# Patient Record
Sex: Male | Born: 1975
Health system: Southern US, Community
[De-identification: ages and names within clinical notes are randomized; demographics above are authoritative.]

## PROBLEM LIST (undated history)

## (undated) DIAGNOSIS — J45909 Unspecified asthma, uncomplicated: Secondary | ICD-10-CM

## (undated) DIAGNOSIS — K219 Gastro-esophageal reflux disease without esophagitis: Secondary | ICD-10-CM

## (undated) DIAGNOSIS — F319 Bipolar disorder, unspecified: Secondary | ICD-10-CM

## (undated) DIAGNOSIS — T8859XA Other complications of anesthesia, initial encounter: Secondary | ICD-10-CM

## (undated) DIAGNOSIS — M549 Dorsalgia, unspecified: Secondary | ICD-10-CM

## (undated) DIAGNOSIS — F419 Anxiety disorder, unspecified: Secondary | ICD-10-CM

## (undated) DIAGNOSIS — F259 Schizoaffective disorder, unspecified: Secondary | ICD-10-CM

## (undated) DIAGNOSIS — T4145XA Adverse effect of unspecified anesthetic, initial encounter: Secondary | ICD-10-CM

## (undated) DIAGNOSIS — R0689 Other abnormalities of breathing: Secondary | ICD-10-CM

## (undated) DIAGNOSIS — M5416 Radiculopathy, lumbar region: Secondary | ICD-10-CM

## (undated) DIAGNOSIS — N4400 Torsion of testis, unspecified: Secondary | ICD-10-CM

## (undated) DIAGNOSIS — E785 Hyperlipidemia, unspecified: Secondary | ICD-10-CM

## (undated) DIAGNOSIS — G43909 Migraine, unspecified, not intractable, without status migrainosus: Secondary | ICD-10-CM

## (undated) DIAGNOSIS — G8929 Other chronic pain: Secondary | ICD-10-CM

## (undated) HISTORY — PX: APPENDECTOMY: SHX54

## (undated) HISTORY — PX: ANKLE SURGERY: SHX546

## (undated) HISTORY — PX: UMBILICAL HERNIA REPAIR: SHX196

## (undated) HISTORY — DX: Other abnormalities of breathing: R06.89

## (undated) HISTORY — PX: CHOLECYSTECTOMY: SHX55

## (undated) HISTORY — DX: Schizoaffective disorder, unspecified: F25.9

---

## 2001-03-26 ENCOUNTER — Observation Stay (HOSPITAL_COMMUNITY): Admission: EM | Admit: 2001-03-26 | Discharge: 2001-03-27 | Payer: Self-pay | Admitting: General Surgery

## 2001-03-26 ENCOUNTER — Encounter: Payer: Self-pay | Admitting: General Surgery

## 2001-03-27 ENCOUNTER — Encounter: Payer: Self-pay | Admitting: General Surgery

## 2001-04-08 ENCOUNTER — Encounter: Payer: Self-pay | Admitting: Family Medicine

## 2001-04-08 ENCOUNTER — Ambulatory Visit (HOSPITAL_COMMUNITY): Admission: RE | Admit: 2001-04-08 | Discharge: 2001-04-08 | Payer: Self-pay | Admitting: Family Medicine

## 2001-04-14 ENCOUNTER — Encounter: Payer: Self-pay | Admitting: Family Medicine

## 2001-04-20 ENCOUNTER — Ambulatory Visit (HOSPITAL_COMMUNITY): Admission: RE | Admit: 2001-04-20 | Discharge: 2001-04-20 | Payer: Self-pay | Admitting: Family Medicine

## 2001-05-27 ENCOUNTER — Emergency Department (HOSPITAL_COMMUNITY): Admission: EM | Admit: 2001-05-27 | Discharge: 2001-05-27 | Payer: Self-pay | Admitting: Emergency Medicine

## 2001-09-05 ENCOUNTER — Emergency Department (HOSPITAL_COMMUNITY): Admission: EM | Admit: 2001-09-05 | Discharge: 2001-09-05 | Payer: Self-pay | Admitting: Emergency Medicine

## 2001-09-06 ENCOUNTER — Emergency Department (HOSPITAL_COMMUNITY): Admission: EM | Admit: 2001-09-06 | Discharge: 2001-09-06 | Payer: Self-pay

## 2001-10-05 ENCOUNTER — Inpatient Hospital Stay (HOSPITAL_COMMUNITY): Admission: EM | Admit: 2001-10-05 | Discharge: 2001-10-06 | Payer: Self-pay | Admitting: Emergency Medicine

## 2001-10-05 ENCOUNTER — Encounter: Payer: Self-pay | Admitting: Emergency Medicine

## 2001-10-12 ENCOUNTER — Emergency Department (HOSPITAL_COMMUNITY): Admission: EM | Admit: 2001-10-12 | Discharge: 2001-10-12 | Payer: Self-pay | Admitting: Emergency Medicine

## 2001-10-12 ENCOUNTER — Emergency Department (HOSPITAL_COMMUNITY): Admission: EM | Admit: 2001-10-12 | Discharge: 2001-10-13 | Payer: Self-pay | Admitting: Emergency Medicine

## 2001-10-13 ENCOUNTER — Inpatient Hospital Stay (HOSPITAL_COMMUNITY): Admission: EM | Admit: 2001-10-13 | Discharge: 2001-10-23 | Payer: Self-pay | Admitting: Emergency Medicine

## 2001-10-13 ENCOUNTER — Encounter: Payer: Self-pay | Admitting: Emergency Medicine

## 2001-10-15 ENCOUNTER — Encounter: Payer: Self-pay | Admitting: Family Medicine

## 2001-10-16 ENCOUNTER — Encounter: Payer: Self-pay | Admitting: Internal Medicine

## 2001-10-17 ENCOUNTER — Encounter: Payer: Self-pay | Admitting: Family Medicine

## 2001-10-24 ENCOUNTER — Inpatient Hospital Stay (HOSPITAL_COMMUNITY): Admission: EM | Admit: 2001-10-24 | Discharge: 2001-10-26 | Payer: Self-pay | Admitting: Emergency Medicine

## 2001-10-24 ENCOUNTER — Encounter: Payer: Self-pay | Admitting: Internal Medicine

## 2002-12-30 ENCOUNTER — Encounter: Payer: Self-pay | Admitting: *Deleted

## 2002-12-30 ENCOUNTER — Emergency Department (HOSPITAL_COMMUNITY): Admission: EM | Admit: 2002-12-30 | Discharge: 2002-12-30 | Payer: Self-pay | Admitting: Emergency Medicine

## 2003-01-04 ENCOUNTER — Emergency Department (HOSPITAL_COMMUNITY): Admission: EM | Admit: 2003-01-04 | Discharge: 2003-01-04 | Payer: Self-pay | Admitting: Emergency Medicine

## 2003-01-04 ENCOUNTER — Encounter: Payer: Self-pay | Admitting: Emergency Medicine

## 2007-03-05 ENCOUNTER — Encounter: Payer: Self-pay | Admitting: Orthopedic Surgery

## 2007-10-02 ENCOUNTER — Encounter: Payer: Self-pay | Admitting: Orthopedic Surgery

## 2007-12-23 ENCOUNTER — Encounter: Payer: Self-pay | Admitting: Orthopedic Surgery

## 2007-12-23 ENCOUNTER — Emergency Department (HOSPITAL_COMMUNITY): Admission: EM | Admit: 2007-12-23 | Discharge: 2007-12-23 | Payer: Self-pay | Admitting: Emergency Medicine

## 2007-12-31 ENCOUNTER — Ambulatory Visit: Payer: Self-pay | Admitting: Orthopedic Surgery

## 2008-01-07 ENCOUNTER — Ambulatory Visit: Payer: Self-pay | Admitting: Family Medicine

## 2008-01-07 DIAGNOSIS — J309 Allergic rhinitis, unspecified: Secondary | ICD-10-CM | POA: Insufficient documentation

## 2008-01-07 DIAGNOSIS — I10 Essential (primary) hypertension: Secondary | ICD-10-CM | POA: Insufficient documentation

## 2008-01-07 DIAGNOSIS — K589 Irritable bowel syndrome without diarrhea: Secondary | ICD-10-CM

## 2008-01-07 DIAGNOSIS — E782 Mixed hyperlipidemia: Secondary | ICD-10-CM

## 2008-01-07 DIAGNOSIS — F17200 Nicotine dependence, unspecified, uncomplicated: Secondary | ICD-10-CM

## 2008-01-07 DIAGNOSIS — F341 Dysthymic disorder: Secondary | ICD-10-CM | POA: Insufficient documentation

## 2008-01-07 LAB — CONVERTED CEMR LAB
Blood in Urine, dipstick: NEGATIVE
Ketones, urine, test strip: NEGATIVE
LDL Goal: 130 mg/dL
Nitrite: NEGATIVE
Protein, U semiquant: NEGATIVE
WBC Urine, dipstick: NEGATIVE

## 2008-01-08 LAB — CONVERTED CEMR LAB
AST: 24 units/L (ref 0–37)
Alkaline Phosphatase: 109 units/L (ref 39–117)
Basophils Absolute: 0 10*3/uL (ref 0.0–0.1)
Eosinophils Absolute: 0.3 10*3/uL (ref 0.0–0.7)
Eosinophils Relative: 3 % (ref 0–5)
Glucose, Bld: 103 mg/dL — ABNORMAL HIGH (ref 70–99)
HCT: 45.2 % (ref 39.0–52.0)
HDL: 31 mg/dL — ABNORMAL LOW (ref >39)
Hemoglobin: 15.3 g/dL (ref 13.0–17.0)
LDL Cholesterol: 136 mg/dL — ABNORMAL HIGH (ref 0–99)
MCV: 85.9 fL (ref 78.0–100.0)
Neutrophils Relative %: 63 % (ref 43–77)
Platelets: 225 10*3/uL (ref 150–400)
RBC: 5.26 M/uL (ref 4.22–5.81)
RDW: 13.8 % (ref 11.5–15.5)
Sodium: 139 meq/L (ref 135–145)
TSH: 1.461 microintl units/mL (ref 0.350–5.50)
Total Bilirubin: 0.6 mg/dL (ref 0.3–1.2)
Total Protein: 6.7 g/dL (ref 6.0–8.3)

## 2008-01-21 ENCOUNTER — Ambulatory Visit: Payer: Self-pay | Admitting: Family Medicine

## 2008-01-25 ENCOUNTER — Telehealth (INDEPENDENT_AMBULATORY_CARE_PROVIDER_SITE_OTHER): Payer: Self-pay | Admitting: *Deleted

## 2008-02-09 ENCOUNTER — Telehealth (INDEPENDENT_AMBULATORY_CARE_PROVIDER_SITE_OTHER): Payer: Self-pay | Admitting: Family Medicine

## 2008-02-12 ENCOUNTER — Ambulatory Visit (HOSPITAL_COMMUNITY): Admission: RE | Admit: 2008-02-12 | Discharge: 2008-02-12 | Payer: Self-pay | Admitting: Family Medicine

## 2008-02-12 ENCOUNTER — Ambulatory Visit: Payer: Self-pay | Admitting: Family Medicine

## 2008-02-12 LAB — CONVERTED CEMR LAB
Amylase: 45 units/L (ref 27–131)
Bilirubin Urine: NEGATIVE
CO2: 23 meq/L (ref 19–32)
Creatinine, Ser: 0.88 mg/dL (ref 0.40–1.50)
Eosinophils Relative: 1 % (ref 0–5)
Glucose, Bld: 122 mg/dL — ABNORMAL HIGH (ref 70–99)
HCT: 46.5 % (ref 39.0–52.0)
Ketones, urine, test strip: NEGATIVE
Lipase: 21 units/L (ref 11–59)
Lymphocytes Relative: 29 % (ref 12–46)
Lymphs Abs: 3.7 10*3/uL (ref 0.7–4.0)
Monocytes Absolute: 0.9 10*3/uL (ref 0.1–1.0)
OCCULT 1: NEGATIVE
Platelets: 263 10*3/uL (ref 150–400)
RDW: 14.1 % (ref 11.5–15.5)
Specific Gravity, Urine: <1.005
Total Bilirubin: 1.2 mg/dL (ref 0.3–1.2)
WBC: 12.6 10*3/uL — ABNORMAL HIGH (ref 4.0–10.5)

## 2008-02-15 ENCOUNTER — Ambulatory Visit: Payer: Self-pay | Admitting: Family Medicine

## 2008-02-16 ENCOUNTER — Telehealth (INDEPENDENT_AMBULATORY_CARE_PROVIDER_SITE_OTHER): Payer: Self-pay | Admitting: *Deleted

## 2008-03-03 ENCOUNTER — Ambulatory Visit: Payer: Self-pay | Admitting: Family Medicine

## 2008-03-04 ENCOUNTER — Ambulatory Visit: Payer: Self-pay | Admitting: Internal Medicine

## 2008-03-08 ENCOUNTER — Telehealth (INDEPENDENT_AMBULATORY_CARE_PROVIDER_SITE_OTHER): Payer: Self-pay | Admitting: *Deleted

## 2008-03-08 ENCOUNTER — Encounter (INDEPENDENT_AMBULATORY_CARE_PROVIDER_SITE_OTHER): Payer: Self-pay | Admitting: Family Medicine

## 2008-03-10 ENCOUNTER — Encounter (INDEPENDENT_AMBULATORY_CARE_PROVIDER_SITE_OTHER): Payer: Self-pay | Admitting: Family Medicine

## 2008-03-14 ENCOUNTER — Telehealth (INDEPENDENT_AMBULATORY_CARE_PROVIDER_SITE_OTHER): Payer: Self-pay | Admitting: *Deleted

## 2008-03-17 ENCOUNTER — Ambulatory Visit (HOSPITAL_COMMUNITY): Admission: RE | Admit: 2008-03-17 | Discharge: 2008-03-17 | Payer: Self-pay | Admitting: Internal Medicine

## 2008-03-17 ENCOUNTER — Ambulatory Visit: Payer: Self-pay | Admitting: Internal Medicine

## 2008-03-17 HISTORY — PX: ESOPHAGOGASTRODUODENOSCOPY: SHX1529

## 2008-03-23 ENCOUNTER — Ambulatory Visit (HOSPITAL_COMMUNITY): Admission: RE | Admit: 2008-03-23 | Discharge: 2008-03-23 | Payer: Self-pay | Admitting: Internal Medicine

## 2008-03-23 ENCOUNTER — Ambulatory Visit: Payer: Self-pay | Admitting: Family Medicine

## 2008-03-23 DIAGNOSIS — K449 Diaphragmatic hernia without obstruction or gangrene: Secondary | ICD-10-CM | POA: Insufficient documentation

## 2008-03-23 DIAGNOSIS — K210 Gastro-esophageal reflux disease with esophagitis: Secondary | ICD-10-CM

## 2008-03-29 ENCOUNTER — Telehealth (INDEPENDENT_AMBULATORY_CARE_PROVIDER_SITE_OTHER): Payer: Self-pay | Admitting: Family Medicine

## 2008-04-07 ENCOUNTER — Ambulatory Visit: Payer: Self-pay | Admitting: Internal Medicine

## 2008-04-07 ENCOUNTER — Encounter (INDEPENDENT_AMBULATORY_CARE_PROVIDER_SITE_OTHER): Payer: Self-pay | Admitting: Family Medicine

## 2008-04-15 ENCOUNTER — Emergency Department (HOSPITAL_COMMUNITY): Admission: EM | Admit: 2008-04-15 | Discharge: 2008-04-15 | Payer: Self-pay | Admitting: Emergency Medicine

## 2008-04-18 ENCOUNTER — Ambulatory Visit: Payer: Self-pay | Admitting: Family Medicine

## 2008-04-19 ENCOUNTER — Ambulatory Visit (HOSPITAL_COMMUNITY): Admission: RE | Admit: 2008-04-19 | Discharge: 2008-04-19 | Payer: Self-pay | Admitting: Family Medicine

## 2008-04-20 ENCOUNTER — Telehealth (INDEPENDENT_AMBULATORY_CARE_PROVIDER_SITE_OTHER): Payer: Self-pay | Admitting: *Deleted

## 2008-04-27 ENCOUNTER — Telehealth (INDEPENDENT_AMBULATORY_CARE_PROVIDER_SITE_OTHER): Payer: Self-pay | Admitting: Family Medicine

## 2008-05-20 ENCOUNTER — Encounter (INDEPENDENT_AMBULATORY_CARE_PROVIDER_SITE_OTHER): Payer: Self-pay | Admitting: Family Medicine

## 2008-05-27 ENCOUNTER — Ambulatory Visit: Payer: Self-pay | Admitting: Family Medicine

## 2008-05-27 DIAGNOSIS — Z8659 Personal history of other mental and behavioral disorders: Secondary | ICD-10-CM

## 2008-05-30 ENCOUNTER — Encounter (INDEPENDENT_AMBULATORY_CARE_PROVIDER_SITE_OTHER): Payer: Self-pay | Admitting: Family Medicine

## 2008-06-01 LAB — CONVERTED CEMR LAB
ALT: 34 units/L (ref 0–53)
AST: 18 units/L (ref 0–37)
CO2: 26 meq/L (ref 19–32)
Chloride: 100 meq/L (ref 96–112)
Creatinine, Ser: 0.84 mg/dL (ref 0.40–1.50)
HDL: 37 mg/dL — ABNORMAL LOW (ref >39)
LDL Cholesterol: 144 mg/dL — ABNORMAL HIGH (ref 0–99)
Sodium: 138 meq/L (ref 135–145)
Total CHOL/HDL Ratio: 6.4
VLDL: 55 mg/dL — ABNORMAL HIGH (ref 0–40)

## 2008-06-13 ENCOUNTER — Ambulatory Visit: Payer: Self-pay | Admitting: Family Medicine

## 2008-06-14 ENCOUNTER — Encounter (INDEPENDENT_AMBULATORY_CARE_PROVIDER_SITE_OTHER): Payer: Self-pay | Admitting: Family Medicine

## 2008-06-17 ENCOUNTER — Encounter (INDEPENDENT_AMBULATORY_CARE_PROVIDER_SITE_OTHER): Payer: Self-pay | Admitting: Family Medicine

## 2008-06-25 ENCOUNTER — Emergency Department (HOSPITAL_COMMUNITY): Admission: EM | Admit: 2008-06-25 | Discharge: 2008-06-25 | Payer: Self-pay | Admitting: Emergency Medicine

## 2008-07-01 ENCOUNTER — Telehealth (INDEPENDENT_AMBULATORY_CARE_PROVIDER_SITE_OTHER): Payer: Self-pay | Admitting: *Deleted

## 2008-07-07 ENCOUNTER — Emergency Department (HOSPITAL_COMMUNITY): Admission: EM | Admit: 2008-07-07 | Discharge: 2008-07-07 | Payer: Self-pay | Admitting: Emergency Medicine

## 2008-07-13 ENCOUNTER — Ambulatory Visit: Payer: Self-pay | Admitting: Family Medicine

## 2008-07-14 ENCOUNTER — Encounter (INDEPENDENT_AMBULATORY_CARE_PROVIDER_SITE_OTHER): Payer: Self-pay | Admitting: Family Medicine

## 2008-07-14 LAB — CONVERTED CEMR LAB
Alkaline Phosphatase: 123 units/L — ABNORMAL HIGH (ref 39–117)
Creatinine, Ser: 0.68 mg/dL (ref 0.40–1.50)
Glucose, Bld: 135 mg/dL — ABNORMAL HIGH (ref 70–99)
Sodium: 138 meq/L (ref 135–145)
Total Bilirubin: 0.5 mg/dL (ref 0.3–1.2)
Total Protein: 7.1 g/dL (ref 6.0–8.3)

## 2008-08-11 ENCOUNTER — Ambulatory Visit: Payer: Self-pay | Admitting: Family Medicine

## 2008-09-09 ENCOUNTER — Ambulatory Visit: Payer: Self-pay | Admitting: Family Medicine

## 2008-09-12 ENCOUNTER — Encounter (INDEPENDENT_AMBULATORY_CARE_PROVIDER_SITE_OTHER): Payer: Self-pay | Admitting: Family Medicine

## 2008-09-13 LAB — CONVERTED CEMR LAB
Albumin: 4.4 g/dL (ref 3.5–5.2)
Alkaline Phosphatase: 151 units/L — ABNORMAL HIGH (ref 39–117)
BUN: 9 mg/dL (ref 6–23)
Calcium: 9.1 mg/dL (ref 8.4–10.5)
Chloride: 103 meq/L (ref 96–112)
Cholesterol: 168 mg/dL (ref 0–200)
Creatinine, Ser: 0.73 mg/dL (ref 0.40–1.50)
Glucose, Bld: 101 mg/dL — ABNORMAL HIGH (ref 70–99)
HDL: 36 mg/dL — ABNORMAL LOW (ref >39)
Total Protein: 7.1 g/dL (ref 6.0–8.3)
Triglycerides: 414 mg/dL — ABNORMAL HIGH (ref <150)

## 2008-09-16 ENCOUNTER — Encounter: Payer: Self-pay | Admitting: Orthopedic Surgery

## 2008-09-16 ENCOUNTER — Ambulatory Visit (HOSPITAL_COMMUNITY): Admission: RE | Admit: 2008-09-16 | Discharge: 2008-09-16 | Payer: Self-pay | Admitting: Family Medicine

## 2008-09-16 ENCOUNTER — Ambulatory Visit: Payer: Self-pay | Admitting: Family Medicine

## 2008-09-16 DIAGNOSIS — K219 Gastro-esophageal reflux disease without esophagitis: Secondary | ICD-10-CM

## 2008-09-16 LAB — CONVERTED CEMR LAB
Amylase: 36 units/L (ref 27–131)
CO2: 22 meq/L (ref 19–32)
Calcium: 10.3 mg/dL (ref 8.4–10.5)
Chloride: 104 meq/L (ref 96–112)
Creatinine, Ser: 0.81 mg/dL (ref 0.40–1.20)
Glucose, Bld: 105 mg/dL — ABNORMAL HIGH (ref 70–99)
Lipase: 18 units/L (ref 11–59)
Total Bilirubin: 0.9 mg/dL (ref 0.3–1.2)

## 2008-09-20 ENCOUNTER — Ambulatory Visit: Payer: Self-pay | Admitting: Family Medicine

## 2008-09-21 LAB — CONVERTED CEMR LAB
Bilirubin, Direct: 0.1 mg/dL (ref 0.0–0.3)
Indirect Bilirubin: 0.3 mg/dL (ref 0.0–0.9)

## 2008-09-26 ENCOUNTER — Encounter: Payer: Self-pay | Admitting: Orthopedic Surgery

## 2008-09-26 ENCOUNTER — Telehealth (INDEPENDENT_AMBULATORY_CARE_PROVIDER_SITE_OTHER): Payer: Self-pay | Admitting: Family Medicine

## 2008-09-27 ENCOUNTER — Emergency Department (HOSPITAL_COMMUNITY): Admission: EM | Admit: 2008-09-27 | Discharge: 2008-09-27 | Payer: Self-pay | Admitting: Emergency Medicine

## 2008-09-27 ENCOUNTER — Telehealth: Payer: Self-pay | Admitting: Orthopedic Surgery

## 2008-09-28 ENCOUNTER — Encounter: Payer: Self-pay | Admitting: Orthopedic Surgery

## 2008-09-29 ENCOUNTER — Telehealth: Payer: Self-pay | Admitting: Orthopedic Surgery

## 2008-09-30 ENCOUNTER — Telehealth (INDEPENDENT_AMBULATORY_CARE_PROVIDER_SITE_OTHER): Payer: Self-pay | Admitting: *Deleted

## 2008-09-30 ENCOUNTER — Ambulatory Visit: Payer: Self-pay | Admitting: Family Medicine

## 2008-10-05 ENCOUNTER — Ambulatory Visit: Payer: Self-pay | Admitting: Orthopedic Surgery

## 2008-10-05 DIAGNOSIS — M7750 Other enthesopathy of unspecified foot: Secondary | ICD-10-CM

## 2008-10-11 ENCOUNTER — Telehealth (INDEPENDENT_AMBULATORY_CARE_PROVIDER_SITE_OTHER): Payer: Self-pay | Admitting: *Deleted

## 2008-10-14 ENCOUNTER — Encounter (INDEPENDENT_AMBULATORY_CARE_PROVIDER_SITE_OTHER): Payer: Self-pay | Admitting: *Deleted

## 2008-10-25 ENCOUNTER — Telehealth (INDEPENDENT_AMBULATORY_CARE_PROVIDER_SITE_OTHER): Payer: Self-pay | Admitting: *Deleted

## 2008-11-21 ENCOUNTER — Ambulatory Visit: Payer: Self-pay | Admitting: Family Medicine

## 2008-11-22 ENCOUNTER — Encounter: Payer: Self-pay | Admitting: Orthopedic Surgery

## 2009-01-09 ENCOUNTER — Telehealth (INDEPENDENT_AMBULATORY_CARE_PROVIDER_SITE_OTHER): Payer: Self-pay

## 2009-01-11 ENCOUNTER — Ambulatory Visit: Payer: Self-pay | Admitting: Family Medicine

## 2009-01-17 ENCOUNTER — Encounter: Payer: Self-pay | Admitting: Urgent Care

## 2009-01-17 ENCOUNTER — Encounter: Payer: Self-pay | Admitting: *Deleted

## 2009-01-17 ENCOUNTER — Ambulatory Visit: Payer: Self-pay | Admitting: Internal Medicine

## 2009-01-19 ENCOUNTER — Telehealth (INDEPENDENT_AMBULATORY_CARE_PROVIDER_SITE_OTHER): Payer: Self-pay | Admitting: Family Medicine

## 2009-02-01 ENCOUNTER — Telehealth (INDEPENDENT_AMBULATORY_CARE_PROVIDER_SITE_OTHER): Payer: Self-pay | Admitting: *Deleted

## 2009-02-17 ENCOUNTER — Ambulatory Visit: Payer: Self-pay | Admitting: Family Medicine

## 2009-02-17 DIAGNOSIS — F319 Bipolar disorder, unspecified: Secondary | ICD-10-CM

## 2009-02-20 ENCOUNTER — Telehealth (INDEPENDENT_AMBULATORY_CARE_PROVIDER_SITE_OTHER): Payer: Self-pay | Admitting: *Deleted

## 2009-02-28 ENCOUNTER — Emergency Department (HOSPITAL_COMMUNITY): Admission: EM | Admit: 2009-02-28 | Discharge: 2009-02-28 | Payer: Self-pay | Admitting: Emergency Medicine

## 2009-03-13 ENCOUNTER — Encounter (INDEPENDENT_AMBULATORY_CARE_PROVIDER_SITE_OTHER): Payer: Self-pay | Admitting: Family Medicine

## 2009-03-14 LAB — CONVERTED CEMR LAB
BUN: 9 mg/dL (ref 6–23)
CO2: 24 meq/L (ref 19–32)
Cholesterol: 231 mg/dL — ABNORMAL HIGH (ref 0–200)
Creatinine, Ser: 0.81 mg/dL (ref 0.40–1.50)
Glucose, Bld: 83 mg/dL (ref 70–99)
Potassium: 4.5 meq/L (ref 3.5–5.3)
Total Bilirubin: 0.5 mg/dL (ref 0.3–1.2)
Total CHOL/HDL Ratio: 5.6
Triglycerides: 210 mg/dL — ABNORMAL HIGH (ref <150)
VLDL: 42 mg/dL — ABNORMAL HIGH (ref 0–40)

## 2009-03-15 ENCOUNTER — Ambulatory Visit: Payer: Self-pay | Admitting: Family Medicine

## 2009-03-20 ENCOUNTER — Ambulatory Visit: Payer: Self-pay | Admitting: Family Medicine

## 2009-03-20 DIAGNOSIS — M545 Low back pain: Secondary | ICD-10-CM

## 2009-03-21 ENCOUNTER — Telehealth (INDEPENDENT_AMBULATORY_CARE_PROVIDER_SITE_OTHER): Payer: Self-pay | Admitting: Family Medicine

## 2009-03-28 ENCOUNTER — Ambulatory Visit: Payer: Self-pay | Admitting: Family Medicine

## 2009-03-31 ENCOUNTER — Telehealth (INDEPENDENT_AMBULATORY_CARE_PROVIDER_SITE_OTHER): Payer: Self-pay | Admitting: Family Medicine

## 2009-04-07 ENCOUNTER — Telehealth (INDEPENDENT_AMBULATORY_CARE_PROVIDER_SITE_OTHER): Payer: Self-pay | Admitting: Family Medicine

## 2009-04-11 ENCOUNTER — Emergency Department (HOSPITAL_COMMUNITY): Admission: EM | Admit: 2009-04-11 | Discharge: 2009-04-11 | Payer: Self-pay | Admitting: Emergency Medicine

## 2009-04-20 ENCOUNTER — Encounter (INDEPENDENT_AMBULATORY_CARE_PROVIDER_SITE_OTHER): Payer: Self-pay | Admitting: Family Medicine

## 2009-04-26 ENCOUNTER — Ambulatory Visit: Payer: Self-pay | Admitting: Family Medicine

## 2009-04-26 DIAGNOSIS — G4700 Insomnia, unspecified: Secondary | ICD-10-CM

## 2009-05-31 ENCOUNTER — Emergency Department (HOSPITAL_COMMUNITY): Admission: EM | Admit: 2009-05-31 | Discharge: 2009-05-31 | Payer: Self-pay | Admitting: Emergency Medicine

## 2009-06-04 ENCOUNTER — Emergency Department (HOSPITAL_COMMUNITY): Admission: EM | Admit: 2009-06-04 | Discharge: 2009-06-04 | Payer: Self-pay | Admitting: Emergency Medicine

## 2009-06-07 ENCOUNTER — Other Ambulatory Visit: Payer: Self-pay | Admitting: Emergency Medicine

## 2009-06-08 ENCOUNTER — Inpatient Hospital Stay (HOSPITAL_COMMUNITY): Admission: RE | Admit: 2009-06-08 | Discharge: 2009-06-19 | Payer: Self-pay | Admitting: Psychiatry

## 2009-06-08 ENCOUNTER — Ambulatory Visit: Payer: Self-pay | Admitting: Psychiatry

## 2009-06-23 ENCOUNTER — Emergency Department (HOSPITAL_COMMUNITY): Admission: EM | Admit: 2009-06-23 | Discharge: 2009-06-23 | Payer: Self-pay | Admitting: Emergency Medicine

## 2009-07-04 ENCOUNTER — Encounter: Admission: RE | Admit: 2009-07-04 | Discharge: 2009-07-04 | Payer: Self-pay | Admitting: Orthopedic Surgery

## 2009-07-21 ENCOUNTER — Emergency Department (HOSPITAL_COMMUNITY): Admission: EM | Admit: 2009-07-21 | Discharge: 2009-07-21 | Payer: Self-pay | Admitting: Family Medicine

## 2009-07-26 ENCOUNTER — Ambulatory Visit (HOSPITAL_BASED_OUTPATIENT_CLINIC_OR_DEPARTMENT_OTHER): Admission: RE | Admit: 2009-07-26 | Discharge: 2009-07-26 | Payer: Self-pay | Admitting: Orthopedic Surgery

## 2009-08-04 ENCOUNTER — Encounter: Payer: Self-pay | Admitting: Orthopedic Surgery

## 2009-09-07 ENCOUNTER — Encounter: Payer: Self-pay | Admitting: Orthopedic Surgery

## 2009-10-21 ENCOUNTER — Emergency Department (HOSPITAL_COMMUNITY): Admission: EM | Admit: 2009-10-21 | Discharge: 2009-10-21 | Payer: Self-pay | Admitting: Emergency Medicine

## 2009-11-02 ENCOUNTER — Emergency Department (HOSPITAL_COMMUNITY): Admission: EM | Admit: 2009-11-02 | Discharge: 2009-11-02 | Payer: Self-pay | Admitting: Emergency Medicine

## 2009-11-05 ENCOUNTER — Emergency Department (HOSPITAL_COMMUNITY): Admission: EM | Admit: 2009-11-05 | Discharge: 2009-11-05 | Payer: Self-pay | Admitting: Emergency Medicine

## 2009-11-18 ENCOUNTER — Inpatient Hospital Stay (HOSPITAL_COMMUNITY): Admission: AC | Admit: 2009-11-18 | Discharge: 2009-11-19 | Payer: Self-pay

## 2009-11-20 ENCOUNTER — Emergency Department (HOSPITAL_COMMUNITY): Admission: EM | Admit: 2009-11-20 | Discharge: 2009-11-20 | Payer: Self-pay | Admitting: Emergency Medicine

## 2009-11-21 ENCOUNTER — Emergency Department (HOSPITAL_COMMUNITY): Admission: EM | Admit: 2009-11-21 | Discharge: 2009-11-21 | Payer: Self-pay | Admitting: Emergency Medicine

## 2009-11-24 ENCOUNTER — Emergency Department (HOSPITAL_COMMUNITY): Admission: EM | Admit: 2009-11-24 | Discharge: 2009-11-24 | Payer: Self-pay | Admitting: Emergency Medicine

## 2009-12-12 ENCOUNTER — Ambulatory Visit: Payer: Self-pay | Admitting: Psychiatry

## 2009-12-12 ENCOUNTER — Inpatient Hospital Stay (HOSPITAL_COMMUNITY): Admission: AD | Admit: 2009-12-12 | Discharge: 2009-12-15 | Payer: Self-pay | Admitting: Psychiatry

## 2009-12-12 ENCOUNTER — Other Ambulatory Visit: Payer: Self-pay | Admitting: Emergency Medicine

## 2010-01-19 ENCOUNTER — Emergency Department (HOSPITAL_COMMUNITY): Admission: EM | Admit: 2010-01-19 | Discharge: 2010-01-19 | Payer: Self-pay | Admitting: Emergency Medicine

## 2010-01-28 ENCOUNTER — Emergency Department (HOSPITAL_COMMUNITY): Admission: EM | Admit: 2010-01-28 | Discharge: 2010-01-29 | Payer: Self-pay | Admitting: Emergency Medicine

## 2010-02-02 ENCOUNTER — Encounter: Admission: RE | Admit: 2010-02-02 | Discharge: 2010-02-02 | Payer: Self-pay | Admitting: Orthopedic Surgery

## 2010-02-05 ENCOUNTER — Emergency Department (HOSPITAL_COMMUNITY): Admission: EM | Admit: 2010-02-05 | Discharge: 2010-02-05 | Payer: Self-pay | Admitting: Emergency Medicine

## 2010-03-03 ENCOUNTER — Emergency Department (HOSPITAL_COMMUNITY): Admission: EM | Admit: 2010-03-03 | Discharge: 2010-03-03 | Payer: Self-pay | Admitting: Emergency Medicine

## 2010-03-06 ENCOUNTER — Emergency Department (HOSPITAL_COMMUNITY): Admission: EM | Admit: 2010-03-06 | Discharge: 2010-03-06 | Payer: Self-pay | Admitting: Family Medicine

## 2010-04-12 ENCOUNTER — Emergency Department (HOSPITAL_COMMUNITY): Admission: EM | Admit: 2010-04-12 | Discharge: 2010-04-12 | Payer: Self-pay | Admitting: Family Medicine

## 2010-04-22 ENCOUNTER — Emergency Department (HOSPITAL_COMMUNITY): Admission: EM | Admit: 2010-04-22 | Discharge: 2010-04-22 | Payer: Self-pay | Admitting: Emergency Medicine

## 2010-04-23 ENCOUNTER — Encounter: Payer: Self-pay | Admitting: Orthopedic Surgery

## 2010-05-09 ENCOUNTER — Encounter: Payer: Self-pay | Admitting: Orthopedic Surgery

## 2010-06-06 ENCOUNTER — Encounter: Payer: Self-pay | Admitting: Orthopedic Surgery

## 2010-07-10 ENCOUNTER — Encounter: Payer: Self-pay | Admitting: Orthopedic Surgery

## 2010-08-09 ENCOUNTER — Encounter: Payer: Self-pay | Admitting: Orthopedic Surgery

## 2010-09-12 ENCOUNTER — Emergency Department (HOSPITAL_COMMUNITY): Admission: EM | Admit: 2010-09-12 | Discharge: 2010-09-12 | Payer: Self-pay | Admitting: Emergency Medicine

## 2010-09-19 ENCOUNTER — Encounter: Payer: Self-pay | Admitting: Orthopedic Surgery

## 2010-09-20 ENCOUNTER — Encounter: Payer: Self-pay | Admitting: Orthopedic Surgery

## 2010-09-22 ENCOUNTER — Emergency Department (HOSPITAL_COMMUNITY): Admission: EM | Admit: 2010-09-22 | Discharge: 2010-09-22 | Payer: Self-pay | Admitting: Emergency Medicine

## 2010-11-15 ENCOUNTER — Emergency Department (HOSPITAL_COMMUNITY)
Admission: EM | Admit: 2010-11-15 | Discharge: 2010-11-15 | Payer: Self-pay | Source: Home / Self Care | Admitting: Emergency Medicine

## 2010-12-13 NOTE — Consult Note (Signed)
Summary: Consult report from Dr. Ethelene Hal  Consult report from Dr. Ethelene Hal   Imported By: Jacklynn Ganong 05/21/2010 08:50:27  _____________________________________________________________________  External Attachment:    Type:   Image     Comment:   External Document

## 2010-12-13 NOTE — Letter (Signed)
Summary: [name] office note Dr [facility] office note Dr Ethelene Hal   Imported By: Cammie Sickle 08/10/2010 21:58:19  _____________________________________________________________________  External Attachment:    Type:   Image     Comment:   External Document

## 2010-12-13 NOTE — Consult Note (Signed)
Summary: Consult note from Dr. Ethelene Hal  Consult note from Dr. Ethelene Hal   Imported By: Jacklynn Ganong 09/24/2010 10:00:54  _____________________________________________________________________  External Attachment:    Type:   Image     Comment:   External Document

## 2010-12-13 NOTE — Consult Note (Signed)
Summary: Consult note from Dr. Ethelene Hal  Consult note from Dr. Ethelene Hal   Imported By: Jacklynn Ganong 09/20/2010 08:08:52  _____________________________________________________________________  External Attachment:    Type:   Image     Comment:   External Document

## 2010-12-13 NOTE — Consult Note (Signed)
Summary: Consult note/Dr .Ethelene Hal  Consult note/Dr .Ethelene Hal   Imported By: Jacklynn Ganong 07/10/2010 11:52:09  _____________________________________________________________________  External Attachment:    Type:   Image     Comment:   External Document

## 2010-12-13 NOTE — Consult Note (Signed)
Summary: Consult note from Dr. Ethelene Hal  Consult note from Dr. Ethelene Hal   Imported By: Jacklynn Ganong 06/12/2010 13:48:36  _____________________________________________________________________  External Attachment:    Type:   Image     Comment:   External Document

## 2010-12-13 NOTE — Consult Note (Signed)
Summary: Consult report from Dr. Ethelene Hal  Consult report from Dr. Ethelene Hal   Imported By: Jacklynn Ganong 05/01/2010 15:35:45  _____________________________________________________________________  External Attachment:    Type:   Image     Comment:   External Document

## 2011-01-22 LAB — CBC
HCT: 39.7 % (ref 39.0–52.0)
Hemoglobin: 13.8 g/dL (ref 13.0–17.0)
Hemoglobin: 13.9 g/dL (ref 13.0–17.0)
MCHC: 36.3 g/dL — ABNORMAL HIGH (ref 30.0–36.0)
MCV: 92.2 fL (ref 78.0–100.0)
Platelets: 232 10*3/uL (ref 150–400)
RBC: 4.31 MIL/uL (ref 4.22–5.81)
RDW: 12.3 % (ref 11.5–15.5)
WBC: 6.3 10*3/uL (ref 4.0–10.5)

## 2011-01-22 LAB — POCT I-STAT, CHEM 8
Glucose, Bld: 106 mg/dL — ABNORMAL HIGH (ref 70–99)
HCT: 41 % (ref 39.0–52.0)
Hemoglobin: 13.9 g/dL (ref 13.0–17.0)
Potassium: 2.9 mEq/L — ABNORMAL LOW (ref 3.5–5.1)

## 2011-01-22 LAB — URINALYSIS, ROUTINE W REFLEX MICROSCOPIC
Hgb urine dipstick: NEGATIVE
Nitrite: NEGATIVE
Specific Gravity, Urine: 1.017 (ref 1.005–1.030)
Urobilinogen, UA: 1 mg/dL (ref 0.0–1.0)

## 2011-01-22 LAB — SALICYLATE LEVEL: Salicylate Lvl: <4 mg/dL (ref 2.8–20.0)

## 2011-01-22 LAB — DIFFERENTIAL
Basophils Absolute: 0 10*3/uL (ref 0.0–0.1)
Basophils Absolute: 0 10*3/uL (ref 0.0–0.1)
Basophils Relative: 0 % (ref 0–1)
Basophils Relative: 1 % (ref 0–1)
Eosinophils Absolute: 0.3 10*3/uL (ref 0.0–0.7)
Monocytes Absolute: 0.6 10*3/uL (ref 0.1–1.0)
Monocytes Relative: 7 % (ref 3–12)
Neutro Abs: 6.2 10*3/uL (ref 1.7–7.7)
Neutro Abs: 2.9 10*3/uL (ref 1.7–7.7)
Neutrophils Relative %: 69 % (ref 43–77)
Neutrophils Relative %: 46 % (ref 43–77)

## 2011-01-22 LAB — COMPREHENSIVE METABOLIC PANEL
ALT: 31 U/L (ref 0–53)
Albumin: 3.6 g/dL (ref 3.5–5.2)
Alkaline Phosphatase: 74 U/L (ref 39–117)
BUN: 7 mg/dL (ref 6–23)
Chloride: 108 mEq/L (ref 96–112)
Glucose, Bld: 88 mg/dL (ref 70–99)
Potassium: 4.1 mEq/L (ref 3.5–5.1)
Sodium: 143 mEq/L (ref 135–145)
Total Bilirubin: 0.8 mg/dL (ref 0.3–1.2)

## 2011-01-22 LAB — RAPID URINE DRUG SCREEN, HOSP PERFORMED
Barbiturates: NOT DETECTED
Opiates: NOT DETECTED

## 2011-01-22 LAB — POCT CARDIAC MARKERS
CKMB, poc: <1 ng/mL — ABNORMAL LOW (ref 1.0–8.0)
CKMB, poc: <1 ng/mL — ABNORMAL LOW (ref 1.0–8.0)
Troponin i, poc: <0.05 ng/mL (ref 0.00–0.09)

## 2011-01-22 LAB — APTT: aPTT: 38 seconds — ABNORMAL HIGH (ref 24–37)

## 2011-01-26 LAB — COMPREHENSIVE METABOLIC PANEL
ALT: 43 U/L (ref 0–53)
Alkaline Phosphatase: 70 U/L (ref 39–117)
BUN: 6 mg/dL (ref 6–23)
CO2: 27 mEq/L (ref 19–32)
Chloride: 105 mEq/L (ref 96–112)
GFR calc non Af Amer: >60 mL/min (ref >60)
Glucose, Bld: 69 mg/dL — ABNORMAL LOW (ref 70–99)
Potassium: 4.1 mEq/L (ref 3.5–5.1)
Sodium: 138 mEq/L (ref 135–145)
Total Bilirubin: 0.7 mg/dL (ref 0.3–1.2)
Total Protein: 7.2 g/dL (ref 6.0–8.3)

## 2011-01-26 LAB — PROTIME-INR: Prothrombin Time: 13.2 seconds (ref 11.6–15.2)

## 2011-01-26 LAB — URINALYSIS, ROUTINE W REFLEX MICROSCOPIC
Nitrite: NEGATIVE
Specific Gravity, Urine: 1.008 (ref 1.005–1.030)
Urobilinogen, UA: 1 mg/dL (ref 0.0–1.0)

## 2011-01-26 LAB — CBC
HCT: 44.8 % (ref 39.0–52.0)
Hemoglobin: 15.4 g/dL (ref 13.0–17.0)
RBC: 4.93 MIL/uL (ref 4.22–5.81)
RDW: 13.7 % (ref 11.5–15.5)

## 2011-01-26 LAB — SAMPLE TO BLOOD BANK

## 2011-01-29 LAB — URINALYSIS, ROUTINE W REFLEX MICROSCOPIC
Hgb urine dipstick: NEGATIVE
Protein, ur: NEGATIVE mg/dL
Urobilinogen, UA: 0.2 mg/dL (ref 0.0–1.0)

## 2011-01-30 LAB — CBC
MCV: 90 fL (ref 78.0–100.0)
RBC: 4.7 MIL/uL (ref 4.22–5.81)
WBC: 8.1 10*3/uL (ref 4.0–10.5)

## 2011-01-30 LAB — RAPID URINE DRUG SCREEN, HOSP PERFORMED
Amphetamines: NOT DETECTED
Barbiturates: NOT DETECTED

## 2011-01-30 LAB — DIFFERENTIAL
Eosinophils Absolute: 0.1 10*3/uL (ref 0.0–0.7)
Lymphocytes Relative: 20 % (ref 12–46)
Lymphs Abs: 1.6 10*3/uL (ref 0.7–4.0)
Monocytes Relative: 7 % (ref 3–12)
Neutro Abs: 5.8 10*3/uL (ref 1.7–7.7)
Neutrophils Relative %: 71 % (ref 43–77)

## 2011-01-30 LAB — POCT I-STAT, CHEM 8
BUN: 7 mg/dL (ref 6–23)
Chloride: 107 mEq/L (ref 96–112)
Creatinine, Ser: 0.6 mg/dL (ref 0.4–1.5)
Glucose, Bld: 123 mg/dL — ABNORMAL HIGH (ref 70–99)
Potassium: 3.5 mEq/L (ref 3.5–5.1)

## 2011-02-15 LAB — BASIC METABOLIC PANEL
BUN: 8 mg/dL (ref 6–23)
CO2: 25 mEq/L (ref 19–32)
Chloride: 103 mEq/L (ref 96–112)
Glucose, Bld: 104 mg/dL — ABNORMAL HIGH (ref 70–99)
Potassium: 4.2 mEq/L (ref 3.5–5.1)

## 2011-02-16 LAB — URINALYSIS, ROUTINE W REFLEX MICROSCOPIC
Glucose, UA: NEGATIVE mg/dL
Protein, ur: NEGATIVE mg/dL
Specific Gravity, Urine: 1.013 (ref 1.005–1.030)

## 2011-02-17 LAB — CBC
HCT: 42.2 % (ref 39.0–52.0)
HCT: 42.3 % (ref 39.0–52.0)
MCHC: 34.5 g/dL (ref 30.0–36.0)
MCV: 89.7 fL (ref 78.0–100.0)
Platelets: 219 10*3/uL (ref 150–400)
Platelets: 231 10*3/uL (ref 150–400)
RBC: 4.7 MIL/uL (ref 4.22–5.81)
RDW: 14.1 % (ref 11.5–15.5)
WBC: 6.6 10*3/uL (ref 4.0–10.5)

## 2011-02-17 LAB — DIFFERENTIAL
Basophils Absolute: 0.1 10*3/uL (ref 0.0–0.1)
Eosinophils Relative: 5 % (ref 0–5)
Lymphocytes Relative: 25 % (ref 12–46)
Lymphocytes Relative: 29 % (ref 12–46)
Lymphs Abs: 1.9 10*3/uL (ref 0.7–4.0)
Monocytes Absolute: 0.8 10*3/uL (ref 0.1–1.0)
Neutro Abs: 6.9 10*3/uL (ref 1.7–7.7)
Neutrophils Relative %: 58 % (ref 43–77)

## 2011-02-17 LAB — COMPREHENSIVE METABOLIC PANEL
Albumin: 4.1 g/dL (ref 3.5–5.2)
BUN: 11 mg/dL (ref 6–23)
Chloride: 108 mEq/L (ref 96–112)
Creatinine, Ser: 0.75 mg/dL (ref 0.4–1.5)
Total Bilirubin: 0.4 mg/dL (ref 0.3–1.2)
Total Protein: 6.7 g/dL (ref 6.0–8.3)

## 2011-02-17 LAB — HEPATIC FUNCTION PANEL
ALT: 132 U/L — ABNORMAL HIGH (ref 0–53)
Albumin: 4.1 g/dL (ref 3.5–5.2)
Alkaline Phosphatase: 86 U/L (ref 39–117)
Total Protein: 7 g/dL (ref 6.0–8.3)

## 2011-02-17 LAB — POCT I-STAT, CHEM 8
BUN: 13 mg/dL (ref 6–23)
Calcium, Ion: 1.01 mmol/L — ABNORMAL LOW (ref 1.12–1.32)
Creatinine, Ser: 1.1 mg/dL (ref 0.4–1.5)
TCO2: 23 mmol/L (ref 0–100)

## 2011-02-17 LAB — RAPID URINE DRUG SCREEN, HOSP PERFORMED
Amphetamines: NOT DETECTED
Barbiturates: NOT DETECTED
Opiates: POSITIVE — AB

## 2011-03-18 ENCOUNTER — Inpatient Hospital Stay (INDEPENDENT_AMBULATORY_CARE_PROVIDER_SITE_OTHER)
Admission: RE | Admit: 2011-03-18 | Discharge: 2011-03-18 | Disposition: A | Discharge status: Home / Self Care | Payer: Medicare Other | Source: Ambulatory Visit | Attending: Emergency Medicine | Admitting: Emergency Medicine

## 2011-03-18 DIAGNOSIS — S335XXA Sprain of ligaments of lumbar spine, initial encounter: Secondary | ICD-10-CM

## 2011-03-26 NOTE — H&P (Signed)
NAME:  [name], [name] NO.:  [id number]   MEDICAL RECORD NO.:  [id number]          PATIENT TYPE:  IPS   LOCATION:  0407                          FACILITY:  [facility]   PHYSICIAN:  Anselm Jungling, MD  DATE OF BIRTH:  08/06/1976   DATE OF ADMISSION:  06/08/2009  DATE OF DISCHARGE:                       PSYCHIATRIC ADMISSION ASSESSMENT   This is a 35 year old male voluntarily admitted on June 08, 2009.   HISTORY OF PRESENT ILLNESS:  Patient presents with a history of auditory  hallucinations, feeling very depressed, and having symptoms of anxiety.  He states he stopped his medications because of side effects.  He denies  any substance use and patient is currently homeless.   PAST PSYCHIATRIC HISTORY:  First admission to [facility].  Sees Dr. Thomasena Edis for outpatient mental health services.  In the past,  has been on Risperdal and Haldol.   SOCIAL HISTORY:  Patient is disabled and homeless.   FAMILY HISTORY:  None.   ALCOHOL AND DRUG HISTORY:  Denies any alcohol or substance use.   PRIMARY CARE Jaiya Mooradian:  Dr. Delbert Harness.   MEDICAL PROBLEMS:  History of hypertension.   MEDICATIONS:  Listed are:  1. Seroquel XR 200 mg at h.s.  2. Trazodone 300 mg at h.s..  3. Nabumetone 750 mg p.r.n. b.i.d.  4. Metoprolol 50 mg b.i.d.  5. Prevacid 30 mg daily.  6. Trilipix 135 mg daily.   DRUG ALLERGIES:  1. COMPAZINE.  2. RISPERDAL.   PHYSICAL EXAM:  Was reviewed with no significant findings.  This is a  well-nourished, well-developed male.  Again, he appears in no distress  and he offers no complaints.   LABORATORY DATA:  Shows a salicylate level less than 4.  Acetaminophen  level less than 10.  Urine drug screen is positive for benzodiazepines.  Alcohol level less than 5.  Potassium of 3.3.  CBC within normal limits.   MENTAL STATUS EXAM:  Patient is resting at this time.  He is cooperative  with good eye contact, pleasant, and is a good historian.  His  speech is  clear, normal pace and tone.  Patient is endorsing auditory  hallucinations although this does not appear to be actively responding  at this time.  His thought processes are coherent and goal directed.  Cognitive function appears intact.  Memory appears intact.  Again, he is  a good historian.   AXIS I:  Depressive disorder NOS, rule out psychosis.  AXIS II:  Deferred.  AXIS III:  Hypertension.  AXIS IV:  Problems with housing, other psychosocial problems.  AXIS V:  Current is 30.   PLAN:  Contract for safety.  We will review medications.  We will have  Haldol available on a p.r.n. basis for psychotic symptoms.  Case manager  will assess his living situation and follow up.   TENTATIVE LENGTH OF STAY AT THIS TIME:  Three to 5 days.      Landry Corporal, N.P.      Anselm Jungling, MD  Electronically Signed    JO/MEDQ  D:  06/16/2009  T:  06/16/2009  Job:  [phone] [id number]

## 2011-03-26 NOTE — Discharge Summary (Signed)
NAME:  PRESTIN, MUNCH NO.:  [id number]   MEDICAL RECORD NO.:  [id number]          PATIENT TYPE:  IPS   LOCATION:  0407                          [facility]ILITY[facility]  [facility]   PHYSICIAN:  Anselm Jungling, MD  DATE OF BIRTH:  02-Mar-1976   DATE OF ADMISSION:  06/08/2009  DATE OF DISCHARGE:  06/19/2009                               DISCHARGE SUMMARY   IDENTIFYING DATA/REASON FOR ADMISSION:  This was an inpatient  psychiatric admission for [name], a 35 year old male who was  admitted to the inpatient psychiatric service due to increasing  depression, suicidal ideation, accompanied by auditory hallucinations.  Please refer to the admission note for further details pertaining to the  symptoms, circumstances and history that led to his hospitalization.  He  was given an initial Axis I diagnosis of suppressive disorder NOS, rule  out psychosis NOS.   MEDICAL AND LABORATORY:  The patient was medically and physically  assessed by the psychiatric nurse practitioner.  He was in good without  any active or chronic medical problems, but did have chronic pain after  which he was given lidocaine patch, Skelaxin for muscle relaxation,  Relafen, and Neurontin.  He also had history of hypertension and was  given Lopressor for this, and GERD was treated with his usual Prevacid.  There were no acute medical issues.   HOSPITAL COURSE:  The patient was admitted to the adult inpatient  psychiatric service and is a well-nourished, large male who is alert,  fully oriented, pleasant, polite, open, and easily engageable.  He was a  good historian.  His thoughts were well-organized.  There were no  apparent delusions.  His mood was cheerful, with a good affective range.  There was no active suicidal ideation, plan or intent.  He reported  history of intolerance to Seroquel and Risperdal, but a positive  response in the past to Lexapro.  He verbalized a strong desire for  help.   He was treated  with a psychotropic regimen that included Zyprexa,  Cymbalta, and these were well tolerated.  He participate in therapeutic  groups and activities and was a good participant.   He worked closely with case manager towards an aftercare plan that was  enacted on the tenth hospital day, at which time the patient appeared  appropriate for discharge.  Placement had been arranged for him at the  [facility] at that time of discharge.  He was very pleased  with this disposition.   AFTERCARE:  The patient was to follow-up at [facility] and with Dr.  Allyne Gee at [facility] with an appointment on August 9  at 11:30 a.m.   DISCHARGE MEDICATIONS:  1. Lopressor 50 mg b.i.d.  2. Trilipix 135 mg daily.  3. Prevacid 30 mg daily.  4. Trazodone 300 mg q.h.s.  5. Zyprexa 15 mg q.h.s.  6. Cymbalta 60 mg daily.  7. Neurontin 400 mg q.i.d.  8. Relafen 750 mg p.r.n. to b.i.d.  9. Skelaxin 800 mg q.i.d. p.r.n. muscle spasm.  10.Ambien 10 mg q.h.s.  11.Lidoderm patch 5% to infected area 12 hours out of 24.  The patient was referred back to his medical physician for ongoing care  of his general medical issues.  He was instructed to stop taking  Seroquel.   DISCHARGE DIAGNOSES:  AXIS I:  History of psychosis NOS, history of mood  disorder NOS, pain disorder NOS.  AXIS II:  Deferred.  AXIS III:  Chronic pain, hypertension, GERD.  AXIS IV:  Severe.  AXIS V:  GAF on discharge 55.      Anselm Jungling, MD  Electronically Signed     SPB/MEDQ  D:  06/26/2009  T:  06/26/2009  Job:  [phone] [id number]

## 2011-03-26 NOTE — Assessment & Plan Note (Signed)
NAME:  [name], [name]            CHART#:  [id number]   DATE:  04/07/2008                       DOB:  November 18, 1975   CHIEF COMPLAINT:  Follow up from EGD with Dr. Jena Gauss.   PROBLEM LIST:  1. Mild erosive reflux esophagitis/complicated gastroesophageal reflux      disease.  Small hiatal hernia as evidenced on an EGD Mar 17, 2008 by      Dr. Jena Gauss.  2. Splenomegaly as evidenced on abdominal ultrasound Mar 23, 2008.  3. Schizophrenia.  4. Childhood Encopresis.  5. Hypertension.  6. Hypercholesterolemia.  7. Depression.  8. Right ankle surgery.  9. Status post cholecystectomy.  10.Status post appendectomy.  11.Status post umbilical herniorrhaphy.  12.Status post pilonidal cyst excision in 1996.  13.Status post right inguinal herniorrhaphy.  14.Status post circumcision.  15.Chronic constipation.   SUBJECTIVE:  [name] is a 35 year old Caucasian male who has a history  of chronic GERD and reflux esophagitis.  He was having some epigastric  pain.  This has pretty much resolved.  He continues to complain of a mid  abdominal pain which happens every day or every other day.  He denies  rectal bleeding or melena. He notes that the pain is worse after eating.  He is on Protonix 40 mg b.i.d. this seems to help with heartburn and  indigestion, but does not seem to help as far as he has postprandial  pain.  It generally occurs between 15 or 30 minutes after eating.  He  does go several days without a bowel movement.  He rates pain 8/10 at  worst.  He has some nausea with this, but denies any vomiting, denies  any rectal bleeding or melena.  He has not tried anything for  constipation, despite going several days without a bowel movement, other  than lactulose which he takes on a p.r.n. basis.  He had mildly elevated  LFTs with an alkaline phosphatase of 132, ALT of 70 previously.  However, most recent LFTs from Mar 17, 2008 were normal, as well as  amylase and lipase.   CURRENT MEDICATIONS:   See the list from Apr 07, 2008.   ALLERGIES:  COMPAZINE and RISPERDAL.   OBJECTIVE:  VITAL SIGNS:  Weight 344.5 pounds.  Height 74 inches,  temperature 98, blood pressure 110/70, and pulse 80.  GENERAL:  [name] is a morbidly obese Caucasian male who is alert,  pleasant, and cooperative in no acute distress.  HEENT:  Normal sclerae not icteric.  Conjunctivae pink.  Oropharynx pink  and moist without any lesions.  HEART:  Regular rhythm.  Normal S1-S2, without murmurs, rubs, thrills,  or gallops.  ABDOMEN:  Protuberant.  Positive bowel sounds x4.  No bruits  auscultated.  Soft, nontender, nondistended, without palpable mass or  hepatosplenomegaly.  No intentional guarding.  EXTREMITIES:  Without clubbing or edema bilaterally.   ASSESSMENT:  1. [name] is a 35 year old male with erosive reflux esophagitis.  2. He has chronic constipation.  3. Splenomegaly.  4. Postprandial epigastric pain which could be related to sphincter of      Oddi dysfunction given his bump in transaminases previously with      pain.  I would like him to have LFTs checked about 6 hours after      the onset of his next bout of severe postprandial pain.  PLAN:  1. LFTs 6 hours after the onset of pain.  I have given him a      prescription for this to take either to the lab or the hospital.  2. Constipation literature given for his review.  3. Discontinue lactulose and begin MiraLax 17 g daily, for any      problems.  4. Begin fiber supplement of choice.  5. Continue Protonix 40 mg b.i.d.  6. He is requesting pain medication today; however, I have asked him      to call or either go directly to the emergency room when he has      another bout of severe pain, as we are still trying to determine      the culprit of these episodes of severe pain with transaminitis.      He verbalizes understanding and will call if severe pain returns.       Lorenza Burton, N.P.  Electronically Signed     R. Roetta Sessions, M.D.  Electronically Signed    KJ/MEDQ  D:  04/07/2008  T:  04/07/2008  Job:  [id number]   cc:   Franchot Heidelberg, M.D.

## 2011-03-26 NOTE — Op Note (Signed)
NAME:  [name], [name]           ACCOUNT NO.:  [id number]   MEDICAL RECORD NO.:  [id number]          PATIENT TYPE:  AMB   LOCATION:  DAY                           FACILITY:  [facility]   PHYSICIAN:  R. Roetta Sessions, M.D. DATE OF BIRTH:  June 24, 1976   DATE OF PROCEDURE:  03/17/2008  DATE OF DISCHARGE:                               OPERATIVE REPORT   INDICATIONS FOR PROCEDURE:  A 35 year old gentleman with recent  postprandial epigastric pain and refractory reflux symptoms, although  now his reflux symptoms are well-controlled with Protonix 40 mg orally  twice daily.  He continues to have postprandial epigastric pain, which  he liken to the symptoms leading up to cholecystectomy several years  ago.  He has mildly elevated liver enzymes.  EGD is now being done.  This approach has discussed with the patient at length.  Potential  risks, benefits, alternatives, and limitations have been reviewed  previously and again at the bedside.  Please see documentation in the  medical record .   PROCEDURE NOTE:  O2 saturation, blood pressure, pulse, and respirations  were monitored throughout the entirety of the procedure.   CONSCIOUS SEDATION:  Versed 7 mg IV, Demerol 175 mg IV, and Phenergan  12.5 mg IV diluted slow IV push to augment conscious sedation.  Cetacaine sprayed for topical pharyngeal anesthesia.   INSTRUMENT:  Pentax video chip system.   FINDINGS:  Examination of the tubular esophagus revealed a couple of  tiny distal esophageal erosions, otherwise, esophageal mucosa appeared  normal.  EG junction easily traversed.  Stomach:  All gastric cavity was  emptied and insufflated well with air.  Throughout examination, the  gastric mucosa including retroflexion of the proximal stomach  esophagogastric junction demonstrated only a small hiatal hernia and  minimal polypoid folds in the antrum.  There was no evidence of  infiltrating process, ulcer, or other abnormality.  Pylorus was patent  and easily traversed.  Examination of the bulb, second portion revealed  no abnormalities.   THERAPEUTIC/DIAGNOSTIC MANEUVERS PERFORMED:  None.   The patient tolerated the procedure well as reactive in the endoscopy.   IMPRESSION:  A tiny distal esophageal erosions consistent with mild  erosive reflux esophagitis, otherwise normal esophagus, small hiatal  hernia, minimally polypoid antral mucosa with doubtful clinical was  significance.  Otherwise, normal stomach, patent pylorus, and normal D1-  D2.   RECOMMENDATION:  1. Continue Protonix 40 mg orally twice daily.  Literature on      gastroesophageal reflux disease provided to Mr.      Keith.  2. Proceed with right upper quadrant ultrasound, repeat hepatic      profile, serum amylase and lipase today.  Further recommendations      to follow.      [name], M.D.  Electronically Signed     RMR/MEDQ  D:  03/17/2008  T:  03/18/2008  Job:  [id number]   cc:   Franchot Heidelberg, M.D.

## 2011-03-26 NOTE — Consult Note (Signed)
NAME:  Charles [name], Charles [name]           ACCOUNT NO.:  [id number]   MEDICAL RECORD NO.:  [id number]          PATIENT TYPE:  AMB   LOCATION:  DAY                           FACILITY:  [facility]   PHYSICIAN:  R. Roetta Sessions, M.D. DATE OF BIRTH:  1976/01/31   DATE OF CONSULTATION:  DATE OF DISCHARGE:                                 CONSULTATION   __________  Charles [name]. Rourk, MD   REASON FOR CONSULTATION:  Epigastric pain postprandially refractory  indigestion.   HISTORY OF PRESENT ILLNESS:  Charles [name] is a 35 year old Caucasian male.  He has a 3-week history of upper abdominal or epigastric cramping.  He  describes the pain is immediately postprandially along with nausea.  He  denies any vomiting.  He does take Reglan and this does seem to help.  He complains of indigestion, as well as heartburn.  He has been on  Protonix 40 mg daily.  This was changed from Prilosec 20 mg b.i.d. as it  had quick __________working for him.  He takes lactulose p.r.n. for  constipation.  He can go up to a week without a bowel movement, but he  has been doing well on the lactulose and having a bowel movement at  least every day or so.  He denies any rectal bleeding or melena.  He  rarely consumes alcohol.  He uses rare NSAID.   He had mildly elevated LFTs with an alk phos of 139,  ALT of 70.  On  February 12, 2008, he had a CT scan of the abdomen and pelvis with contrast,  which showed no acute abdominal or pelvic findings.   PAST MEDICAL AND SURGICAL HISTORY:  1. GERD.  He had an EGD by Dr. Dionicia [name] on August 26, 1997.  He was      found to have erosive reflux esophagitis with small sliding hiatal      hernia.  2. He has history of encopresis as a child.  3. Schizophrenia.  4. Hypertension.  5. Hypercholesterolemia.  6. Depression.  7. Right ankle surgery.  8. He is status post cholecystectomy.  9. Appendectomy.  10.Umbilical herniorrhaphy.  11.Pilonidal cyst excision in December 1996.  12.He had a right  inguinal herniorrhaphy and circumcision.   CURRENT MEDICATIONS:  1. Amlodipine 10 mg daily.  2. Reglan 10 mg q.6 h.  3. Trazodone 100 mg nightly.  4. Benztropine 1 mg q.a.m., 2 mg q.p.m.  5. Wellbutrin 300 mg daily.  6. Xanax 0.5 mg b.i.d.  7. Metoprolol 50 mg b.i.d.  8. Lactulose 2 tablespoons p.r.n.  9. Ultram 50 mg p.r.n.  10.Protonix 40 mg daily.  11.Haldol 150 mg monthly.   ALLERGIES:  1. COMPAZINE.  2. RISPERDAL.   FAMILY HISTORY:  Positive for a grandfather with colon cancer.  No first-  degree relatives.  Mother has hypertension.  Father has had an MI and  coronary artery disease.  He has three healthy siblings.   SOCIAL HISTORY:  Charles [name] is divorced.  He lives alone.  He had been  living in [place] for the last 5 years, but moved back in February  2009.  He is disabled .  He has an 18-pack-year history of tobacco use,  consumes one beer per month or so, and denies any drug use.   REVIEW OF SYSTEMS:  See HPI otherwise negative.   PHYSICAL EXAMINATION:  VITAL SIGNS:  Weight 340 pounds, height 74  inches, temperature 97.7, blood pressure 120/84, pulse 88.  GENERAL:  Charles [name] is a morbidly obese Caucasian male who is alert,  oriented, pleasant, cooperative, and in no acute distress.  HEENT:  Pupils equal, round, and reactive to light.  Conjunctivae pink.  Oropharynx pink and moist without any lesions.  NECK:  Supple without mass or thyromegaly.  CHEST:  Regular rate and rhythm.  Normal S1 and S2, without murmurs,  clicks, rubs or gallops.  LUNGS:  Clear to auscultation bilaterally.  ABDOMEN:  Protuberant with positive bowel sounds x4.  No bruits  auscultated.  Soft, nontender without palpable mass or  hepatosplenomegaly.  Exam is limited given the patient's body habitus.  EXTREMITIES:  Without clubbing or edema bilaterally.  SKIN:  Pink, warm, and dry.  He does have multiple tattoos on bilateral  upper extremities.   LABORATORY STUDIES:  From February 12, 2008,  showed a white blood cell count  of 12.5, hemoglobin 16.4, hematocrit 46.5, platelet count 263.  Sodium  135, potassium 4, chloride 103, CO2 of 23, glucose 122, BUN 6,  creatinine 0.88.  Total bilirubin 1.2, alkaline phosphatase 139, AST 28,  ALT 70, total protein 7, albumin 4.4, calcium 9.7, amylase 45, lipase  21.   IMPRESSION:  Charles [name] is a 35 year old male with a 3-week history of  postprandial epigastric pain and significant indigestion.  I suspect he  may have erosive reflux esophagitis as the culprit of his symptoms.  He  is status post cholecystectomy.  There is no evidence of pancreatitis  given his CT and lab work.  He has a mildly elevated alkaline  phosphatase and ALT and we may need to consider abdominal ultrasound for  further imaging of his biliary tree and liver pending EGD findings.   PLAN:  1. Increase Protonix to 40 mg b.i.d.  2. EGD with Dr. Jena [name] in the near future.  We discussed this procedure      including risks, benefits which include but are not limited to      bleeding, infection, perforation, or drug reaction.  He agrees with      the plan and consent      will be obtained.  3. May consider repeating LFTs and/or abdominal ultrasound pending EGD      findings.   Thank you Dr. Erby Keith for allowing [place] to participate in the care of Mr.  [name].      Lorenza Burton, N.P.      Jonathon Bellows, M.D.  Electronically Signed    KJ/MEDQ  D:  03/04/2008  T:  03/05/2008  Job:  [id number]

## 2011-03-29 NOTE — Discharge Summary (Signed)
[facility]  Patient:    [name], [name] Visit Number: [id number] MRN: [id number]          Service Type: MED Location: 3A A308 01 Attending Physician:  John Giovanni Dictated by:   John Giovanni, M.D. Admit Date:  10/05/2001 Discharge Date: 10/06/2001                             Discharge Summary  HISTORY OF PRESENT ILLNESS: This 35 year old man was admitted with viral gastroenteritis.  HOSPITAL COURSE:  He had a benign two day hospitalization extending from October 05, 2001 to October 06, 2001.  He had a normal CBC and repeat normal the morning after admission.  His chest and abdominal films were negative.  He was admitted with a good deal of abdominal pain, which cleared while in the hospital.  Treatment included IV normal saline 125 cc an hour, Pepcid 20 mg IV q.24h, and Flagyl 500 mg IV q8h.  He was also given IV Phenergan and Demerol p.r.n. and Ambien 10 mg for sleep.  He was kept on clear liquids.  He did well on this regimen and by 24 hours was feeling much better, still with some occasional nausea but was actually taking liquids well by mouth and was hungry, and his diarrhea totally stopped his second day.  FINAL DISCHARGE DIAGNOSES:  1.  Viral gastroenteritis.  2.  Chronic ankle sprain.  DISCHARGE MEDICATIONS:  1.  Phenergan 25 mg one to two q.4h p.r.n. (#30, no refills.  Call to refill      pharmacy by M.D.).  2.  He also requested:  (Review of the record in the office showed that these      had all been called in to [place] one month earlier, the      end of October 2002.  The patient claimed he did not pick these up).      a.  Lortab.  (#30 Lorcet Plus, two refills, to be used t.i.d. maximum          dose).      b.  Naproxen 500 mg b.i.d. (#30, two refills).      c.  Alprazolam 0.5 mg t.i.d. maximum dose (#30 with one refill).  He was not sent home on any antibiotics or antacid drugs given it was  felt that most of this was just a virus.  FOLLOW-UP: Follow-up is to be in the office as necessary and note to return to work the Monday following discharge, which will be October 12, 2001. Arrangements were made by our staff for him to see Dr. Hilda Lias for his chronic ankle problems. Dictated by:   John Giovanni, M.D. Attending Physician:  John Giovanni DD:  10/06/01 TD:  10/07/01 Job: [id number]

## 2011-03-29 NOTE — Discharge Summary (Signed)
[place]. [facility]  Patient:    [name], [name] Visit Number: [id number] MRN: [id number]          Service Type: MED Location: 3000 3031 01 Attending Physician:  Boneta Lucks Dictated by:   Luanna Cole. Lenord Fellers, M.D. Admit Date:  10/13/2001 Discharge Date: 10/23/2001   CC:         Madie Reno A. Dub Mikes, M.D.  Florencia Reasons, M.D.  Carolyne Fiscal, M.D.   Discharge Summary  FINAL DIAGNOSES: 1. Multidrug overdose. 2. Depression. 3. Mild esophagitis with sliding hiatal hernia. 4. Personality disorder.  CONDITION ON DISCHARGE:  Stable.  DISCHARGE MEDICATIONS: 1. Protonix 40 mg q.d. 2. Carafate suspension 1 gram p.o. t.i.d. on empty stomach between meals.    Carafate is not to be administered with any other medication, however.  HISTORY OF PRESENT ILLNESS:  This 35 year old white male, native of [place], [place], was discharged to [facility] October 23, 2001, after a 10-day stay for abdominal pain and hematemesis.  He underwent an extensive GI workup including a flexible sigmoidoscopy that was negative and an upper endoscopy that showed only mild esophagitis with one or two discreet erosions in the esophagus along with a sliding hiatal hernia.  He had no active bleeding and no evidence of peptic ulcer disease.  His gastric mucosa and pylorus were normal.  He was slow to improve, although it did not appear that he was all that ill.  He continued to complain frequently of abdominal pain during his initial hospitalization which was done by Carolyne Fiscal, M.D. who was on call for unassigned patients.  He had a very poor social situation.  He stated he was estranged from his family and had really no where to go.  Social worker was involved with discharge planning.  His discharge medications on October 23, 2001, were Vicodin, #15, (5 mg hydrocodone and 500 mg Tylenol), Phenergan 6 tablets, Protonix 6 tablets 40 mg,  and approximately 6 _____ tablets.  The patient reports that around 4 p.m. on October 24, 2001, he took all of these medications.  Subsequently he called an ambulance to the local motel where he was staying for transport to the emergency department.  He states took the overdose because he "just gave up".  He states that during his recent hospitalization that he lost his job and that his family did not care about him.  He states his family did not come to see him during his recent hospitalization.  It is not clear to me what has caused this estrangement.  He has several family members residing in the [place] area.  He formerly resided with his grandfather, but says he cannot go back there.  We did leave a message on his mothers home answering machine to call, but she did not return the call.  The patient was seen in the emergency department and his vital signs were stable.  His blood pressure was 150/70, he was tachycardic at 110 when I saw him, but initially when he arrived in the emergency department his pulse was listed as 78.  Respiratory rate was 20.  Complained of dizziness and nausea. Initial acetaminophen level was 31 and repeat level in four hours was 7.  All other lab work was essentially within normal limits.  Urine drug screen was positive for opiates.  Urine tricyclic screen was negative.  Pulse oximetry on room air was 99%.  HOSPITAL COURSE:  The patient was admitted overnight to the medical  intensive care unit.  He remained stable throughout his entire hospital course.  On the afternoon of October 25, 2001, he was seen by Madie Reno A. Dub Mikes, M.D., psychiatrist, for psychiatric evaluation.  The patient has no medical insurance.  Dr. Dub Mikes felt it would be best if the patient would agree to go to [facility] for voluntary commitment.  The patient said he was willing to do "whatever it takes".  The patient did ask to smoke a cigarette and was accompanied by myself and house  coveryage to smoke a cigarette.  He seemed very cooperative.  He was complaining of early in the day, October 25, 2001, of some vague abdominal pain and nausea.  He was given p.o. Phenergan.  He drank full liquids during the entire day without any problems whatsoever.  No vomiting noted.  He seemed to be in no acute distress.  Subsequently IV Protonix was discontinued, and he was placed on p.o. Protonix and Carafate suspension 1 gram p.o. t.i.d.  Intravenous fluids were discontinued and heparin lock was remained in place.  Transfer/ commitment papers were completed by Dr. Dub Mikes and his associates from the [facility].  He does have repeat labs pending at 5:30 p.m., October 25, 2001, including liver functions, pro time, and acetaminophen level.  This was done at the recommendation of poison control in [place]. It is anticipated that all of this will be normal since his four-hour Tylenol level was quite low at 7.  he is felt to be medically stable for discharge. His complaint of abdominal pain seems to have a considerable amount of emotional overlay compared to what the findings were on upper endoscopy.  The patient seems to exhibit some features of borderline personality versus Munchausen syndrome.  He is obviously depressed.  Therefore, he is being transferred via sheriffs department minivan to [facility] for further evaluation upon the recommendation of psychiatrist. Dictated by:   Luanna Cole. Lenord Fellers, M.D. Attending Physician:  Boneta Lucks DD:  10/25/01 TD:  10/25/01 Job: [phone] [id number]

## 2011-03-29 NOTE — H&P (Signed)
[facility] [facility]  Patient:    [name], [name] Visit Number: [id number] MRN: [id number]          Service Type: MED Location: 3A A308 01 Attending Physician:  John Giovanni Dictated by:   John Giovanni, M.D. Admit Date:  10/05/2001 Discharge Date: 10/06/2001                           History and Physical  CHIEF COMPLAINT:  This 35 year old presented to the emergency room this afternoon with complaint of nausea and vomiting.  HISTORY OF PRESENT ILLNESS:  He also is having stomach cramps and a blackish diarrhea.  He had been seen in the office about a month prior to admission.  At that time he had some pain in his ankle, felt to be possibly secondary to chronic sprain.  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  He has sprained them a number of times and he said the ligaments pop with intermittent pain from these.  ALLERGIES:  He gave a history of allergy to:  1.  COMPAZINE.  2.  IMITREX.  CURRENT MEDICATIONS: York Spaniel he was not taking any.  PHYSICAL EXAMINATION:  GENERAL: Admission examination showed a pleasant, obese 53ish year old man, who walked in the ER.  He appeared to be oriented and alert, well-developed, and well-nourished.  VITAL SIGNS:  Temperature 99 degrees, pulse 103, respiratory rate 20, blood pressure 148/66.  HEART: Regular rate and rhythm, about 90 supine.  LUNGS:  Appear clear throughout.  ABDOMEN:  Soft, without hepatosplenomegaly or masses but diffuse tenderness. Bowel sounds appeared to be normal.  EXTREMITIES:  No edema of the ankles.  LABORATORY DATA:  CBC and MET 7 were normal.  Acute abdominal x-rays were negative.  There are surgical clips in the right upper quadrant consistent with prior cholecystectomy.  Chest x-ray negative.  ADMISSION DIAGNOSIS:  Viral gastroenteritis.  Given the history of liquid blackish colored  stool plus Hemoccult positivity, there is concern he might also have some bleeding source as well.  PLAN:  He is admitted with IV fluids, will be put on Flagyl 500 mg t.i.d., Pepcid IV, and repeat CBC and MET 7 in the morning. Dictated by:   John Giovanni, M.D. Attending Physician:  John Giovanni DD:  10/06/01 TD:  10/07/01 Job: [id number]

## 2011-03-29 NOTE — H&P (Signed)
[place]. [facility]  Patient:    [name], [name] Visit Number: [id number] MRN: [id number]          Service Type: MED Location: 3000 3031 01 Attending Physician:  Boneta Lucks Dictated by:   Luanna Cole. Lenord Fellers, M.D. Admit Date:  10/13/2001 Discharge Date: 10/23/2001   CC:         Madie Reno A. Dub Mikes, M.D.  Carolyne Fiscal, M.D.  Florencia Reasons, M.D.   History and Physical  CHIEF COMPLAINT:  "I gave up."  HISTORY OF PRESENT ILLNESS:  This 35 year old white male native of [place], [place] (unassigned patient), just discharged yesterday from this facility after a 10 day admission for abdominal pain and hematemesis presented today by ambulance with apparent multi-drug overdose.  The patient had been hospitalized for a history of hematemesis and upper abdominal pain.  He had upper endoscopy by the [facility] on 10/13/01, showing only one or two discrete erosions in the esophagus along with a sliding hiatal hernia.  The gastric mucosa and pylorus were normal.  The patient was treated with Protonix and Carafate.  He was not felt to be all that ill, but had a lot of somatic complaints and was slow to improve.  He had a very poor social situation.  The patient stated that he was estranged from his family.  He said that he really had no where to go, and the social worker came and explored options with him. Apparently, a nun from [place]. [employer] came by to visit him, making regular hospital visits, found out about his situation and offered to pay for a motel room on the evening of discharge.  He spent the evening of 10/23/01, in a local motel.  Around 4 p.m., he apparently took multiple pills that had been prescribed for him on the day of discharge.  The hospital had filled for him 15 Vicodin tablets (5 mg hydrocodone and 500 mg of Tylenol), 6 Phenergan tablets, 6 Protonix tablets, and approximately 6 Bentyl tablets.  The  patient was alert and awake upon arrival to the emergency department.  He called an ambulance and asked for help.  When I saw him, he was complaining of being dizzy and nauseated.  He had received activated charcoal given by the emergency department physician.  His blood pressure was stable at 150/70, he was tachycardic, pulse 110 and regular, respiratory rate was 20.  He says that during the time that he was in the hospital from 12/3 until 10/23/01, that no one in his family came to visit him, but they were aware of his admission.  It is not clear to me why this estrangement has developed.  He does have two sisters, but says he does not speak with them either.  Apparently, previously resided with his grandfather in the [place] area, and also says he stayed with various other individuals.  Says that during the recent hospitalization that he lost his job.  Says he worked with a company that [employer] se[employer].  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  The patient says that he had a previous suicide attempt at age 28 in high school.  He says he took quinine sulfate tablets.  Says he was hospitalized at [facility] at that time, and subsequently at [facility] here in [place].  Initially, he did not want me to contact his mother regarding his whereabouts.  He did not want her to know about the overdose.  We talked about this for a while, and he did finally consent for me to call her, and let her know that he was here in the hospital.  He felt like that she would still think that he was hospitalized thinking that she did not know of his discharge.  I did contact his mother, [name], by telephone, given the next of kin information in the hospital chart.  A message was left on her home answering machine to call me, but my call was never returned.  Behavioral health was consulted  regarding psychiatric consultation.  They will see the patient probably tomorrow.  PAST MEDICAL HISTORY: 1. Cholecystectomy. 2. Appendectomy. 3. Says he has a history of migraine headaches.  ALLERGIES:  IMITREX, COMPAZINE, that these cause rashes.  SOCIAL HISTORY:  Says he smokes approximately 1/2 pack of cigarettes per day, but I think previously gave the history that he smoked about 1 pack of cigarettes daily.  Denies alcohol consumption, and his alcohol level was 0.  Urine drug screen was positive for opiates.  One of his discharge medications was Vicodin.  Tricyclics were not detected in his urine.  FAMILY HISTORY:  Please see previously dictated history and physical examination by Dr. Mosetta Putt dated 10/13/01.  PHYSICAL EXAMINATION:  VITAL SIGNS:  Temperature 98.7 degrees orally, blood pressure 151/71, pulse 110, respiratory rate 20, pulse oximetry 99% on room air.  SKIN:  Warm and dry.  He has multiple tattoos.  NODES:  None.  HEENT:  Unremarkable, except for charcoal noted on his tongue.  NECK:  Supple, no thyromegaly.  CHEST:  Clear.  CARDIAC:  Tachycardia, regular rate and rhythm, normal S1 and S2 without murmurs or gallops.  ABDOMEN:  Benign.  No organosplenomegaly, masses, or significant tenderness.  EXTREMITIES:  Without edema.  NEUROLOGIC:  He is alert and awake.  Cranial nerves II-XII grossly intact.  He has no gross focal deficits on brief exam.  He is cognitive of the overdose, and says he was simply depressed and gave up, and that is why he took the overdose.  IMPRESSION: 1. Multi-drug overdose. 2. Depression. 3. Mild esophagitis. 4. ? borderline personality versus Munchausens syndrome.  PLAN:  The patient will be admitted to the intensive care unit for close observation, and a repeat acetaminophen level will be obtained in four hours.  I have spoken with Poison Control in [place], and if acetaminophen (Tylenol) level is greater than 150  in four hours, then he will need to be started on Mucomyst.  He will be given IV fluids, cardiac monitoring will be conducted. He will be given IV Protonix.  The patient is complaining now of abdominal pain, but I told him we were not able to give him any pain medication under these circumstances.  He understands this. Dictated by:   Luanna Cole. Lenord Fellers, M.D. Attending Physician:  Boneta Lucks DD:  10/25/01 TD:  10/25/01 Job: [id number]

## 2011-03-29 NOTE — Procedures (Signed)
[place]. [facility]  Patient:    [name], [name] Visit Number: [id number] MRN: [id number]          Service Type: MED Location: 3000 3031 01 Attending Physician:  Boneta Lucks Dictated by:   Florencia Reasons, M.D. Proc. Date: 10/19/01 Admit Date:  10/13/2001   CC:         Carolyne Fiscal, M.D.   Procedure Report  PROCEDURE PERFORMED:  Flexible sigmoidoscopy.  ENDOSCOPIST:  Florencia Reasons, M.D.  INDICATIONS FOR PROCEDURE:  This 35 year old gentleman has been in the hospital about a week with abdominal pain and food intolerance.  Repeat CT scanning performed over the weekend showed evidence of sigmoid thickening and he has had some diarrhea, so sigmoidoscopic correlation was felt to be appropriate.  FINDINGS:  Normal exam to 60 cm.  DESCRIPTION OF PROCEDURE:  The risks of the procedure had been discussed with the patient, who provided written consent.  Sedation was requested by the patient for this brief procedure and totaled fentanyl 100 mcg and Versed 10 mg IV.  Despite this large amount of medication, the patient showed absolutely no signs of sedation whatsoever, indicating substantial medication tolerance.  Digital exam of the prostate was unremarkable.  The Olympus pediatric video colonoscope was advanced quite easily to 60 cm.  The procedure was done unprepped.  There was a moderate amount of semiformed but not ____________ stool scattered in the sigmoid region and throughout the length of the exam but it was not particularly difficult to get past it and by and large the colonic mucosal surface could be visualized fairly well and it is felt that no diffuse abnormality would have been missed.  This was a normal examination.  No polyps, colitis, vascular malformations or diverticular disease were observed.  Retroflexion was not performed in the rectum but antegrade viewing disclosed no distal rectal lesions.  No  biopsies were obtained.  The patient experienced quite a bit of abdominal pain, probably crampy in character following the procedure despite the fact that the scope had been able to be quite easily.  This raises the question of perhaps enhanced visceral nociception as is sometime seen in IBS patients.  PLAN:  Continue to advance diet as tolerated.  I do not see any indication for further evaluation of the colon. Dictated by:   Florencia Reasons, M.D. Attending Physician:  Boneta Lucks DD:  10/19/01 TD:  10/19/01 Job: [id number]

## 2011-03-29 NOTE — Group Therapy Note (Signed)
   NAME:  WAYLEN, DEPAOLO                     ACCOUNT NO.:  [id number]   MEDICAL RECORD NO.:  [id number]                   PATIENT TYPE:  EMS   LOCATION:  ED                                   FACILITY:  [facility]   PHYSICIAN:  Edward L. Juanetta Gosling, M.D.             DATE OF BIRTH:  1976-10-30   DATE OF PROCEDURE:  DATE OF DISCHARGE:  01/04/2003                                   PROGRESS NOTE   Rhythm was sinus rhythm.  The rate was 70.  There were two waves inferiorly  which could indicate a previous inferior myocardial infarction.  There are  nonspecific ST-T wave changes.  Abnormal electrocardiogram.                                               Oneal Deputy. Juanetta Gosling, M.D.    ELH/MEDQ  D:  01/04/2003  T:  01/05/2003  Job:  [id number]

## 2011-03-29 NOTE — Consult Note (Signed)
[place]. [facility]  Patient:    [name], [name] Visit Number: [id number] MRN: [id number]          Service Type: MED Location: [phone] Attending Physician:  Lonia Blood Dictated by:   Florencia Reasons, M.D. Proc. Date: 10/14/01 Admit Date:  10/13/2001   CC:         Carolyne Fiscal, M.D.   Consultation Report  Dr. Duaine Dredge asked [place] to see this 35 year old gentleman because of abdominal pain and reported hematemesis.  HISTORY:  [name] is being admitted through the emergency room this evening as an unassigned patient to Dr. Duaine Dredge.  Interestingly, because of recent ER visits for similar complaints over the past week, he had already been scheduled to see my partner, Dr. Dorena Cookey, in the office and an appointment had been scheduled for some time in the next couple of weeks.  [name] has no prior history of similar symptoms.  He did have his gallbladder out at age 35 but the current symptoms are not similar.  The current symptoms have been going on for about a week and consistent of upper abdominal pain primarily in the right upper quadrant fairly persistent, made worse by food with periodic exacerbations, relieved by narcotic analgesics during ER visits recently.  During this period of time the patient has had, by his report, coffee ground emesis and melanic stools and apparently was Hemoccult positive at the [facility] Emergency Room, although I do not have confirmation of that and he is currently Hemoccult negative.  A CT scan of the abdomen and pelvis obtained yesterday was negative for any significant abnormalities.  ALLERGIES:  COMPAZINE, IMITREX (rash).  MEDICATIONS:  None.  OPERATIONS:  Appendectomy age 37, cholecystectomy age 45 (apparently no stones were present, rather, it was a poorly functioning gallbladder for which the surgery was done).  PAST MEDICAL HISTORY:  History of migraines.  SOCIAL HISTORY:   Habits:  Smokes about one pack per day.  Nondrinker.  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  FAMILY HISTORY:  Negative for gallbladder disease, liver disease, inflammatory bowel disease, colon cancer, or ulcers.  REVIEW OF SYSTEMS:  Apart from the past week, the patients main GI symptom is post prandial diarrhea.  There is no history of prodromal weight loss, no problem with chronic abdominal pain or anorexia.  I do not believe that there is any ongoing dyspeptic symptomatology.  PHYSICAL EXAMINATION  VITAL SIGNS:  Normal.  GENERAL:  The patient has multiple tattoos.  He is lying on the ER stretcher in no evident distress whatsoever.  HEENT:  Anicteric.  CHEST:  Clear to auscultation.  HEART:  No gallops, rubs, murmurs, clicks, or arrhythmias.  A soft systolic murmur, I believe, is present at the upper left sternal border.  ABDOMEN:  Moderately obese.  Sparse bowel sounds.  No palpable hepatosplenomegaly.  Rather diffuse, especially right-sided, guarding and subjective tenderness mildly to moderately impressive, seemingly fairly reproducible, albeit pressure with the stethoscope is better tolerated than manual pressure.  RECTAL:  Brown heme-negative stool by Dr. Duaine Dredge and was not repeated.  LABORATORIES:  White count 8900, hemoglobin 13.9 with an MCV of 85, platelets 205,000.  Chemistry panel:  Normal except for potassium 3.9 and albumin 3.3. Interestingly, BUN is 5 just by reported intolerance to oral intake.  Liver chemistries, amylase, and lipase are all normal.  IMPRESSION: 1. A one week history of rather diffuse abdominal pain  with some degree of    tenderness on examination, but negative amylase, lipase, white count, and    CT scan of the abdomen. 2. Reported hematemesis and melena, although currently Hemoccult negative and    normal hemoglobin.  No history of ulcerogenic medication exposure recently.  DISCUSSION:  I am  really at a loss to explain the patients current symptoms. I wonder about H. pylori induced peptic ulcer disease.  PLAN: 1. High dose Protonix for now. 2. Upper endoscopy later today.  The nature, purpose, and risks were reviewed    with the patient and he is agreeable to proceed, aware of the fact that it    will probably be one of my partners rather than myself doing the    examination. Dictated by:   Florencia Reasons, M.D. Attending Physician:  Jetty Duhamel T DD:  10/14/01 TD:  10/14/01 Job: [id number]

## 2011-03-29 NOTE — H&P (Signed)
[place]. [facility]  Patient:    [name], [name] Visit Number: [id number] MRN: [id number]          Service Type: MED Location: [phone] Attending Physician:  Lonia Blood Dictated by:   Carolyne Fiscal, M.D. Admit Date:  10/13/2001                           History and Physical  CHIEF COMPLAINT: Abdominal pain and vomiting.  HISTORY OF PRESENT ILLNESS: This 35 year old white male unassigned patient had the onset eight days ago of epigastric/abdominal pain associated with melena. He went to the hospital in [place], [place], where he was admitted for less than 24 hours.  He was given IV fluids and went home.  He attempted to tolerate solid foods but had recurrent episodes about twice a day of hematemesis.  His bowel movements returned to normal and without further evidence of blood or melena.  Approximately 48 hours ago he developed increased epigastric and right upper quadrant abdominal pain associated with increased amounts of hematemesis.  He presented to [employer] Emergency Room and was given intravenous fluids, Phenergan, and was discharged on Tylox and Protonix.  A follow-up outpatient appointment was made for GI consultation with Dr. Madilyn Fireman in approximately two weeks.  The patient has had no known fever.  There is no history of alcohol use and he does not take aspirin or NSAIDs.  There is no prior history of similar episode.  He had a cholecystectomy done at age 35.  PAST MEDICAL HISTORY: Negative except for the above and migraine headaches every few months.  PAST SURGICAL HISTORY: Ruptured appendix at age 38.  MEDICATIONS: None except for Excedrin Migraine every few months, and the last time was three months ago.  ALLERGIES:  1. COMPAZINE causes a rash.  2. IMITREX causes rash.  FAMILY HISTORY: Father is alive but his health status is unknown.  Mother is 13 and alive and well.  Brother is  alive but his health status is unknown.  He has sisters, 55 and 20, alive and well.  A son, two, is alive and well.  SOCIAL HISTORY: The patient is divorced.  He lives with his grandfather.  He works as an Journalist, newspaper.  Does not drink alcohol.  He has smoked a pack of cigarettes per day for about ten years.  REVIEW OF SYSTEMS: Negative except for the above.  PHYSICAL EXAMINATION:  GENERAL: The patient is an obese, well-developed 35 year old white male in moderate distress secondary to abdominal pain.  VITAL SIGNS: Blood pressure 112/60, pulse 74, respirations 14, temperature 96.9 degrees.  O2 saturation 96 on room air.  HEENT: PERRLA.  Conjunctivae normal.  No scleral icterus present.  Fundi unremarkable.  Ears normal.  Nose is a little congested.  Mouth and throat negative except for moderate inflammation of the posterior oropharynx consistent with cigarette smoking.  NECK: Supple without adenopathy.  Carotids 2+ without bruits.  Thyroid unremarkable.  LUNGS: Clear.  HEART: Regular rhythm without murmurs or gallops.  ABDOMEN: Soft.  He has moderate epigastric and right upper quadrant tenderness with some guarding but no rebound.  No organomegaly or masses present.  Bowel sounds normal.  EXTREMITIES: Unremarkable with no edema.  Peripheral pulses 2+.  SKIN: Negative.  GU: Genitalia negative.  No hernia present.  RECTAL: Negative with light brown heme negative stool.  LABORATORY DATA: CBC shows hemoglobin of 13.9, hematocrit 39.5,  platelets 201,000; WBC 8900 with normal Differential.  CMET unremarkable except SGOT is minimally elevated at 57, SGPT 95, potassium 3.4, albumin 3.3.  Amylase and lipase are okay.  IMPRESSION: Upper gastrointestinal bleed with vomiting, questionably secondary to gastritis or peptic ulcer disease.  He could have a bowel obstruction but that seems less likely.  PLAN: GI consult was obtained from Dr. Matthias Hughs in the emergency room.   The patient will probably be scheduled for upper endoscopy.  He will be given morphine sulfate for pain, Phenergan for nausea, and also IV Protonix.  He will be given IV fluids in small amounts and clear liquids as tolerated.Dictated by:   Carolyne Fiscal, M.D. Attending Physician:  Jetty Duhamel T DD:  10/14/01 TD:  10/14/01 Job: [id number]

## 2011-03-29 NOTE — Discharge Summary (Signed)
[place]. [facility]  Patient:    [name], [name] Visit Number: [id number] MRN: [id number]          Service Type: MED Location: MICU [id number] Attending Physician:  Boneta Lucks Dictated by:   Carolyne Fiscal, M.D. Admit Date:  10/24/2001 Discharge Date: 10/26/2001                             Discharge Summary  DISCHARGE DIAGNOSES: 1. Gastritis. 2. Irritable bowel syndrome.  HISTORY OF PRESENT ILLNESS:  This 35 year old white male unassigned patient had the onset eight days prior to admission of epigastric abdominal pain associated with melena.  He went to the hospital in [place], [place], where he was admitted for less than 24 hours.  He was given IV fluids and went home.  He attempted to tolerate solid foods, but had recurrent episodes about twice a day of hematemesis.  His bowel movements returned to normal without further evidence of blood or melena.  About 48 hours prior to this admission he developed increased amounts of hematemesis.  He presented to the [facility] emergency room where he was given IV fluids and Phenergan.  Then he was discharged on Tylox and Protonix.  Follow-up outpatient appointment was made for GI consultation in two weeks.  The patient had no known fever.  He had no history of alcohol use and did not take aspirin or NSAIDs.  There was no prior history of a similar episode.  He had had a cholecystectomy done at age 1.  PAST MEDICAL HISTORY:  Otherwise negative except for migraine headaches.  He had a history of a ruptured appendix at age 11.  ALLERGIES:  COMPAZINE, IMITREX.  MEDICATIONS:  None at the time of admission except for Excedrin Migraine every few months and the last time being three months prior to admission.  SOCIAL HISTORY:  The patient lived with his grandfather at the time of admission.  He was employed as an Journalist, newspaper.  PHYSICAL EXAMINATION:  GENERAL: The  patient is an obese, well-developed, 35 year old white male in moderate distress secondary to abdominal pain. VITAL SIGNS: Blood pressure 112/60, pulse 74, respirations 14, temperature 96.9.  O2 saturation 96% on room air.  HEENT: Negative except for moderate inflammation of the posterior oropharynx consistent with cigarette smoking. LUNGS: Clear.  HEART: Negative.  ABDOMEN: Soft.  He had moderate epigastric and right upper quadrant tenderness with some guarding, but no rebound. No organomegaly or masses were present.  Bowel sounds were normal.  Remainder of physical examination was negative including rectal examination which demonstrated light brown hemoccult negative stool.  ADMISSION DIAGNOSIS:  Upper gastrointestinal bleed with vomiting, questionably secondary to gastritis or peptic ulcer disease.  It was felt that he could have a bowel obstruction but that seemed less likely.  LABORATORY DATA:  Admission chest x-ray showed a small right pleural effusion. Otherwise no abnormalities were noted.  Admission chest x-ray showed moderate changes of asthma or bronchitis and scarring of the left lower lobe.  An insignificant pleural effusion was present.  A CT scan of the abdomen and pelvis showed thickening of the bowel wall in the sigmoid colon region. This was felt to be possibly secondary to diverticulitis.  There was no significant interval change when compared to the prior study of October 13, 2001.  A hepatobiliary scan showed no abnormality and no obstruction of the biliary ductal system.  Admission CBC  showed hemoglobin 13.9, hematocrit 39.5, platelets 201,000, and WBC 8900 with normal differential.  During his hospitalization, his CBCs remained normal.  Sed rate was mildly elevated at 26 on October 21, 2001.  CMET on admission was normal except for a potassium of 3.4 and a BUN of 5 with his AST and ALT minimally elevated.  His amylase and lipase were okay.  Subsequent CMETs showed  improvement of his potassium.  His liver enzymes returned to normal several days prior to discharge.  Hepatitis B surface antigen was negative, hepatitis B core antibody was negative, and hepatitis C antibody was negative.  Urinalysis on admission was unremarkable. Urine culture on admission was negative.  Stool for C.difficile toxin was negative.  Infectious monotest was negative and a Strep Group A screen was also negative.  Helicobacter pylori antibody was negative.  HOSPITAL COURSE:  The patient was admitted and given intravenous fluids.  He was given morphine IV for pain and Protonix IV.  He was seen in GI consultation by Florencia Reasons, M.D. who felt that it was unclear why the patient was having his current symptoms.  He recommended upper endoscopy and increasing the Protonix.  Upper endoscopy was done on the third hospital day and showed some mild esophageal erosions with hiatal hernia which Dr. Matthias Hughs did not feel were responsible for the patients symptoms.  On October 19, 2001, the patient had flexible sigmoidoscopy done by Dr. Matthias Hughs and it was normal.  He did not feel that the patient needed further evaluation of colon. In the absence of any obvious explanation for the patients symptoms, his morphine was reduced.  He continued to experience abdominal pain and have abdominal tenderness and guarding in the right upper quadrant and left lower quadrant.  Because of the possibility of diverticulitis, he was given Unasyn IV.  On the fourth hospital day, Dr. Matthias Hughs recommended trying to switch the patient to oral Protonix and analgesics and recommended adding Bentyl.  The patients diet was advanced, but he experienced some crampy pain after eating in the right lower quadrant, right upper quadrant, and right epigastric area with some nausea, vomiting, and diarrhea after meals.  Unasyn was discontinued and Cipro and metronidazole were initiated.  Evaluation of stools, output  and emesis by the nurses was requested, but was not obtained.  The patients estimates of his p.o. intake seemed below what was expected from evaluating  his clinical status and physical examination.  On October 20, 2001, the patient was still having abdominal pain even 15 minutes after clear liquids. Chart indicated that the patients weight had fluctuated from 286 at the time of admission down to 251 on December 7, up to 260 on December 10.  It was felt that the most likely explanation for this was a 25 pound error on the scale at the time of admission.  He was felt that he may have gastritis and irritable bowel syndrome and that he was improving.  The question was raised by Dr. Matthias Hughs as to whether some of his symptoms might be functional.  The patient was questioned about stress and denied any at work or at home.  On December 12, the patient was improved and his p.o. intake improved.  Despite an obvious source of infection, the Cipro and metronidazole were continued, because the patient had improved on them.  This was to cover the possibility of a resolving gastroenteritis.  The patient thereafter developed abdominal pain and Dr. Matthias Hughs recommended a trial of Carafate before meals.  He noted that the patients specific gravity was not concentrated and that the urine was negative for ketones despite the patients alligations of no food consumption for 10 days.  Also, the patients albumin was stable and the patient frequently left the floor.  The patient denied any food or liquid consumption outside of his hospital room.  On December 13, the patient reported that he found out several days before that he had lost his job and also that his grandfather was not going to allow him to live with him any longer.  The patients parents also were refusing to take him.  The patient said he had no friends that would give him a place to stay.  He did admit that he had not asked for any temporary  arrangements after discharge.  Examination of his mental status appeared normal on the day of discharge.  His abdominal symptoms were improved and he was tolerating some solid food.  He was discharged in satisfactory and improved condition.  DISCHARGE MEDICATIONS: 1. Protonix 40 mg b.i.d. for two weeks. 2. Bentyl 20 mg b.i.d. p.r.n. for crampy abdominal pain. 3. Phenergan 25 mg q.4h. p.r.n. for nausea and vomiting. 4. Vicodin one q.4h. p.r.n. for pain.  It was felt that the patients personal social situation was effecting his desire to leave the hospital.  He appeared to be medically and psychiatrically stable.  Care management was consulted regarding arrangements to be made after discharge.  They made arrangements for the patient to go to the [facility] on [address] with support by a [employer] for two nights.  The patients prescriptions were taken to the hospital pharmacy and he was given a three-day supply and encouraged to follow up at [facility].  The patient was also given phone numbers of local shelters for future reference if he should need it.  DIET:  Liquids only for a few days and then solids as tolerated.  DISCHARGE INSTRUCTIONS:  No aspirin, anti-inflammatories, or alcohol.  FOLLOW-UP:  One week after discharge. Dictated by:   Carolyne Fiscal, M.D. Attending Physician:  Boneta Lucks DD:  11/30/01 TD:  12/01/01 Job: [id number]

## 2011-03-29 NOTE — Procedures (Signed)
[place]. [facility]  Patient:    [name], [name] Visit Number: [id number] MRN: [id number]          Service Type: MED Location: [phone] Attending Physician:  Lonia Blood Dictated by:   Everardo All Madilyn Fireman, M.D. Proc. Date: 10/13/01 Admit Date:  10/13/2001   CC:         Jetty Duhamel, M.D.  Florencia Reasons, M.D.   Procedure Report  PROCEDURE:  Esophagogastroduodenoscopy.  INDICATION FOR PROCEDURE:  Patient admitted yesterday with epigastric abdominal pain.  CT scan, routine serum chemistries, amylase and lipase all normal.  DESCRIPTION OF PROCEDURE:  The patient was placed in the left lateral decubitus position and placed on the pulse monitor with continuous low-flow oxygen delivered by nasal cannula.  He was sedated with 100 mg IV Demerol and 10 mg IV Versed.  The Olympus video endoscope was advanced under direct vision into the oropharynx and esophagus.  The esophagus was straight and of normal caliber with the squamocolumnar line at 32 cm above a 2 cm sliding hiatal hernia.  There were one or two discrete erosions extending about 2 cm up from the Z-line with no visible exudate consistent with a mild to moderate esophagitis.  There was no ring, stricture, or other abnormality of the GE junction.  The stomach was entered and a small amount of liquid secretions was suctioned from the fundus.  Retroflex view of the cardia was unremarkable. The fundus, body, antrum, and pylorus all appeared normal.  The duodenum was entered and both the bulb and second portion were well-inspected and appeared to be within normal limits.  The scope was then withdrawn and the patient returned to the recovery room in stable condition.  He tolerated the procedure well, and there were no immediate complications.  IMPRESSION: 1. Mild esophagitis. 2. Sliding hiatal hernia.  PLAN:  If he does not respond to Protonix, will obtain gallbladder  ultrasound, given that his liver function tests were slightly elevated yesterday. Dictated by:   Everardo All Madilyn Fireman, M.D. Attending Physician:  Jetty Duhamel T DD:  10/14/01 TD:  10/14/01 Job: [id number]

## 2011-04-03 ENCOUNTER — Ambulatory Visit: Payer: Medicare Other | Attending: Family Medicine | Admitting: Physical Therapy

## 2011-04-03 DIAGNOSIS — M545 Low back pain, unspecified: Secondary | ICD-10-CM | POA: Insufficient documentation

## 2011-04-03 DIAGNOSIS — R5381 Other malaise: Secondary | ICD-10-CM | POA: Insufficient documentation

## 2011-04-03 DIAGNOSIS — M25669 Stiffness of unspecified knee, not elsewhere classified: Secondary | ICD-10-CM | POA: Insufficient documentation

## 2011-04-03 DIAGNOSIS — IMO0001 Reserved for inherently not codable concepts without codable children: Secondary | ICD-10-CM | POA: Insufficient documentation

## 2011-04-11 ENCOUNTER — Emergency Department (HOSPITAL_COMMUNITY)
Admission: EM | Admit: 2011-04-11 | Discharge: 2011-04-12 | Disposition: A | Discharge status: Other Acute Inpatient Hospital | Payer: Medicare Other | Source: Home / Self Care | Attending: Emergency Medicine | Admitting: Emergency Medicine

## 2011-04-11 LAB — DIFFERENTIAL
Basophils Absolute: 0 10*3/uL (ref 0.0–0.1)
Eosinophils Relative: 4 % (ref 0–5)
Lymphocytes Relative: 29 % (ref 12–46)
Lymphs Abs: 2.3 10*3/uL (ref 0.7–4.0)
Monocytes Absolute: 0.5 10*3/uL (ref 0.1–1.0)
Monocytes Relative: 6 % (ref 3–12)

## 2011-04-11 LAB — COMPREHENSIVE METABOLIC PANEL
ALT: 21 U/L (ref 0–53)
Albumin: 4.1 g/dL (ref 3.5–5.2)
Alkaline Phosphatase: 109 U/L (ref 39–117)
Calcium: 9.1 mg/dL (ref 8.4–10.5)
Potassium: 3.9 mEq/L (ref 3.5–5.1)
Sodium: 137 mEq/L (ref 135–145)
Total Protein: 7.5 g/dL (ref 6.0–8.3)

## 2011-04-11 LAB — RAPID URINE DRUG SCREEN, HOSP PERFORMED
Amphetamines: NOT DETECTED
Benzodiazepines: NOT DETECTED
Tetrahydrocannabinol: NOT DETECTED

## 2011-04-11 LAB — URINALYSIS, ROUTINE W REFLEX MICROSCOPIC
Bilirubin Urine: NEGATIVE
Hgb urine dipstick: NEGATIVE
Ketones, ur: NEGATIVE mg/dL
Protein, ur: NEGATIVE mg/dL
Urobilinogen, UA: 0.2 mg/dL (ref 0.0–1.0)

## 2011-04-11 LAB — CBC
HCT: 42.7 % (ref 39.0–52.0)
MCH: 31 pg (ref 26.0–34.0)
MCHC: 34.9 g/dL (ref 30.0–36.0)
MCV: 89 fL (ref 78.0–100.0)
RDW: 13 % (ref 11.5–15.5)

## 2011-04-12 ENCOUNTER — Inpatient Hospital Stay (HOSPITAL_COMMUNITY)
Admission: RE | Admit: 2011-04-12 | Discharge: 2011-04-15 | DRG: 885 | Disposition: A | Discharge status: Home / Self Care | Payer: Medicare Other | Source: Ambulatory Visit | Attending: Psychiatry | Admitting: Psychiatry

## 2011-04-12 DIAGNOSIS — E785 Hyperlipidemia, unspecified: Secondary | ICD-10-CM | POA: Diagnosis present

## 2011-04-12 DIAGNOSIS — M549 Dorsalgia, unspecified: Secondary | ICD-10-CM | POA: Diagnosis present

## 2011-04-12 DIAGNOSIS — F205 Residual schizophrenia: Principal | ICD-10-CM | POA: Diagnosis present

## 2011-04-12 DIAGNOSIS — F200 Paranoid schizophrenia: Secondary | ICD-10-CM

## 2011-04-13 DIAGNOSIS — F29 Unspecified psychosis not due to a substance or known physiological condition: Secondary | ICD-10-CM

## 2011-04-16 ENCOUNTER — Ambulatory Visit: Payer: Medicare Other | Attending: Family Medicine | Admitting: Physical Therapy

## 2011-04-16 DIAGNOSIS — R5381 Other malaise: Secondary | ICD-10-CM | POA: Insufficient documentation

## 2011-04-16 DIAGNOSIS — IMO0001 Reserved for inherently not codable concepts without codable children: Secondary | ICD-10-CM | POA: Insufficient documentation

## 2011-04-16 DIAGNOSIS — M545 Low back pain, unspecified: Secondary | ICD-10-CM | POA: Insufficient documentation

## 2011-04-16 DIAGNOSIS — M25669 Stiffness of unspecified knee, not elsewhere classified: Secondary | ICD-10-CM | POA: Insufficient documentation

## 2011-04-18 ENCOUNTER — Encounter: Payer: Medicare Other | Admitting: Physical Therapy

## 2011-04-20 NOTE — H&P (Signed)
NAME:  [name], [name]           ACCOUNT NO.:  [id number]  MEDICAL RECORD NO.:  [id number]           PATIENT TYPE:  LOCATION:  0406                          FACILITY:  [facility]  PHYSICIAN:  Eulogio Ditch, MD DATE OF BIRTH:  1976-08-24  DATE OF ADMISSION:  04/12/2011 DATE OF DISCHARGE:                      PSYCHIATRIC ADMISSION ASSESSMENT   DATE OF ASSESSMENT:  April 13, 2011 at 11:30 a.m.  IDENTIFICATION:  This is a 35 year old single Caucasian male.  This is a voluntary admission.  HISTORY OF PRESENT ILLNESS:  [name] presents complaining of an exacerbation of his schizophrenia with an increase in the auditory hallucinations that he chronically has, they have been more intrusive and insistent and with commands to harm himself.  He was afraid that he would cut himself, although he really did not want to harm himself. Asking for help, getting the voices to stop and reports that he had been taking his medication as prescribed.  He denies any homicidal thoughts. He was brought to the hospital by his PSIS ACT Team.  PAST PSYCHIATRIC HISTORY:  [name] was most recently on our unit February 2011 and was given an initial diagnosis of schizoaffective disorder at that time.  He had also suffered a subdural hematoma and was being followed by the [facility].  He was continued on his routine regimen of Haldol, Decanoate and Cymbalta at that time which he has continued.  He denies any substance abuse.  He reports his back pain has gotten worse and is an exacerbating factor for the auditory hallucinations.  SOCIAL HISTORY:  This is a single Caucasian male who was previously living at [facility] and requests getting help and possibly returning there.  No legal problems.  FAMILY HISTORY:  Negative for mental illness or substance abuse.  MEDICAL HISTORY: 1. No regular primary care provider is known, currently followed by an     orthopedist at [facility]  for his back pain.  Medical     problems are acute back pain times 2 weeks.  MRI pending. 2. Dyslipidemia. 3. GERD.  CURRENT MEDICATIONS: 1. Seroquel 100 mg 1 tablet daily at bedtime. 2. Norco 10/325 one tablet twice daily as needed for pain. 3. Haldol Decanoate 150 mg one injection monthly, last taken Mar 18, 2011, not confirmed.  Haldol 10 mg 1 tablet twice daily. 4. Cogentin 0.5 mg 1 tablet b.i.d. 5. Clonazepam 0.5 mg 1 tablet in the morning, 2 tablets at bedtime. 6. Zyrtec 1 tablet daily. 7. Trilipix 135 mg daily. 8. MVI one daily. 9. Cymbalta 30 mg daily along with a 60 mg cap daily to be equal 90 mg     daily. 10.Omeprazole 40 mg daily. 11.Ambien 10 mg nightly. 12.Trazodone 100 mg p.o. nightly.  DRUG ALLERGIES: 1. NSAIDS. 2. COMPAZINE. 3. IMITREX. 4. RISPERDAL. 5. ZYPREXA which causes nausea.  MENTAL STATUS EXAM:  Fully alert male, pleasant and cooperative.  He did receive additional Seroquel yesterday and says his auditory hallucinations are significantly diminished.  Does hear some auditory hallucinations in the background chronically, but they are not intrusive today and no commands.  Eye contact good, cooperative, complaining of back pain, 7-8/10 and  he looks uncomfortable, shifting frequently in his seat.  Insight adequate.  Judgment fair.  Impulse control satisfactory. Remote and working memory are intact.  Fund of knowledge is fairly good. Intelligence is average.  Axis I:  Psychosis NOS, rule out schizophrenia NOS. Axis II:  Deferred. Axis III:  Acute back pain times 2 weeks, dyslipidemia. Axis IV:  Deferred. Axis V:  Current 40, past year not known.  PLAN:  Is to voluntarily admit him with a goal of alleviating his suicidal thoughts.  We have increased his at bedtime Seroquel to 200 mg at bedtime, and I will resume his hydrocodone and he was due for an MRI on Thursday, Apr 11, 2011 which he apparently did not receive, we will see about rescheduling  that and will keep him on his hydrocodone for his back pain and his other routine medications.     Margaret A. Lorin Picket, N.P.   ______________________________ Eulogio Ditch, MD    MAS/MEDQ  D:  04/13/2011  T:  04/13/2011  Job:  [id number]  Electronically Signed by Kari Baars N.P. on 04/15/2011 08:14:46 AM Electronically Signed by Eulogio Ditch  on 04/20/2011 05:58:09 PM

## 2011-04-21 ENCOUNTER — Emergency Department (HOSPITAL_COMMUNITY)
Admission: EM | Admit: 2011-04-21 | Discharge: 2011-04-21 | Disposition: A | Discharge status: Home / Self Care | Payer: Medicare Other | Attending: Emergency Medicine | Admitting: Emergency Medicine

## 2011-04-21 DIAGNOSIS — F3289 Other specified depressive episodes: Secondary | ICD-10-CM | POA: Insufficient documentation

## 2011-04-21 DIAGNOSIS — M5137 Other intervertebral disc degeneration, lumbosacral region: Secondary | ICD-10-CM | POA: Insufficient documentation

## 2011-04-21 DIAGNOSIS — M51379 Other intervertebral disc degeneration, lumbosacral region without mention of lumbar back pain or lower extremity pain: Secondary | ICD-10-CM | POA: Insufficient documentation

## 2011-04-21 DIAGNOSIS — M47817 Spondylosis without myelopathy or radiculopathy, lumbosacral region: Secondary | ICD-10-CM | POA: Insufficient documentation

## 2011-04-21 DIAGNOSIS — M545 Low back pain, unspecified: Secondary | ICD-10-CM | POA: Insufficient documentation

## 2011-04-21 DIAGNOSIS — F329 Major depressive disorder, single episode, unspecified: Secondary | ICD-10-CM | POA: Insufficient documentation

## 2011-04-21 DIAGNOSIS — Z79899 Other long term (current) drug therapy: Secondary | ICD-10-CM | POA: Insufficient documentation

## 2011-04-23 ENCOUNTER — Encounter: Payer: Medicare Other | Admitting: Physical Therapy

## 2011-04-25 ENCOUNTER — Ambulatory Visit: Payer: Medicare Other | Admitting: Physical Therapy

## 2011-05-04 ENCOUNTER — Emergency Department (HOSPITAL_COMMUNITY): Payer: Medicare Other

## 2011-05-04 ENCOUNTER — Emergency Department (HOSPITAL_COMMUNITY)
Admission: EM | Admit: 2011-05-04 | Discharge: 2011-05-04 | Disposition: A | Discharge status: Home / Self Care | Payer: Medicare Other | Attending: Emergency Medicine | Admitting: Emergency Medicine

## 2011-05-04 DIAGNOSIS — R1012 Left upper quadrant pain: Secondary | ICD-10-CM | POA: Insufficient documentation

## 2011-05-04 DIAGNOSIS — R61 Generalized hyperhidrosis: Secondary | ICD-10-CM | POA: Insufficient documentation

## 2011-05-04 DIAGNOSIS — F329 Major depressive disorder, single episode, unspecified: Secondary | ICD-10-CM | POA: Insufficient documentation

## 2011-05-04 DIAGNOSIS — R29898 Other symptoms and signs involving the musculoskeletal system: Secondary | ICD-10-CM | POA: Insufficient documentation

## 2011-05-04 DIAGNOSIS — Z79899 Other long term (current) drug therapy: Secondary | ICD-10-CM | POA: Insufficient documentation

## 2011-05-04 DIAGNOSIS — R112 Nausea with vomiting, unspecified: Secondary | ICD-10-CM | POA: Insufficient documentation

## 2011-05-04 DIAGNOSIS — F3289 Other specified depressive episodes: Secondary | ICD-10-CM | POA: Insufficient documentation

## 2011-05-04 DIAGNOSIS — R209 Unspecified disturbances of skin sensation: Secondary | ICD-10-CM | POA: Insufficient documentation

## 2011-05-04 DIAGNOSIS — M545 Low back pain, unspecified: Secondary | ICD-10-CM | POA: Insufficient documentation

## 2011-05-04 LAB — COMPREHENSIVE METABOLIC PANEL
AST: 55 U/L — ABNORMAL HIGH (ref 0–37)
BUN: 12 mg/dL (ref 6–23)
CO2: 26 mEq/L (ref 19–32)
Calcium: 8.9 mg/dL (ref 8.4–10.5)
Chloride: 103 mEq/L (ref 96–112)
Creatinine, Ser: 0.61 mg/dL (ref 0.50–1.35)
GFR calc Af Amer: >60 mL/min (ref >60)
GFR calc non Af Amer: >60 mL/min (ref >60)
Glucose, Bld: 119 mg/dL — ABNORMAL HIGH (ref 70–99)
Total Bilirubin: 0.3 mg/dL (ref 0.3–1.2)

## 2011-05-04 LAB — URINALYSIS, ROUTINE W REFLEX MICROSCOPIC
Bilirubin Urine: NEGATIVE
Glucose, UA: NEGATIVE mg/dL
Hgb urine dipstick: NEGATIVE
Ketones, ur: NEGATIVE mg/dL
Protein, ur: NEGATIVE mg/dL
Urobilinogen, UA: 2 mg/dL — ABNORMAL HIGH (ref 0.0–1.0)

## 2011-05-04 LAB — CBC
HCT: 39.7 % (ref 39.0–52.0)
Hemoglobin: 14.1 g/dL (ref 13.0–17.0)
MCV: 87.8 fL (ref 78.0–100.0)
RDW: 13.2 % (ref 11.5–15.5)
WBC: 8.8 10*3/uL (ref 4.0–10.5)

## 2011-05-04 LAB — PROTIME-INR
INR: 0.96 (ref 0.00–1.49)
Prothrombin Time: 13 seconds (ref 11.6–15.2)

## 2011-05-04 LAB — DIFFERENTIAL
Basophils Absolute: 0 10*3/uL (ref 0.0–0.1)
Eosinophils Relative: 3 % (ref 0–5)
Lymphocytes Relative: 23 % (ref 12–46)
Monocytes Absolute: 0.9 10*3/uL (ref 0.1–1.0)
Monocytes Relative: 10 % (ref 3–12)
Neutro Abs: 5.6 10*3/uL (ref 1.7–7.7)

## 2011-05-04 LAB — LIPASE, BLOOD: Lipase: 17 U/L (ref 11–59)

## 2011-05-04 LAB — SEDIMENTATION RATE: Sed Rate: 50 mm/hr — ABNORMAL HIGH (ref 0–16)

## 2011-05-07 NOTE — Discharge Summary (Signed)
NAMEMarland Keith  [place], BENTSON           ACCOUNT NO.:  [id number]  MEDICAL RECORD NO.:  [id number]  LOCATION:  0406                          FACILITY:  [facility]  PHYSICIAN:  Eulogio Ditch, MD DATE OF BIRTH:  01-Nov-1976  DATE OF ADMISSION:  04/12/2011 DATE OF DISCHARGE:  04/15/2011                              DISCHARGE SUMMARY   IDENTIFYING INFORMATION:  This is a 35 year old Caucasian male.  This is a voluntary admission.  HISTORY OF PRESENT ILLNESS:  [name] presented complaining of an exacerbation of his schizophrenia with an increase in auditory hallucinations, which he typically has chronically.  Within the previous week they had been more intrusive and insistent with commands to harm himself.  He feared that he would cut himself although he really did not want to harm himself.  He was asking for help getting the voices to stop.  Reported that he had been taking his medication as prescribed. He is followed as an outpatient by the [facility] and had a history of a recent admission on our unit in February 2011.  He has a history of suffering a subdural hematoma and has been followed in the past by the [facility].  MEDICAL EVALUATION AND DIAGNOSTIC STUDIES:  He was medically evaluated in the emergency room.  This is a large-built Caucasian male who is fully alert and no abnormal movements.  Complains of chronic back pain. Urine drug screen negative for all substances.  Alcohol level less than 11.  Chemistry normal.  BUN 6, creatinine 0.81.  Urinalysis normal.  CBC normal with a hemoglobin of 14.9.  His regular medical physicians include an orthopedist at [facility] for his chronic back pain, and he has an MRI pending.  COURSE OF HOSPITALIZATION:  He was admitted to our acute stabilization unit, and we elected to increase his Seroquel to 200 mg p.o. q.h.s. and continue his routine medications of Haldol 10 mg p.o. b.i.d. and his Haldol Decanoate  150 mg IM monthly, which he had last received Mar 18, 2011.  Also continued his Klonopin 0.5 mg 1 tablet in the morning, 2 tablets at bedtime, and his hydrocodone 10/325 mg.  We elected to give him 1 tablet 3 times daily p.r.n. for pain.  Cymbalta was continued at 90 mg daily, and his other routine medications were continued.  By the 3rd he had slept well.  No intrusive hallucinations though he admitted that he continued to hear low voices in the background which were typical for him.  He was denying suicidal ideas, intent or plan, and he reported his back pain was under control.  His participation in group therapy and unit activities was appropriate. He was currently living in a boarding house where there was a lot of drug usage and he requested placement in assisted living facility, but we elected to defer that to his outpatient provider.  By the 4th, he was in full contact with reality, calm, cooperative, denying any dangerous thoughts, and was ready for discharge.  DISCHARGE/PLAN:  Follow up with [facility] on April 16, 2011, at 11:00 a.m.  DISCHARGE DIAGNOSIS:  AXIS I:  Psychosis NOS, rule out schizophrenia NOS. AXIS II:  Deferred. AXIS III:  Acute  back pain.  Dyslipidemia. AXIS IV:  Deferred. AXIS V:  Current 58, past year not known.  DISCHARGE CONDITION:  Stable and improved.  DISCHARGE MEDICATIONS: 1. Gabapentin 600 mg 3 times daily. 2. Meloxicam 15 mg daily. 3. Metoprolol 50 mg b.i.d. 4. Quetiapine 300 mg q.h.s. 5. Ambien 10 mg daily at bedtime. 6. Cogentin 0.5 mg b.i.d. 7. Cymbalta 90 mg every morning. 8. Haldol Decanoate 150 mg monthly, next due June 7. 9. Haldol 10 mg b.i.d. 10.Klonopin 0.5 mg 1 tablet in the morning, 2 tablets at bedtime. 11.Multivitamin 1 tablet daily. 12.Niaspan 1 tablet daily at bedtime. 13.Norco 10/325, resume previous dosing as instructed 1 tablet twice     daily. 14.Omeprazole 40 mg daily. 15.Trazodone 100 mg daily. 16.Trilipix 135 mg  daily. 17.Zyrtec 10 mg daily.  He was given a prescription for quetiapine 300 mg tab #30.     Margaret A. Lorin Picket, N.P.   ______________________________ Eulogio Ditch, MD    MAS/MEDQ  D:  04/20/2011  T:  04/20/2011  Job:  [id number]  Electronically Signed by Kari Baars N.P. on 05/06/2011 10:25:21 AM Electronically Signed by Eulogio Ditch  on 05/07/2011 12:05:08 PM

## 2011-06-19 ENCOUNTER — Ambulatory Visit: Payer: Medicare Other | Attending: Family Medicine | Admitting: Physical Therapy

## 2011-06-19 DIAGNOSIS — M545 Low back pain, unspecified: Secondary | ICD-10-CM | POA: Insufficient documentation

## 2011-06-19 DIAGNOSIS — R5381 Other malaise: Secondary | ICD-10-CM | POA: Insufficient documentation

## 2011-06-19 DIAGNOSIS — IMO0001 Reserved for inherently not codable concepts without codable children: Secondary | ICD-10-CM | POA: Insufficient documentation

## 2011-06-19 DIAGNOSIS — M25669 Stiffness of unspecified knee, not elsewhere classified: Secondary | ICD-10-CM | POA: Insufficient documentation

## 2011-07-22 ENCOUNTER — Emergency Department (HOSPITAL_COMMUNITY)
Admission: EM | Admit: 2011-07-22 | Discharge: 2011-07-24 | Disposition: A | Discharge status: Home / Self Care | Payer: Medicare Other | Attending: Emergency Medicine | Admitting: Emergency Medicine

## 2011-07-22 DIAGNOSIS — Z79899 Other long term (current) drug therapy: Secondary | ICD-10-CM | POA: Insufficient documentation

## 2011-07-22 DIAGNOSIS — R443 Hallucinations, unspecified: Secondary | ICD-10-CM | POA: Insufficient documentation

## 2011-07-22 DIAGNOSIS — R45851 Suicidal ideations: Secondary | ICD-10-CM | POA: Insufficient documentation

## 2011-07-22 DIAGNOSIS — R4182 Altered mental status, unspecified: Secondary | ICD-10-CM | POA: Insufficient documentation

## 2011-07-22 LAB — DIFFERENTIAL
Basophils Relative: 0 % (ref 0–1)
Eosinophils Absolute: 0.2 10*3/uL (ref 0.0–0.7)
Eosinophils Relative: 2 % (ref 0–5)
Monocytes Absolute: 0.6 10*3/uL (ref 0.1–1.0)
Monocytes Relative: 7 % (ref 3–12)

## 2011-07-22 LAB — CBC
Hemoglobin: 15.2 g/dL (ref 13.0–17.0)
MCH: 30.6 pg (ref 26.0–34.0)
MCHC: 34.5 g/dL (ref 30.0–36.0)
RDW: 13.3 % (ref 11.5–15.5)

## 2011-07-22 LAB — COMPREHENSIVE METABOLIC PANEL
ALT: 10 U/L (ref 0–53)
AST: 12 U/L (ref 0–37)
Alkaline Phosphatase: 107 U/L (ref 39–117)
CO2: 23 mEq/L (ref 19–32)
Chloride: 102 mEq/L (ref 96–112)
GFR calc non Af Amer: >60 mL/min (ref >60)
Sodium: 137 mEq/L (ref 135–145)
Total Bilirubin: 0.3 mg/dL (ref 0.3–1.2)

## 2011-07-22 LAB — RAPID URINE DRUG SCREEN, HOSP PERFORMED: Barbiturates: NOT DETECTED

## 2011-07-31 ENCOUNTER — Emergency Department (HOSPITAL_COMMUNITY): Payer: Medicare Other

## 2011-07-31 ENCOUNTER — Emergency Department (HOSPITAL_COMMUNITY)
Admission: EM | Admit: 2011-07-31 | Discharge: 2011-07-31 | Disposition: A | Discharge status: Home / Self Care | Payer: Medicare Other | Attending: Emergency Medicine | Admitting: Emergency Medicine

## 2011-07-31 DIAGNOSIS — Y92009 Unspecified place in unspecified non-institutional (private) residence as the place of occurrence of the external cause: Secondary | ICD-10-CM | POA: Insufficient documentation

## 2011-07-31 DIAGNOSIS — X500XXA Overexertion from strenuous movement or load, initial encounter: Secondary | ICD-10-CM | POA: Insufficient documentation

## 2011-07-31 DIAGNOSIS — E669 Obesity, unspecified: Secondary | ICD-10-CM | POA: Insufficient documentation

## 2011-07-31 DIAGNOSIS — S93409A Sprain of unspecified ligament of unspecified ankle, initial encounter: Secondary | ICD-10-CM | POA: Insufficient documentation

## 2011-07-31 DIAGNOSIS — R609 Edema, unspecified: Secondary | ICD-10-CM | POA: Insufficient documentation

## 2011-08-07 LAB — HEPATIC FUNCTION PANEL
ALT: 20
Bilirubin, Direct: 0.1
Indirect Bilirubin: 0.5

## 2011-08-07 LAB — AMYLASE: Amylase: 40

## 2011-10-18 ENCOUNTER — Emergency Department (HOSPITAL_COMMUNITY)
Admission: EM | Admit: 2011-10-18 | Discharge: 2011-10-18 | Disposition: A | Discharge status: Home / Self Care | Payer: Medicare Other | Attending: Emergency Medicine | Admitting: Emergency Medicine

## 2011-10-18 ENCOUNTER — Emergency Department (HOSPITAL_COMMUNITY): Payer: Medicare Other

## 2011-10-18 DIAGNOSIS — F319 Bipolar disorder, unspecified: Secondary | ICD-10-CM | POA: Insufficient documentation

## 2011-10-18 DIAGNOSIS — S93409A Sprain of unspecified ligament of unspecified ankle, initial encounter: Secondary | ICD-10-CM | POA: Insufficient documentation

## 2011-10-18 DIAGNOSIS — F200 Paranoid schizophrenia: Secondary | ICD-10-CM | POA: Insufficient documentation

## 2011-10-18 DIAGNOSIS — F17200 Nicotine dependence, unspecified, uncomplicated: Secondary | ICD-10-CM | POA: Insufficient documentation

## 2011-10-18 DIAGNOSIS — Y92009 Unspecified place in unspecified non-institutional (private) residence as the place of occurrence of the external cause: Secondary | ICD-10-CM | POA: Insufficient documentation

## 2011-10-18 DIAGNOSIS — X500XXA Overexertion from strenuous movement or load, initial encounter: Secondary | ICD-10-CM | POA: Insufficient documentation

## 2011-10-18 DIAGNOSIS — F411 Generalized anxiety disorder: Secondary | ICD-10-CM | POA: Insufficient documentation

## 2011-10-18 HISTORY — DX: Bipolar disorder, unspecified: F31.9

## 2011-10-18 HISTORY — DX: Anxiety disorder, unspecified: F41.9

## 2011-10-18 MED ORDER — IBUPROFEN 600 MG PO TABS: 600.0000 mg | ORAL_TABLET | Freq: Four times a day (QID) | ORAL | Status: AC | PRN

## 2011-10-18 NOTE — ED Provider Notes (Signed)
Medical screening examination/treatment/procedure(s) were performed by non-physician practitioner and as supervising physician I was immediately available for consultation/collaboration.  Cyris Maalouf, MD 10/18/11 1524 

## 2011-10-18 NOTE — ED Notes (Signed)
Pt presents with right ankle pain after "twisting" ankle while trying to put shoes on this AM. Pt ambulated with steady gate.

## 2011-10-18 NOTE — ED Provider Notes (Signed)
History     CSN: [id number] Arrival date & time: 10/18/2011  2:18 PM   First MD Initiated Contact with Patient 10/18/11 1428      Chief Complaint  Patient presents with  . Ankle Pain    (Consider location/radiation/quality/duration/timing/severity/associated sxs/prior treatment) Patient is a 35 y.o. male presenting with ankle pain. The history is provided by the patient.  Ankle Pain  The incident occurred 3 to 5 hours ago. The incident occurred at home. The injury mechanism was torsion. The pain is present in the right ankle. The pain is at a severity of 6/10. The pain is moderate. The pain has been constant since onset. Pertinent negatives include no numbness, no inability to bear weight, no loss of motion, no muscle weakness, no loss of sensation and no tingling. The symptoms are aggravated by bearing weight. He has tried nothing for the symptoms.    Past Medical History  Diagnosis Date  . Paranoid schizophrenia   . Bipolar 1 disorder   . Anxiety     Past Surgical History  Procedure Date  . Cholecystectomy   . Ankle surgey     No family history on file.  History  Substance Use Topics  . Smoking status: Current Everyday Smoker -- 1.5 packs/day    Types: Cigarettes  . Smokeless tobacco: Not on file  . Alcohol Use: No      Review of Systems  Constitutional: Negative for fever.  HENT: Negative for congestion, sore throat and neck pain.   Eyes: Negative.   Respiratory: Negative for chest tightness and shortness of breath.   Cardiovascular: Negative for chest pain.  Gastrointestinal: Negative for nausea and abdominal pain.  Genitourinary: Negative.   Musculoskeletal: Positive for arthralgias. Negative for joint swelling.  Skin: Negative.  Negative for rash and wound.  Neurological: Negative for dizziness, tingling, weakness, light-headedness, numbness and headaches.  Hematological: Negative.   Psychiatric/Behavioral: Negative.     Allergies  Imitrex;  Prochlorperazine edisylate; and Risperidone  Home Medications   Current Outpatient Rx  Name Route Sig Dispense Refill  . BENZTROPINE MESYLATE 0.5 MG PO TABS Oral Take 0.5 mg by mouth 2 (two) times daily.      Marland Kitchen BUPRENORPHINE 10 MCG/HR TD PTWK Transdermal Place 1 patch onto the skin once a week. Remove and discard used patches     . DULOXETINE HCL 30 MG PO CPEP Oral Take 30 mg by mouth every morning.      . DULOXETINE HCL 60 MG PO CPEP Oral Take 60 mg by mouth every morning.      Marland Kitchen GABAPENTIN 300 MG PO CAPS Oral Take 300 mg by mouth 3 (three) times daily.      Marland Kitchen HALOPERIDOL 10 MG PO TABS Oral Take 10 mg by mouth 2 (two) times daily.      Marland Kitchen HALOPERIDOL DECANOATE 100 MG/ML IM SOLN Intramuscular Inject 100 mg into the muscle every 28 (twenty-eight) days.      . MELOXICAM 15 MG PO TABS Oral Take 15 mg by mouth daily.      Marland Kitchen METOPROLOL TARTRATE 50 MG PO TABS Oral Take 50 mg by mouth 2 (two) times daily.      Carma Leaven M PLUS PO TABS Oral Take 1 tablet by mouth daily.      Marland Kitchen NIACIN (ANTIHYPERLIPIDEMIC) 500 MG PO TBCR Oral Take 500 mg by mouth at bedtime.      . OMEPRAZOLE 40 MG PO CPDR Oral Take 40 mg by mouth daily.      Marland Kitchen  SYSTANE OP Ophthalmic Apply 1 drop to eye 4 (four) times daily.      . QUETIAPINE FUMARATE 300 MG PO TABS Oral Take 300 mg by mouth 2 (two) times daily.      . TRAZODONE HCL 150 MG PO TABS Oral Take 300 mg by mouth at bedtime.      . IBUPROFEN 600 MG PO TABS Oral Take 1 tablet (600 mg total) by mouth every 6 (six) hours as needed for pain. 30 tablet 0    BP 123/48  Pulse 66  Temp(Src) 97.2 F (36.2 C) (Oral)  Resp 20  Ht 6\' 2"  (1.88 m)  Wt 290 lb (131.543 kg)  BMI 37.23 kg/m2  SpO2 100%  Physical Exam  Nursing note and vitals reviewed. Constitutional: He is oriented to person, place, and time. He appears well-developed and well-nourished.  HENT:  Head: Normocephalic.  Eyes: Conjunctivae are normal.  Neck: Normal range of motion.  Cardiovascular: Normal rate and  intact distal pulses.  Exam reveals no decreased pulses.   Pulses:      Dorsalis pedis pulses are 2+ on the right side, and 2+ on the left side.       Posterior tibial pulses are 2+ on the right side, and 2+ on the left side.  Pulmonary/Chest: Effort normal.  Musculoskeletal: He exhibits edema and tenderness.       Right ankle: He exhibits decreased range of motion. He exhibits no swelling, no ecchymosis and normal pulse. tenderness. Lateral malleolus and CF ligament tenderness found. No head of 5th metatarsal and no proximal fibula tenderness found. Achilles tendon normal.  Neurological: He is alert and oriented to person, place, and time. No sensory deficit.  Skin: Skin is warm, dry and intact.    ED Course  Procedures (including critical care time)  Labs Reviewed - No data to display Dg Ankle Complete Right  10/18/2011  *RADIOLOGY REPORT*  Clinical Data:  RIGHT ANKLE - COMPLETE 3+ VIEW  Comparison: None.  Findings:  IMPRESSION:  Original Report Authenticated By: Cyndie Chime, M.D.     1. Ankle sprain       MDM    Ankle sprain.  Pt has crutches and aso at home.   Spoke with Dr Tyron Russell who confirmed xray has no acute findings.        Candis Musa, PA 10/18/11 [phone]

## 2011-10-18 NOTE — ED Notes (Signed)
Pt a/ox4. resp even and unlabored. NAD at this time. D/C instructions and rx x1 reviewed with pt. Pt verbalized understanding. Pt ambulated to lobby with steady gate.

## 2011-10-18 NOTE — ED Notes (Signed)
Pt presents with right ankle pain after twisting it while trying to put his shoe on this AM. No obvious deformity noted. No swelling noted.

## 2011-10-21 ENCOUNTER — Other Ambulatory Visit (HOSPITAL_COMMUNITY): Payer: Self-pay | Admitting: Pulmonary Disease

## 2011-10-21 DIAGNOSIS — M25571 Pain in right ankle and joints of right foot: Secondary | ICD-10-CM

## 2011-10-23 ENCOUNTER — Ambulatory Visit (HOSPITAL_COMMUNITY)
Admission: RE | Admit: 2011-10-23 | Discharge: 2011-10-23 | Disposition: A | Discharge status: Home / Self Care | Payer: Medicare Other | Source: Ambulatory Visit | Attending: Pulmonary Disease | Admitting: Pulmonary Disease

## 2011-10-23 DIAGNOSIS — M25579 Pain in unspecified ankle and joints of unspecified foot: Secondary | ICD-10-CM | POA: Insufficient documentation

## 2011-10-23 DIAGNOSIS — X500XXA Overexertion from strenuous movement or load, initial encounter: Secondary | ICD-10-CM | POA: Insufficient documentation

## 2011-10-23 DIAGNOSIS — S8990XA Unspecified injury of unspecified lower leg, initial encounter: Secondary | ICD-10-CM | POA: Insufficient documentation

## 2011-10-23 DIAGNOSIS — M25571 Pain in right ankle and joints of right foot: Secondary | ICD-10-CM

## 2011-11-01 ENCOUNTER — Encounter (HOSPITAL_COMMUNITY): Payer: Self-pay | Admitting: Emergency Medicine

## 2011-11-01 ENCOUNTER — Emergency Department (HOSPITAL_COMMUNITY)
Admission: EM | Admit: 2011-11-01 | Discharge: 2011-11-01 | Disposition: A | Discharge status: Home / Self Care | Payer: Medicare Other | Attending: Emergency Medicine | Admitting: Emergency Medicine

## 2011-11-01 DIAGNOSIS — K625 Hemorrhage of anus and rectum: Secondary | ICD-10-CM

## 2011-11-01 DIAGNOSIS — Z79899 Other long term (current) drug therapy: Secondary | ICD-10-CM | POA: Insufficient documentation

## 2011-11-01 DIAGNOSIS — K6289 Other specified diseases of anus and rectum: Secondary | ICD-10-CM | POA: Insufficient documentation

## 2011-11-01 DIAGNOSIS — F341 Dysthymic disorder: Secondary | ICD-10-CM | POA: Insufficient documentation

## 2011-11-01 DIAGNOSIS — F17200 Nicotine dependence, unspecified, uncomplicated: Secondary | ICD-10-CM | POA: Insufficient documentation

## 2011-11-01 DIAGNOSIS — I10 Essential (primary) hypertension: Secondary | ICD-10-CM | POA: Insufficient documentation

## 2011-11-01 DIAGNOSIS — E785 Hyperlipidemia, unspecified: Secondary | ICD-10-CM | POA: Insufficient documentation

## 2011-11-01 DIAGNOSIS — Z8659 Personal history of other mental and behavioral disorders: Secondary | ICD-10-CM | POA: Insufficient documentation

## 2011-11-01 DIAGNOSIS — F319 Bipolar disorder, unspecified: Secondary | ICD-10-CM | POA: Insufficient documentation

## 2011-11-01 HISTORY — DX: Hyperlipidemia, unspecified: E78.5

## 2011-11-01 LAB — BASIC METABOLIC PANEL
GFR calc Af Amer: >90 mL/min (ref >90)
GFR calc non Af Amer: >90 mL/min (ref >90)
Potassium: 4.1 mEq/L (ref 3.5–5.1)
Sodium: 139 mEq/L (ref 135–145)

## 2011-11-01 LAB — DIFFERENTIAL
Basophils Absolute: 0 10*3/uL (ref 0.0–0.1)
Basophils Relative: 0 % (ref 0–1)
Eosinophils Absolute: 0.3 10*3/uL (ref 0.0–0.7)
Eosinophils Relative: 5 % (ref 0–5)
Lymphocytes Relative: 36 % (ref 12–46)
Lymphs Abs: 2.6 10*3/uL (ref 0.7–4.0)
Monocytes Absolute: 0.7 10*3/uL (ref 0.1–1.0)
Monocytes Relative: 9 % (ref 3–12)
Neutro Abs: 3.6 10*3/uL (ref 1.7–7.7)
Neutrophils Relative %: 50 % (ref 43–77)

## 2011-11-01 LAB — CBC
HCT: 37.2 % — ABNORMAL LOW (ref 39.0–52.0)
Hemoglobin: 12.8 g/dL — ABNORMAL LOW (ref 13.0–17.0)
MCH: 29.7 pg (ref 26.0–34.0)
MCHC: 34.4 g/dL (ref 30.0–36.0)
MCV: 86.3 fL (ref 78.0–100.0)
Platelets: 238 10*3/uL (ref 150–400)
RBC: 4.31 MIL/uL (ref 4.22–5.81)
RDW: 13.3 % (ref 11.5–15.5)
WBC: 7.2 10*3/uL (ref 4.0–10.5)

## 2011-11-01 NOTE — ED Notes (Signed)
Pt c/o rectal bleeding this am with bowel movement.

## 2011-11-01 NOTE — ED Provider Notes (Signed)
Scribed for Geoffery Lyons, MD, the patient was seen in room APA12/APA12 . This chart was scribed by Ellie Lunch.   CSN: [id number]  Arrival date & time 11/01/11  1418   First MD Initiated Contact with Patient 11/01/11 1513      Chief Complaint  Patient presents with  . Rectal Bleeding    (Consider location/radiation/quality/duration/timing/severity/associated sxs/prior treatment) HPI Pt seen at 3:17 PM  [name] is a 35 y.o. male who presents to the Emergency Department complaining of  1 episode of rectal bleeding this morning. Pt had BM today and observed bright red blood in the toilet. Pt denies straining during BM and usually has soft stool b/c he takes stool softners.   Pt denies h/o similar symptoms. Denies a h/o hemorrhoids. Pt denies any pain. Denies drinking or eating any bright red foods/liquids  Denies a fever or any other associated symptoms.   Past Medical History  Diagnosis Date  . Paranoid schizophrenia   . Bipolar 1 disorder   . Anxiety   . Hypertension   . Hyperlipidemia   depression  Past Surgical History  Procedure Date  . Cholecystectomy   . Ankle surgey   . Appendectomy     No family history on file.  History  Substance Use Topics  . Smoking status: Current Everyday Smoker -- 1.5 packs/day    Types: Cigarettes  . Smokeless tobacco: Not on file  . Alcohol Use: No  Pt lives at group home.     Review of Systems 10 Systems reviewed and are negative for acute change except as noted in the HPI.   Allergies  Compazine; Imitrex; Prochlorperazine edisylate; and Risperidone  Home Medications   Current Outpatient Rx  Name Route Sig Dispense Refill  . BENZTROPINE MESYLATE 0.5 MG PO TABS Oral Take 0.5 mg by mouth 2 (two) times daily.      Marland Kitchen BUPRENORPHINE 10 MCG/HR TD PTWK Transdermal Place 1 patch onto the skin once a week. Remove and discard used patches     . DULOXETINE HCL 30 MG PO CPEP Oral Take 30 mg by mouth every morning.      .  DULOXETINE HCL 60 MG PO CPEP Oral Take 60 mg by mouth every morning.      Marland Kitchen GABAPENTIN 300 MG PO CAPS Oral Take 300 mg by mouth 3 (three) times daily.      Marland Kitchen HALOPERIDOL 10 MG PO TABS Oral Take 10 mg by mouth 2 (two) times daily.      Marland Kitchen HALOPERIDOL DECANOATE 100 MG/ML IM SOLN Intramuscular Inject 100 mg into the muscle every 28 (twenty-eight) days.      . MELOXICAM 15 MG PO TABS Oral Take 15 mg by mouth daily.      Marland Kitchen METOPROLOL TARTRATE 50 MG PO TABS Oral Take 50 mg by mouth 2 (two) times daily.      Carma Leaven M PLUS PO TABS Oral Take 1 tablet by mouth daily.      Marland Kitchen NIACIN (ANTIHYPERLIPIDEMIC) 500 MG PO TBCR Oral Take 500 mg by mouth at bedtime.      . OMEPRAZOLE 40 MG PO CPDR Oral Take 40 mg by mouth daily.      Frazier Butt OP Ophthalmic Apply 1 drop to eye 4 (four) times daily.      . QUETIAPINE FUMARATE 300 MG PO TABS Oral Take 300 mg by mouth 2 (two) times daily.      . TRAZODONE HCL 150 MG PO TABS Oral Take 300  mg by mouth at bedtime.        BP 125/54  Pulse 76  Temp(Src) 97.3 F (36.3 C) (Oral)  Resp 17  Ht 6\' 2"  (1.88 m)  Wt 290 lb (131.543 kg)  BMI 37.23 kg/m2  SpO2 95%  Physical Exam  Nursing note and vitals reviewed. Constitutional: He is oriented to person, place, and time. He appears well-developed and well-nourished.  HENT:  Head: Normocephalic and atraumatic.  Eyes: Conjunctivae and EOM are normal.  Neck: Normal range of motion. Neck supple.  Cardiovascular: Normal rate, regular rhythm and normal heart sounds.   Pulmonary/Chest: Effort normal and breath sounds normal. No respiratory distress.  Abdominal: Soft. Bowel sounds are normal. There is no tenderness.       No CVA tenderness  Genitourinary:       No hemorrhoids. Exquisitely tender. Brown stool heme positive. No masses.   Neurological: He is alert and oriented to person, place, and time.  Skin: Skin is warm and dry.  Psychiatric: He has a normal mood and affect.    ED Course  Procedures (including critical  care time) DIAGNOSTIC STUDIES: Oxygen Saturation is 95% on RA, adequate by my interpretation.    COORDINATION OF CARE:   Labs Reviewed  CBC - Abnormal; Notable for the following:    Hemoglobin 12.8 (*)    HCT 37.2 (*)    All other components within normal limits  BASIC METABOLIC PANEL - Abnormal; Notable for the following:    Glucose, Bld 101 (*)    All other components within normal limits  DIFFERENTIAL   3:21 PM EDP discussed with PT plan to check blood count.   No diagnosis found.    MDM  Hemoglobin stable.  Bleeding is most likely a fissure due to the tenderness on exam.  Will discharge with stool softeners and follow up with GI.  To return prn.  I personally performed the services described in this documentation, which was scribed in my presence. The recorded information has been reviewed and considered.         Geoffery Lyons, MD 11/01/11 [phone]

## 2011-11-04 LAB — OCCULT BLOOD, POC DEVICE: Fecal Occult Bld: POSITIVE

## 2011-11-28 ENCOUNTER — Encounter (HOSPITAL_COMMUNITY): Payer: Self-pay | Admitting: *Deleted

## 2011-11-28 ENCOUNTER — Emergency Department (HOSPITAL_COMMUNITY)
Admission: EM | Admit: 2011-11-28 | Discharge: 2011-11-28 | Disposition: A | Discharge status: Home / Self Care | Payer: Medicare Other | Attending: Emergency Medicine | Admitting: Emergency Medicine

## 2011-11-28 DIAGNOSIS — Z8659 Personal history of other mental and behavioral disorders: Secondary | ICD-10-CM | POA: Insufficient documentation

## 2011-11-28 DIAGNOSIS — R21 Rash and other nonspecific skin eruption: Secondary | ICD-10-CM | POA: Diagnosis not present

## 2011-11-28 DIAGNOSIS — K219 Gastro-esophageal reflux disease without esophagitis: Secondary | ICD-10-CM | POA: Insufficient documentation

## 2011-11-28 DIAGNOSIS — R61 Generalized hyperhidrosis: Secondary | ICD-10-CM | POA: Diagnosis not present

## 2011-11-28 DIAGNOSIS — E785 Hyperlipidemia, unspecified: Secondary | ICD-10-CM | POA: Diagnosis not present

## 2011-11-28 DIAGNOSIS — Z79899 Other long term (current) drug therapy: Secondary | ICD-10-CM | POA: Insufficient documentation

## 2011-11-28 DIAGNOSIS — I10 Essential (primary) hypertension: Secondary | ICD-10-CM | POA: Diagnosis not present

## 2011-11-28 DIAGNOSIS — M79609 Pain in unspecified limb: Secondary | ICD-10-CM | POA: Diagnosis not present

## 2011-11-28 DIAGNOSIS — F319 Bipolar disorder, unspecified: Secondary | ICD-10-CM | POA: Diagnosis not present

## 2011-11-28 DIAGNOSIS — L309 Dermatitis, unspecified: Secondary | ICD-10-CM

## 2011-11-28 DIAGNOSIS — L259 Unspecified contact dermatitis, unspecified cause: Secondary | ICD-10-CM | POA: Insufficient documentation

## 2011-11-28 HISTORY — DX: Gastro-esophageal reflux disease without esophagitis: K21.9

## 2011-11-28 MED ORDER — TRIAMCINOLONE ACETONIDE 0.1 % EX CREA: TOPICAL_CREAM | Freq: Three times a day (TID) | CUTANEOUS | Status: DC

## 2011-11-28 MED ORDER — HYDROCODONE-ACETAMINOPHEN 5-325 MG PO TABS
1.0000 | ORAL_TABLET | Freq: Once | ORAL | Status: AC
  Administered 2011-11-28: 1 via ORAL
  Filled 2011-11-28: qty 1

## 2011-11-28 NOTE — ED Notes (Signed)
Pt c/o rash to hands for three weeks, pt also states that he has been having problems with "night sweats", pt denies any cough, abd pain, fever, n/v, pt states that he will wake up and "be sweaty". Pt states that he has been having the night sweats for a few days,

## 2011-11-30 NOTE — ED Provider Notes (Signed)
History     CSN: [id number]  Arrival date & time 11/28/11  1202   First MD Initiated Contact with Patient 11/28/11 1231      Chief Complaint  Patient presents with  . Rash  . Night Sweats    (Consider location/radiation/quality/duration/timing/severity/associated sxs/prior treatment) HPI Comments: Patient c/o stinging pain to his fingers, hands and rash for three weeks.  He states he has not tried any otc creams or seen his PMD.  He also c/o "night sweats" for several nights.  He denies recent illness, fever, weight loss, hemoptysis or chills.  Patient is a 36 y.o. male presenting with rash. The history is provided by the patient. No language interpreter was used.  Rash  This is a chronic problem. The current episode started more than 1 week ago. The problem has not changed since onset.The problem is associated with an unknown factor. There has been no fever. The rash is present on the left hand and right hand. The patient is experiencing no pain. Associated symptoms include pain. Pertinent negatives include no blisters, no itching and no weeping. He has tried nothing for the symptoms. The treatment provided no relief.    Past Medical History  Diagnosis Date  . Paranoid schizophrenia   . Bipolar 1 disorder   . Anxiety   . Hypertension   . Hyperlipidemia   . GERD (gastroesophageal reflux disease)   . Alcohol abuse     Past Surgical History  Procedure Date  . Cholecystectomy   . Ankle surgey   . Appendectomy     No family history on file.  History  Substance Use Topics  . Smoking status: Current Everyday Smoker -- 1.5 packs/day    Types: Cigarettes  . Smokeless tobacco: Not on file  . Alcohol Use: No      Review of Systems  Constitutional: Negative for fever, chills, activity change, appetite change and unexpected weight change.  Eyes: Negative for visual disturbance.  Respiratory: Negative for cough, shortness of breath, wheezing and stridor.     Gastrointestinal: Negative for nausea and vomiting.  Musculoskeletal: Negative for myalgias, joint swelling and arthralgias.  Skin: Positive for rash. Negative for itching and wound.  Neurological: Negative for dizziness, weakness, numbness and headaches.  All other systems reviewed and are negative.    Allergies  Compazine; Imitrex; Prochlorperazine edisylate; and Risperidone  Home Medications   Current Outpatient Rx  Name Route Sig Dispense Refill  . BENZTROPINE MESYLATE 0.5 MG PO TABS Oral Take 0.5 mg by mouth 2 (two) times daily.      Marland Kitchen BUPRENORPHINE 10 MCG/HR TD PTWK Transdermal Place 1 patch onto the skin once a week. Remove and discard used patches     . DULOXETINE HCL 30 MG PO CPEP Oral Take 30 mg by mouth every morning. Takes 30 mg and 60 mg to make 90 mg    . DULOXETINE HCL 60 MG PO CPEP Oral Take 60 mg by mouth every morning. Takes 30 mg and 60 mg to make 90 mg    . GABAPENTIN 800 MG PO TABS Oral Take 800 mg by mouth 4 (four) times daily.    Marland Kitchen HALOPERIDOL 10 MG PO TABS Oral Take 10 mg by mouth 2 (two) times daily.     Marland Kitchen HYDROXYZINE PAMOATE 100 MG PO CAPS Oral Take 100 mg by mouth 3 (three) times daily as needed. anxiety    . MELOXICAM 15 MG PO TABS Oral Take 15 mg by mouth daily.      Marland Kitchen  METOPROLOL TARTRATE 50 MG PO TABS Oral Take 50 mg by mouth 2 (two) times daily.      Carma Leaven M PLUS PO TABS Oral Take 1 tablet by mouth daily.      Marland Kitchen NIACIN ER (ANTIHYPERLIPIDEMIC) 500 MG PO TBCR Oral Take 500 mg by mouth at bedtime.      . OMEPRAZOLE 40 MG PO CPDR Oral Take 40 mg by mouth daily.      Frazier Butt OP Ophthalmic Apply 1 drop to eye 4 (four) times daily.      . QUETIAPINE FUMARATE 300 MG PO TABS Oral Take 300 mg by mouth at bedtime.     . TRAZODONE HCL 150 MG PO TABS Oral Take 300 mg by mouth at bedtime.      Marland Kitchen HALOPERIDOL DECANOATE 100 MG/ML IM SOLN Intramuscular Inject 100 mg into the muscle every 28 (twenty-eight) days.      . TRIAMCINOLONE ACETONIDE 0.1 % EX CREA Topical  Apply topically 3 (three) times daily. 30 g 0    BP 123/64  Pulse 66  Temp(Src) 97.9 F (36.6 C) (Oral)  Resp 18  Ht 6' (1.829 m)  Wt 300 lb (136.079 kg)  BMI 40.69 kg/m2  SpO2 96%  Physical Exam  Nursing note and vitals reviewed. Constitutional: He is oriented to person, place, and time. He appears well-developed and well-nourished. No distress.  HENT:  Head: Normocephalic and atraumatic.  Mouth/Throat: Oropharynx is clear and moist.  Eyes: EOM are normal. Pupils are equal, round, and reactive to light.  Neck: Normal range of motion. Neck supple.  Cardiovascular: Normal rate, regular rhythm, normal heart sounds and intact distal pulses.   No murmur heard. Pulmonary/Chest: Effort normal and breath sounds normal. No respiratory distress. He has no wheezes. He has no rales.  Musculoskeletal: He exhibits no edema and no tenderness.  Lymphadenopathy:    He has no cervical adenopathy.  Neurological: He is alert and oriented to person, place, and time. He exhibits normal muscle tone. Coordination normal.  Skin: Skin is warm. Rash noted. Rash is papular. No cyanosis or erythema. Nails show no clubbing.       Papular rash to the palmar surface of both hands and fingers with peeling of the skin.  No vesicles, cynosis or edema    ED Course  Procedures (including critical care time)  Labs Reviewed - No data to display No results found.   1. Dermatitis       MDM   Pt is non-toxic appearing. Vitals stable.  No hemoptysis , cough, weight loss.  Clinical suspicion for tb is low.   Patient was also seen by EDP.         Lalana Wachter L. Lela Murfin, [place] 11/30/11 2320

## 2011-12-04 NOTE — ED Provider Notes (Signed)
Medical screening examination/treatment/procedure(s) were performed by non-physician practitioner and as supervising physician I was immediately available for consultation/collaboration.  Palmar rash most consistent with eczema. Does not look like scabies or syphilis  Donnetta Hutching, MD 12/04/11 [phone]

## 2011-12-22 ENCOUNTER — Emergency Department (HOSPITAL_COMMUNITY)
Admission: EM | Admit: 2011-12-22 | Discharge: 2011-12-23 | Disposition: A | Discharge status: Home / Self Care | Payer: Medicare Other | Attending: Emergency Medicine | Admitting: Emergency Medicine

## 2011-12-22 ENCOUNTER — Emergency Department (HOSPITAL_COMMUNITY): Payer: Medicare Other

## 2011-12-22 ENCOUNTER — Other Ambulatory Visit: Payer: Self-pay

## 2011-12-22 ENCOUNTER — Encounter (HOSPITAL_COMMUNITY): Payer: Self-pay

## 2011-12-22 DIAGNOSIS — R079 Chest pain, unspecified: Secondary | ICD-10-CM | POA: Insufficient documentation

## 2011-12-22 DIAGNOSIS — E785 Hyperlipidemia, unspecified: Secondary | ICD-10-CM | POA: Diagnosis not present

## 2011-12-22 DIAGNOSIS — F411 Generalized anxiety disorder: Secondary | ICD-10-CM | POA: Insufficient documentation

## 2011-12-22 DIAGNOSIS — R9431 Abnormal electrocardiogram [ECG] [EKG]: Secondary | ICD-10-CM | POA: Insufficient documentation

## 2011-12-22 DIAGNOSIS — R0602 Shortness of breath: Secondary | ICD-10-CM | POA: Insufficient documentation

## 2011-12-22 DIAGNOSIS — F17200 Nicotine dependence, unspecified, uncomplicated: Secondary | ICD-10-CM | POA: Insufficient documentation

## 2011-12-22 DIAGNOSIS — R071 Chest pain on breathing: Secondary | ICD-10-CM | POA: Diagnosis not present

## 2011-12-22 DIAGNOSIS — I10 Essential (primary) hypertension: Secondary | ICD-10-CM | POA: Insufficient documentation

## 2011-12-22 DIAGNOSIS — K219 Gastro-esophageal reflux disease without esophagitis: Secondary | ICD-10-CM | POA: Insufficient documentation

## 2011-12-22 DIAGNOSIS — F200 Paranoid schizophrenia: Secondary | ICD-10-CM | POA: Diagnosis not present

## 2011-12-22 DIAGNOSIS — F319 Bipolar disorder, unspecified: Secondary | ICD-10-CM | POA: Insufficient documentation

## 2011-12-22 DIAGNOSIS — R0789 Other chest pain: Secondary | ICD-10-CM

## 2011-12-22 LAB — BASIC METABOLIC PANEL
BUN: 8 mg/dL (ref 6–23)
CO2: 26 mEq/L (ref 19–32)
Chloride: 103 mEq/L (ref 96–112)
GFR calc Af Amer: >90 mL/min (ref >90)
Potassium: 3.7 mEq/L (ref 3.5–5.1)

## 2011-12-22 MED ORDER — ALBUTEROL SULFATE (5 MG/ML) 0.5% IN NEBU
5.0000 mg | INHALATION_SOLUTION | Freq: Once | RESPIRATORY_TRACT | Status: AC
  Administered 2011-12-22: 5 mg via RESPIRATORY_TRACT
  Filled 2011-12-22: qty 1

## 2011-12-22 NOTE — ED Provider Notes (Signed)
History     CSN: [id number]  Arrival date & time 12/22/11  2202   First MD Initiated Contact with Patient 12/22/11 2253      Chief Complaint  Patient presents with  . Shortness of Breath    (Consider location/radiation/quality/duration/timing/severity/associated sxs/prior treatment) HPI Comments: Patient presents with left-sided pleuritic chest pain and shortness of breath that started this morning. Feels like he cannot take a full breath and it hurts to breathe. He feels better he does not very shallow breath. Denies any cough, fever, anterior chest pain, nausea, vomiting or abdominal pain. No history of thromboembolic disease. No leg pain or swelling. No sore throat, difficulty swallowing. No rash.  Patient is a 36 y.o. male presenting with shortness of breath. The history is provided by the patient.  Shortness of Breath  The current episode started today. The problem occurs frequently. The problem has been unchanged. The problem is mild. The symptoms are aggravated by nothing. Associated symptoms include chest pain and shortness of breath. Pertinent negatives include no fever, no rhinorrhea, no sore throat and no cough.    Past Medical History  Diagnosis Date  . Paranoid schizophrenia   . Bipolar 1 disorder   . Anxiety   . Hypertension   . Hyperlipidemia   . GERD (gastroesophageal reflux disease)   . Alcohol abuse     Past Surgical History  Procedure Date  . Cholecystectomy   . Ankle surgey   . Appendectomy     No family history on file.  History  Substance Use Topics  . Smoking status: Current Everyday Smoker -- 1.0 packs/day    Types: Cigarettes  . Smokeless tobacco: Not on file  . Alcohol Use: No      Review of Systems  Constitutional: Negative for fever, activity change and appetite change.  HENT: Negative for congestion, sore throat and rhinorrhea.   Eyes: Negative for visual disturbance.  Respiratory: Positive for shortness of breath. Negative for  cough.   Cardiovascular: Positive for chest pain.  Gastrointestinal: Negative for nausea, vomiting and abdominal pain.  Genitourinary: Negative for dysuria and hematuria.  Musculoskeletal: Negative for back pain.  Skin: Negative for rash.  Neurological: Negative for numbness and headaches.  Hematological: Negative for adenopathy.    Allergies  Compazine; Imitrex; Prochlorperazine edisylate; and Risperidone  Home Medications   Current Outpatient Rx  Name Route Sig Dispense Refill  . BENZTROPINE MESYLATE 0.5 MG PO TABS Oral Take 0.5 mg by mouth 2 (two) times daily.      Marland Kitchen BUPRENORPHINE 10 MCG/HR TD PTWK Transdermal Place 1 patch onto the skin once a week. Remove and discard used patches     . DULOXETINE HCL 60 MG PO CPEP Oral Take 60 mg by mouth every morning. Takes 30 mg and 60 mg to make 90 mg    . GABAPENTIN 300 MG PO CAPS Oral Take 300 mg by mouth 3 (three) times daily.    Marland Kitchen HALOPERIDOL 10 MG PO TABS Oral Take 10 mg by mouth 2 (two) times daily.     Marland Kitchen HALOPERIDOL DECANOATE 100 MG/ML IM SOLN Intramuscular Inject 100 mg into the muscle every 28 (twenty-eight) days. Had injection on February 7th..    . MELOXICAM 15 MG PO TABS Oral Take 15 mg by mouth daily.      Marland Kitchen METOPROLOL TARTRATE 50 MG PO TABS Oral Take 50 mg by mouth 2 (two) times daily.      Carma Leaven M PLUS PO TABS Oral Take 1 tablet  by mouth daily.      Marland Kitchen NIACIN ER (ANTIHYPERLIPIDEMIC) 500 MG PO TBCR Oral Take 500 mg by mouth at bedtime.      . OMEPRAZOLE 40 MG PO CPDR Oral Take 40 mg by mouth daily.      Frazier Butt OP Ophthalmic Apply 1 drop to eye 4 (four) times daily.      . QUETIAPINE FUMARATE 300 MG PO TABS Oral Take 300 mg by mouth 2 (two) times daily.     . TRAZODONE HCL 150 MG PO TABS Oral Take 300 mg by mouth at bedtime.      . IBUPROFEN 800 MG PO TABS Oral Take 1 tablet (800 mg total) by mouth 3 (three) times daily. 21 tablet 0    BP 134/66  Pulse 64  Temp(Src) 97.7 F (36.5 C) (Oral)  Resp 18  Ht 6\' 2"  (1.88 m)   Wt 300 lb (136.079 kg)  BMI 38.52 kg/m2  SpO2 99%  Physical Exam  Constitutional: He is oriented to person, place, and time. He appears well-developed and well-nourished. No distress.  HENT:  Head: Normocephalic and atraumatic.  Mouth/Throat: Oropharynx is clear and moist. No oropharyngeal exudate.  Eyes: Conjunctivae are normal. Pupils are equal, round, and reactive to light.  Neck: Normal range of motion.  Cardiovascular: Normal rate, regular rhythm and normal heart sounds.   No murmur heard. Pulmonary/Chest: Effort normal and breath sounds normal. He exhibits tenderness.       Reproducible left chest wall tenderness, no rash  Abdominal: Soft. There is no tenderness. There is no rebound and no guarding.  Musculoskeletal: Normal range of motion. He exhibits no edema and no tenderness.  Neurological: He is alert and oriented to person, place, and time. No cranial nerve deficit.  Skin: Skin is warm.    ED Course  Procedures (including critical care time)   Labs Reviewed  D-DIMER, QUANTITATIVE  BASIC METABOLIC PANEL   Dg Chest 2 View  12/23/2011  *RADIOLOGY REPORT*  Clinical Data: Shortness of breath for 1 day  CHEST - 2 VIEW  Comparison: 09/22/2010  Findings: Normal heart size and pulmonary vascularity.  Lungs appear clear expanded.  No focal airspace consolidation.  No blunting of costophrenic angles.  No pneumothorax.  No significant change since previous study.  IMPRESSION: No evidence of active pulmonary disease.  Original Report Authenticated By: Marlon Pel, M.D.     1. Chest wall pain       MDM  Pleuritic left-sided chest pain or shortness of breath. Vital signs stable, no distress. Lungs clear. Chest wall pain reproducible. No evidence of rash. Check x-ray, EKG and d-dimer.    Date: 12/22/2011  Rate: 64  Rhythm: normal sinus rhythm  QRS Axis: normal  Intervals: PR prolonged  ST/T Wave abnormalities: normal  Conduction Disutrbances:none  Narrative  Interpretation:   Old EKG Reviewed: unchanged   Reproducible left chest wall pain. Chest x-ray negative, d-dimer negative. No tachycardia, hypoxia, tachypnea. Patient instructed to followup with his doctor this week.  Chest wall pain may represent early zoster without rash yet visible.  Glynn Octave, MD 12/23/11 [phone]

## 2011-12-22 NOTE — ED Notes (Signed)
Pt presents with "shortness of breath" today. Pt states he has to take a deep breath in order to breath. Per EMS all VS stable. No signs of distress at this time.

## 2011-12-23 MED ORDER — IBUPROFEN 800 MG PO TABS: 800.0000 mg | ORAL_TABLET | Freq: Three times a day (TID) | ORAL | Status: DC

## 2011-12-25 ENCOUNTER — Emergency Department (HOSPITAL_COMMUNITY)
Admission: EM | Admit: 2011-12-25 | Discharge: 2011-12-25 | Disposition: A | Discharge status: Home / Self Care | Payer: Medicare Other | Attending: Emergency Medicine | Admitting: Emergency Medicine

## 2011-12-25 ENCOUNTER — Encounter (HOSPITAL_COMMUNITY): Payer: Self-pay | Admitting: Emergency Medicine

## 2011-12-25 ENCOUNTER — Emergency Department (HOSPITAL_COMMUNITY): Payer: Medicare Other

## 2011-12-25 ENCOUNTER — Other Ambulatory Visit: Payer: Self-pay

## 2011-12-25 DIAGNOSIS — F17200 Nicotine dependence, unspecified, uncomplicated: Secondary | ICD-10-CM | POA: Insufficient documentation

## 2011-12-25 DIAGNOSIS — Z9889 Other specified postprocedural states: Secondary | ICD-10-CM | POA: Diagnosis not present

## 2011-12-25 DIAGNOSIS — K219 Gastro-esophageal reflux disease without esophagitis: Secondary | ICD-10-CM | POA: Insufficient documentation

## 2011-12-25 DIAGNOSIS — R10812 Left upper quadrant abdominal tenderness: Secondary | ICD-10-CM | POA: Insufficient documentation

## 2011-12-25 DIAGNOSIS — F1021 Alcohol dependence, in remission: Secondary | ICD-10-CM | POA: Diagnosis not present

## 2011-12-25 DIAGNOSIS — R112 Nausea with vomiting, unspecified: Secondary | ICD-10-CM | POA: Diagnosis not present

## 2011-12-25 DIAGNOSIS — F200 Paranoid schizophrenia: Secondary | ICD-10-CM | POA: Diagnosis not present

## 2011-12-25 DIAGNOSIS — F319 Bipolar disorder, unspecified: Secondary | ICD-10-CM | POA: Insufficient documentation

## 2011-12-25 DIAGNOSIS — F411 Generalized anxiety disorder: Secondary | ICD-10-CM | POA: Insufficient documentation

## 2011-12-25 DIAGNOSIS — R141 Gas pain: Secondary | ICD-10-CM | POA: Diagnosis not present

## 2011-12-25 DIAGNOSIS — R143 Flatulence: Secondary | ICD-10-CM | POA: Diagnosis not present

## 2011-12-25 DIAGNOSIS — R1012 Left upper quadrant pain: Secondary | ICD-10-CM | POA: Insufficient documentation

## 2011-12-25 DIAGNOSIS — I10 Essential (primary) hypertension: Secondary | ICD-10-CM | POA: Diagnosis not present

## 2011-12-25 DIAGNOSIS — R079 Chest pain, unspecified: Secondary | ICD-10-CM | POA: Diagnosis not present

## 2011-12-25 LAB — BASIC METABOLIC PANEL
BUN: 12 mg/dL (ref 6–23)
Chloride: 100 mEq/L (ref 96–112)
GFR calc non Af Amer: >90 mL/min (ref >90)
Glucose, Bld: 120 mg/dL — ABNORMAL HIGH (ref 70–99)
Potassium: 4.1 mEq/L (ref 3.5–5.1)

## 2011-12-25 LAB — CBC
HCT: 37 % — ABNORMAL LOW (ref 39.0–52.0)
Hemoglobin: 12.7 g/dL — ABNORMAL LOW (ref 13.0–17.0)
MCHC: 34.3 g/dL (ref 30.0–36.0)
MCV: 84.3 fL (ref 78.0–100.0)

## 2011-12-25 LAB — URINALYSIS, ROUTINE W REFLEX MICROSCOPIC
Bilirubin Urine: NEGATIVE
Hgb urine dipstick: NEGATIVE
Specific Gravity, Urine: 1.01 (ref 1.005–1.030)
pH: 6 (ref 5.0–8.0)

## 2011-12-25 MED ORDER — PROMETHAZINE HCL 25 MG PO TABS: 12.5000 mg | ORAL_TABLET | Freq: Four times a day (QID) | ORAL | Status: AC | PRN

## 2011-12-25 MED ORDER — SODIUM CHLORIDE 0.9 % IV BOLUS (SEPSIS)
1000.0000 mL | Freq: Once | INTRAVENOUS | Status: AC
  Administered 2011-12-25: 1000 mL via INTRAVENOUS

## 2011-12-25 MED ORDER — SODIUM CHLORIDE 0.9 % IV SOLN: Freq: Once | INTRAVENOUS | Status: DC

## 2011-12-25 MED ORDER — ONDANSETRON HCL 4 MG/2ML IJ SOLN
4.0000 mg | Freq: Once | INTRAMUSCULAR | Status: AC
  Administered 2011-12-25: 4 mg via INTRAVENOUS
  Filled 2011-12-25: qty 2

## 2011-12-25 MED ORDER — HYDROMORPHONE HCL PF 1 MG/ML IJ SOLN
1.0000 mg | Freq: Once | INTRAMUSCULAR | Status: AC
  Administered 2011-12-25: 1 mg via INTRAVENOUS
  Filled 2011-12-25: qty 1

## 2011-12-25 NOTE — ED Notes (Signed)
Pt c/o cp since Sunday-worse with deep breath-pt seen in ed Sunday for same.  Pt also c/o n/v today.

## 2011-12-25 NOTE — Discharge Instructions (Signed)
You will need to have a few bowel movements to clear the gas and stool from your colon. Use Miralax daily until you have had several bowel movements. Use the nausea medicine as needed.

## 2011-12-25 NOTE — ED Provider Notes (Cosign Needed Addendum)
History   This chart was scribed for [employer]. Colon Branch, MD by Sofie Rower. The patient was seen in room [facility] and the patient's care was started at 3:27PM.    CSN: [id number]  Arrival date & time 12/25/11  1406   First MD Initiated Contact with Patient 12/25/11 1502      Chief Complaint  Patient presents with  . Chest Pain  . Emesis  . Nausea    (Consider location/radiation/quality/duration/timing/severity/associated sxs/prior treatment) HPI  [name] is a 36 y.o. male who presents to the Emergency Department complaining of moderate, constant chest pain onset four days ago with associated symptoms of nausea, vomiting, difficulty swallowing. Modifying factors include deep breathing which intensifies the pain.   Pt denies diarrhea, fever, chills, abd pain.   PCP is Dr. Juanetta Gosling.  Past Medical History  Diagnosis Date  . Paranoid schizophrenia   . Bipolar 1 disorder   . Anxiety   . Hypertension   . Hyperlipidemia   . GERD (gastroesophageal reflux disease)   . Alcohol abuse     Past Surgical History  Procedure Date  . Cholecystectomy   . Ankle surgey   . Appendectomy     History reviewed. No pertinent family history.  History  Substance Use Topics  . Smoking status: Current Everyday Smoker -- 1.0 packs/day    Types: Cigarettes  . Smokeless tobacco: Not on file  . Alcohol Use: No      Review of Systems  All other systems reviewed and are negative.   10 Systems reviewed and are negative for acute change except as noted in the HPI.   Allergies  Compazine; Imitrex; Prochlorperazine edisylate; and Risperidone  Home Medications   Current Outpatient Rx  Name Route Sig Dispense Refill  . BENZTROPINE MESYLATE 0.5 MG PO TABS Oral Take 0.5 mg by mouth 2 (two) times daily.      Marland Kitchen BUPRENORPHINE 10 MCG/HR TD PTWK Transdermal Place 1 patch onto the skin once a week. Remove and discard used patches     . DULOXETINE HCL 60 MG PO CPEP Oral Take 60 mg by  mouth every morning.     Marland Kitchen GABAPENTIN 300 MG PO CAPS Oral Take 300 mg by mouth 3 (three) times daily.    Marland Kitchen HALOPERIDOL 10 MG PO TABS Oral Take 10 mg by mouth 2 (two) times daily.     Marland Kitchen HALOPERIDOL DECANOATE 100 MG/ML IM SOLN Intramuscular Inject 100 mg into the muscle every 28 (twenty-eight) days. Had injection on February 7th..    . MELOXICAM 15 MG PO TABS Oral Take 15 mg by mouth daily.      Marland Kitchen METOPROLOL TARTRATE 50 MG PO TABS Oral Take 50 mg by mouth 2 (two) times daily.      Carma Leaven M PLUS PO TABS Oral Take 1 tablet by mouth daily.      Marland Kitchen NIACIN ER (ANTIHYPERLIPIDEMIC) 500 MG PO TBCR Oral Take 500 mg by mouth at bedtime.      . OMEPRAZOLE 40 MG PO CPDR Oral Take 40 mg by mouth daily.      Frazier Butt OP Ophthalmic Apply 1 drop to eye 4 (four) times daily.      . QUETIAPINE FUMARATE 200 MG PO TABS Oral Take 200 mg by mouth 4 (four) times daily.    . TRAZODONE HCL 150 MG PO TABS Oral Take 300 mg by mouth at bedtime.        BP 140/61  Pulse 73  Temp 97.8  F (36.6 C)  Resp 20  Ht 6\' 2"  (1.88 m)  Wt 300 lb (136.079 kg)  BMI 38.52 kg/m2  SpO2 98%  Physical Exam  Nursing note and vitals reviewed. Constitutional: He is oriented to person, place, and time. He appears well-developed and well-nourished.  HENT:  Head: Normocephalic and atraumatic.  Nose: Nose normal.       Edentulous.  Eyes: Conjunctivae and EOM are normal. No scleral icterus.  Neck: Normal range of motion. Neck supple. No thyromegaly present.  Cardiovascular: Normal rate, regular rhythm and normal heart sounds.  Exam reveals no gallop and no friction rub.   No murmur heard. Pulmonary/Chest: Effort normal and breath sounds normal. No stridor. He has no wheezes. He has no rales. He exhibits no tenderness.       Left chest wall pain. LUQ and costal margin tenderness.  Abdominal: Soft. Bowel sounds are normal. He exhibits no distension. There is no tenderness (LUQ and costal margin tenderness.). There is no rebound.    Musculoskeletal: Normal range of motion. He exhibits no edema.       Serpintine incision from the elbow to the wrist, additional small horizontal incision at the wrist, all are sutured, no gapping, edges are well approximated. No swelling, no erythema, no current drainage.   Lymphadenopathy:    He has no cervical adenopathy.  Neurological: He is alert and oriented to person, place, and time. Coordination normal.  Skin: Skin is warm and dry. No rash noted. No erythema.  Psychiatric: He has a normal mood and affect. His behavior is normal.    ED Course  Procedures (including critical care time)  DIAGNOSTIC STUDIES: Oxygen Saturation is 98% on room air, normal by my interpretation.    COORDINATION OF CARE:  Results for orders placed during the hospital encounter of 12/25/11  CBC      Component Value Range   WBC 7.2  4.0 - 10.5 (K/uL)   RBC 4.39  4.22 - 5.81 (MIL/uL)   Hemoglobin 12.7 (*) 13.0 - 17.0 (g/dL)   HCT 96.0 (*) 45.4 - 52.0 (%)   MCV 84.3  78.0 - 100.0 (fL)   MCH 28.9  26.0 - 34.0 (pg)   MCHC 34.3  30.0 - 36.0 (g/dL)   RDW 09.8  11.9 - 14.7 (%)   Platelets 258  150 - 400 (K/uL)  BASIC METABOLIC PANEL      Component Value Range   Sodium 136  135 - 145 (mEq/L)   Potassium 4.1  3.5 - 5.1 (mEq/L)   Chloride 100  96 - 112 (mEq/L)   CO2 27  19 - 32 (mEq/L)   Glucose, Bld 120 (*) 70 - 99 (mg/dL)   BUN 12  6 - 23 (mg/dL)   Creatinine, Ser 8.29  0.50 - 1.35 (mg/dL)   Calcium 9.5  8.4 - 56.2 (mg/dL)   GFR calc non Af Amer >90  >90 (mL/min)   GFR calc Af Amer >90  >90 (mL/min)     Dg Abd Acute W/chest  12/25/2011  *RADIOLOGY REPORT*  Clinical Data: Chest pain.  Nausea and vomiting.  Hypertension.  ACUTE ABDOMEN SERIES (ABDOMEN 2 VIEW & CHEST 1 VIEW)  Comparison: 11/18/2009  Findings: There is a large amount of fecal matter within the colon. There is a large amount of gas in the descending colon on the left. Small bowel gas pattern is normal.  There are clips in the right  upper quadrant related to previous cholecystectomy.  No soft tissue calcifications  are seen.  Bony structures are unremarkable.  No sign of free air.  One-view chest shows some left ventricular prominence.  Mediastinal shadows are normal.  Lungs are clear.  The vascularity is normal. No free air seen under the diaphragm.  IMPRESSION: Large amount of stool in the colon.  Prominent gas in the left colon.  No evidence of obstruction or free air however.  Original Report Authenticated By: Thomasenia Sales, M.D.      3:30PM- EDP at bedside discusses treatment plan concerning blood work, urine sample, x-ray, and pain management.   5:50PM- EDP at bedside consults pt about large amount of stool found in his colon in review of  laboratory results. EDP irecommends Mira lax .  MDM  Patient who presents with nausea, vomiting and left upper abdominal pain at costal margin. Xray with evidence of large stool burden and gas. Received IVF, analgesic and antiemetic with moderate relief. Has had PO fluids. Will send patient back to the family home with orders for use of Mira lax and Rx for antiemetic.Pt stable in ED with no significant deterioration in condition.The patient appears reasonably screened and/or stabilized for discharge and I doubt any other medical condition or other [facility] requiring further screening, evaluation, or treatment in the ED at this time prior to discharge.  I personally performed the services described in this documentation, which was scribed in my presence. The recorded information has been reviewed and considered.  MDM Reviewed: nursing note and vitals Interpretation: labs and x-ray          Nicoletta Dress. Colon Branch, MD 12/25/11 1806  Nicoletta Dress. Colon Branch, MD 12/25/11 2042

## 2012-01-06 ENCOUNTER — Emergency Department (HOSPITAL_COMMUNITY)
Admission: EM | Admit: 2012-01-06 | Discharge: 2012-01-06 | Disposition: A | Discharge status: Home / Self Care | Payer: Medicare Other | Source: Home / Self Care | Attending: Emergency Medicine | Admitting: Emergency Medicine

## 2012-01-06 ENCOUNTER — Encounter (HOSPITAL_COMMUNITY): Payer: Self-pay | Admitting: *Deleted

## 2012-01-06 ENCOUNTER — Emergency Department (HOSPITAL_COMMUNITY): Payer: Medicare Other

## 2012-01-06 ENCOUNTER — Inpatient Hospital Stay (HOSPITAL_COMMUNITY)
Admission: EM | Admit: 2012-01-06 | Discharge: 2012-01-08 | DRG: 153 | Disposition: A | Discharge status: Home / Self Care | Payer: Medicare Other | Attending: Otolaryngology | Admitting: Otolaryngology

## 2012-01-06 DIAGNOSIS — F17200 Nicotine dependence, unspecified, uncomplicated: Secondary | ICD-10-CM | POA: Insufficient documentation

## 2012-01-06 DIAGNOSIS — L03213 Periorbital cellulitis: Secondary | ICD-10-CM

## 2012-01-06 DIAGNOSIS — Z9889 Other specified postprocedural states: Secondary | ICD-10-CM | POA: Insufficient documentation

## 2012-01-06 DIAGNOSIS — F319 Bipolar disorder, unspecified: Secondary | ICD-10-CM | POA: Insufficient documentation

## 2012-01-06 DIAGNOSIS — H05019 Cellulitis of unspecified orbit: Secondary | ICD-10-CM | POA: Diagnosis not present

## 2012-01-06 DIAGNOSIS — F411 Generalized anxiety disorder: Secondary | ICD-10-CM | POA: Insufficient documentation

## 2012-01-06 DIAGNOSIS — I10 Essential (primary) hypertension: Secondary | ICD-10-CM | POA: Insufficient documentation

## 2012-01-06 DIAGNOSIS — J329 Chronic sinusitis, unspecified: Principal | ICD-10-CM | POA: Diagnosis present

## 2012-01-06 DIAGNOSIS — F200 Paranoid schizophrenia: Secondary | ICD-10-CM | POA: Insufficient documentation

## 2012-01-06 DIAGNOSIS — H5789 Other specified disorders of eye and adnexa: Secondary | ICD-10-CM | POA: Diagnosis not present

## 2012-01-06 DIAGNOSIS — J0110 Acute frontal sinusitis, unspecified: Secondary | ICD-10-CM

## 2012-01-06 DIAGNOSIS — J018 Other acute sinusitis: Secondary | ICD-10-CM | POA: Diagnosis not present

## 2012-01-06 DIAGNOSIS — E785 Hyperlipidemia, unspecified: Secondary | ICD-10-CM | POA: Insufficient documentation

## 2012-01-06 DIAGNOSIS — H00039 Abscess of eyelid unspecified eye, unspecified eyelid: Secondary | ICD-10-CM | POA: Insufficient documentation

## 2012-01-06 DIAGNOSIS — L0201 Cutaneous abscess of face: Secondary | ICD-10-CM | POA: Diagnosis not present

## 2012-01-06 DIAGNOSIS — H5710 Ocular pain, unspecified eye: Secondary | ICD-10-CM | POA: Insufficient documentation

## 2012-01-06 DIAGNOSIS — R51 Headache: Secondary | ICD-10-CM | POA: Diagnosis not present

## 2012-01-06 DIAGNOSIS — K219 Gastro-esophageal reflux disease without esophagitis: Secondary | ICD-10-CM | POA: Insufficient documentation

## 2012-01-06 DIAGNOSIS — R220 Localized swelling, mass and lump, head: Secondary | ICD-10-CM | POA: Diagnosis not present

## 2012-01-06 LAB — DIFFERENTIAL
Basophils Absolute: 0 10*3/uL (ref 0.0–0.1)
Basophils Relative: 0 % (ref 0–1)
Eosinophils Absolute: 0.2 10*3/uL (ref 0.0–0.7)
Eosinophils Relative: 4 % (ref 0–5)
Lymphocytes Relative: 33 % (ref 12–46)
Lymphocytes Relative: 36 % (ref 12–46)
Lymphs Abs: 2.4 10*3/uL (ref 0.7–4.0)
Monocytes Absolute: 0.7 10*3/uL (ref 0.1–1.0)
Monocytes Absolute: 0.8 10*3/uL (ref 0.1–1.0)
Monocytes Relative: 11 % (ref 3–12)
Monocytes Relative: 11 % (ref 3–12)
Neutro Abs: 3.7 10*3/uL (ref 1.7–7.7)
Neutrophils Relative %: 53 % (ref 43–77)

## 2012-01-06 LAB — CBC
HCT: 37 % — ABNORMAL LOW (ref 39.0–52.0)
HCT: 37.3 % — ABNORMAL LOW (ref 39.0–52.0)
Hemoglobin: 13.1 g/dL (ref 13.0–17.0)
Hemoglobin: 13.2 g/dL (ref 13.0–17.0)
MCH: 29.9 pg (ref 26.0–34.0)
MCHC: 35.1 g/dL (ref 30.0–36.0)
MCV: 83.7 fL (ref 78.0–100.0)
Platelets: 231 10*3/uL (ref 150–400)
RBC: 4.42 MIL/uL (ref 4.22–5.81)
WBC: 6.9 10*3/uL (ref 4.0–10.5)

## 2012-01-06 LAB — BASIC METABOLIC PANEL
CO2: 23 mEq/L (ref 19–32)
Calcium: 9.7 mg/dL (ref 8.4–10.5)
Chloride: 98 mEq/L (ref 96–112)
Glucose, Bld: 109 mg/dL — ABNORMAL HIGH (ref 70–99)
Sodium: 132 mEq/L — ABNORMAL LOW (ref 135–145)

## 2012-01-06 MED ORDER — QUETIAPINE FUMARATE 200 MG PO TABS
200.0000 mg | ORAL_TABLET | Freq: Four times a day (QID) | ORAL | Status: DC
  Administered 2012-01-07 – 2012-01-08 (×6): 200 mg via ORAL
  Filled 2012-01-06 (×9): qty 1

## 2012-01-06 MED ORDER — IBUPROFEN 600 MG PO TABS
600.0000 mg | ORAL_TABLET | Freq: Four times a day (QID) | ORAL | Status: DC | PRN
  Administered 2012-01-07 (×2): 600 mg via ORAL
  Filled 2012-01-06: qty 1
  Filled 2012-01-06: qty 3

## 2012-01-06 MED ORDER — THERA M PLUS PO TABS: 1.0000 | ORAL_TABLET | Freq: Every day | ORAL | Status: DC

## 2012-01-06 MED ORDER — HYDROCODONE-ACETAMINOPHEN 5-325 MG PO TABS: 1.0000 | ORAL_TABLET | ORAL | Status: DC | PRN

## 2012-01-06 MED ORDER — SODIUM CHLORIDE 0.9 % IV SOLN
3.0000 g | Freq: Four times a day (QID) | INTRAVENOUS | Status: DC
  Administered 2012-01-07 – 2012-01-08 (×5): 3 g via INTRAVENOUS
  Filled 2012-01-06 (×9): qty 3

## 2012-01-06 MED ORDER — HYDROMORPHONE HCL PF 1 MG/ML IJ SOLN
1.0000 mg | Freq: Once | INTRAMUSCULAR | Status: AC
  Administered 2012-01-06: 1 mg via INTRAVENOUS
  Filled 2012-01-06: qty 1

## 2012-01-06 MED ORDER — HALOPERIDOL 5 MG PO TABS
10.0000 mg | ORAL_TABLET | Freq: Two times a day (BID) | ORAL | Status: DC
  Administered 2012-01-07 – 2012-01-08 (×3): 10 mg via ORAL
  Filled 2012-01-06 (×4): qty 2

## 2012-01-06 MED ORDER — PANTOPRAZOLE SODIUM 40 MG PO TBEC
40.0000 mg | DELAYED_RELEASE_TABLET | Freq: Every day | ORAL | Status: DC
  Administered 2012-01-07 – 2012-01-08 (×2): 40 mg via ORAL
  Filled 2012-01-06 (×2): qty 1

## 2012-01-06 MED ORDER — CLINDAMYCIN PHOSPHATE 600 MG/50ML IV SOLN
600.0000 mg | Freq: Once | INTRAVENOUS | Status: AC
  Administered 2012-01-06: 600 mg via INTRAVENOUS
  Filled 2012-01-06: qty 50

## 2012-01-06 MED ORDER — IBUPROFEN 600 MG PO TABS: 600.0000 mg | ORAL_TABLET | Freq: Four times a day (QID) | ORAL | Status: DC | PRN

## 2012-01-06 MED ORDER — DULOXETINE HCL 60 MG PO CPEP
60.0000 mg | ORAL_CAPSULE | Freq: Every day | ORAL | Status: DC
  Administered 2012-01-07 – 2012-01-08 (×2): 60 mg via ORAL
  Filled 2012-01-06 (×3): qty 1

## 2012-01-06 MED ORDER — ONDANSETRON HCL 4 MG/2ML IJ SOLN
4.0000 mg | Freq: Once | INTRAMUSCULAR | Status: AC
  Administered 2012-01-06: 4 mg via INTRAVENOUS
  Filled 2012-01-06: qty 2

## 2012-01-06 MED ORDER — KETOROLAC TROMETHAMINE 30 MG/ML IJ SOLN
30.0000 mg | Freq: Once | INTRAMUSCULAR | Status: AC
  Administered 2012-01-06: 30 mg via INTRAVENOUS
  Filled 2012-01-06: qty 1

## 2012-01-06 MED ORDER — METOPROLOL TARTRATE 25 MG PO TABS
50.0000 mg | ORAL_TABLET | Freq: Two times a day (BID) | ORAL | Status: DC
  Administered 2012-01-07 – 2012-01-08 (×3): 50 mg via ORAL
  Filled 2012-01-06 (×4): qty 2

## 2012-01-06 MED ORDER — CLINDAMYCIN HCL 300 MG PO CAPS: 300.0000 mg | ORAL_CAPSULE | Freq: Four times a day (QID) | ORAL | Status: DC

## 2012-01-06 MED ORDER — BENZTROPINE MESYLATE 0.5 MG PO TABS
0.5000 mg | ORAL_TABLET | Freq: Two times a day (BID) | ORAL | Status: DC
  Administered 2012-01-07 – 2012-01-08 (×3): 0.5 mg via ORAL
  Filled 2012-01-06 (×4): qty 1

## 2012-01-06 MED ORDER — DEXTROSE-NACL 5-0.45 % IV SOLN
INTRAVENOUS | Status: DC
  Administered 2012-01-07 (×2): via INTRAVENOUS

## 2012-01-06 MED ORDER — MORPHINE SULFATE 4 MG/ML IJ SOLN
4.0000 mg | Freq: Once | INTRAMUSCULAR | Status: AC
  Administered 2012-01-06: 4 mg via INTRAVENOUS
  Filled 2012-01-06: qty 1

## 2012-01-06 MED ORDER — NIACIN ER (ANTIHYPERLIPIDEMIC) 500 MG PO TBCR
500.0000 mg | EXTENDED_RELEASE_TABLET | Freq: Every day | ORAL | Status: DC
  Administered 2012-01-07: 500 mg via ORAL
  Filled 2012-01-06 (×2): qty 1

## 2012-01-06 MED ORDER — HYDROCODONE-ACETAMINOPHEN 5-325 MG PO TABS
1.0000 | ORAL_TABLET | ORAL | Status: DC | PRN
Start: 2012-01-06 — End: 2012-01-08
  Administered 2012-01-07 – 2012-01-08 (×3): 1 via ORAL
  Filled 2012-01-06 (×3): qty 1

## 2012-01-06 MED ORDER — TRAZODONE HCL 150 MG PO TABS
300.0000 mg | ORAL_TABLET | Freq: Every day | ORAL | Status: DC
  Administered 2012-01-07: 300 mg via ORAL
  Filled 2012-01-06 (×3): qty 2

## 2012-01-06 MED ORDER — IOHEXOL 300 MG/ML  SOLN
75.0000 mL | Freq: Once | INTRAMUSCULAR | Status: AC | PRN
  Administered 2012-01-06: 75 mL via INTRAVENOUS

## 2012-01-06 MED ORDER — GABAPENTIN 300 MG PO CAPS
300.0000 mg | ORAL_CAPSULE | Freq: Three times a day (TID) | ORAL | Status: DC
  Administered 2012-01-07 – 2012-01-08 (×5): 300 mg via ORAL
  Filled 2012-01-06 (×6): qty 1

## 2012-01-06 MED ORDER — MELOXICAM 15 MG PO TABS
15.0000 mg | ORAL_TABLET | Freq: Every day | ORAL | Status: DC
  Administered 2012-01-07 – 2012-01-08 (×2): 15 mg via ORAL
  Filled 2012-01-06 (×2): qty 1

## 2012-01-06 MED ORDER — HALOPERIDOL DECANOATE 100 MG/ML IM SOLN: 100.0000 mg | INTRAMUSCULAR | Status: DC

## 2012-01-06 MED ORDER — SODIUM CHLORIDE 0.9 % IV BOLUS (SEPSIS)
1000.0000 mL | Freq: Once | INTRAVENOUS | Status: AC
  Administered 2012-01-06: 1000 mL via INTRAVENOUS

## 2012-01-06 NOTE — ED Notes (Signed)
Lt eye pain since yesterday.  He was seen here earlier today and diagnosed with sinusitis.  He was called and told to return for antibiotics

## 2012-01-06 NOTE — H&P (Signed)
[name] is an 36 y.o. male.   Chief Complaint: Left eye swelling HPI: 2-3 day history of nasal congestion mainly on the left side. This morning his left eye started to swell. He was seen at the [facility] earlier today and then transferred to [facility]. He has no prior nasal or sinus history. He complains of severe pain around the left eye and visual loss in the eye.  Past Medical History  Diagnosis Date  . Paranoid schizophrenia   . Bipolar 1 disorder   . Anxiety   . Hypertension   . Hyperlipidemia   . GERD (gastroesophageal reflux disease)     Past Surgical History  Procedure Date  . Cholecystectomy   . Ankle surgey   . Appendectomy     History reviewed. No pertinent family history. Social History:  reports that he has been smoking Cigarettes.  He has been smoking about 1 pack per day. He does not have any smokeless tobacco history on file. He reports that he does not drink alcohol or use illicit drugs.  Allergies:  Allergies  Allergen Reactions  . Compazine   . Imitrex (Sumatriptan Base) Nausea And Vomiting  . Prochlorperazine Edisylate     REACTION: Rash  . Risperidone     REACTION: Tremors    Medications Prior to Admission  Medication Dose Route Frequency Provider Last Rate Last Dose  . clindamycin (CLEOCIN) IVPB 600 mg  600 mg Intravenous Once Jola Baptist Idol, PA   600 mg at 01/06/12 1542  . HYDROmorphone (DILAUDID) injection 1 mg  1 mg Intravenous Once Candis Musa, PA   1 mg at 01/06/12 1721  . iohexol (OMNIPAQUE) 300 MG/ML solution 75 mL  75 mL Intravenous Once PRN Medication Radiologist, MD   75 mL at 01/06/12 1614  . ketorolac (TORADOL) 30 MG/ML injection 30 mg  30 mg Intravenous Once Candis Musa, PA   30 mg at 01/06/12 1720  . morphine 4 MG/ML injection 4 mg  4 mg Intravenous Once Candis Musa, PA   4 mg at 01/06/12 1542  . ondansetron (ZOFRAN) injection 4 mg  4 mg Intravenous Once Candis Musa, PA   4 mg at 01/06/12 1542  . sodium chloride 0.9 % bolus  1,000 mL  1,000 mL Intravenous Once Candis Musa, PA   1,000 mL at 01/06/12 1532   Medications Prior to Admission  Medication Sig Dispense Refill  . benztropine (COGENTIN) 0.5 MG tablet Take 0.5 mg by mouth 2 (two) times daily.        . DULoxetine (CYMBALTA) 60 MG capsule Take 60 mg by mouth every morning.       . gabapentin (NEURONTIN) 300 MG capsule Take 300 mg by mouth 3 (three) times daily.      . haloperidol (HALDOL) 10 MG tablet Take 10 mg by mouth 2 (two) times daily.       . meloxicam (MOBIC) 15 MG tablet Take 15 mg by mouth daily.        . metoprolol (LOPRESSOR) 50 MG tablet Take 50 mg by mouth 2 (two) times daily.        . Multiple Vitamins-Minerals (MULTIVITAMINS THER. W/MINERALS) TABS Take 1 tablet by mouth daily.        . niacin (NIASPAN) 500 MG CR tablet Take 500 mg by mouth at bedtime.        Marland Kitchen omeprazole (PRILOSEC) 40 MG capsule Take 40 mg by mouth daily.        Marland Kitchen  Polyethyl Glycol-Propyl Glycol (SYSTANE OP) Apply 1 drop to eye 4 (four) times daily.        . QUEtiapine (SEROQUEL) 200 MG tablet Take 200 mg by mouth 4 (four) times daily.      . traZODone (DESYREL) 150 MG tablet Take 300 mg by mouth at bedtime.        . haloperidol decanoate (HALDOL DECANOATE) 100 MG/ML injection Inject 100 mg into the muscle every 28 (twenty-eight) days. Had injection on February 7th..        No results found for this or any previous visit (from the past 48 hour(s)). Ct Orbits W/cm  01/06/2012  *RADIOLOGY REPORT*  Clinical Data: Left periorbital pain and swelling.  CT ORBITS WITH CONTRAST  Technique:  Multidetector CT imaging of the orbits was performed following the bolus administration of intravenous contrast.  Contrast: 75mL OMNIPAQUE IOHEXOL 300 MG/ML IV SOLN  Comparison: 05/04/2011  Findings: There is superficial soft tissue swelling in the periorbital region on the left with extension up into the soft tissues of the forehead.  Findings are consistent with cellulitis. There is no nonenhancing  collection to suggest abscess.  Changes are all preseptal.  No post-septal inflammation is seen.  The globes, optic nerves, extraocular muscles and lacrimal glands are normal.  The left frontal sinus is opacified. Margins of the sinuses are slightly indistinct.  There could conceivably be osteomyelitis. There is mucosal thickening in the right frontal sinus.  The ethmoid air cells on the left are opacified.  The maxillary sinus on the left is opacified and there is mucoperiosteal thickening indicating chronic inflammation.  Limited visualization of the intracranial contents does not show any abnormality.  IMPRESSION: Superficial soft tissue swelling in the left periorbital region and and forehead region.  No evidence of frank abscess.  No post-septal inflammatory change.  Underlying paranasal sinusitis on the left.  This is probably the etiology of the inflammation.  Questionable irregularity of the bony margins of the left frontal sinus raise the possibility of osteomyelitis.  Original Report Authenticated By: Thomasenia Sales, M.D.    ROS: otherwise negative  Blood pressure 123/82, pulse 81, temperature 97.8 F (36.6 C), temperature source Oral, resp. rate 16, SpO2 97.00%.  PHYSICAL EXAM: Overall appearance:  Healthy appearing, in discomfort secondary to the eye pain. Head:  Normocephalic, atraumatic. Ears: External auditory canals are clear; tympanic membranes are intact and the middle ears are free of any effusion. Nose: External nose is healthy in appearance. Internal nasal exam reveals pus draining from the left middle meatus. There is swelling of the infindibular mucosa. Right side clear. Oral Cavity:  There are no mucosal lesions or masses identified. Mucous membranes very dry. Oral Pharynx/Hypopharynx/Larynx: no signs of any mucosal lesions or masses identified.  Eye: Left superior eye lid swelling. I am able to open the eye but with tremendous discomfort. Pupil is round and reactive. He  complains of significant visual loss.  Neck: No palpable neck masses.  Studies Reviewed: Orbital CT reviewed.    Assessment/Plan Orbital cellulitis secondary to sinusitis. Recommend Immediate initiation of IV antibiotics. Will consult ID and Ophthalmology. Will need dedicated CT of sinuses. May need surgical intervention. Will admit and watch closely.  Darrell Leonhardt 01/06/2012, 10:58 PM

## 2012-01-06 NOTE — ED Notes (Signed)
Labs drawn from IV site

## 2012-01-06 NOTE — ED Notes (Signed)
Pt d/c'd from [facility] ED, instructed to come to [facility] ED, "was to be admitted by hospitalists, given IV abx, Dr. Pollyann Kennedy to consult/see tonight or in the morning". Confirmed plan with Dr. Pollyann Kennedy, he spoke with EDP. Pt is not a DA.

## 2012-01-06 NOTE — ED Notes (Signed)
Spoke with Dr. Fredricka Bonine at [facility], sending MD familiar with pt, clarifies: to be screened and contact hospitalist, labs ordered.

## 2012-01-06 NOTE — ED Notes (Signed)
Pt is being seen by MD who requests pt be moved for STAT IV antibiotics.  IV started to facilitate this.  Moved to room 10 to avoid further wait per MD

## 2012-01-06 NOTE — ED Notes (Signed)
Lt periorbital swelling, pain, Tender to touch, sensitive to light. Lid is puffy.  No d/c.  No injury.

## 2012-01-06 NOTE — Discharge Instructions (Signed)
Sinusitis Sinuses are air pockets within the bones of your face. The growth of bacteria within a sinus leads to infection. The infection prevents the sinuses from draining. This infection is called sinusitis. SYMPTOMS  There will be different areas of pain depending on which sinuses have become infected.  The maxillary sinuses often produce pain beneath the eyes.   Frontal sinusitis may cause pain in the middle of the forehead and above the eyes.  Other problems (symptoms) include:  Toothaches.   Colored, pus-like (purulent) drainage from the nose.   Swelling, warmth, and tenderness over the sinus areas may be signs of infection.  TREATMENT  Sinusitis is most often determined by an exam.X-rays may be taken. If x-rays have been taken, make sure you obtain your results or find out how you are to obtain them. Your caregiver may give you medications (antibiotics). These are medications that will help kill the bacteria causing the infection. You may also be given a medication (decongestant) that helps to reduce sinus swelling.  HOME CARE INSTRUCTIONS   Only take over-the-counter or prescription medicines for pain, discomfort, or fever as directed by your caregiver.   Drink extra fluids. Fluids help thin the mucus so your sinuses can drain more easily.   Applying either moist heat or ice packs to the sinus areas may help relieve discomfort.   Use saline nasal sprays to help moisten your sinuses. The sprays can be found at your local drugstore.  SEEK IMMEDIATE MEDICAL CARE IF:  You have a fever.   You have increasing pain, severe headaches, or toothache.   You have nausea, vomiting, or drowsiness.   You develop unusual swelling around the face or trouble seeing.  MAKE SURE YOU:   Understand these instructions.   Will watch your condition.   Will get help right away if you are not doing well or get worse.  Document Released: 10/28/2005 Document Revised: 07/10/2011 Document Reviewed:  05/27/2007 [facility] Patient Information 2012 [place], [place].  Periorbital Cellulitis Periorbital cellulitis is a common infection that can affect the eyelid and the soft tissues that surround the eyeball. The infection may also affect the structures that produce and drain tears. It does not affect the eyeball itself. Natural tissue barriers usually prevent the spread of this infection to the eyeball and other deeper areas of the eye socket.  CAUSES  Bacterial infection.   Long-term (chronic) sinus infections.   An object (foreign body) stuck behind the eye.   An injury that goes through the eyelid tissues.   An injury that causes an infection, such as an insect sting.   Fracture of the bone around the eye.   Infections which have spread from the eyelid or other structures around the eye.   Bite wounds.   Inflammation or infection of the lining membranes of the brain (meningitis).   An infection in the blood (septicemia).   Dental infection (abscess).   Viral infection (this is rare).  SYMPTOMS Symptoms usually come on suddenly.  Pain in the eye.   Red, hot, and swollen eyelids and possibly cheeks. The swelling is sometimes bad enough that the eyelids cannot open. Some infections make the eyelids look purple.   Fever and feeling generally ill.   Pain when touching the area around the eye.  DIAGNOSIS  Periorbital cellulitis can be diagnosed from an eye exam. In severe cases, your caregiver might suggest:  Blood tests.   Imaging tests (such as a CT scan) to examine the sinuses and the  area around and behind the eyeball.  TREATMENT If your caregiver feels that you do not have any signs of serious infection, treatment may include:  Antibiotics.   Nasal decongestants to reduce swelling.   Referral to a dentist if it is suspected that the infection was caused by a prior tooth infection.   Examination every day to make sure the problem is improving.  HOME CARE  INSTRUCTIONS  Take your antibiotics as directed. Finish them even if you start to feel better.   Some pain is normal with this condition. Take pain medicine as directed by your caregiver. Only take pain medicines approved by your caregiver.   It is important to drink fluids. Drink enough water and fluids to keep your urine clear or pale yellow.   Do not smoke.   Rest and get plenty of sleep.   Mild or moderate fevers generally have no long-term effects and often do not require treatment.   If your caregiver has given you a follow-up appointment, it is very important to keep that appointment. Your caregiver will need to make sure that the infection is getting better. It is important to check that a more serious infection is not developing.  SEEK IMMEDIATE MEDICAL CARE IF:  Your eyelids become more painful, red, warm, or swollen.   You develop double vision or your vision becomes blurred or worsens in any way.   You have trouble moving your eyes.   The eye looks like it is popping out (proptosis).   You develop a severe headache, severe neck pain, or neck stiffness.   You develop repeated vomiting.   You have a fever or persistent symptoms for more than 72 hours.   You have a fever and your symptoms suddenly get worse.  MAKE SURE YOU:  Understand these instructions.   Will watch your condition.   Will get help right away if you are not doing well or get worse.  Document Released: 11/30/2010 Document Revised: 07/10/2011 Document Reviewed: 11/30/2010 [facility] Patient Information 2012 [place], [place].   You may use warm compresses on your eye and forehead area for comfort.  Take one more dose of clindamycin this evening.  Take ibuprofen for pain and swelling.  Hydrocodone can be used for additional pain relief,  But will make you drowsy - use caution with this.  You will need to get rechecked by Dr. Pollyann Kennedy as discussed this week.  Call for this appointment.

## 2012-01-06 NOTE — ED Notes (Signed)
Left eyelid swollen since yesterday, denies any drainage from eye, +pain to eye

## 2012-01-06 NOTE — ED Notes (Signed)
Alert, says he feels better, IV d/c.  Has a ride home

## 2012-01-06 NOTE — ED Provider Notes (Signed)
History     CSN: [id number]  Arrival date & time 01/06/12  1144   First MD Initiated Contact with Patient 01/06/12 1435      Chief Complaint  Patient presents with  . Facial Swelling    (Consider location/radiation/quality/duration/timing/severity/associated sxs/prior treatment) Patient is a 36 y.o. male presenting with eye pain. The history is provided by the patient.  Eye Pain This is a new problem. Episode onset: 2 days ago. The problem has been gradually worsening. Associated symptoms include nausea. Pertinent negatives include no abdominal pain, arthralgias, chest pain, congestion, fever, headaches, joint swelling, neck pain, numbness, rash, sore throat, swollen glands or weakness. Exacerbated by: worse with palpation and with movement of eyes.  Bright lights also makes pain worse. He has tried NSAIDs for the symptoms. The treatment provided no relief.    Past Medical History  Diagnosis Date  . Paranoid schizophrenia   . Bipolar 1 disorder   . Anxiety   . Hypertension   . Hyperlipidemia   . GERD (gastroesophageal reflux disease)     Past Surgical History  Procedure Date  . Cholecystectomy   . Ankle surgey   . Appendectomy     History reviewed. No pertinent family history.  History  Substance Use Topics  . Smoking status: Current Everyday Smoker -- 1.0 packs/day    Types: Cigarettes  . Smokeless tobacco: Not on file  . Alcohol Use: No      Review of Systems  Constitutional: Negative for fever.  HENT: Negative for congestion, sore throat and neck pain.   Eyes: Positive for photophobia and pain. Negative for visual disturbance.  Respiratory: Negative for chest tightness and shortness of breath.   Cardiovascular: Negative for chest pain.  Gastrointestinal: Positive for nausea. Negative for abdominal pain.  Genitourinary: Negative.   Musculoskeletal: Negative for joint swelling and arthralgias.  Skin: Negative.  Negative for rash and wound.  Neurological:  Negative for dizziness, weakness, light-headedness, numbness and headaches.  Hematological: Negative.   Psychiatric/Behavioral: Negative.     Allergies  Compazine; Imitrex; Prochlorperazine edisylate; and Risperidone  Home Medications   Current Outpatient Rx  Name Route Sig Dispense Refill  . BENZTROPINE MESYLATE 0.5 MG PO TABS Oral Take 0.5 mg by mouth 2 (two) times daily.      Marland Kitchen BUPRENORPHINE 10 MCG/HR TD PTWK Transdermal Place 1 patch onto the skin once a week. Remove and discard used patches     . CLINDAMYCIN HCL 300 MG PO CAPS Oral Take 1 capsule (300 mg total) by mouth every 6 (six) hours. 28 capsule 0  . DULOXETINE HCL 60 MG PO CPEP Oral Take 60 mg by mouth every morning.     Marland Kitchen GABAPENTIN 300 MG PO CAPS Oral Take 300 mg by mouth 3 (three) times daily.    Marland Kitchen HALOPERIDOL 10 MG PO TABS Oral Take 10 mg by mouth 2 (two) times daily.     Marland Kitchen HALOPERIDOL DECANOATE 100 MG/ML IM SOLN Intramuscular Inject 100 mg into the muscle every 28 (twenty-eight) days. Had injection on February 7th..    . HYDROCODONE-ACETAMINOPHEN 5-325 MG PO TABS Oral Take 1 tablet by mouth every 4 (four) hours as needed for pain. 15 tablet 0  . IBUPROFEN 600 MG PO TABS Oral Take 1 tablet (600 mg total) by mouth every 6 (six) hours as needed for pain. 30 tablet 0  . MELOXICAM 15 MG PO TABS Oral Take 15 mg by mouth daily.      Marland Kitchen METOPROLOL TARTRATE 50 MG PO  TABS Oral Take 50 mg by mouth 2 (two) times daily.      Carma Leaven M PLUS PO TABS Oral Take 1 tablet by mouth daily.      Marland Kitchen NIACIN ER (ANTIHYPERLIPIDEMIC) 500 MG PO TBCR Oral Take 500 mg by mouth at bedtime.      . OMEPRAZOLE 40 MG PO CPDR Oral Take 40 mg by mouth daily.      Frazier Butt OP Ophthalmic Apply 1 drop to eye 4 (four) times daily.      . QUETIAPINE FUMARATE 200 MG PO TABS Oral Take 200 mg by mouth 4 (four) times daily.    . TRAZODONE HCL 150 MG PO TABS Oral Take 300 mg by mouth at bedtime.        BP 109/62  Pulse 90  Temp(Src) 98.3 F (36.8 C) (Oral)  Resp  18  Ht 6\' 2"  (1.88 m)  Wt 320 lb (145.151 kg)  BMI 41.09 kg/m2  SpO2 97%  Physical Exam  Nursing note and vitals reviewed. Constitutional: He is oriented to person, place, and time. He appears well-developed and well-nourished.  HENT:  Head: Normocephalic and atraumatic.  Right Ear: Tympanic membrane and ear canal normal.  Left Ear: Tympanic membrane and ear canal normal.  Nose: Left sinus exhibits frontal sinus tenderness.  Mouth/Throat: Oropharynx is clear and moist.  Eyes: Conjunctivae are normal. Pupils are equal, round, and reactive to light. No foreign bodies found. Left eye exhibits no chemosis, no discharge and no hordeolum. No foreign body present in the left eye. Left conjunctiva is not injected. Left eye exhibits normal extraocular motion and no nystagmus.       Erythema and edema of left upper eyelid through brow line.    Neck: Normal range of motion.  Cardiovascular: Normal rate, regular rhythm, normal heart sounds and intact distal pulses.   Pulmonary/Chest: Effort normal and breath sounds normal. He has no wheezes.  Abdominal: Soft. Bowel sounds are normal. There is no tenderness.  Musculoskeletal: Normal range of motion.  Neurological: He is alert and oriented to person, place, and time.  Skin: Skin is warm and dry.  Psychiatric: He has a normal mood and affect.    ED Course  Procedures (including critical care time)  Labs Reviewed - No data to display Ct Orbits W/cm  01/06/2012  *RADIOLOGY REPORT*  Clinical Data: Left periorbital pain and swelling.  CT ORBITS WITH CONTRAST  Technique:  Multidetector CT imaging of the orbits was performed following the bolus administration of intravenous contrast.  Contrast: 75mL OMNIPAQUE IOHEXOL 300 MG/ML IV SOLN  Comparison: 05/04/2011  Findings: There is superficial soft tissue swelling in the periorbital region on the left with extension up into the soft tissues of the forehead.  Findings are consistent with cellulitis. There is  no nonenhancing collection to suggest abscess.  Changes are all preseptal.  No post-septal inflammation is seen.  The globes, optic nerves, extraocular muscles and lacrimal glands are normal.  The left frontal sinus is opacified. Margins of the sinuses are slightly indistinct.  There could conceivably be osteomyelitis. There is mucosal thickening in the right frontal sinus.  The ethmoid air cells on the left are opacified.  The maxillary sinus on the left is opacified and there is mucoperiosteal thickening indicating chronic inflammation.  Limited visualization of the intracranial contents does not show any abnormality.  IMPRESSION: Superficial soft tissue swelling in the left periorbital region and and forehead region.  No evidence of frank abscess.  No post-septal  inflammatory change.  Underlying paranasal sinusitis on the left.  This is probably the etiology of the inflammation.  Questionable irregularity of the bony margins of the left frontal sinus raise the possibility of osteomyelitis.  Original Report Authenticated By: Thomasenia Sales, M.D.     1. Sinusitis, acute frontal   2. Periorbital cellulitis     Clindamycin 600 mg IV given in ed.    MDM  Discussed case with Dr Fredricka Bonine - close f/u with ENT.  Discussed with patient and Belenda Cruise,  The caregiver at his group home.  Both understand need for close f/u with Dr. Pollyann Kennedy for recheck of this infection.  Also,  Pt already has routine appt scheduled with his pcp Dr. Juanetta Gosling in 2 days.  PO clindamycin.  Ibuprofen,  Hydrocodone.    Call from Dr. Pollyann Kennedy who happened to see pt's Ct scan and referral to him.  Recommends hospital admission for further IV antibiotics.  Will consult - has asked for the hospitalists to admit this pt.  6:22 PM Call placed to [facility] - the caregiver at [facility] where pt resides - explained recommendation and Dr. Lucky Rathke recommendation.  Will send Shaughn to [facility] ed for admission this evening.  6:22 PM Call to  Kyung [facility]acca - [facility] - aware of plan for admission for this patient to hospitalists (per Dr. Pollyann Kennedy request).  Dr. Pollyann Kennedy will see in consult tonight.       Candis Musa, PA 01/06/12 1823

## 2012-01-06 NOTE — ED Notes (Signed)
Return from ct, alert,  Says he is still hurting.

## 2012-01-07 ENCOUNTER — Emergency Department (HOSPITAL_COMMUNITY): Payer: Medicare Other

## 2012-01-07 ENCOUNTER — Encounter (HOSPITAL_COMMUNITY): Payer: Self-pay | Admitting: Radiology

## 2012-01-07 DIAGNOSIS — R221 Localized swelling, mass and lump, neck: Secondary | ICD-10-CM | POA: Diagnosis not present

## 2012-01-07 DIAGNOSIS — L0201 Cutaneous abscess of face: Secondary | ICD-10-CM | POA: Diagnosis not present

## 2012-01-07 DIAGNOSIS — J018 Other acute sinusitis: Secondary | ICD-10-CM | POA: Diagnosis not present

## 2012-01-07 DIAGNOSIS — H00039 Abscess of eyelid unspecified eye, unspecified eyelid: Secondary | ICD-10-CM | POA: Diagnosis not present

## 2012-01-07 DIAGNOSIS — F17200 Nicotine dependence, unspecified, uncomplicated: Secondary | ICD-10-CM | POA: Diagnosis present

## 2012-01-07 DIAGNOSIS — R51 Headache: Secondary | ICD-10-CM | POA: Diagnosis not present

## 2012-01-07 DIAGNOSIS — J329 Chronic sinusitis, unspecified: Secondary | ICD-10-CM | POA: Diagnosis not present

## 2012-01-07 DIAGNOSIS — H5789 Other specified disorders of eye and adnexa: Secondary | ICD-10-CM | POA: Diagnosis not present

## 2012-01-07 DIAGNOSIS — H05019 Cellulitis of unspecified orbit: Secondary | ICD-10-CM

## 2012-01-07 DIAGNOSIS — R220 Localized swelling, mass and lump, head: Secondary | ICD-10-CM | POA: Diagnosis not present

## 2012-01-07 DIAGNOSIS — F200 Paranoid schizophrenia: Secondary | ICD-10-CM | POA: Diagnosis present

## 2012-01-07 LAB — HIV ANTIBODY (ROUTINE TESTING W REFLEX): HIV: NONREACTIVE

## 2012-01-07 LAB — COMPREHENSIVE METABOLIC PANEL
BUN: 19 mg/dL (ref 6–23)
CO2: 22 mEq/L (ref 19–32)
Calcium: 9.5 mg/dL (ref 8.4–10.5)
Creatinine, Ser: 1.05 mg/dL (ref 0.50–1.35)
GFR calc Af Amer: >90 mL/min (ref >90)
GFR calc non Af Amer: >90 mL/min (ref >90)
Glucose, Bld: 113 mg/dL — ABNORMAL HIGH (ref 70–99)
Total Protein: 7.8 g/dL (ref 6.0–8.3)

## 2012-01-07 MED ORDER — PROMETHAZINE HCL 25 MG RE SUPP
25.0000 mg | Freq: Four times a day (QID) | RECTAL | Status: DC | PRN
  Administered 2012-01-07: 25 mg via RECTAL
  Filled 2012-01-07 (×2): qty 1

## 2012-01-07 MED ORDER — ALPRAZOLAM 0.5 MG PO TABS
1.0000 mg | ORAL_TABLET | Freq: Three times a day (TID) | ORAL | Status: DC | PRN
  Administered 2012-01-07 – 2012-01-08 (×4): 1 mg via ORAL
  Filled 2012-01-07: qty 2
  Filled 2012-01-07: qty 1
  Filled 2012-01-07: qty 2
  Filled 2012-01-07: qty 1
  Filled 2012-01-07: qty 2

## 2012-01-07 MED ORDER — OXYMETAZOLINE HCL 0.05 % NA SOLN
2.0000 | Freq: Four times a day (QID) | NASAL | Status: DC
  Administered 2012-01-07 – 2012-01-08 (×3): 2 via NASAL
  Filled 2012-01-07: qty 15

## 2012-01-07 MED ORDER — MORPHINE SULFATE 2 MG/ML IJ SOLN
2.0000 mg | INTRAMUSCULAR | Status: DC | PRN
  Administered 2012-01-07 (×3): 2 mg via INTRAVENOUS
  Filled 2012-01-07 (×3): qty 1

## 2012-01-07 MED ORDER — VANCOMYCIN HCL 1000 MG IV SOLR
1500.0000 mg | Freq: Two times a day (BID) | INTRAVENOUS | Status: DC
  Administered 2012-01-08: 1500 mg via INTRAVENOUS
  Filled 2012-01-07 (×3): qty 1500

## 2012-01-07 MED ORDER — MORPHINE SULFATE 4 MG/ML IJ SOLN
4.0000 mg | INTRAMUSCULAR | Status: DC | PRN
  Administered 2012-01-07 – 2012-01-08 (×5): 4 mg via INTRAVENOUS
  Filled 2012-01-07 (×4): qty 1

## 2012-01-07 MED ORDER — MORPHINE SULFATE 2 MG/ML IJ SOLN
INTRAMUSCULAR | Status: AC
  Administered 2012-01-07: 2 mg via INTRAVENOUS
  Filled 2012-01-07: qty 1

## 2012-01-07 MED ORDER — VANCOMYCIN HCL 1000 MG IV SOLR
2500.0000 mg | Freq: Once | INTRAVENOUS | Status: AC
  Administered 2012-01-07: 2500 mg via INTRAVENOUS
  Filled 2012-01-07: qty 2500

## 2012-01-07 MED ORDER — ADULT MULTIVITAMIN W/MINERALS CH
1.0000 | ORAL_TABLET | Freq: Every day | ORAL | Status: DC
  Administered 2012-01-07 – 2012-01-08 (×2): 1 via ORAL
  Filled 2012-01-07 (×2): qty 1

## 2012-01-07 MED ORDER — SODIUM CHLORIDE 0.9 % IV SOLN
3.0000 g | INTRAVENOUS | Status: AC
  Administered 2012-01-07: 3 g via INTRAVENOUS
  Filled 2012-01-07: qty 3

## 2012-01-07 MED ORDER — NICOTINE 21 MG/24HR TD PT24
21.0000 mg | MEDICATED_PATCH | Freq: Every day | TRANSDERMAL | Status: DC
  Administered 2012-01-07 – 2012-01-08 (×2): 21 mg via TRANSDERMAL
  Filled 2012-01-07 (×2): qty 1

## 2012-01-07 NOTE — ED Notes (Signed)
Spoke with patient and prior nurse called report and pt should be going upstairs

## 2012-01-07 NOTE — Consult Note (Signed)
CC: Eval for visual loss HPI: Patient has a 2-3 day history of congestion with the development of eyelid swelling earlier yesterday on the left. He says his vision is decreased due to his eyelid, but also with the eyelid up he says it is blurry. OD is normal. Denies darkness of vision. He complains of pain around and behind the left eye. Consultation was requested by Dr. Pollyann Kennedy. Patient says it has been some time since his last eye exam. He is not sure if his eyes normally see the same or not.   Past Medical History  Diagnosis Date  . Paranoid schizophrenia   . Bipolar 1 disorder   . Anxiety   . Hypertension   . Hyperlipidemia   . GERD (gastroesophageal reflux disease)    Past Surgical History  Procedure Date  . Cholecystectomy   . Ankle surgey   . Appendectomy    History   Social History  . Marital Status: Divorced    Spouse Name: N/A    Number of Children: N/A  . Years of Education: N/A   Occupational History  . Not on file.   Social History Main Topics  . Smoking status: Current Everyday Smoker -- 1.0 packs/day    Types: Cigarettes  . Smokeless tobacco: Not on file  . Alcohol Use: No  . Drug Use: No  . Sexually Active:    Other Topics Concern  . Not on file   Social History Narrative  . No narrative on file   Family history - Family history of glaucoma  Allergies  Allergen Reactions  . Compazine   . Imitrex (Sumatriptan Base) Nausea And Vomiting  . Prochlorperazine Edisylate     REACTION: Rash  . Risperidone     REACTION: Tremors       . ampicillin-sulbactam (UNASYN) IV  3 g Intravenous Q6H  . ampicillin-sulbactam (UNASYN) IV  3 g Intravenous To Major  . benztropine  0.5 mg Oral BID  . DULoxetine  60 mg Oral [id number]  . gabapentin  300 mg Oral TID  . haloperidol  10 mg Oral BID  . haloperidol decanoate  100 mg Intramuscular Q28 days  . meloxicam  15 mg Oral Daily  . metoprolol  50 mg Oral BID  . multivitamins ther. w/minerals  1 tablet Oral Daily  .  niacin  500 mg Oral QHS  . pantoprazole  40 mg Oral Q1200  . QUEtiapine  200 mg Oral QID  . traZODone  300 mg Oral QHS   ROS: 10 point review positive for nausea, pain near left eye. Otherwise negative.  EXAM: VA (at near without correction): OD 20/20 OS 20/70 (holding eyelids open, patient complaining of pain) CVF - Full OU Motility - Symmetric, grossly full. Orthotropic. Pupils - PERRL, no visible RAPD (holding eyelids open, patient closing eyes). IOP (mm Hg by tonopen) - OD 14, OS 14  Slit lamp exam: Lids - OD normal. OS moderate edema of upper lid with secondary ptosis.  Conj - OD wnl. OS slight injection.  Cornea - clear OU. AC - Deep and quiet OU Iris - Normal OU Lens - Clear OU.  Neosynephrine and tropicamide given OS at 12:45AM (patient defers dilation OD) Dilated fundus exam OS (90D and 20D lenses):  Vitreous is clear. Optic nerve is pink and sharp. Vessels are perfused. Macula and peripheral retina are normal.   CT orbits - Report describes pre-septal soft tissue swelling, no post-septal inflammation with normal globes, optic nerves and EOMs, lacrimal glands.  Sinus opacification present. No abscesses seen.   Imp/Plan:  Sinusitis with pre-septal cellulitis. Objective signs and examination of the globes and optic nerve head are essentially normal. Pupillary responses appear normal without any visible RAPD to suggest optic neuropathy. Eye pressure is normal. Measured visual acuity OS could be related to pain, partial obstruction of visual axis even with manually opening eyelids, refractive error or amblyopia. He may follow up as needed, or with his primary eye care provider. Findings discussed with Dr. Pollyann Kennedy.

## 2012-01-07 NOTE — ED Notes (Signed)
Opthamology seeing patient.

## 2012-01-07 NOTE — Progress Notes (Addendum)
Patient ID: [name], male   DOB: 1976/06/26, 36 y.o.   MRN: [id number] Subjective: Slightly better pain control  Objective: Vital signs in last 24 hours: Temp:  [97.1 F (36.2 C)-98.3 F (36.8 C)] 97.6 F (36.4 C) (02/26 0621) Pulse Rate:  [71-90] 71  (02/26 0621) Resp:  [16-18] 16  (02/26 0621) BP: (105-136)/(60-82) 105/60 mmHg (02/26 0621) SpO2:  [94 %-97 %] 94 % (02/26 0621) Weight:  [145.151 kg (320 lb)-150.6 kg (332 lb 0.2 oz)] 150.6 kg (332 lb 0.2 oz) (02/26 0347) Weight change:     Intake/Output from previous day: 02/25 0701 - 02/26 0700 In: 134 [I.V.:134] Out: 400 [Urine:400] Intake/Output this shift: Total I/O In: 134 [I.V.:134] Out: 400 [Urine:400]  PHYSICAL EXAM: Slightly less swelling, and tenderness. Eye opens a little better. Pupil reactive and round. No proptosis.  Lab Results:  Basename 01/06/12 2319 01/06/12 2250  WBC 6.9 6.6  HGB 13.1 13.2  HCT 37.3* 37.0*  PLT 232 231   BMET  Basename 01/06/12 2319 01/06/12 2250  NA 131* 132*  K 3.9 4.1  CL 97 98  CO2 22 23  GLUCOSE 113* 109*  BUN 19 18  CREATININE 1.05 1.09  CALCIUM 9.5 9.7    Studies/Results: Ct Maxillofacial Wo Cm  01/07/2012  *RADIOLOGY REPORT*  Clinical Data: Sinus drainage for 3 days; left-sided facial pain and swelling, especially about the left eye.  Left eye pain.  CT MAXILLOFACIAL WITHOUT CONTRAST  Technique:  Multidetector CT imaging of the maxillofacial structures was performed. Multiplanar CT image reconstructions were also generated.  Comparison: CT of the orbits performed 01/06/2012  Findings: Superficial soft tissue swelling in the left periorbital region is similar in appearance to the recent prior study, with minimal soft tissue swelling noted overlying the frontal calvarium. As before, this likely reflects cellulitis.  No focal fluid collections are identified.  The left orbit remains intact.  There is no evidence for postseptal cellulitis.  The left optic globe,  intraocular musculature and vascular structures are unremarkable.  The orbits are relatively symmetric.  As on the recent prior study, there is complete opacification of the left maxillary sinus, and partial opacification of the left ethmoid air cells.  There is opacification of the left frontal sinus and partial opacification of the right frontal sinus.  The remaining paranasal sinuses and mastoid air cells are well-aerated.  There is no evidence of fracture or dislocation.  The maxilla and mandible appear intact.  The nasal bone is unremarkable in appearance.  There is complete absence of the dentition.  No significant soft tissue abnormalities are seen.  The parapharyngeal fat planes are preserved.  The nasopharynx, oropharynx and hypopharynx are unremarkable in appearance.  The visualized portions of the valleculae and piriform sinuses are grossly unremarkable.  The parotid and submandibular glands are within normal limits.  No cervical lymphadenopathy is seen.  The visualized portions of the brain are unremarkable.  IMPRESSION:  1.  No significant change from the recent prior study.  No evidence for postseptal cellulitis.  Mild soft tissue swelling in the left periorbital region and overlying the frontal calvarium likely reflects mild superficial cellulitis.  No evidence for abscess. 2.  Stable complete opacification of the left maxillary sinus, partial opacification of the left ethmoid air cells, opacification of the left frontal sinus and partial opacification of the right frontal sinus.  Original Report Authenticated By: Tonia Ghent, M.D.   Ct Orbits W/cm  01/06/2012  *RADIOLOGY REPORT*  Clinical Data: Left periorbital  pain and swelling.  CT ORBITS WITH CONTRAST  Technique:  Multidetector CT imaging of the orbits was performed following the bolus administration of intravenous contrast.  Contrast: 75mL OMNIPAQUE IOHEXOL 300 MG/ML IV SOLN  Comparison: 05/04/2011  Findings: There is superficial soft tissue  swelling in the periorbital region on the left with extension up into the soft tissues of the forehead.  Findings are consistent with cellulitis. There is no nonenhancing collection to suggest abscess.  Changes are all preseptal.  No post-septal inflammation is seen.  The globes, optic nerves, extraocular muscles and lacrimal glands are normal.  The left frontal sinus is opacified. Margins of the sinuses are slightly indistinct.  There could conceivably be osteomyelitis. There is mucosal thickening in the right frontal sinus.  The ethmoid air cells on the left are opacified.  The maxillary sinus on the left is opacified and there is mucoperiosteal thickening indicating chronic inflammation.  Limited visualization of the intracranial contents does not show any abnormality.  IMPRESSION: Superficial soft tissue swelling in the left periorbital region and and forehead region.  No evidence of frank abscess.  No post-septal inflammatory change.  Underlying paranasal sinusitis on the left.  This is probably the etiology of the inflammation.  Questionable irregularity of the bony margins of the left frontal sinus raise the possibility of osteomyelitis.  Original Report Authenticated By: Thomasenia Sales, M.D.    Medications: I have reviewed the patient's current medications.  Assessment/Plan: Periorbital cellulitis. Slight improvement on Augmentin. Continue IV Abx. Continue to monitor the eye.  LOS: 1 day   [name] 01/07/2012, 6:30 AM  Patient was seen again on rounds today at about 12:30 PM. There was no significant change in his swelling or pain. Continue intravenous antibiotics. Continue to monitor the eye. According to the nurse's report just now, the swelling has gone down significantly.

## 2012-01-07 NOTE — ED Notes (Signed)
Pt to tx rm from triage. Pt c/o sinus drainage starting three days ago and yesterday he began to have left facial pain and swelling especially around eye area. Pt alert and oriented and in no acute distress.

## 2012-01-07 NOTE — Consult Note (Signed)
Date of Admission:  01/06/2012  Date of Consult:  01/07/2012  Reason for Consult: Peri-orbital Cellulitis Referring Physician: Pollyann Kennedy  Impression/Recommendation Peri-orbital Cellulitis Would- continue unasyn for now. If not improving change to zosyn he does not seem to have risk factors for pseudomonas. He denies exposure to anyone with skin infections. Being in a group home certainly raises his risk for MRSA, will start vanco.  Check his HIV test.   [name] is an 36 y.o. male.  HPI: 36 yo M with hx of Bipolar d/o, schizophrenia, lives in group home, with 2-3 days of nasal congestion. On 2-25 he noticed L eye swelling. He wen to [facility] then transferred to [facility]. His Maxillofacial CT showed pan-sinusitis, L peri-orbital cellulitis, ? Osteo L frontal sinus, no abscess. He had lost some visual accuity, was eval by optho and had normal exam.  He was started on Unasyn on transfer to [facility].    ROS- no headaches, no cough, no neck stiffness, no sinus drainage, no cough. No paresthesias. See HPI.    Past Medical History  Diagnosis Date  . Paranoid schizophrenia   . Bipolar 1 disorder   . Anxiety   . Hypertension   . Hyperlipidemia   . GERD (gastroesophageal reflux disease)     Past Surgical History  Procedure Date  . Cholecystectomy   . Ankle surgey   . Appendectomy   ergies:   Allergies  Allergen Reactions  . Compazine   . Imitrex (Sumatriptan Base) Nausea And Vomiting  . Prochlorperazine Edisylate     REACTION: Rash  . Risperidone     REACTION: Tremors    Medications:  Scheduled:   . ampicillin-sulbactam (UNASYN) IV  3 g Intravenous Q6H  . ampicillin-sulbactam (UNASYN) IV  3 g Intravenous To Major  . benztropine  0.5 mg Oral BID  . DULoxetine  60 mg Oral QAC breakfast  . gabapentin  300 mg Oral TID  . haloperidol  10 mg Oral BID  . haloperidol decanoate  100 mg Intramuscular Q28 days  . meloxicam  15 mg Oral Daily  . metoprolol  50 mg Oral BID  .  mulitivitamin with minerals  1 tablet Oral Daily  . niacin  500 mg Oral QHS  . oxymetazoline  2 spray Left Nare QID  . pantoprazole  40 mg Oral Q1200  . QUEtiapine  200 mg Oral QID  . traZODone  300 mg Oral QHS  . DISCONTD: multivitamins ther. w/minerals  1 tablet Oral Daily    Social History:  reports that he has been smoking Cigarettes.  He has been smoking about 1 pack per day. He has never used smokeless tobacco. He reports that he does not drink alcohol or use illicit drugs.  History reviewed. No pertinent family history.     Blood pressure 102/43, pulse 74, temperature 97.6 F (36.4 C), temperature source Oral, resp. rate 16, height 6\' 2"  (1.88 m), weight 150.6 kg (332 lb 0.2 oz), SpO2 98.00%. General appearance: alert, cooperative and no distress Eyes: R eye is EOMI, no photophobia, L eye is swollen lid, pt won't open. ? photophobia Throat: no teeth, dried blood in pharynx.  Neck: no adenopathy and FROM, non-tender Lungs: clear to auscultation bilaterally Heart: regular rate and rhythm Abdomen: normal findings: bowel sounds normal and soft, non-tender Extremities: no edema, scar on R ankle, scalling of thickened skin on soles of feet.    Results for orders placed during the hospital encounter of 01/06/12 (from the past 48 hour(s))  BASIC METABOLIC PANEL     Status: Abnormal   Collection Time   01/06/12 10:50 PM      Component Value Range Comment   Sodium 132 (*) 135 - 145 (mEq/L)    Potassium 4.1  3.5 - 5.1 (mEq/L)    Chloride 98  96 - 112 (mEq/L)    CO2 23  19 - 32 (mEq/L)    Glucose, Bld 109 (*) 70 - 99 (mg/dL)    BUN 18  6 - 23 (mg/dL)    Creatinine, Ser 2.13  0.50 - 1.35 (mg/dL)    Calcium 9.7  8.4 - 10.5 (mg/dL)    GFR calc non Af Amer 86 (*) >90 (mL/min)    GFR calc Af Amer >90  >90 (mL/min)   CBC     Status: Abnormal   Collection Time   01/06/12 10:50 PM      Component Value Range Comment   WBC 6.6  4.0 - 10.5 (K/uL)    RBC 4.42  4.22 - 5.81 (MIL/uL)     Hemoglobin 13.2  13.0 - 17.0 (g/dL)    HCT 08.6 (*) 57.8 - 52.0 (%)    MCV 83.7  78.0 - 100.0 (fL)    MCH 29.9  26.0 - 34.0 (pg)    MCHC 35.7  30.0 - 36.0 (g/dL)    RDW 46.9  62.9 - 52.8 (%)    Platelets 231  150 - 400 (K/uL)   DIFFERENTIAL     Status: Normal   Collection Time   01/06/12 10:50 PM      Component Value Range Comment   Neutrophils Relative 49  43 - 77 (%)    Neutro Abs 3.2  1.7 - 7.7 (K/uL)    Lymphocytes Relative 36  12 - 46 (%)    Lymphs Abs 2.4  0.7 - 4.0 (K/uL)    Monocytes Relative 11  3 - 12 (%)    Monocytes Absolute 0.7  0.1 - 1.0 (K/uL)    Eosinophils Relative 4  0 - 5 (%)    Eosinophils Absolute 0.2  0.0 - 0.7 (K/uL)    Basophils Relative 0  0 - 1 (%)    Basophils Absolute 0.0  0.0 - 0.1 (K/uL)   COMPREHENSIVE METABOLIC PANEL     Status: Abnormal   Collection Time   01/06/12 11:19 PM      Component Value Range Comment   Sodium 131 (*) 135 - 145 (mEq/L)    Potassium 3.9  3.5 - 5.1 (mEq/L)    Chloride 97  96 - 112 (mEq/L)    CO2 22  19 - 32 (mEq/L)    Glucose, Bld 113 (*) 70 - 99 (mg/dL)    BUN 19  6 - 23 (mg/dL)    Creatinine, Ser 4.13  0.50 - 1.35 (mg/dL)    Calcium 9.5  8.4 - 10.5 (mg/dL)    Total Protein 7.8  6.0 - 8.3 (g/dL)    Albumin 3.8  3.5 - 5.2 (g/dL)    AST 244 (*) 0 - 37 (U/L) HEMOLYSIS AT THIS LEVEL MAY AFFECT RESULT   ALT 90 (*) 0 - 53 (U/L)    Alkaline Phosphatase 125 (*) 39 - 117 (U/L)    Total Bilirubin 0.6  0.3 - 1.2 (mg/dL)    GFR calc non Af Amer >90  >90 (mL/min)    GFR calc Af Amer >90  >90 (mL/min)   CBC     Status: Abnormal   Collection Time  01/06/12 11:19 PM      Component Value Range Comment   WBC 6.9  4.0 - 10.5 (K/uL)    RBC 4.46  4.22 - 5.81 (MIL/uL)    Hemoglobin 13.1  13.0 - 17.0 (g/dL)    HCT 16.1 (*) 09.6 - 52.0 (%)    MCV 83.6  78.0 - 100.0 (fL)    MCH 29.4  26.0 - 34.0 (pg)    MCHC 35.1  30.0 - 36.0 (g/dL)    RDW 04.5  40.9 - 81.1 (%)    Platelets 232  150 - 400 (K/uL)   DIFFERENTIAL     Status: Normal    Collection Time   01/06/12 11:19 PM      Component Value Range Comment   Neutrophils Relative 53  43 - 77 (%)    Neutro Abs 3.7  1.7 - 7.7 (K/uL)    Lymphocytes Relative 33  12 - 46 (%)    Lymphs Abs 2.3  0.7 - 4.0 (K/uL)    Monocytes Relative 11  3 - 12 (%)    Monocytes Absolute 0.8  0.1 - 1.0 (K/uL)    Eosinophils Relative 3  0 - 5 (%)    Eosinophils Absolute 0.2  0.0 - 0.7 (K/uL)    Basophils Relative 0  0 - 1 (%)    Basophils Absolute 0.0  0.0 - 0.1 (K/uL)    No results found for this basename: sdes, specrequest, cult, reptstatus   Ct Maxillofacial Wo Cm  01/07/2012  *RADIOLOGY REPORT*  Clinical Data: Sinus drainage for 3 days; left-sided facial pain and swelling, especially about the left eye.  Left eye pain.  CT MAXILLOFACIAL WITHOUT CONTRAST  Technique:  Multidetector CT imaging of the maxillofacial structures was performed. Multiplanar CT image reconstructions were also generated.  Comparison: CT of the orbits performed 01/06/2012  Findings: Superficial soft tissue swelling in the left periorbital region is similar in appearance to the recent prior study, with minimal soft tissue swelling noted overlying the frontal calvarium. As before, this likely reflects cellulitis.  No focal fluid collections are identified.  The left orbit remains intact.  There is no evidence for postseptal cellulitis.  The left optic globe, intraocular musculature and vascular structures are unremarkable.  The orbits are relatively symmetric.  As on the recent prior study, there is complete opacification of the left maxillary sinus, and partial opacification of the left ethmoid air cells.  There is opacification of the left frontal sinus and partial opacification of the right frontal sinus.  The remaining paranasal sinuses and mastoid air cells are well-aerated.  There is no evidence of fracture or dislocation.  The maxilla and mandible appear intact.  The nasal bone is unremarkable in appearance.  There is complete  absence of the dentition.  No significant soft tissue abnormalities are seen.  The parapharyngeal fat planes are preserved.  The nasopharynx, oropharynx and hypopharynx are unremarkable in appearance.  The visualized portions of the valleculae and piriform sinuses are grossly unremarkable.  The parotid and submandibular glands are within normal limits.  No cervical lymphadenopathy is seen.  The visualized portions of the brain are unremarkable.  IMPRESSION:  1.  No significant change from the recent prior study.  No evidence for postseptal cellulitis.  Mild soft tissue swelling in the left periorbital region and overlying the frontal calvarium likely reflects mild superficial cellulitis.  No evidence for abscess. 2.  Stable complete opacification of the left maxillary sinus, partial opacification of the left ethmoid  air cells, opacification of the left frontal sinus and partial opacification of the right frontal sinus.  Original Report Authenticated By: Tonia Ghent, M.D.   Ct Orbits W/cm  01/06/2012  *RADIOLOGY REPORT*  Clinical Data: Left periorbital pain and swelling.  CT ORBITS WITH CONTRAST  Technique:  Multidetector CT imaging of the orbits was performed following the bolus administration of intravenous contrast.  Contrast: 75mL OMNIPAQUE IOHEXOL 300 MG/ML IV SOLN  Comparison: 05/04/2011  Findings: There is superficial soft tissue swelling in the periorbital region on the left with extension up into the soft tissues of the forehead.  Findings are consistent with cellulitis. There is no nonenhancing collection to suggest abscess.  Changes are all preseptal.  No post-septal inflammation is seen.  The globes, optic nerves, extraocular muscles and lacrimal glands are normal.  The left frontal sinus is opacified. Margins of the sinuses are slightly indistinct.  There could conceivably be osteomyelitis. There is mucosal thickening in the right frontal sinus.  The ethmoid air cells on the left are opacified.  The  maxillary sinus on the left is opacified and there is mucoperiosteal thickening indicating chronic inflammation.  Limited visualization of the intracranial contents does not show any abnormality.  IMPRESSION: Superficial soft tissue swelling in the left periorbital region and and forehead region.  No evidence of frank abscess.  No post-septal inflammatory change.  Underlying paranasal sinusitis on the left.  This is probably the etiology of the inflammation.  Questionable irregularity of the bony margins of the left frontal sinus raise the possibility of osteomyelitis.  Original Report Authenticated By: Thomasenia Sales, M.D.    Thank you so much for this interesting consult,   Johny Sax [phone] 01/07/2012, 2:05 PM

## 2012-01-07 NOTE — Clinical Documentation Improvement (Signed)
BMI DOCUMENTATION CLARIFICATION QUERY  THIS DOCUMENT IS NOT A PERMANENT PART OF THE MEDICAL RECORD  TO RESPOND TO THE THIS QUERY, FOLLOW THE INSTRUCTIONS BELOW:  1. If needed, update documentation for the patient's encounter via the notes activity.  2. Access this query again and click edit on the In [employer].  3. After updating, or not, click F2 to complete all highlighted (required) fields concerning your review. Select "additional documentation in the medical record" OR "no additional documentation provided".  4. Click Sign note button.  5. The deficiency will fall out of your In Basket *Please let us know if you are not able to complete this workflow by phone or e-mail (listed below).         01/07/12  Dear Dr. [facility]  In an effort to better capture your patient's severity of illness, reflect appropriate length of stay and utilization of resources, a review of the patient medical record has revealed the following indicators.    Based on your clinical judgment, please clarify and document in a progress note and/or discharge summary the clinical condition associated with the following supporting information:  In responding to this query please exercise your independent judgment.  The fact that a query is asked, does not imply that any particular answer is desired or expected.  BEST PRACTICE: When linking BMI to a diagnosis please document both BMI and diagnosis together in pn for accuracy of SOI and ROM.    Possible Clinical conditions  Morbid Obesity W/ BMI= 41.09  Other condition___________________  Cannot Clinically determine _____________   Weight: 150.6kg  Height 188cm  BMI= 41.09 per CHL   Reviewed: Query not answered   Thank You,   Rossie Muskrat RN, BSN Clinical Documentation Specialist Pager:  [phone]  [email or web address]  Health Information Management [place]

## 2012-01-07 NOTE — ED Provider Notes (Signed)
Evaluation and management procedures were performed by the PA/NP under my supervision/collaboration.   Felisa Bonier, MD 01/07/12 2035

## 2012-01-07 NOTE — Progress Notes (Addendum)
ANTIBIOTIC CONSULT NOTE - INITIAL  Pharmacy Consult for Vancomycin Indication: peri-orbital cellulitis of L eye  Allergies  Allergen Reactions  . Compazine   . Imitrex (Sumatriptan Base) Nausea And Vomiting  . Prochlorperazine Edisylate     REACTION: Rash  . Risperidone     REACTION: Tremors    Patient Measurements: Height: 6\' 2"  (188 cm) Weight: 332 lb 0.2 oz (150.6 kg) IBW/kg (Calculated) : 82.2  Adjusted Body Weight: 105 kg  Vital Signs: Temp: 97.6 F (36.4 C) (02/26 1357) Temp src: Oral (02/26 1357) BP: 102/43 mmHg (02/26 1357) Pulse Rate: 74  (02/26 1357) Intake/Output from previous day: 02/25 0701 - 02/26 0700 In: 134 [I.V.:134] Out: 400 [Urine:400] Intake/Output from this shift: Total I/O In: 100 [IV Piggyback:100] Out: 800 [Urine:800]  Labs:  [facility] 01/06/12 2319 01/06/12 2250  WBC 6.9 6.6  HGB 13.1 13.2  PLT 232 231  LABCREA -- --  CREATININE 1.05 1.09   Estimated Creatinine Clearance: 152.2 ml/min (by C-G formula based on Cr of 1.05). No results found for this basename: VANCOTROUGH:2,VANCOPEAK:2,VANCORANDOM:2,GENTTROUGH:2,GENTPEAK:2,GENTRANDOM:2,TOBRATROUGH:2,TOBRAPEAK:2,TOBRARND:2,AMIKACINPEAK:2,AMIKACINTROU:2,AMIKACIN:2, in the last 72 hours   Microbiology: No results found for this or any previous visit (from the past 720 hour(s)).  Medical History: Past Medical History  Diagnosis Date  . Paranoid schizophrenia   . Bipolar 1 disorder   . Anxiety   . Hypertension   . Hyperlipidemia   . GERD (gastroesophageal reflux disease)     Medications:  Scheduled:    . ampicillin-sulbactam (UNASYN) IV  3 g Intravenous Q6H  . ampicillin-sulbactam (UNASYN) IV  3 g Intravenous To Major  . benztropine  0.5 mg Oral BID  . DULoxetine  60 mg Oral QAC breakfast  . gabapentin  300 mg Oral TID  . haloperidol  10 mg Oral BID  . haloperidol decanoate  100 mg Intramuscular Q28 days  . meloxicam  15 mg Oral Daily  . metoprolol  50 mg Oral BID  .  mulitivitamin with minerals  1 tablet Oral Daily  . niacin  500 mg Oral QHS  . oxymetazoline  2 spray Left Nare QID  . pantoprazole  40 mg Oral Q1200  . QUEtiapine  200 mg Oral QID  . traZODone  300 mg Oral QHS  . DISCONTD: multivitamins ther. w/minerals  1 tablet Oral Daily   Infusions:    . dextrose 5 % and 0.45% NaCl 75 mL/hr at 01/07/12 0421   Anti-infectives     Start     Dose/Rate Route Frequency Ordered Stop   01/07/12 0600   Ampicillin-Sulbactam (UNASYN) 3 g in sodium chloride 0.9 % 100 mL IVPB        3 g 100 mL/hr over 60 Minutes Intravenous Every 6 hours 01/06/12 2320     01/07/12 0015   Ampicillin-Sulbactam (UNASYN) 3 g in sodium chloride 0.9 % 100 mL IVPB        3 g 100 mL/hr over 60 Minutes Intravenous To [facility] 01/07/12 0009 01/07/12 0115         Assessment: 36 yo M w/ hx of bipolar d/o/schizophrenia presents w/ 2-3 days nasal congestion. S/P 1 day of L eye swelling. Transferred from [facility]. On Unasyn for peri-orbital cellulitis, now to add vancomycin for MRSA concern (pt lives in group home). WBC is normal and pt is afebrile. Pt has good urine output (crcl >100 ml/min).   Goal of Therapy:  Vancomycin trough level 10-15 mcg/ml  Plan:  1) Vancomycin 2500 mg IV load x 1 now  2) Start vancomycin 1500 mg IV q12 h 3) F/U renal fx, L eye swelling, WBC, and LOT 4) check trough at steady state, as indicated  Janace Litten, PharmD 01/07/2012,2:26 PM

## 2012-01-08 DIAGNOSIS — L03211 Cellulitis of face: Secondary | ICD-10-CM | POA: Diagnosis not present

## 2012-01-08 DIAGNOSIS — J018 Other acute sinusitis: Secondary | ICD-10-CM | POA: Diagnosis not present

## 2012-01-08 MED ORDER — CLINDAMYCIN HCL 300 MG PO CAPS: 300.0000 mg | ORAL_CAPSULE | Freq: Three times a day (TID) | ORAL | Status: AC

## 2012-01-08 NOTE — Progress Notes (Signed)
Discharge instructions reviewed with patient and caregiver from [facility]. Printed AVS and FL2 and D/C summary given to caregiver. Questions answered. Pt discharge to facility.

## 2012-01-08 NOTE — Discharge Summary (Signed)
Physician Discharge Summary  Patient ID: [name] MRN: [id number] DOB/AGE: [birth year] 35 y.o.  Admit date: 01/06/2012 Discharge date: 01/08/2012  Admission Diagnoses: Periorbital cellulitis with frontal sinusitis  Discharge Diagnoses:  Active Problems:  * No active hospital problems. *    Discharged Condition: good  Hospital Course: He received intravenous antibiotics and recovered nicely, with significant decrease in eye swelling.  Consults: ID, ophthalmology  Significant Diagnostic Studies: radiology: CT scan: Left side pansinusitis with her orbital/preseptal cellulitis  Treatments: antibiotics: vancomycin and Unasyn  Discharge Exam: Blood pressure 102/70, pulse 56, temperature 97.3 F (36.3 C), temperature source Oral, resp. rate 16, height 6\' 2"  (1.88 m), weight 150.6 kg (332 lb 0.2 oz), SpO2 95.00%. PHYSICAL EXAM: Left eye swelling near completely resolved.  Disposition: 01-Home or Self Care  Discharge Orders    Future Orders Please Complete By Expires   Diet - low sodium heart healthy      Increase activity slowly        Medication List  As of 01/08/2012  8:15 AM   TAKE these medications         benztropine 0.5 MG tablet   Commonly known as: COGENTIN   Take 0.5 mg by mouth 2 (two) times daily.      clindamycin 300 MG capsule   Commonly known as: CLEOCIN   Take 1 capsule (300 mg total) by mouth 3 (three) times daily.      DULoxetine 60 MG capsule   Commonly known as: CYMBALTA   Take 60 mg by mouth every morning.      gabapentin 300 MG capsule   Commonly known as: NEURONTIN   Take 300 mg by mouth 3 (three) times daily.      haloperidol 10 MG tablet   Commonly known as: HALDOL   Take 10 mg by mouth 2 (two) times daily.      haloperidol decanoate 100 MG/ML injection   Commonly known as: HALDOL DECANOATE   Inject 100 mg into the muscle every 28 (twenty-eight) days. Had injection on February 7th.Marland Kitchen      HYDROcodone-acetaminophen 5-325 MG per  tablet   Commonly known as: NORCO   Take 1 tablet by mouth every 4 (four) hours as needed. For pain      ibuprofen 600 MG tablet   Commonly known as: ADVIL,MOTRIN   Take 1 tablet (600 mg total) by mouth every 6 (six) hours as needed for pain.      meloxicam 15 MG tablet   Commonly known as: MOBIC   Take 15 mg by mouth daily.      metoprolol 50 MG tablet   Commonly known as: LOPRESSOR   Take 50 mg by mouth 2 (two) times daily.      multivitamins ther. w/minerals Tabs   Take 1 tablet by mouth daily.      niacin 500 MG CR tablet   Commonly known as: NIASPAN   Take 500 mg by mouth at bedtime.      omeprazole 40 MG capsule   Commonly known as: PRILOSEC   Take 40 mg by mouth daily.      QUEtiapine 200 MG tablet   Commonly known as: SEROQUEL   Take 200 mg by mouth 4 (four) times daily.      SYSTANE OP   Apply 1 drop to eye 4 (four) times daily.      traZODone 150 MG tablet   Commonly known as: DESYREL   Take 300 mg by mouth at bedtime.  Follow-up Information    Follow up with Serena Colonel, MD. Call in 2 days.   Contact information:   [address], [address] [place] [address] [phone]          Signed: Serena Colonel 01/08/2012, 8:15 AM

## 2012-01-08 NOTE — Progress Notes (Signed)
Patient ID: [name], male   DOB: [birth year], 36 y.o.   MRN: [id number] Subjective: Feeling much better, less pain and much less swelling  Objective: Vital signs in last 24 hours: Temp:  [97.3 F (36.3 C)-97.7 F (36.5 C)] 97.3 F (36.3 C) (02/27 0527) Pulse Rate:  [56-74] 56  (02/27 0527) Resp:  [16-18] 16  (02/27 0527) BP: (94-136)/(43-70) 102/70 mmHg (02/27 0545) SpO2:  [95 %-98 %] 95 % (02/27 0527) Weight change:  Last BM Date: 01/05/12  Intake/Output from previous day: 02/26 0701 - 02/27 0700 In: 3592.3 [P.O.:120; I.V.:2172.3; IV Piggyback:1300] Out: 2450 [Urine:2450] Intake/Output this shift:    PHYSICAL EXAM: Is able to open the eye almost completely. There still a little bit of tenderness in the frontal area but much less. Vision appears to be intact.  Lab Results:  Basename 01/06/12 2319 01/06/12 2250  WBC 6.9 6.6  HGB 13.1 13.2  HCT 37.3* 37.0*  PLT 232 231   BMET  Basename 01/06/12 2319 01/06/12 2250  NA 131* 132*  K 3.9 4.1  CL 97 98  CO2 22 23  GLUCOSE 113* 109*  BUN 19 18  CREATININE 1.05 1.09  CALCIUM 9.5 9.7    Studies/Results: Ct Maxillofacial Wo Cm  01/07/2012  *RADIOLOGY REPORT*  Clinical Data: Sinus drainage for 3 days; left-sided facial pain and swelling, especially about the left eye.  Left eye pain.  CT MAXILLOFACIAL WITHOUT CONTRAST  Technique:  Multidetector CT imaging of the maxillofacial structures was performed. Multiplanar CT image reconstructions were also generated.  Comparison: CT of the orbits performed 01/06/2012  Findings: Superficial soft tissue swelling in the left periorbital region is similar in appearance to the recent prior study, with minimal soft tissue swelling noted overlying the frontal calvarium. As before, this likely reflects cellulitis.  No focal fluid collections are identified.  The left orbit remains intact.  There is no evidence for postseptal cellulitis.  The left optic globe, intraocular musculature and  vascular structures are unremarkable.  The orbits are relatively symmetric.  As on the recent prior study, there is complete opacification of the left maxillary sinus, and partial opacification of the left ethmoid air cells.  There is opacification of the left frontal sinus and partial opacification of the right frontal sinus.  The remaining paranasal sinuses and mastoid air cells are well-aerated.  There is no evidence of fracture or dislocation.  The maxilla and mandible appear intact.  The nasal bone is unremarkable in appearance.  There is complete absence of the dentition.  No significant soft tissue abnormalities are seen.  The parapharyngeal fat planes are preserved.  The nasopharynx, oropharynx and hypopharynx are unremarkable in appearance.  The visualized portions of the valleculae and piriform sinuses are grossly unremarkable.  The parotid and submandibular glands are within normal limits.  No cervical lymphadenopathy is seen.  The visualized portions of the brain are unremarkable.  IMPRESSION:  1.  No significant change from the recent prior study.  No evidence for postseptal cellulitis.  Mild soft tissue swelling in the left periorbital region and overlying the frontal calvarium likely reflects mild superficial cellulitis.  No evidence for abscess. 2.  Stable complete opacification of the left maxillary sinus, partial opacification of the left ethmoid air cells, opacification of the left frontal sinus and partial opacification of the right frontal sinus.  Original Report Authenticated By: Tonia Ghent, M.D.   Ct Orbits W/cm  01/06/2012  *RADIOLOGY REPORT*  Clinical Data: Left periorbital pain and swelling.  CT ORBITS WITH CONTRAST  Technique:  Multidetector CT imaging of the orbits was performed following the bolus administration of intravenous contrast.  Contrast: 75mL OMNIPAQUE IOHEXOL 300 MG/ML IV SOLN  Comparison: 05/04/2011  Findings: There is superficial soft tissue swelling in the periorbital  region on the left with extension up into the soft tissues of the forehead.  Findings are consistent with cellulitis. There is no nonenhancing collection to suggest abscess.  Changes are all preseptal.  No post-septal inflammation is seen.  The globes, optic nerves, extraocular muscles and lacrimal glands are normal.  The left frontal sinus is opacified. Margins of the sinuses are slightly indistinct.  There could conceivably be osteomyelitis. There is mucosal thickening in the right frontal sinus.  The ethmoid air cells on the left are opacified.  The maxillary sinus on the left is opacified and there is mucoperiosteal thickening indicating chronic inflammation.  Limited visualization of the intracranial contents does not show any abnormality.  IMPRESSION: Superficial soft tissue swelling in the left periorbital region and and forehead region.  No evidence of frank abscess.  No post-septal inflammatory change.  Underlying paranasal sinusitis on the left.  This is probably the etiology of the inflammation.  Questionable irregularity of the bony margins of the left frontal sinus raise the possibility of osteomyelitis.  Original Report Authenticated By: Thomasenia Sales, M.D.    Medications: I have reviewed the patient's current medications.  Assessment/Plan: Periorbital sialitis, sinusitis. Clinically improving nicely on intravenous antibiotics. Recommend we complete a couple more doses today of the IV antibiotics and then will switch to oral and discharge home. We'll have him followup with me next week.  LOS: 2 days   Deisy Ozbun 01/08/2012, 8:09 AM

## 2012-01-09 NOTE — Progress Notes (Signed)
LATE ENTRY FOR 01/08/12: Patient medically ready for discharge back to [facility]. Contact made with administrator Neysa Bonito [place] [phone]) and discharge information faxed to her for review. Discrepancies in patient's meds were noted and resolved with assistance from the nurse, pharmacy and MD. Patient was advised of the issue and expressed appreciation for CSW assistance in this matter.  Patient transported back to [facility] by administrator.  Genelle Bal, MSW, LCSW [phone]

## 2012-01-11 ENCOUNTER — Emergency Department (HOSPITAL_COMMUNITY): Payer: Medicare Other

## 2012-01-11 ENCOUNTER — Encounter (HOSPITAL_COMMUNITY): Payer: Self-pay | Admitting: *Deleted

## 2012-01-11 ENCOUNTER — Emergency Department (HOSPITAL_COMMUNITY)
Admission: EM | Admit: 2012-01-11 | Discharge: 2012-01-11 | Disposition: A | Discharge status: Home / Self Care | Payer: Medicare Other | Attending: Otolaryngology | Admitting: Otolaryngology

## 2012-01-11 DIAGNOSIS — J329 Chronic sinusitis, unspecified: Secondary | ICD-10-CM | POA: Insufficient documentation

## 2012-01-11 DIAGNOSIS — R220 Localized swelling, mass and lump, head: Secondary | ICD-10-CM | POA: Insufficient documentation

## 2012-01-11 DIAGNOSIS — R221 Localized swelling, mass and lump, neck: Secondary | ICD-10-CM | POA: Insufficient documentation

## 2012-01-11 DIAGNOSIS — J328 Other chronic sinusitis: Secondary | ICD-10-CM | POA: Diagnosis not present

## 2012-01-11 DIAGNOSIS — H05019 Cellulitis of unspecified orbit: Secondary | ICD-10-CM | POA: Diagnosis not present

## 2012-01-11 LAB — CBC
HCT: 36.9 % — ABNORMAL LOW (ref 39.0–52.0)
Hemoglobin: 12.9 g/dL — ABNORMAL LOW (ref 13.0–17.0)
MCHC: 35 g/dL (ref 30.0–36.0)
MCV: 83.5 fL (ref 78.0–100.0)
Platelets: 268 10*3/uL (ref 150–400)
RBC: 4.42 MIL/uL (ref 4.22–5.81)
RDW: 13.8 % (ref 11.5–15.5)
WBC: 8.3 10*3/uL (ref 4.0–10.5)

## 2012-01-11 LAB — BASIC METABOLIC PANEL
Calcium: 9.7 mg/dL (ref 8.4–10.5)
GFR calc Af Amer: >90 mL/min (ref >90)
GFR calc non Af Amer: >90 mL/min (ref >90)
Potassium: 3.7 mEq/L (ref 3.5–5.1)
Sodium: 136 mEq/L (ref 135–145)

## 2012-01-11 LAB — DIFFERENTIAL
Basophils Relative: 1 % (ref 0–1)
Eosinophils Absolute: 0.4 10*3/uL (ref 0.0–0.7)
Neutrophils Relative %: 44 % (ref 43–77)

## 2012-01-11 MED ORDER — HYDROCODONE-ACETAMINOPHEN 5-325 MG PO TABS: 1.0000 | ORAL_TABLET | ORAL | Status: AC | PRN

## 2012-01-11 MED ORDER — IOHEXOL 300 MG/ML  SOLN
100.0000 mL | Freq: Once | INTRAMUSCULAR | Status: AC | PRN
  Administered 2012-01-11: 100 mL via INTRAVENOUS

## 2012-01-11 NOTE — Discharge Instructions (Signed)
Please follow up with Dr. Pollyann Kennedy as he has instructed. Return to the ER if your condition worsens or with any other worrisome symptoms.  Sinusitis Sinuses are air pockets within the bones of your face. The growth of bacteria within a sinus leads to infection. The infection prevents the sinuses from draining. This infection is called sinusitis. SYMPTOMS  There will be different areas of pain depending on which sinuses have become infected.  The maxillary sinuses often produce pain beneath the eyes.   Frontal sinusitis may cause pain in the middle of the forehead and above the eyes.  Other problems (symptoms) include:  Toothaches.   Colored, pus-like (purulent) drainage from the nose.   Swelling, warmth, and tenderness over the sinus areas may be signs of infection.  TREATMENT  Sinusitis is most often determined by an exam.X-rays may be taken. If x-rays have been taken, make sure you obtain your results or find out how you are to obtain them. Your caregiver may give you medications (antibiotics). These are medications that will help kill the bacteria causing the infection. You may also be given a medication (decongestant) that helps to reduce sinus swelling.  HOME CARE INSTRUCTIONS   Only take over-the-counter or prescription medicines for pain, discomfort, or fever as directed by your caregiver.   Drink extra fluids. Fluids help thin the mucus so your sinuses can drain more easily.   Applying either moist heat or ice packs to the sinus areas may help relieve discomfort.   Use saline nasal sprays to help moisten your sinuses. The sprays can be found at your local drugstore.  SEEK IMMEDIATE MEDICAL CARE IF:  You have a fever.   You have increasing pain, severe headaches, or toothache.   You have nausea, vomiting, or drowsiness.   You develop unusual swelling around the face or trouble seeing.  MAKE SURE YOU:   Understand these instructions.   Will watch your condition.   Will  get help right away if you are not doing well or get worse.  Document Released: 10/28/2005 Document Revised: 07/10/2011 Document Reviewed: 05/27/2007 [facility] Patient Information 2012 [place], [place].

## 2012-01-11 NOTE — ED Notes (Signed)
Pt ambulated with a steady gait; VSS; A&Ox3; no signs of distress; no questions at this time; respirations even and unlabored; skin warm and dry.  

## 2012-01-11 NOTE — ED Notes (Signed)
Orders from Dr Pollyann Kennedy, at [facility] doing procedure, will be to ED after that

## 2012-01-11 NOTE — ED Notes (Signed)
PT resting comfortably. He reports he was just discharged Thursday and reports that he has left sided facial swelling again that brought him back to hospital today.

## 2012-01-11 NOTE — ED Notes (Signed)
Pt reports being admitted to hospital recently for facial swelling, sinusitis, cellulitis and was dc on Thursday, pain and swelilgn has returned, told to come to ed for eval. Airway intact, no obv swelling noted at triage.

## 2012-01-11 NOTE — Consult Note (Signed)
  Patient's caretaker had called me earlier this evening to tell me that he was getting worse again with his swelling. He arrived in the emergency department. I had him undergo blood count and CT scan imaging. White blood cell count is normal.  I have reviewed the CT imaging with radiology and there has been interval improvement of the sinus disease, complete resolution of the orbital cellulitis, and no other abnormal findings. On exam, his eye looks healthy and clear. There is no periorbital swelling. He was complaining about 4 had swelling but there is no identifiable forehead swelling. I have explained to him that he is doing much better and should continue with his clindamycin and followup with me next week.

## 2012-01-19 ENCOUNTER — Encounter (HOSPITAL_COMMUNITY): Payer: Self-pay | Admitting: Emergency Medicine

## 2012-01-19 ENCOUNTER — Emergency Department (HOSPITAL_COMMUNITY)
Admission: EM | Admit: 2012-01-19 | Discharge: 2012-01-19 | Disposition: A | Discharge status: Home / Self Care | Payer: Medicare Other | Attending: Emergency Medicine | Admitting: Emergency Medicine

## 2012-01-19 DIAGNOSIS — R51 Headache: Secondary | ICD-10-CM | POA: Insufficient documentation

## 2012-01-19 DIAGNOSIS — R110 Nausea: Secondary | ICD-10-CM | POA: Insufficient documentation

## 2012-01-19 DIAGNOSIS — R197 Diarrhea, unspecified: Secondary | ICD-10-CM | POA: Diagnosis not present

## 2012-01-19 DIAGNOSIS — R112 Nausea with vomiting, unspecified: Secondary | ICD-10-CM | POA: Diagnosis not present

## 2012-01-19 DIAGNOSIS — I10 Essential (primary) hypertension: Secondary | ICD-10-CM | POA: Insufficient documentation

## 2012-01-19 DIAGNOSIS — J0100 Acute maxillary sinusitis, unspecified: Secondary | ICD-10-CM | POA: Diagnosis not present

## 2012-01-19 DIAGNOSIS — J329 Chronic sinusitis, unspecified: Secondary | ICD-10-CM | POA: Diagnosis not present

## 2012-01-19 DIAGNOSIS — Z79899 Other long term (current) drug therapy: Secondary | ICD-10-CM | POA: Diagnosis not present

## 2012-01-19 LAB — DIFFERENTIAL
Eosinophils Absolute: 0.4 10*3/uL (ref 0.0–0.7)
Eosinophils Relative: 5 % (ref 0–5)
Lymphs Abs: 2.6 10*3/uL (ref 0.7–4.0)
Monocytes Absolute: 0.7 10*3/uL (ref 0.1–1.0)
Monocytes Relative: 10 % (ref 3–12)

## 2012-01-19 LAB — BASIC METABOLIC PANEL
BUN: 10 mg/dL (ref 6–23)
Calcium: 9.7 mg/dL (ref 8.4–10.5)
Creatinine, Ser: 0.89 mg/dL (ref 0.50–1.35)
GFR calc non Af Amer: >90 mL/min (ref >90)
Glucose, Bld: 101 mg/dL — ABNORMAL HIGH (ref 70–99)

## 2012-01-19 LAB — CBC
Hemoglobin: 12 g/dL — ABNORMAL LOW (ref 13.0–17.0)
MCH: 28.9 pg (ref 26.0–34.0)
MCV: 87.2 fL (ref 78.0–100.0)
RBC: 4.15 MIL/uL — ABNORMAL LOW (ref 4.22–5.81)

## 2012-01-19 MED ORDER — ONDANSETRON HCL 4 MG PO TABS: 4.0000 mg | ORAL_TABLET | Freq: Four times a day (QID) | ORAL | Status: AC | PRN

## 2012-01-19 MED ORDER — ONDANSETRON HCL 4 MG/2ML IJ SOLN
4.0000 mg | Freq: Once | INTRAMUSCULAR | Status: AC
  Administered 2012-01-19: 4 mg via INTRAVENOUS
  Filled 2012-01-19: qty 2

## 2012-01-19 MED ORDER — SODIUM CHLORIDE 0.9 % IV SOLN
INTRAVENOUS | Status: DC
  Administered 2012-01-19: 21:00:00 via INTRAVENOUS

## 2012-01-19 MED ORDER — HYDROMORPHONE HCL PF 1 MG/ML IJ SOLN
1.0000 mg | Freq: Once | INTRAMUSCULAR | Status: AC
  Administered 2012-01-19: 1 mg via INTRAVENOUS
  Filled 2012-01-19: qty 1

## 2012-01-19 MED ORDER — HYDROCODONE-ACETAMINOPHEN 5-325 MG PO TABS: 1.0000 | ORAL_TABLET | ORAL | Status: DC | PRN

## 2012-01-19 NOTE — ED Provider Notes (Signed)
History     CSN: [id number]  Arrival date & time 01/19/12  4098   First MD Initiated Contact with Patient 01/19/12 2104      Chief Complaint  Patient presents with  . Nausea  . Emesis  . Sinusitis    (Consider location/radiation/quality/duration/timing/severity/associated sxs/prior treatment) Patient is a 36 y.o. male presenting with vomiting and sinusitis. The history is provided by the patient.  Emesis   Sinusitis   He had been admitted to the hospital for treatment of sinusitis and has been taking his antibiotic at home. He has one day of antibiotic left. Last night at 2:30 AM, he started having recurrent pain around the right infraorbital area and right side of his forehead with some radiation to the left side of the face and forehead. Pain was severe and he rated it at 10 out of 10. Somewhat better if he stands up, worse if he lays down. There's been no associated fever, chills, sweats. He has had some nausea and vomiting. He also has been having some diarrhea which he relates to his antibiotic. He has not taken anything for pain.  Past Medical History  Diagnosis Date  . Paranoid schizophrenia   . Bipolar 1 disorder   . Anxiety   . Hypertension   . Hyperlipidemia   . GERD (gastroesophageal reflux disease)     Past Surgical History  Procedure Date  . Cholecystectomy   . Ankle surgey   . Appendectomy     No family history on file.  History  Substance Use Topics  . Smoking status: Current Everyday Smoker -- 1.0 packs/day    Types: Cigarettes  . Smokeless tobacco: Never Used  . Alcohol Use: No      Review of Systems  Gastrointestinal: Positive for vomiting.  All other systems reviewed and are negative.    Allergies  Compazine; Imitrex; Prochlorperazine edisylate; and Risperidone  Home Medications   Current Outpatient Rx  Name Route Sig Dispense Refill  . ACETAMINOPHEN 500 MG PO TABS Oral Take 1,000 mg by mouth every 6 (six) hours as needed. For pain     . BENZTROPINE MESYLATE 0.5 MG PO TABS Oral Take 0.5 mg by mouth 2 (two) times daily.      Marland Kitchen BUPRENORPHINE 10 MCG/HR TD PTWK Transdermal Place 1 patch onto the skin once a week. On Fridays    . CETIRIZINE HCL 10 MG PO TABS Oral Take 10 mg by mouth every morning.    . CHOLINE FENOFIBRATE 135 MG PO CPDR Oral Take 135 mg by mouth every morning.    Marland Kitchen CLINDAMYCIN HCL 300 MG PO CAPS Oral Take 1 capsule (300 mg total) by mouth 3 (three) times daily.      He already has Rx  . DULOXETINE HCL 60 MG PO CPEP Oral Take 60 mg by mouth every morning.     Marland Kitchen GABAPENTIN 800 MG PO TABS Oral Take 800 mg by mouth 4 (four) times daily.    Marland Kitchen HALOPERIDOL 10 MG PO TABS Oral Take 10 mg by mouth 2 (two) times daily.     Marland Kitchen HALOPERIDOL DECANOATE 100 MG/ML IM SOLN Intramuscular Inject 150 mg into the muscle every 28 (twenty-eight) days.    Marland Kitchen HYDROCODONE-ACETAMINOPHEN 5-325 MG PO TABS Oral Take 1 tablet by mouth every 4 (four) hours as needed. For pain    . HYDROCODONE-ACETAMINOPHEN 5-325 MG PO TABS Oral Take 1 tablet by mouth every 4 (four) hours as needed for pain. 15 tablet 0  . LOPERAMIDE  HCL 2 MG PO CAPS Oral Take 4 mg by mouth 4 (four) times daily as needed. For loose stools    . MELOXICAM 15 MG PO TABS Oral Take 15 mg by mouth daily.      Marland Kitchen METOPROLOL TARTRATE 50 MG PO TABS Oral Take 50 mg by mouth 2 (two) times daily.      Carma Leaven M PLUS PO TABS Oral Take 1 tablet by mouth daily.      Marland Kitchen NIACIN ER (ANTIHYPERLIPIDEMIC) 500 MG PO TBCR Oral Take 500 mg by mouth at bedtime.      . OMEPRAZOLE 40 MG PO CPDR Oral Take 40 mg by mouth daily.      Frazier Butt OP Ophthalmic Apply 1 drop to eye 4 (four) times daily.      . QUETIAPINE FUMARATE 200 MG PO TABS Oral Take 200 mg by mouth 4 (four) times daily.    . TRAZODONE HCL 150 MG PO TABS Oral Take 300 mg by mouth at bedtime.      . TRIAMCINOLONE ACETONIDE 0.1 % EX CREA Topical Apply 1 application topically 3 (three) times daily.      BP 135/60  Pulse 87  Temp(Src) 98.1 F  (36.7 C) (Oral)  Resp 18  Ht 6\' 4"  (1.93 m)  Wt 332 lb (150.594 kg)  BMI 40.41 kg/m2  SpO2 100%  Physical Exam  Nursing note and vitals reviewed. 36 year old male who morbidly obese. He is resting comfortably and in no acute distress. Vital signs are normal. Oxygen saturation is 100% which is normal. Head is normocephalic and atraumatic. PERRLA, EOMI. Oropharynx is clear. There is moderate to severe tenderness to palpation over the right maxillary sinus, with only mild tenderness over the left maxillary sinus and no tenderness over the frontal sinuses. Examination the nasal cavity shows no. Drainage. Oropharynx is clear. Neck is nontender and supple without adenopathy. Lungs are clear without rales, wheezes, rhonchi. Heart has regular rate and rhythm without murmur. Abdomen is soft, flat, nontender without masses or hepatosplenomegaly. Extremities have no cyanosis or edema, full range of motion is present. Skin is warm and dry without rash. Neurologic: Mental status is normal, cranial nerves are intact, there no focal motor or sensory deficits.  ED Course  Procedures (including critical care time)  Results for orders placed during the hospital encounter of 01/19/12  CBC      Component Value Range   WBC 7.5  4.0 - 10.5 (K/uL)   RBC 4.15 (*) 4.22 - 5.81 (MIL/uL)   Hemoglobin 12.0 (*) 13.0 - 17.0 (g/dL)   HCT 91.4 (*) 78.2 - 52.0 (%)   MCV 87.2  78.0 - 100.0 (fL)   MCH 28.9  26.0 - 34.0 (pg)   MCHC 33.1  30.0 - 36.0 (g/dL)   RDW 95.6  21.3 - 08.6 (%)   Platelets 240  150 - 400 (K/uL)  DIFFERENTIAL      Component Value Range   Neutrophils Relative 50  43 - 77 (%)   Neutro Abs 3.7  1.7 - 7.7 (K/uL)   Lymphocytes Relative 35  12 - 46 (%)   Lymphs Abs 2.6  0.7 - 4.0 (K/uL)   Monocytes Relative 10  3 - 12 (%)   Monocytes Absolute 0.7  0.1 - 1.0 (K/uL)   Eosinophils Relative 5  0 - 5 (%)   Eosinophils Absolute 0.4  0.0 - 0.7 (K/uL)   Basophils Relative 0  0 - 1 (%)   Basophils Absolute  0.0  0.0 - 0.1 (K/uL)  BASIC METABOLIC PANEL      Component Value Range   Sodium 137  135 - 145 (mEq/L)   Potassium 3.9  3.5 - 5.1 (mEq/L)   Chloride 101  96 - 112 (mEq/L)   CO2 27  19 - 32 (mEq/L)   Glucose, Bld 101 (*) 70 - 99 (mg/dL)   BUN 10  6 - 23 (mg/dL)   Creatinine, Ser 7.82  0.50 - 1.35 (mg/dL)   Calcium 9.7  8.4 - 95.6 (mg/dL)   GFR calc non Af Amer >90  >90 (mL/min)   GFR calc Af Amer >90  >90 (mL/min)  SEDIMENTATION RATE      Component Value Range   Sed Rate 10  0 - 16 (mm/hr)   Laboratory workup shows no evidence of ongoing significant infection. He was given an injection of hydromorphone and ondansetron with good relief of pain. He already has followup scheduled with ENT and he is to keep that appointment. He is sent home with a prescription for Norco and ondansetron.  1. Facial pain   2. Sinusitis   3. Nausea       MDM  Facial pain which may be related to sinusitis. He reviewed his records and his last CT scan was on one week ago and has shown significant improvement. He is scheduled to see his ENT physician in 4 days. Laboratory workup will be done including sedimentation rate and WBC. Consideration will be given to repeat CT scan of the sinuses, however, I am concerned about the number of CT scans he has had been doing she'll try and limit his radiation exposure.        Dione Booze, MD 01/20/12 [phone]

## 2012-01-19 NOTE — ED Notes (Signed)
Pt here for sinus pressure and n/v x several days.   Pt  Admitted previous time for sinus infection.

## 2012-01-19 NOTE — Discharge Instructions (Signed)
Keep your scheduled appointment with Dr. Pollyann Kennedy. Call him if you have any problems before then. Finish taking your antibiotic prescription according to the directions on the bottle.

## 2012-01-23 ENCOUNTER — Encounter (HOSPITAL_COMMUNITY): Payer: Self-pay | Admitting: *Deleted

## 2012-01-23 ENCOUNTER — Other Ambulatory Visit (HOSPITAL_COMMUNITY): Payer: Self-pay | Admitting: *Deleted

## 2012-01-23 DIAGNOSIS — J328 Other chronic sinusitis: Secondary | ICD-10-CM | POA: Diagnosis not present

## 2012-01-23 DIAGNOSIS — J0110 Acute frontal sinusitis, unspecified: Secondary | ICD-10-CM | POA: Diagnosis not present

## 2012-01-23 NOTE — Interval H&P Note (Signed)
History and Physical Interval Note:  01/23/2012 4:39 PM  [name]  has presented today for surgery, with the diagnosis of chronic sinusitis left side  The various methods of treatment have been discussed with the patient and family. After consideration of risks, benefits and other options for treatment, the patient has consented to  Procedure(s) (LRB): ENDOSCOPIC SINUS SURGERY (Left) as a surgical intervention .  The patients' history has been reviewed, patient examined, no change in status, stable for surgery.  I have reviewed the patients' chart and labs.  Questions were answered to the patient's satisfaction.     [name]

## 2012-01-23 NOTE — Interval H&P Note (Signed)
History and Physical Interval Note:  01/23/2012 4:38 PM  [name]  has presented today for surgery, with the diagnosis of chronic sinusitis left side  The various methods of treatment have been discussed with the patient and family. After consideration of risks, benefits and other options for treatment, the patient has consented to  Procedure(s) (LRB): ENDOSCOPIC SINUS SURGERY (Left) as a surgical intervention .  The patients' history has been reviewed, patient examined, no change in status, stable for surgery.  I have reviewed the patients' chart and labs.  Questions were answered to the patient's satisfaction.     Saleh Ulbrich

## 2012-01-23 NOTE — H&P (View-Only) (Signed)
[name] is an 35 y.o. male.   Chief Complaint: Left eye swelling HPI: 2-3 day history of nasal congestion mainly on the left side. This morning his left eye started to swell. He was seen at the [facility] earlier today and then transferred to [facility]. He has no prior nasal or sinus history. He complains of severe pain around the left eye and visual loss in the eye.  Past Medical History  Diagnosis Date  . Paranoid schizophrenia   . Bipolar 1 disorder   . Anxiety   . Hypertension   . Hyperlipidemia   . GERD (gastroesophageal reflux disease)     Past Surgical History  Procedure Date  . Cholecystectomy   . Ankle surgey   . Appendectomy     History reviewed. No pertinent family history. Social History:  reports that he has been smoking Cigarettes.  He has been smoking about 1 pack per day. He does not have any smokeless tobacco history on file. He reports that he does not drink alcohol or use illicit drugs.  Allergies:  Allergies  Allergen Reactions  . Compazine   . Imitrex (Sumatriptan Base) Nausea And Vomiting  . Prochlorperazine Edisylate     REACTION: Rash  . Risperidone     REACTION: Tremors    Medications Prior to Admission  Medication Dose Route Frequency Provider Last Rate Last Dose  . clindamycin (CLEOCIN) IVPB 600 mg  600 mg Intravenous Once Julie L Idol, PA   600 mg at 01/06/12 1542  . HYDROmorphone (DILAUDID) injection 1 mg  1 mg Intravenous Once Julie L Idol, PA   1 mg at 01/06/12 1721  . iohexol (OMNIPAQUE) 300 MG/ML solution 75 mL  75 mL Intravenous Once PRN Medication Radiologist, MD   75 mL at 01/06/12 1614  . ketorolac (TORADOL) 30 MG/ML injection 30 mg  30 mg Intravenous Once Julie L Idol, PA   30 mg at 01/06/12 1720  . morphine 4 MG/ML injection 4 mg  4 mg Intravenous Once Julie L Idol, PA   4 mg at 01/06/12 1542  . ondansetron (ZOFRAN) injection 4 mg  4 mg Intravenous Once Julie L Idol, PA   4 mg at 01/06/12 1542  . sodium chloride 0.9 % bolus  1,000 mL  1,000 mL Intravenous Once Julie L Idol, PA   1,000 mL at 01/06/12 1532   Medications Prior to Admission  Medication Sig Dispense Refill  . benztropine (COGENTIN) 0.5 MG tablet Take 0.5 mg by mouth 2 (two) times daily.        . DULoxetine (CYMBALTA) 60 MG capsule Take 60 mg by mouth every morning.       . gabapentin (NEURONTIN) 300 MG capsule Take 300 mg by mouth 3 (three) times daily.      . haloperidol (HALDOL) 10 MG tablet Take 10 mg by mouth 2 (two) times daily.       . meloxicam (MOBIC) 15 MG tablet Take 15 mg by mouth daily.        . metoprolol (LOPRESSOR) 50 MG tablet Take 50 mg by mouth 2 (two) times daily.        . Multiple Vitamins-Minerals (MULTIVITAMINS THER. W/MINERALS) TABS Take 1 tablet by mouth daily.        . niacin (NIASPAN) 500 MG CR tablet Take 500 mg by mouth at bedtime.        . omeprazole (PRILOSEC) 40 MG capsule Take 40 mg by mouth daily.        .   Polyethyl Glycol-Propyl Glycol (SYSTANE OP) Apply 1 drop to eye 4 (four) times daily.        . QUEtiapine (SEROQUEL) 200 MG tablet Take 200 mg by mouth 4 (four) times daily.      . traZODone (DESYREL) 150 MG tablet Take 300 mg by mouth at bedtime.        . haloperidol decanoate (HALDOL DECANOATE) 100 MG/ML injection Inject 100 mg into the muscle every 28 (twenty-eight) days. Had injection on February 7th..        No results found for this or any previous visit (from the past 48 hour(s)). Ct Orbits W/cm  01/06/2012  *RADIOLOGY REPORT*  Clinical Data: Left periorbital pain and swelling.  CT ORBITS WITH CONTRAST  Technique:  Multidetector CT imaging of the orbits was performed following the bolus administration of intravenous contrast.  Contrast: 75mL OMNIPAQUE IOHEXOL 300 MG/ML IV SOLN  Comparison: 05/04/2011  Findings: There is superficial soft tissue swelling in the periorbital region on the left with extension up into the soft tissues of the forehead.  Findings are consistent with cellulitis. There is no nonenhancing  collection to suggest abscess.  Changes are all preseptal.  No post-septal inflammation is seen.  The globes, optic nerves, extraocular muscles and lacrimal glands are normal.  The left frontal sinus is opacified. Margins of the sinuses are slightly indistinct.  There could conceivably be osteomyelitis. There is mucosal thickening in the right frontal sinus.  The ethmoid air cells on the left are opacified.  The maxillary sinus on the left is opacified and there is mucoperiosteal thickening indicating chronic inflammation.  Limited visualization of the intracranial contents does not show any abnormality.  IMPRESSION: Superficial soft tissue swelling in the left periorbital region and and forehead region.  No evidence of frank abscess.  No post-septal inflammatory change.  Underlying paranasal sinusitis on the left.  This is probably the etiology of the inflammation.  Questionable irregularity of the bony margins of the left frontal sinus raise the possibility of osteomyelitis.  Original Report Authenticated By: MARK E. SHOGRY, M.D.    ROS: otherwise negative  Blood pressure 123/82, pulse 81, temperature 97.8 F (36.6 C), temperature source Oral, resp. rate 16, SpO2 97.00%.  PHYSICAL EXAM: Overall appearance:  Healthy appearing, in discomfort secondary to the eye pain. Head:  Normocephalic, atraumatic. Ears: External auditory canals are clear; tympanic membranes are intact and the middle ears are free of any effusion. Nose: External nose is healthy in appearance. Internal nasal exam reveals pus draining from the left middle meatus. There is swelling of the infindibular mucosa. Right side clear. Oral Cavity:  There are no mucosal lesions or masses identified. Mucous membranes very dry. Oral Pharynx/Hypopharynx/Larynx: no signs of any mucosal lesions or masses identified.  Eye: Left superior eye lid swelling. I am able to open the eye but with tremendous discomfort. Pupil is round and reactive. He  complains of significant visual loss.  Neck: No palpable neck masses.  Studies Reviewed: Orbital CT reviewed.    Assessment/Plan Orbital cellulitis secondary to sinusitis. Recommend Immediate initiation of IV antibiotics. Will consult ID and Ophthalmology. Will need dedicated CT of sinuses. May need surgical intervention. Will admit and watch closely.  Lachell Rochette 01/06/2012, 10:58 PM    

## 2012-01-23 NOTE — H&P (View-Only) (Signed)
  Patient's caretaker had called me earlier this evening to tell me that he was getting worse again with his swelling. He arrived in the emergency department. I had him undergo blood count and CT scan imaging. White blood cell count is normal.  I have reviewed the CT imaging with radiology and there has been interval improvement of the sinus disease, complete resolution of the orbital cellulitis, and no other abnormal findings. On exam, his eye looks healthy and clear. There is no periorbital swelling. He was complaining about 4 had swelling but there is no identifiable forehead swelling. I have explained to him that he is doing much better and should continue with his clindamycin and followup with me next week. 

## 2012-01-24 ENCOUNTER — Encounter (HOSPITAL_COMMUNITY): Payer: Self-pay | Admitting: Certified Registered"

## 2012-01-24 ENCOUNTER — Encounter (HOSPITAL_COMMUNITY)
Admission: RE | Disposition: A | Discharge status: Home / Self Care | Payer: Self-pay | Source: Ambulatory Visit | Attending: Otolaryngology

## 2012-01-24 ENCOUNTER — Ambulatory Visit (HOSPITAL_COMMUNITY): Payer: Medicare Other | Admitting: Certified Registered"

## 2012-01-24 ENCOUNTER — Ambulatory Visit (HOSPITAL_COMMUNITY)
Admission: RE | Admit: 2012-01-24 | Discharge: 2012-01-24 | Disposition: A | Discharge status: Home / Self Care | Payer: Medicare Other | Source: Ambulatory Visit | Attending: Otolaryngology | Admitting: Otolaryngology

## 2012-01-24 DIAGNOSIS — K449 Diaphragmatic hernia without obstruction or gangrene: Secondary | ICD-10-CM | POA: Insufficient documentation

## 2012-01-24 DIAGNOSIS — J329 Chronic sinusitis, unspecified: Secondary | ICD-10-CM | POA: Diagnosis not present

## 2012-01-24 DIAGNOSIS — I10 Essential (primary) hypertension: Secondary | ICD-10-CM | POA: Diagnosis not present

## 2012-01-24 DIAGNOSIS — F209 Schizophrenia, unspecified: Secondary | ICD-10-CM | POA: Insufficient documentation

## 2012-01-24 DIAGNOSIS — K219 Gastro-esophageal reflux disease without esophagitis: Secondary | ICD-10-CM | POA: Diagnosis not present

## 2012-01-24 DIAGNOSIS — F17200 Nicotine dependence, unspecified, uncomplicated: Secondary | ICD-10-CM | POA: Diagnosis not present

## 2012-01-24 DIAGNOSIS — J320 Chronic maxillary sinusitis: Secondary | ICD-10-CM | POA: Diagnosis not present

## 2012-01-24 DIAGNOSIS — F319 Bipolar disorder, unspecified: Secondary | ICD-10-CM | POA: Diagnosis not present

## 2012-01-24 DIAGNOSIS — M1990 Unspecified osteoarthritis, unspecified site: Secondary | ICD-10-CM | POA: Diagnosis not present

## 2012-01-24 DIAGNOSIS — J322 Chronic ethmoidal sinusitis: Secondary | ICD-10-CM | POA: Diagnosis not present

## 2012-01-24 DIAGNOSIS — K08109 Complete loss of teeth, unspecified cause, unspecified class: Secondary | ICD-10-CM | POA: Insufficient documentation

## 2012-01-24 DIAGNOSIS — J321 Chronic frontal sinusitis: Secondary | ICD-10-CM | POA: Insufficient documentation

## 2012-01-24 HISTORY — DX: Other complications of anesthesia, initial encounter: T88.59XA

## 2012-01-24 HISTORY — PX: NASAL SINUS SURGERY: SHX719

## 2012-01-24 HISTORY — DX: Adverse effect of unspecified anesthetic, initial encounter: T41.45XA

## 2012-01-24 SURGERY — SINUS SURGERY, ENDOSCOPIC
Anesthesia: General | Site: Nose | Laterality: Left | Wound class: Clean Contaminated

## 2012-01-24 MED ORDER — CLINDAMYCIN HCL 300 MG PO CAPS: 300.0000 mg | ORAL_CAPSULE | Freq: Three times a day (TID) | ORAL | Status: DC

## 2012-01-24 MED ORDER — OXYMETAZOLINE HCL 0.05 % NA SOLN
NASAL | Status: DC | PRN
  Administered 2012-01-24: 1 via NASAL

## 2012-01-24 MED ORDER — LIDOCAINE HCL (CARDIAC) 20 MG/ML IV SOLN
INTRAVENOUS | Status: DC | PRN
  Administered 2012-01-24: 100 mg via INTRAVENOUS

## 2012-01-24 MED ORDER — METOPROLOL TARTRATE 50 MG PO TABS: 50.0000 mg | ORAL_TABLET | Freq: Two times a day (BID) | ORAL | Status: DC

## 2012-01-24 MED ORDER — CLINDAMYCIN HCL 300 MG PO CAPS: 300.0000 mg | ORAL_CAPSULE | Freq: Three times a day (TID) | ORAL | Status: AC

## 2012-01-24 MED ORDER — LACTATED RINGERS IV SOLN
INTRAVENOUS | Status: DC
  Administered 2012-01-24: 10:00:00 via INTRAVENOUS

## 2012-01-24 MED ORDER — LABETALOL HCL 5 MG/ML IV SOLN
INTRAVENOUS | Status: DC | PRN
  Administered 2012-01-24: 10 mg via INTRAVENOUS

## 2012-01-24 MED ORDER — CLINDAMYCIN PHOSPHATE 600 MG/50ML IV SOLN
600.0000 mg | Freq: Once | INTRAVENOUS | Status: AC
  Administered 2012-01-24: 600 mg via INTRAVENOUS
  Filled 2012-01-24: qty 50

## 2012-01-24 MED ORDER — SUFENTANIL CITRATE 50 MCG/ML IV SOLN
INTRAVENOUS | Status: DC | PRN
  Administered 2012-01-24: 20 ug via INTRAVENOUS
  Administered 2012-01-24: 10 ug via INTRAVENOUS
  Administered 2012-01-24: 20 ug via INTRAVENOUS

## 2012-01-24 MED ORDER — FENTANYL CITRATE 0.05 MG/ML IJ SOLN
INTRAMUSCULAR | Status: DC | PRN
  Administered 2012-01-24: 50 ug via INTRAVENOUS
  Administered 2012-01-24: 100 ug via INTRAVENOUS
  Administered 2012-01-24 (×2): 50 ug via INTRAVENOUS
  Administered 2012-01-24: 100 ug via INTRAVENOUS

## 2012-01-24 MED ORDER — GLYCOPYRROLATE 0.2 MG/ML IJ SOLN
INTRAMUSCULAR | Status: DC | PRN
  Administered 2012-01-24: 0.4 mg via INTRAVENOUS

## 2012-01-24 MED ORDER — HYDROMORPHONE HCL PF 1 MG/ML IJ SOLN
0.2500 mg | INTRAMUSCULAR | Status: DC | PRN
  Administered 2012-01-24: 0.25 mg via INTRAVENOUS

## 2012-01-24 MED ORDER — METOPROLOL TARTRATE 50 MG PO TABS
ORAL_TABLET | ORAL | Status: AC
  Administered 2012-01-24: 50 mg via OROMUCOSAL
  Filled 2012-01-24: qty 1

## 2012-01-24 MED ORDER — LIDOCAINE HCL 4 % MT SOLN
OROMUCOSAL | Status: DC | PRN
  Administered 2012-01-24: 4 mL via TOPICAL

## 2012-01-24 MED ORDER — MUPIROCIN 2 % EX OINT
TOPICAL_OINTMENT | CUTANEOUS | Status: AC
  Administered 2012-01-24: 1 via NASAL
  Filled 2012-01-24: qty 22

## 2012-01-24 MED ORDER — HYDROCODONE-ACETAMINOPHEN 5-325 MG PO TABS: 1.0000 | ORAL_TABLET | ORAL | Status: DC | PRN

## 2012-01-24 MED ORDER — ONDANSETRON HCL 4 MG/2ML IJ SOLN
INTRAMUSCULAR | Status: DC | PRN
  Administered 2012-01-24: 4 mg via INTRAVENOUS

## 2012-01-24 MED ORDER — HYDROCODONE-ACETAMINOPHEN 5-325 MG PO TABS: 1.0000 | ORAL_TABLET | ORAL | Status: AC | PRN

## 2012-01-24 MED ORDER — ONDANSETRON HCL 4 MG/2ML IJ SOLN: 4.0000 mg | Freq: Once | INTRAMUSCULAR | Status: DC | PRN

## 2012-01-24 MED ORDER — SODIUM CHLORIDE 0.9 % IR SOLN
Status: DC | PRN
  Administered 2012-01-24 (×2): 1000 mL

## 2012-01-24 MED ORDER — ROCURONIUM BROMIDE 100 MG/10ML IV SOLN
INTRAVENOUS | Status: DC | PRN
  Administered 2012-01-24: 30 mg via INTRAVENOUS
  Administered 2012-01-24: 50 mg via INTRAVENOUS

## 2012-01-24 MED ORDER — HYDROCODONE-ACETAMINOPHEN 7.5-500 MG PO TABS: 1.0000 | ORAL_TABLET | Freq: Four times a day (QID) | ORAL | Status: AC | PRN

## 2012-01-24 MED ORDER — LACTATED RINGERS IV SOLN
INTRAVENOUS | Status: DC | PRN
  Administered 2012-01-24 (×2): via INTRAVENOUS

## 2012-01-24 MED ORDER — DEXAMETHASONE SODIUM PHOSPHATE 4 MG/ML IJ SOLN
INTRAMUSCULAR | Status: DC | PRN
  Administered 2012-01-24: 10 mg via INTRAVENOUS

## 2012-01-24 MED ORDER — NEOSTIGMINE METHYLSULFATE 1 MG/ML IJ SOLN
INTRAMUSCULAR | Status: DC | PRN
  Administered 2012-01-24: 3 mg via INTRAVENOUS

## 2012-01-24 MED ORDER — LIDOCAINE-EPINEPHRINE 1 %-1:100000 IJ SOLN
INTRAMUSCULAR | Status: DC | PRN
  Administered 2012-01-24: 4 mL

## 2012-01-24 MED ORDER — MIDAZOLAM HCL 5 MG/5ML IJ SOLN
INTRAMUSCULAR | Status: DC | PRN
  Administered 2012-01-24 (×2): 2 mg via INTRAVENOUS

## 2012-01-24 MED ORDER — PROPOFOL 10 MG/ML IV EMUL
INTRAVENOUS | Status: DC | PRN
  Administered 2012-01-24: 200 mg via INTRAVENOUS

## 2012-01-24 SURGICAL SUPPLY — 31 items
ATTRACTOMAT 16X20 MAGNETIC DRP (DRAPES) ×2 IMPLANT
BLADE RAD40 ROTATE 4M 4 5PK (BLADE) IMPLANT
BLADE RAD60 ROTATE M4 4 5PK (BLADE) IMPLANT
BLADE SINUS RAD 90DEG (BLADE) ×2 IMPLANT
BLADE TRICUT ROTATE M4 4 5PK (BLADE) ×2 IMPLANT
CANISTER SUCTION 2500CC (MISCELLANEOUS) ×2 IMPLANT
CLOTH BEACON ORANGE TIMEOUT ST (SAFETY) ×2 IMPLANT
DRESSING NASAL KENNEDY 3.5X.9 (MISCELLANEOUS) IMPLANT
DRSG NASAL KENNEDY 3.5X.9 (MISCELLANEOUS)
DRSG NASOPORE 8CM (GAUZE/BANDAGES/DRESSINGS) ×4 IMPLANT
ELECT REM PT RETURN 9FT ADLT (ELECTROSURGICAL) ×2
ELECTRODE REM PT RTRN 9FT ADLT (ELECTROSURGICAL) ×1 IMPLANT
GLOVE ECLIPSE 7.5 STRL STRAW (GLOVE) ×4 IMPLANT
GOWN STRL NON-REIN LRG LVL3 (GOWN DISPOSABLE) ×4 IMPLANT
KIT BASIN OR (CUSTOM PROCEDURE TRAY) ×2 IMPLANT
KIT ROOM TURNOVER OR (KITS) ×2 IMPLANT
NEEDLE 27GAX1X1/2 (NEEDLE) ×2 IMPLANT
NS IRRIG 1000ML POUR BTL (IV SOLUTION) ×2 IMPLANT
PAD ARMBOARD 7.5X6 YLW CONV (MISCELLANEOUS) ×4 IMPLANT
PATTIES SURGICAL .5 X3 (DISPOSABLE) ×2 IMPLANT
SHEATH ENDOSCRUB 0 DEG (SHEATH) IMPLANT
SHEATH ENDOSCRUB 30 DEG (SHEATH) IMPLANT
SPECIMEN JAR SMALL (MISCELLANEOUS) ×2 IMPLANT
SWAB COLLECTION DEVICE MRSA (MISCELLANEOUS) IMPLANT
SYR 50ML SLIP (SYRINGE) IMPLANT
TOWEL OR 17X24 6PK STRL BLUE (TOWEL DISPOSABLE) ×2 IMPLANT
TOWEL OR 17X26 10 PK STRL BLUE (TOWEL DISPOSABLE) ×2 IMPLANT
TRAY ENT MC OR (CUSTOM PROCEDURE TRAY) ×2 IMPLANT
TUBE ANAEROBIC SPECIMEN COL (MISCELLANEOUS) IMPLANT
TUBING EXTENTION W/L.L. (IV SETS) ×2 IMPLANT
WATER STERILE IRR 1000ML POUR (IV SOLUTION) ×2 IMPLANT

## 2012-01-24 NOTE — Anesthesia Postprocedure Evaluation (Signed)
  Anesthesia Post-op Note  Patient: [name]  Procedure(s) Performed: Procedure(s) (LRB): ENDOSCOPIC SINUS SURGERY (Left)  Patient Location: PACU  Anesthesia Type: General  Level of Consciousness: awake, alert  and oriented  Airway and Oxygen Therapy: Patient Spontanous Breathing  Post-op Pain: mild  Post-op Assessment: Post-op Vital signs reviewed and Patient's Cardiovascular Status Stable  Post-op Vital Signs: stable  Complications: No apparent anesthesia complications

## 2012-01-24 NOTE — Progress Notes (Signed)
Report given to kay rn as caregiver 

## 2012-01-24 NOTE — Preoperative (Signed)
Beta Blockers   Reason not to administer Beta Blockers:Not Applicable 

## 2012-01-24 NOTE — Interval H&P Note (Signed)
History and Physical Interval Note:  01/24/2012 9:44 AM  [name]  has presented today for surgery, with the diagnosis of chronic sinusitis left side  The various methods of treatment have been discussed with the patient and family. After consideration of risks, benefits and other options for treatment, the patient has consented to  Procedure(s) (LRB): ENDOSCOPIC SINUS SURGERY (Left) as a surgical intervention .  The patients' history has been reviewed, patient examined, no change in status, stable for surgery.  I have reviewed the patients' chart and labs.  Questions were answered to the patient's satisfaction.     [name]

## 2012-01-24 NOTE — Anesthesia Preprocedure Evaluation (Addendum)
Anesthesia Evaluation  Patient identified by MRN, date of birth, ID band Patient awake    Reviewed: Allergy & Precautions, H&P , NPO status , Patient's Chart, lab work & pertinent test results, reviewed documented beta blocker date and time   History of Anesthesia Complications Negative for: history of anesthetic complications  Airway Mallampati: II TM Distance: >3 FB Neck ROM: Full    Dental  (+) Edentulous Upper, Edentulous Lower and Dental Advisory Given   Pulmonary Current Smoker,  breath sounds clear to auscultation        Cardiovascular Exercise Tolerance: Good hypertension, Pt. on medications and Pt. on home beta blockers Rhythm:Regular Rate:Normal     Neuro/Psych PSYCHIATRIC DISORDERS Anxiety Depression Bipolar Disorder Schizophrenia  Neuromuscular disease (Chronic back pain)    GI/Hepatic Neg liver ROS, hiatal hernia, GERD-  Medicated and Controlled,  Endo/Other  Morbid obesity  Renal/GU negative Renal ROS  negative genitourinary   Musculoskeletal  (+) Arthritis - (back), Osteoarthritis,    Abdominal (+) + obese,   Peds negative pediatric ROS (+)  Hematology negative hematology ROS (+)   Anesthesia Other Findings   Reproductive/Obstetrics negative OB ROS                          Anesthesia Physical Anesthesia Plan  ASA: III  Anesthesia Plan: General   Post-op Pain Management:    Induction: Intravenous  Airway Management Planned: Oral ETT  Additional Equipment:   Intra-op Plan:   Post-operative Plan: Extubation in OR  Informed Consent: I have reviewed the patients History and Physical, chart, labs and discussed the procedure including the risks, benefits and alternatives for the proposed anesthesia with the patient or authorized representative who has indicated his/her understanding and acceptance.     Plan Discussed with:   Anesthesia Plan Comments: (Chronic  sinusitis Paranoid schizophrenia, bipolar disorder Smoker htn Obesity  Plan GA  Kipp Brood, MD)        Anesthesia Quick Evaluation

## 2012-01-24 NOTE — Discharge Instructions (Signed)
No nose blowing or physical activity for 2 weeks.

## 2012-01-24 NOTE — Op Note (Signed)
OPERATIVE REPORT  DATE OF SURGERY: 01/24/2012  PATIENT:  [name],  37 y.o. male  PRE-OPERATIVE DIAGNOSIS:  chronic sinusitis left side  POST-OPERATIVE DIAGNOSIS:  chronic sinusitis left side  PROCEDURE:  Procedure(s): ENDOSCOPIC SINUS SURGERY  SURGEON:  Susy Frizzle, MD  ASSISTANTS: none   ANESTHESIA:   general  EBL:  120 ml  DRAINS: none   LOCAL MEDICATIONS USED:  OTHER Xylocaine with epinephrine  SPECIMEN:  Source of Specimen:  Right sinus contents  COUNTS:  YES  PROCEDURE DETAILS: Patient was taken to the operating room and placed on the operating table in the supine position. Following induction of general endotracheal anesthesia the face was prepped and draped in a standard fashion. Afrin spray was used preoperatively. 1% Xylocaine with epinephrine was infiltrated into the superior and posterior attachments of the left middle turbinate and the left lateral nasal wall.  Left endoscopic total ethmoidectomy. 0 and 30 nasal endoscopes were used. A sickle knife was used to incise the base of the left uncinate process. This was then removed using Wile forceps. The ethmoid bulla was exposed. Ethmoid dissection was then performed using the microdebrider with a straight blade. The lateral limit of dissection was the lamina papyracea which was kept intact. The superior limit was the fovea which was also kept intact. The ground lamella was taken down to expose the posterior cells. There was diffuse inflammatory changes noted.  Maxillary antrostomy: After the bulla was opened a 30 endoscope and a curved suction was used to inspect the fontanelle. An antrostomy was created and enlarged anteriorly using a backbiting forceps, and posteriorly using the through cut forceps. Severe and diffuse inflammatory and polypoid changes were noted throughout the maxillary sinus. Much of this was removed.  Frontal sinusotomy. After the ethmoid dissection was completed a curved suction and  30 endoscope were used to inspect the frontal recess. The frontal sinus was entered with the suction and a large amount of polypoid tissue was obstructing the ethmoid. This was removed using giraffe forceps. At the termination of the procedure, the ethmoid cavity and frontal recess were packed with Meza poor packing. The pharynx was suctioned of blood and secretions. The patient was then awakened extubated and transferred to recovery in stable condition.   PATIENT DISPOSITION:  PACU - hemodynamically stable.

## 2012-01-24 NOTE — Transfer of Care (Signed)
Immediate Anesthesia Transfer of Care Note  Patient: [name]  Procedure(s) Performed: Procedure(s) (LRB): ENDOSCOPIC SINUS SURGERY (Left)  Patient Location: PACU  Anesthesia Type: General  Level of Consciousness: awake, alert , oriented and patient cooperative  Airway & Oxygen Therapy: Patient Spontanous Breathing  Post-op Assessment: Report given to PACU RN, Post -op Vital signs reviewed and stable and Patient moving all extremities  Post vital signs: Reviewed and stable  Complications: No apparent anesthesia complications

## 2012-01-28 ENCOUNTER — Encounter (HOSPITAL_COMMUNITY): Payer: Self-pay | Admitting: Otolaryngology

## 2012-02-28 ENCOUNTER — Emergency Department (HOSPITAL_COMMUNITY)
Admission: EM | Admit: 2012-02-28 | Discharge: 2012-02-28 | Disposition: A | Discharge status: Home / Self Care | Payer: Medicare Other | Attending: Emergency Medicine | Admitting: Emergency Medicine

## 2012-02-28 ENCOUNTER — Encounter (HOSPITAL_COMMUNITY): Payer: Self-pay | Admitting: Emergency Medicine

## 2012-02-28 DIAGNOSIS — Z79899 Other long term (current) drug therapy: Secondary | ICD-10-CM | POA: Diagnosis not present

## 2012-02-28 DIAGNOSIS — K219 Gastro-esophageal reflux disease without esophagitis: Secondary | ICD-10-CM | POA: Insufficient documentation

## 2012-02-28 DIAGNOSIS — I10 Essential (primary) hypertension: Secondary | ICD-10-CM | POA: Diagnosis not present

## 2012-02-28 DIAGNOSIS — R339 Retention of urine, unspecified: Secondary | ICD-10-CM | POA: Diagnosis not present

## 2012-02-28 DIAGNOSIS — N342 Other urethritis: Secondary | ICD-10-CM | POA: Diagnosis not present

## 2012-02-28 DIAGNOSIS — F200 Paranoid schizophrenia: Secondary | ICD-10-CM | POA: Diagnosis not present

## 2012-02-28 DIAGNOSIS — M539 Dorsopathy, unspecified: Secondary | ICD-10-CM | POA: Diagnosis not present

## 2012-02-28 DIAGNOSIS — F319 Bipolar disorder, unspecified: Secondary | ICD-10-CM | POA: Insufficient documentation

## 2012-02-28 DIAGNOSIS — E785 Hyperlipidemia, unspecified: Secondary | ICD-10-CM | POA: Insufficient documentation

## 2012-02-28 DIAGNOSIS — R300 Dysuria: Secondary | ICD-10-CM | POA: Diagnosis not present

## 2012-02-28 LAB — URINALYSIS, ROUTINE W REFLEX MICROSCOPIC
Ketones, ur: NEGATIVE mg/dL
Leukocytes, UA: NEGATIVE
Nitrite: NEGATIVE
Urobilinogen, UA: 0.2 mg/dL (ref 0.0–1.0)
pH: 5.5 (ref 5.0–8.0)

## 2012-02-28 MED ORDER — ACETAMINOPHEN 500 MG PO TABS
1000.0000 mg | ORAL_TABLET | Freq: Once | ORAL | Status: AC
  Administered 2012-02-28: 1000 mg via ORAL
  Filled 2012-02-28: qty 2

## 2012-02-28 MED ORDER — IBUPROFEN 800 MG PO TABS
800.0000 mg | ORAL_TABLET | Freq: Once | ORAL | Status: AC
  Administered 2012-02-28: 800 mg via ORAL
  Filled 2012-02-28: qty 1

## 2012-02-28 MED ORDER — CIPROFLOXACIN HCL 500 MG PO TABS: 500.0000 mg | ORAL_TABLET | Freq: Two times a day (BID) | ORAL | Status: AC

## 2012-02-28 NOTE — ED Notes (Signed)
Right flank pain and dysuria since yesterday

## 2012-02-28 NOTE — Discharge Instructions (Signed)
Urethritis, Adult Urethritis is an inflammation (soreness) of the urethra (the tube exiting from the bladder). It is often caused by germs that may be spread through sexual contact. TREATMENT  Urethritis will usually respond to antibiotics. These are medications that kill germs. Take all the medicine given to you. You may feel better in a couple days, but TAKE ALL MEDICINE or the infection may not be completely cured and may become more difficult to treat. Response can generally be expected in 7 to 10 days. You may require additional treatment after more testing. HOME CARE INSTRUCTIONS  Not have sex until the test results are known and treatment is completed.   Know that you may be asked to notify your sex partner when your final test results are back.   Finish all medications as prescribed.   Prevent sexually transmitted infections including AIDS. Practice safe sex. Use condoms.  SEEK MEDICAL CARE IF:   Your symptoms are not improved in 2 to 3 days.   Your symptoms are getting worse.   Your develop abdominal pain.   You develop joint pain.  SEEK IMMEDIATE MEDICAL CARE IF:   You have a fever.   You develop severe pain in the belly, back or side.   You develop repeated vomiting.  TEST RESULTS Not all test results are available during your visit. If your test results are not back during the visit, make an appointment with your caregiver to find out the results. Do not assume everything is normal if you have not heard from your caregiver or the medical facility. It is important for you to follow-up on all of your test results. Document Released: 04/23/2001 Document Revised: 10/17/2011 Document Reviewed: 11/13/2009 [facility] Patient Information 2012 [place], [place].  Increase fluids. Antibiotic for one week. Follow up with urologist if not improved.  Number given

## 2012-03-17 DIAGNOSIS — J3089 Other allergic rhinitis: Secondary | ICD-10-CM | POA: Diagnosis not present

## 2012-03-17 DIAGNOSIS — E785 Hyperlipidemia, unspecified: Secondary | ICD-10-CM | POA: Diagnosis not present

## 2012-03-17 DIAGNOSIS — M545 Low back pain: Secondary | ICD-10-CM | POA: Diagnosis not present

## 2012-03-19 DIAGNOSIS — E785 Hyperlipidemia, unspecified: Secondary | ICD-10-CM | POA: Diagnosis not present

## 2012-03-25 ENCOUNTER — Emergency Department (HOSPITAL_COMMUNITY)
Admission: EM | Admit: 2012-03-25 | Discharge: 2012-03-25 | Disposition: A | Discharge status: Home / Self Care | Payer: Medicare Other | Attending: Emergency Medicine | Admitting: Emergency Medicine

## 2012-03-25 ENCOUNTER — Encounter (HOSPITAL_COMMUNITY): Payer: Self-pay | Admitting: *Deleted

## 2012-03-25 DIAGNOSIS — K219 Gastro-esophageal reflux disease without esophagitis: Secondary | ICD-10-CM | POA: Insufficient documentation

## 2012-03-25 DIAGNOSIS — E785 Hyperlipidemia, unspecified: Secondary | ICD-10-CM | POA: Diagnosis not present

## 2012-03-25 DIAGNOSIS — H9209 Otalgia, unspecified ear: Secondary | ICD-10-CM | POA: Insufficient documentation

## 2012-03-25 DIAGNOSIS — F200 Paranoid schizophrenia: Secondary | ICD-10-CM | POA: Diagnosis not present

## 2012-03-25 DIAGNOSIS — H6690 Otitis media, unspecified, unspecified ear: Secondary | ICD-10-CM | POA: Insufficient documentation

## 2012-03-25 DIAGNOSIS — I10 Essential (primary) hypertension: Secondary | ICD-10-CM | POA: Diagnosis not present

## 2012-03-25 DIAGNOSIS — F411 Generalized anxiety disorder: Secondary | ICD-10-CM | POA: Insufficient documentation

## 2012-03-25 DIAGNOSIS — H6691 Otitis media, unspecified, right ear: Secondary | ICD-10-CM

## 2012-03-25 DIAGNOSIS — F319 Bipolar disorder, unspecified: Secondary | ICD-10-CM | POA: Insufficient documentation

## 2012-03-25 DIAGNOSIS — H65199 Other acute nonsuppurative otitis media, unspecified ear: Secondary | ICD-10-CM | POA: Diagnosis not present

## 2012-03-25 MED ORDER — AMOXICILLIN 250 MG PO CAPS
500.0000 mg | ORAL_CAPSULE | Freq: Once | ORAL | Status: AC
  Administered 2012-03-25: 500 mg via ORAL
  Filled 2012-03-25: qty 2

## 2012-03-25 MED ORDER — AMOXICILLIN 500 MG PO CAPS: 500.0000 mg | ORAL_CAPSULE | Freq: Three times a day (TID) | ORAL | Status: AC

## 2012-03-25 NOTE — ED Provider Notes (Signed)
History     CSN: [id number]  Arrival date & time 03/25/12  1703   First MD Initiated Contact with Patient 03/25/12 1713      Chief Complaint  Patient presents with  . Otalgia    (Consider location/radiation/quality/duration/timing/severity/associated sxs/prior treatment) HPI Comments: Hearing loss in R ear for 3-4 days.  Minimal discomfort.  Pt of dr. Juanetta Gosling but his psychiatrist wrote rx  For ceruminex which "is not helping".   No fever or chills.  Patient is a 36 y.o. male presenting with ear pain. The history is provided by the patient. No language interpreter was used.  Otalgia This is a new problem. There is pain in the right ear. The problem occurs constantly. There has been no fever. Associated symptoms include hearing loss. Pertinent negatives include no ear discharge, no headaches, no sore throat, no neck pain, no cough and no rash. His past medical history does not include chronic ear infection, hearing loss or tympanostomy tube.    Past Medical History  Diagnosis Date  . Paranoid schizophrenia   . Bipolar 1 disorder   . Anxiety   . Hypertension   . Hyperlipidemia   . GERD (gastroesophageal reflux disease)   . Complication of anesthesia     Anxiety when he awaken with ET tube still in place    Past Surgical History  Procedure Date  . Cholecystectomy   . Ankle surgey   . Appendectomy   . Nasal sinus surgery 01/24/2012    Procedure: ENDOSCOPIC SINUS SURGERY;  Surgeon: Serena Colonel, MD;  Location: [facility] OR;  Service: ENT;  Laterality: Left;  left ethmoidectomy, maxillary, frontal    Family History  Problem Relation Age of Onset  . Anesthesia problems Mother     History  Substance Use Topics  . Smoking status: Current Everyday Smoker -- 1.0 packs/day for 22 years    Types: Cigarettes  . Smokeless tobacco: Never Used  . Alcohol Use: No      Review of Systems  Constitutional: Negative for fever and chills.  HENT: Positive for hearing loss and ear pain.  Negative for sore throat, neck pain and ear discharge.   Respiratory: Negative for cough.   Skin: Negative for rash.  Neurological: Negative for headaches.  All other systems reviewed and are negative.    Allergies  Compazine; Imitrex; Prochlorperazine edisylate; and Risperidone  Home Medications   Current Outpatient Rx  Name Route Sig Dispense Refill  . ACETAMINOPHEN 500 MG PO TABS Oral Take 1,000 mg by mouth every 6 (six) hours as needed. For pain    . AMOXICILLIN 500 MG PO CAPS Oral Take 1 capsule (500 mg total) by mouth 3 (three) times daily. 30 capsule 0  . BENZTROPINE MESYLATE 0.5 MG PO TABS Oral Take 1 mg by mouth at bedtime.     Marland Kitchen CETIRIZINE HCL 10 MG PO TABS Oral Take 10 mg by mouth every morning.    . CHOLINE FENOFIBRATE 135 MG PO CPDR Oral Take 135 mg by mouth every morning.    Marland Kitchen DOXEPIN HCL 150 MG PO CAPS Oral Take 150 mg by mouth at bedtime.    . DULOXETINE HCL 60 MG PO CPEP Oral Take 60 mg by mouth every morning.     Marland Kitchen GABAPENTIN 800 MG PO TABS Oral Take 800 mg by mouth 4 (four) times daily.    Marland Kitchen HALOPERIDOL 10 MG PO TABS Oral Take 10 mg by mouth at bedtime.     Marland Kitchen HALOPERIDOL DECANOATE 100 MG/ML IM  SOLN Intramuscular Inject 150 mg into the muscle every 28 (twenty-eight) days.    . MELOXICAM 15 MG PO TABS Oral Take 15 mg by mouth every morning.     Marland Kitchen METOPROLOL TARTRATE 50 MG PO TABS Oral Take 50 mg by mouth 2 (two) times daily.      Carma Leaven M PLUS PO TABS Oral Take 1 tablet by mouth every morning.     Marland Kitchen NIACIN ER (ANTIHYPERLIPIDEMIC) 500 MG PO TBCR Oral Take 500 mg by mouth at bedtime.      . OMEPRAZOLE 40 MG PO CPDR Oral Take 40 mg by mouth every morning.     Marland Kitchen SYSTANE OP Ophthalmic Apply 1 drop to eye 4 (four) times daily.      . QUETIAPINE FUMARATE 200 MG PO TABS Oral Take 800 mg by mouth at bedtime.     . TRAZODONE HCL 150 MG PO TABS Oral Take 300 mg by mouth at bedtime.     . TRIAMCINOLONE ACETONIDE 0.1 % EX CREA Topical Apply 1 application topically 3 (three)  times daily.      BP 152/76  Pulse 84  Temp(Src) 97.8 F (36.6 C) (Oral)  Resp 16  Ht 6\' 2"  (1.88 m)  Wt 330 lb (149.687 kg)  BMI 42.37 kg/m2  SpO2 96%  Physical Exam  Nursing note and vitals reviewed. Constitutional: He is oriented to person, place, and time. He appears well-developed and well-nourished.  HENT:  Head: Normocephalic and atraumatic.  Right Ear: External ear and ear canal normal.  Left Ear: Hearing, tympanic membrane, external ear and ear canal normal.  Mouth/Throat: No oropharyngeal exudate.       R TM bulging with mild erythema.  Air/fluid levels visible behind TM c/w otitis media.  Eyes: EOM are normal.  Neck: Normal range of motion. No spinous process tenderness and no muscular tenderness present. No rigidity. Normal range of motion present.  Cardiovascular: Normal rate, regular rhythm, normal heart sounds and intact distal pulses.   Pulmonary/Chest: Effort normal and breath sounds normal. No respiratory distress.  Abdominal: Soft. He exhibits no distension. There is no tenderness.  Musculoskeletal: Normal range of motion.  Lymphadenopathy:       Right cervical: No superficial cervical, no deep cervical and no posterior cervical adenopathy present.      Left cervical: No superficial cervical, no deep cervical and no posterior cervical adenopathy present.  Neurological: He is alert and oriented to person, place, and time.  Skin: Skin is warm and dry.  Psychiatric: He has a normal mood and affect. Judgment normal.    ED Course  Procedures (including critical care time)  Labs Reviewed - No data to display No results found.   1. Right otitis media       MDM  Stop using the ceruminex.  Take the amoxicillin as directed until gone.  F/u with dr. Juanetta Gosling in 7-10 days.  Also continue the nasal steroid spray.        Worthy Rancher, PA 03/25/12 1742  Worthy Rancher, PA 03/25/12 [phone]

## 2012-03-25 NOTE — Discharge Instructions (Signed)
Otitis Media with Effusion Otitis media with effusion is the presence of fluid in the middle ear. This is a common problem that often follows ear infections. It may be present for weeks or longer after the infection. Unlike an acute ear infection, otits media with effusion refers only to fluid behind the ear drum and not infection. Children with repeated ear and sinus infections and allergy problems are the most likely to get otitis media with effusion. CAUSES  The most frequent cause of the fluid buildup is dysfunction of the eustacian tubes. These are the tubes that drain fluid in the ears to the throat. SYMPTOMS   The main symptom of this condition is hearing loss. As a result, you or your child may:   Listen to the TV at a loud volume.   Not respond to questions.   Ask "what" often when spoken to.   There may be a sensation of fullness or pressure but usually not pain.  DIAGNOSIS   Your caregiver will diagnose this condition by examining you or your child's ears.   Your caregiver may test the pressure in you or your child's ear with a tympanometer.   A hearing test may be conducted if the problem persists.   A caregiver will want to re-evaluate the condition periodically to see if it improves.  TREATMENT   Treatment depends on the duration and the effects of the effusion.   Antibiotics, decongestants, nose drops, and cortisone-type drugs may not be helpful.   Children with persistent ear effusions may have delayed language. Children at risk for developmental delays in hearing, learning, and speech may require referral to a specialist earlier than children not at risk.   You or your child's caregiver may suggest a referral to an Ear, Nose, and Throat (ENT) surgeon for treatment. The following may help restore normal hearing:   Drainage of fluid.   Placement of ear tubes (tympanostomy tubes).   Removal of adenoids (adenoidectomy).  HOME CARE INSTRUCTIONS   Avoid second hand  smoke.   Infants who are breast fed are less likely to have this condition.   Avoid feeding infants while laying flat.   Avoid known environmental allergens.   Be sure to see a caregiver or an ENT specialist for follow up.   Avoid people who are sick.  SEEK MEDICAL CARE IF:   Hearing is not better in 3 months.   Hearing is worse.   Ear pain.   Drainage from the ear.   Dizziness.  Document Released: 12/05/2004 Document Revised: 10/17/2011 Document Reviewed: 03/20/2010 [facility] Patient Information 2012 [place], [place].   Stop using the ceruminex.  You have a middle ear infection which required antibiotics.  Follow up with dr.  Juanetta Gosling in 7-10 days.

## 2012-03-25 NOTE — ED Notes (Signed)
Pt states that his right ear has been hurting for a couple days. States that he has been putting wax remover in it but it hasn't helped.

## 2012-03-25 NOTE — ED Provider Notes (Signed)
Medical screening examination/treatment/procedure(s) were performed by non-physician practitioner and as supervising physician I was immediately available for consultation/collaboration.   Allora Bains, MD 03/25/12 1925 

## 2012-03-25 NOTE — ED Notes (Signed)
Alert and oriented x 3. Skin warm and dry. Color pink. No acute distress.

## 2012-03-26 NOTE — ED Provider Notes (Signed)
History     CSN: [id number]  Arrival date & time 02/28/12  1454   First MD Initiated Contact with Patient 02/28/12 1457      Chief Complaint  Patient presents with  . Flank Pain    (Consider location/radiation/quality/duration/timing/severity/associated sxs/prior treatment) HPI.  Right flank pain and dysuria for 24 hours. No fever, chills, hematuria. Nothing makes it better or worse. No radiation. Severity is mild. Quality is sharp  Past Medical History  Diagnosis Date  . Paranoid schizophrenia   . Bipolar 1 disorder   . Anxiety   . Hypertension   . Hyperlipidemia   . GERD (gastroesophageal reflux disease)   . Complication of anesthesia     Anxiety when he awaken with ET tube still in place    Past Surgical History  Procedure Date  . Cholecystectomy   . Ankle surgey   . Appendectomy   . Nasal sinus surgery 01/24/2012    Procedure: ENDOSCOPIC SINUS SURGERY;  Surgeon: Serena Colonel, MD;  Location: [facility] OR;  Service: ENT;  Laterality: Left;  left ethmoidectomy, maxillary, frontal    Family History  Problem Relation Age of Onset  . Anesthesia problems Mother     History  Substance Use Topics  . Smoking status: Current Everyday Smoker -- 1.0 packs/day for 22 years    Types: Cigarettes  . Smokeless tobacco: Never Used  . Alcohol Use: No      Review of Systems  All other systems reviewed and are negative.    Allergies  Compazine; Imitrex; Prochlorperazine edisylate; and Risperidone  Home Medications   Current Outpatient Rx  Name Route Sig Dispense Refill  . ACETAMINOPHEN 500 MG PO TABS Oral Take 1,000 mg by mouth every 6 (six) hours as needed. For pain    . BENZTROPINE MESYLATE 0.5 MG PO TABS Oral Take 1 mg by mouth at bedtime.     Marland Kitchen CETIRIZINE HCL 10 MG PO TABS Oral Take 10 mg by mouth every morning.    . CHOLINE FENOFIBRATE 135 MG PO CPDR Oral Take 135 mg by mouth every morning.    Marland Kitchen DOXEPIN HCL 150 MG PO CAPS Oral Take 150 mg by mouth at bedtime.    .  DULOXETINE HCL 60 MG PO CPEP Oral Take 60 mg by mouth every morning.     Marland Kitchen GABAPENTIN 800 MG PO TABS Oral Take 800 mg by mouth 4 (four) times daily.    Marland Kitchen HALOPERIDOL 10 MG PO TABS Oral Take 10 mg by mouth at bedtime.     Marland Kitchen HALOPERIDOL DECANOATE 100 MG/ML IM SOLN Intramuscular Inject 150 mg into the muscle every 28 (twenty-eight) days.    . MELOXICAM 15 MG PO TABS Oral Take 15 mg by mouth every morning.     Marland Kitchen METOPROLOL TARTRATE 50 MG PO TABS Oral Take 50 mg by mouth 2 (two) times daily.      Carma Leaven M PLUS PO TABS Oral Take 1 tablet by mouth every morning.     Marland Kitchen NIACIN ER (ANTIHYPERLIPIDEMIC) 500 MG PO TBCR Oral Take 500 mg by mouth at bedtime.      . OMEPRAZOLE 40 MG PO CPDR Oral Take 40 mg by mouth every morning.     Marland Kitchen SYSTANE OP Ophthalmic Apply 1 drop to eye 4 (four) times daily.      . QUETIAPINE FUMARATE 200 MG PO TABS Oral Take 800 mg by mouth at bedtime.     . TRAZODONE HCL 150 MG PO TABS Oral Take 300  mg by mouth at bedtime.     . AMOXICILLIN 500 MG PO CAPS Oral Take 1 capsule (500 mg total) by mouth 3 (three) times daily. 30 capsule 0  . TRIAMCINOLONE ACETONIDE 0.1 % EX CREA Topical Apply 1 application topically 3 (three) times daily.      BP 145/72  Pulse 86  Temp 98.2 F (36.8 C)  Resp 18  Wt 300 lb (136.079 kg)  SpO2 97%  Physical Exam  Nursing note and vitals reviewed. Constitutional: He is oriented to person, place, and time. He appears well-developed and well-nourished.  HENT:  Head: Normocephalic and atraumatic.  Eyes: Conjunctivae and EOM are normal. Pupils are equal, round, and reactive to light.  Neck: Normal range of motion. Neck supple.  Cardiovascular: Normal rate and regular rhythm.   Pulmonary/Chest: Effort normal and breath sounds normal.  Abdominal: Soft. Bowel sounds are normal.  Genitourinary:       Minimal right flank tenderness  Musculoskeletal: Normal range of motion.  Neurological: He is alert and oriented to person, place, and time.  Skin: Skin  is warm and dry.  Psychiatric: He has a normal mood and affect.    ED Course  Procedures (including critical care time)  Labs Reviewed  URINALYSIS, ROUTINE W REFLEX MICROSCOPIC - Abnormal; Notable for the following:    Specific Gravity, Urine <1.005 (*)    All other components within normal limits  LAB REPORT - SCANNED   No results found.   1. Urethritis       MDM  Urinalysis is normal. Patient is nontoxic. No further testing necessary        Donnetta Hutching, MD 03/26/12 1758

## 2012-04-15 DIAGNOSIS — F411 Generalized anxiety disorder: Secondary | ICD-10-CM | POA: Diagnosis not present

## 2012-04-15 DIAGNOSIS — M545 Low back pain: Secondary | ICD-10-CM | POA: Diagnosis not present

## 2012-04-15 DIAGNOSIS — H6690 Otitis media, unspecified, unspecified ear: Secondary | ICD-10-CM | POA: Diagnosis not present

## 2012-04-21 ENCOUNTER — Encounter (HOSPITAL_COMMUNITY): Payer: Self-pay | Admitting: Emergency Medicine

## 2012-04-21 ENCOUNTER — Emergency Department (HOSPITAL_COMMUNITY)
Admission: EM | Admit: 2012-04-21 | Discharge: 2012-04-21 | Disposition: A | Discharge status: Home / Self Care | Payer: Medicare Other | Attending: Emergency Medicine | Admitting: Emergency Medicine

## 2012-04-21 ENCOUNTER — Emergency Department (HOSPITAL_COMMUNITY): Payer: Medicare Other

## 2012-04-21 DIAGNOSIS — R05 Cough: Secondary | ICD-10-CM | POA: Diagnosis not present

## 2012-04-21 DIAGNOSIS — F319 Bipolar disorder, unspecified: Secondary | ICD-10-CM | POA: Diagnosis not present

## 2012-04-21 DIAGNOSIS — J40 Bronchitis, not specified as acute or chronic: Secondary | ICD-10-CM | POA: Diagnosis not present

## 2012-04-21 DIAGNOSIS — I10 Essential (primary) hypertension: Secondary | ICD-10-CM | POA: Diagnosis not present

## 2012-04-21 DIAGNOSIS — R0989 Other specified symptoms and signs involving the circulatory and respiratory systems: Secondary | ICD-10-CM | POA: Diagnosis not present

## 2012-04-21 DIAGNOSIS — K219 Gastro-esophageal reflux disease without esophagitis: Secondary | ICD-10-CM | POA: Insufficient documentation

## 2012-04-21 DIAGNOSIS — E785 Hyperlipidemia, unspecified: Secondary | ICD-10-CM | POA: Diagnosis not present

## 2012-04-21 DIAGNOSIS — F17200 Nicotine dependence, unspecified, uncomplicated: Secondary | ICD-10-CM | POA: Insufficient documentation

## 2012-04-21 DIAGNOSIS — R059 Cough, unspecified: Secondary | ICD-10-CM | POA: Insufficient documentation

## 2012-04-21 DIAGNOSIS — F200 Paranoid schizophrenia: Secondary | ICD-10-CM | POA: Insufficient documentation

## 2012-04-21 DIAGNOSIS — J209 Acute bronchitis, unspecified: Secondary | ICD-10-CM | POA: Diagnosis not present

## 2012-04-21 MED ORDER — AZITHROMYCIN 250 MG PO TABS
500.0000 mg | ORAL_TABLET | Freq: Once | ORAL | Status: AC
  Administered 2012-04-21: 500 mg via ORAL
  Filled 2012-04-21: qty 2

## 2012-04-21 MED ORDER — ALBUTEROL SULFATE (5 MG/ML) 0.5% IN NEBU
5.0000 mg | INHALATION_SOLUTION | Freq: Once | RESPIRATORY_TRACT | Status: AC
  Administered 2012-04-21: 5 mg via RESPIRATORY_TRACT
  Filled 2012-04-21: qty 1

## 2012-04-21 MED ORDER — ALBUTEROL SULFATE HFA 108 (90 BASE) MCG/ACT IN AERS
1.0000 | INHALATION_SPRAY | Freq: Four times a day (QID) | RESPIRATORY_TRACT | Status: DC | PRN
Start: 2012-04-21 — End: 2017-12-24

## 2012-04-21 MED ORDER — AZITHROMYCIN 250 MG PO TABS: ORAL_TABLET | ORAL | Status: DC

## 2012-04-21 NOTE — Discharge Instructions (Signed)
Your xray shows an early bronchitis. Take all of the antibiotic. Use the inhaler 2 puffs 4 times a day for the next 4 days then as needed for wheezing.Follow up with your doctor.

## 2012-04-21 NOTE — ED Notes (Signed)
Pt present with a cough and congestion x2 days. States that he has a productive cough and he has been coughing up green mucus. Pt denies any SOB. Lung sounds clear in all quadrants. Pt is AAx4 NAD noted. Complains of night sweats also.

## 2012-04-22 NOTE — ED Provider Notes (Signed)
History     CSN: [id number]  Arrival date & time 04/21/12  0111   First MD Initiated Contact with Patient 04/21/12 0217      Chief Complaint  Patient presents with  . Cough    (Consider location/radiation/quality/duration/timing/severity/associated sxs/prior treatment) HPI [name] is a 36 y.o. male who presents to the Emergency Department complaining of cough and congestion x 2 days associated with production of green sputum. He also has had night sweats for the last two nights without fever during the day. He has taken no medicines.  PCP Dr. Juanetta Gosling   Past Medical History  Diagnosis Date  . Paranoid schizophrenia   . Bipolar 1 disorder   . Anxiety   . Hypertension   . Hyperlipidemia   . GERD (gastroesophageal reflux disease)   . Complication of anesthesia     Anxiety when he awaken with ET tube still in place    Past Surgical History  Procedure Date  . Cholecystectomy   . Ankle surgey   . Appendectomy   . Nasal sinus surgery 01/24/2012    Procedure: ENDOSCOPIC SINUS SURGERY;  Surgeon: Serena Colonel, MD;  Location: [facility] OR;  Service: ENT;  Laterality: Left;  left ethmoidectomy, maxillary, frontal    Family History  Problem Relation Age of Onset  . Anesthesia problems Mother     History  Substance Use Topics  . Smoking status: Current Everyday Smoker -- 1.0 packs/day for 22 years    Types: Cigarettes  . Smokeless tobacco: Never Used  . Alcohol Use: No      Review of Systems  Constitutional: Negative for fever.       10 Systems reviewed and are negative for acute change except as noted in the HPI.  HENT: Negative for congestion.   Eyes: Negative for discharge and redness.  Respiratory: Positive for cough and wheezing. Negative for shortness of breath.   Cardiovascular: Negative for chest pain.  Gastrointestinal: Negative for vomiting and abdominal pain.  Musculoskeletal: Negative for back pain.  Skin: Negative for rash.  Neurological: Negative  for syncope, numbness and headaches.  Psychiatric/Behavioral:       No behavior change.    Allergies  Compazine; Imitrex; Prochlorperazine edisylate; and Risperidone  Home Medications   Current Outpatient Rx  Name Route Sig Dispense Refill  . ALPRAZOLAM 1 MG PO TABS Oral Take 1 mg by mouth 4 (four) times daily as needed.    Marland Kitchen BENZTROPINE MESYLATE 0.5 MG PO TABS Oral Take 1 mg by mouth at bedtime.     Marland Kitchen CETIRIZINE HCL 10 MG PO TABS Oral Take 10 mg by mouth every morning.    . CHOLINE FENOFIBRATE 135 MG PO CPDR Oral Take 135 mg by mouth every morning.    Marland Kitchen DOXEPIN HCL 150 MG PO CAPS Oral Take 150 mg by mouth at bedtime.    . DULOXETINE HCL 60 MG PO CPEP Oral Take 60 mg by mouth every morning.     Marland Kitchen GABAPENTIN 800 MG PO TABS Oral Take 800 mg by mouth 4 (four) times daily.    Marland Kitchen HALOPERIDOL 10 MG PO TABS Oral Take 10 mg by mouth at bedtime.     Marland Kitchen HYDROCODONE-ACETAMINOPHEN 10-325 MG PO TABS Oral Take 1 tablet by mouth every 6 (six) hours as needed.    Marland Kitchen METOPROLOL TARTRATE 50 MG PO TABS Oral Take 50 mg by mouth 2 (two) times daily.      Carma Leaven M PLUS PO TABS Oral Take 1 tablet  by mouth every morning.     Marland Kitchen NIACIN ER (ANTIHYPERLIPIDEMIC) 500 MG PO TBCR Oral Take 500 mg by mouth at bedtime.      . OMEPRAZOLE 40 MG PO CPDR Oral Take 40 mg by mouth every morning.     Marland Kitchen SYSTANE OP Ophthalmic Apply 1 drop to eye 4 (four) times daily.      . QUETIAPINE FUMARATE 200 MG PO TABS Oral Take 800 mg by mouth at bedtime.     . TRAZODONE HCL 150 MG PO TABS Oral Take 300 mg by mouth at bedtime.     . TRIAMCINOLONE ACETONIDE 0.1 % EX CREA Topical Apply 1 application topically 3 (three) times daily.    . ACETAMINOPHEN 500 MG PO TABS Oral Take 1,000 mg by mouth every 6 (six) hours as needed. For pain    . ALBUTEROL SULFATE HFA 108 (90 BASE) MCG/ACT IN AERS Inhalation Inhale 1-2 puffs into the lungs every 6 (six) hours as needed for wheezing. 1 Inhaler 0  . AZITHROMYCIN 250 MG PO TABS  1 every day until  finished. 4 tablet 0  . HALOPERIDOL DECANOATE 100 MG/ML IM SOLN Intramuscular Inject 150 mg into the muscle every 28 (twenty-eight) days.    . MELOXICAM 15 MG PO TABS Oral Take 15 mg by mouth every morning.       BP 122/61  Pulse 88  Temp 97.7 F (36.5 C) (Oral)  Ht 6\' 2"  (1.88 m)  Wt 310 lb (140.615 kg)  BMI 39.80 kg/m2  SpO2 98%  Physical Exam  Nursing note and vitals reviewed. Constitutional: He appears well-developed and well-nourished.       Awake, alert, nontoxic appearance.  HENT:  Head: Atraumatic.  Eyes: Right eye exhibits no discharge. Left eye exhibits no discharge.  Neck: Neck supple.  Cardiovascular: Normal rate, normal heart sounds and intact distal pulses.   Pulmonary/Chest: Effort normal. He has wheezes. He exhibits no tenderness.       coughing  Abdominal: Soft. There is no tenderness. There is no rebound.  Musculoskeletal: He exhibits no tenderness.       Baseline ROM, no obvious new focal weakness.  Neurological:       Mental status and motor strength appears baseline for patient and situation.  Skin: No rash noted.  Psychiatric: He has a normal mood and affect.    ED Course  Procedures (including critical care time)  Labs Reviewed - No data to display Dg Chest 2 View  04/21/2012  *RADIOLOGY REPORT*  Clinical Data: Cough, congestion  CHEST - 2 VIEW  Comparison: 12/22/2011  Findings: Heart size upper normal.  Mild central vascular fullness. Mild interstitial prominence, may be accentuated by overlying soft tissues.  No focal consolidation.  No pleural effusion or pneumothorax.  No acute osseous finding.  IMPRESSION: Mild interstitial prominence without focal consolidation.  Original Report Authenticated By: Waneta Martins, M.D.     1. Bronchitis       MDM  Patient with productive cough and congestion x 2 days. Xray with bronchitic findings. Given nebulizer treatment with improvement in wheezing. Initiated antibiotic therapy. Pt feels improved  after observation and/or treatment in ED.Pt stable in ED with no significant deterioration in condition.The patient appears reasonably screened and/or stabilized for discharge and I doubt any other medical condition or other [facility] requiring further screening, evaluation, or treatment in the ED at this time prior to discharge.  MDM Reviewed: nursing note and vitals Interpretation: x-ray  Nicoletta Dress. Colon Branch, MD 04/22/12 2113

## 2012-04-23 ENCOUNTER — Encounter (HOSPITAL_COMMUNITY): Payer: Self-pay | Admitting: Emergency Medicine

## 2012-04-23 ENCOUNTER — Emergency Department (HOSPITAL_COMMUNITY)
Admission: EM | Admit: 2012-04-23 | Discharge: 2012-04-23 | Disposition: A | Discharge status: Home / Self Care | Payer: Medicare Other | Attending: Emergency Medicine | Admitting: Emergency Medicine

## 2012-04-23 DIAGNOSIS — I10 Essential (primary) hypertension: Secondary | ICD-10-CM | POA: Insufficient documentation

## 2012-04-23 DIAGNOSIS — R0609 Other forms of dyspnea: Secondary | ICD-10-CM | POA: Insufficient documentation

## 2012-04-23 DIAGNOSIS — F17200 Nicotine dependence, unspecified, uncomplicated: Secondary | ICD-10-CM | POA: Diagnosis not present

## 2012-04-23 DIAGNOSIS — R05 Cough: Secondary | ICD-10-CM | POA: Insufficient documentation

## 2012-04-23 DIAGNOSIS — R059 Cough, unspecified: Secondary | ICD-10-CM | POA: Diagnosis not present

## 2012-04-23 DIAGNOSIS — R0989 Other specified symptoms and signs involving the circulatory and respiratory systems: Secondary | ICD-10-CM | POA: Insufficient documentation

## 2012-04-23 MED ORDER — PREDNISONE 20 MG PO TABS
60.0000 mg | ORAL_TABLET | Freq: Once | ORAL | Status: AC
  Administered 2012-04-23: 60 mg via ORAL
  Filled 2012-04-23: qty 3

## 2012-04-23 MED ORDER — GUAIFENESIN-CODEINE 100-10 MG/5ML PO SOLN
5.0000 mL | Freq: Once | ORAL | Status: AC
  Administered 2012-04-23: 5 mL via ORAL
  Filled 2012-04-23: qty 5

## 2012-04-23 MED ORDER — PREDNISONE 10 MG PO TABS: 20.0000 mg | ORAL_TABLET | Freq: Every day | ORAL | Status: DC

## 2012-04-23 MED ORDER — ALBUTEROL SULFATE (5 MG/ML) 0.5% IN NEBU: 2.5000 mg | INHALATION_SOLUTION | Freq: Once | RESPIRATORY_TRACT | Status: DC

## 2012-04-23 MED ORDER — ALBUTEROL SULFATE (5 MG/ML) 0.5% IN NEBU
5.0000 mg | INHALATION_SOLUTION | Freq: Once | RESPIRATORY_TRACT | Status: AC
  Administered 2012-04-23: 5 mg via RESPIRATORY_TRACT
  Filled 2012-04-23: qty 1

## 2012-04-23 NOTE — Discharge Instructions (Signed)
Finish your antibiotic. Take all of the prednisone. Get the cough medicine as use it as directed. Follow up with Dr. Juanetta Gosling.

## 2012-04-23 NOTE — ED Notes (Signed)
Pt states he feels better & is able to cough up stuff after breathing tx.

## 2012-04-23 NOTE — ED Provider Notes (Signed)
History     CSN: [id number]  Arrival date & time 04/23/12  0327   First MD Initiated Contact with Patient 04/23/12 0435      Chief Complaint  Patient presents with  . Respiratory Distress    (Consider location/radiation/quality/duration/timing/severity/associated sxs/prior treatment) HPI [name] is a 36 y.o. male who presents to the Emergency Department complaining of continued cough since 04/21/2012 when seen in the ER and diagnosed with bronchitis. He has been unable to get his inhaler. Has been taking his antibiotic.   PCP Dr. Juanetta Gosling   Past Medical History  Diagnosis Date  . Paranoid schizophrenia   . Bipolar 1 disorder   . Anxiety   . Hypertension   . Hyperlipidemia   . GERD (gastroesophageal reflux disease)   . Complication of anesthesia     Anxiety when he awaken with ET tube still in place    Past Surgical History  Procedure Date  . Cholecystectomy   . Ankle surgey   . Appendectomy   . Nasal sinus surgery 01/24/2012    Procedure: ENDOSCOPIC SINUS SURGERY;  Surgeon: Serena Colonel, MD;  Location: [facility] OR;  Service: ENT;  Laterality: Left;  left ethmoidectomy, maxillary, frontal    Family History  Problem Relation Age of Onset  . Anesthesia problems Mother     History  Substance Use Topics  . Smoking status: Current Everyday Smoker -- 1.0 packs/day for 22 years    Types: Cigarettes  . Smokeless tobacco: Never Used  . Alcohol Use: No      Review of Systems  Constitutional: Negative for fever.       10 Systems reviewed and are negative for acute change except as noted in the HPI.  HENT: Negative for congestion.   Eyes: Negative for discharge and redness.  Respiratory: Positive for cough. Negative for shortness of breath.   Cardiovascular: Negative for chest pain.  Gastrointestinal: Negative for vomiting and abdominal pain.  Musculoskeletal: Negative for back pain.  Skin: Negative for rash.  Neurological: Negative for syncope, numbness and  headaches.  Psychiatric/Behavioral:       No behavior change.    Allergies  Bee venom; Compazine; Imitrex; Prochlorperazine edisylate; and Risperidone  Home Medications   Current Outpatient Rx  Name Route Sig Dispense Refill  . ACETAMINOPHEN 500 MG PO TABS Oral Take 1,000 mg by mouth every 6 (six) hours as needed. For pain    . ALBUTEROL SULFATE HFA 108 (90 BASE) MCG/ACT IN AERS Inhalation Inhale 1-2 puffs into the lungs every 6 (six) hours as needed for wheezing. 1 Inhaler 0  . ALPRAZOLAM 1 MG PO TABS Oral Take 1 mg by mouth 4 (four) times daily as needed.    . AZITHROMYCIN 250 MG PO TABS  1 every day until finished. 4 tablet 0  . BENZTROPINE MESYLATE 0.5 MG PO TABS Oral Take 1 mg by mouth at bedtime.     Marland Kitchen CETIRIZINE HCL 10 MG PO TABS Oral Take 10 mg by mouth every morning.    . CHOLINE FENOFIBRATE 135 MG PO CPDR Oral Take 135 mg by mouth every morning.    Marland Kitchen DOXEPIN HCL 150 MG PO CAPS Oral Take 150 mg by mouth at bedtime.    . DULOXETINE HCL 60 MG PO CPEP Oral Take 60 mg by mouth every morning.     Marland Kitchen GABAPENTIN 800 MG PO TABS Oral Take 800 mg by mouth 4 (four) times daily.    Marland Kitchen HALOPERIDOL 10 MG PO TABS Oral Take  10 mg by mouth at bedtime.     Marland Kitchen HALOPERIDOL DECANOATE 100 MG/ML IM SOLN Intramuscular Inject 150 mg into the muscle every 28 (twenty-eight) days.    Marland Kitchen HYDROCODONE-ACETAMINOPHEN 10-325 MG PO TABS Oral Take 1 tablet by mouth every 6 (six) hours as needed.    . MELOXICAM 15 MG PO TABS Oral Take 15 mg by mouth every morning.     Marland Kitchen METOPROLOL TARTRATE 50 MG PO TABS Oral Take 50 mg by mouth 2 (two) times daily.      Carma Leaven M PLUS PO TABS Oral Take 1 tablet by mouth every morning.     Marland Kitchen NIACIN ER (ANTIHYPERLIPIDEMIC) 500 MG PO TBCR Oral Take 500 mg by mouth at bedtime.      . OMEPRAZOLE 40 MG PO CPDR Oral Take 40 mg by mouth every morning.     Marland Kitchen SYSTANE OP Ophthalmic Apply 1 drop to eye 4 (four) times daily.      . QUETIAPINE FUMARATE 200 MG PO TABS Oral Take 800 mg by mouth at  bedtime.     . TRAZODONE HCL 150 MG PO TABS Oral Take 300 mg by mouth at bedtime.     . TRIAMCINOLONE ACETONIDE 0.1 % EX CREA Topical Apply 1 application topically 3 (three) times daily.      BP 140/62  Pulse 107  Temp 98.9 F (37.2 C) (Oral)  Resp 20  Ht 6\' 2"  (1.88 m)  Wt 290 lb (131.543 kg)  BMI 37.23 kg/m2  SpO2 99%  Physical Exam  Nursing note and vitals reviewed. Constitutional:       Awake, alert, nontoxic appearance.  HENT:  Head: Atraumatic.  Eyes: Right eye exhibits no discharge. Left eye exhibits no discharge.  Neck: Neck supple.  Cardiovascular: Regular rhythm and normal heart sounds.   Pulmonary/Chest: Effort normal and breath sounds normal. He exhibits no tenderness.       coughing  Abdominal: Soft. There is no tenderness. There is no rebound.  Musculoskeletal: He exhibits no tenderness.       Baseline ROM, no obvious new focal weakness.  Neurological:       Mental status and motor strength appears baseline for patient and situation.  Skin: No rash noted.  Psychiatric: He has a normal mood and affect.    ED Course  Procedures (including critical care time)    MDM  Patient with persistent cough despite antibiotics for treatment of a bronchitis. Unable to get an inhaler. Given nebulizer treatment with improvement in the cough. Initiated steroid therapy. Pt stable in ED with no significant deterioration in condition.The patient appears reasonably screened and/or stabilized for discharge and I doubt any other medical condition or other [facility] requiring further screening, evaluation, or treatment in the ED at this time prior to discharge.  MDM Reviewed: nursing note, vitals and previous chart Reviewed previous: x-ray           Nicoletta Dress. Colon Branch, MD 04/23/12 [phone]

## 2012-04-23 NOTE — ED Notes (Signed)
Patient complaining of shortness of breath. Was seen here on 04/21/12 but unable to get inhaler due to financial reasons. States "I can't hardly catch my breath."

## 2012-04-23 NOTE — ED Notes (Signed)
Pt reports increase in SOB was seen in the er yesterday & was unable to get inhaler script filled.

## 2012-06-04 DIAGNOSIS — M545 Low back pain, unspecified: Secondary | ICD-10-CM | POA: Diagnosis not present

## 2012-06-04 DIAGNOSIS — H6690 Otitis media, unspecified, unspecified ear: Secondary | ICD-10-CM | POA: Diagnosis not present

## 2012-06-04 DIAGNOSIS — E785 Hyperlipidemia, unspecified: Secondary | ICD-10-CM | POA: Diagnosis not present

## 2012-06-04 DIAGNOSIS — F411 Generalized anxiety disorder: Secondary | ICD-10-CM | POA: Diagnosis not present

## 2012-06-04 DIAGNOSIS — I10 Essential (primary) hypertension: Secondary | ICD-10-CM | POA: Diagnosis not present

## 2012-07-29 DIAGNOSIS — E785 Hyperlipidemia, unspecified: Secondary | ICD-10-CM | POA: Diagnosis not present

## 2012-07-29 DIAGNOSIS — F411 Generalized anxiety disorder: Secondary | ICD-10-CM | POA: Diagnosis not present

## 2012-07-29 DIAGNOSIS — I10 Essential (primary) hypertension: Secondary | ICD-10-CM | POA: Diagnosis not present

## 2012-07-29 DIAGNOSIS — M545 Low back pain: Secondary | ICD-10-CM | POA: Diagnosis not present

## 2012-08-06 ENCOUNTER — Encounter (HOSPITAL_COMMUNITY): Payer: Self-pay | Admitting: Emergency Medicine

## 2012-08-06 ENCOUNTER — Emergency Department (HOSPITAL_COMMUNITY)
Admission: EM | Admit: 2012-08-06 | Discharge: 2012-08-06 | Disposition: A | Discharge status: Home / Self Care | Payer: Medicare Other | Attending: Emergency Medicine | Admitting: Emergency Medicine

## 2012-08-06 ENCOUNTER — Emergency Department (HOSPITAL_COMMUNITY): Payer: Medicare Other

## 2012-08-06 DIAGNOSIS — IMO0002 Reserved for concepts with insufficient information to code with codable children: Secondary | ICD-10-CM | POA: Insufficient documentation

## 2012-08-06 DIAGNOSIS — Z91038 Other insect allergy status: Secondary | ICD-10-CM | POA: Diagnosis not present

## 2012-08-06 DIAGNOSIS — M5416 Radiculopathy, lumbar region: Secondary | ICD-10-CM

## 2012-08-06 DIAGNOSIS — F319 Bipolar disorder, unspecified: Secondary | ICD-10-CM | POA: Insufficient documentation

## 2012-08-06 DIAGNOSIS — F200 Paranoid schizophrenia: Secondary | ICD-10-CM | POA: Insufficient documentation

## 2012-08-06 DIAGNOSIS — K219 Gastro-esophageal reflux disease without esophagitis: Secondary | ICD-10-CM | POA: Diagnosis not present

## 2012-08-06 DIAGNOSIS — Z79899 Other long term (current) drug therapy: Secondary | ICD-10-CM | POA: Diagnosis not present

## 2012-08-06 DIAGNOSIS — F411 Generalized anxiety disorder: Secondary | ICD-10-CM | POA: Diagnosis not present

## 2012-08-06 DIAGNOSIS — Z888 Allergy status to other drugs, medicaments and biological substances status: Secondary | ICD-10-CM | POA: Diagnosis not present

## 2012-08-06 DIAGNOSIS — M545 Low back pain, unspecified: Secondary | ICD-10-CM | POA: Insufficient documentation

## 2012-08-06 DIAGNOSIS — I10 Essential (primary) hypertension: Secondary | ICD-10-CM | POA: Diagnosis not present

## 2012-08-06 DIAGNOSIS — F17200 Nicotine dependence, unspecified, uncomplicated: Secondary | ICD-10-CM | POA: Insufficient documentation

## 2012-08-06 DIAGNOSIS — E785 Hyperlipidemia, unspecified: Secondary | ICD-10-CM | POA: Insufficient documentation

## 2012-08-06 MED ORDER — HYDROCODONE-ACETAMINOPHEN 5-325 MG PO TABS: 1.0000 | ORAL_TABLET | Freq: Four times a day (QID) | ORAL | Status: AC | PRN

## 2012-08-06 MED ORDER — PREDNISONE 50 MG PO TABS: 50.0000 mg | ORAL_TABLET | Freq: Every day | ORAL | Status: DC

## 2012-08-06 NOTE — ED Notes (Signed)
Patient with no complaints at this time. Respirations even and unlabored. Skin warm/dry. Discharge instructions reviewed with patient at this time. Patient given opportunity to voice concerns/ask questions. Patient discharged at this time and left Emergency Department with steady gait.   

## 2012-08-06 NOTE — ED Notes (Signed)
Pt c/o lower back pain that radiates down his rt leg x one day.

## 2012-08-06 NOTE — ED Provider Notes (Signed)
History     CSN: [id number]  Arrival date & time 08/06/12  1211   None     Chief Complaint  Patient presents with  . Back Pain    (Consider location/radiation/quality/duration/timing/severity/associated sxs/prior treatment) HPI Comments: No known injury   Patient is a 36 y.o. male presenting with back pain. The history is provided by the patient.  Back Pain  This is a new problem. Episode onset: several days ago. The problem occurs constantly. The problem has not changed since onset.The pain is associated with no known injury. The pain is present in the lumbar spine. The quality of the pain is described as burning. The pain radiates to the right knee (R calf). The pain is the same all the time. Associated symptoms include leg pain and paresthesias. Pertinent negatives include no fever, no numbness, no bowel incontinence, no perianal numbness, no bladder incontinence, no paresis, no tingling and no weakness. He has tried NSAIDs for the symptoms. The treatment provided no relief.    Past Medical History  Diagnosis Date  . Paranoid schizophrenia   . Bipolar 1 disorder   . Anxiety   . Hypertension   . Hyperlipidemia   . GERD (gastroesophageal reflux disease)   . Complication of anesthesia     Anxiety when he awaken with ET tube still in place    Past Surgical History  Procedure Date  . Cholecystectomy   . Ankle surgey   . Appendectomy   . Nasal sinus surgery 01/24/2012    Procedure: ENDOSCOPIC SINUS SURGERY;  Surgeon: Serena Colonel, MD;  Location: [facility] OR;  Service: ENT;  Laterality: Left;  left ethmoidectomy, maxillary, frontal    Family History  Problem Relation Age of Onset  . Anesthesia problems Mother     History  Substance Use Topics  . Smoking status: Current Every Day Smoker -- 1.0 packs/day for 22 years    Types: Cigarettes  . Smokeless tobacco: Never Used  . Alcohol Use: No      Review of Systems  Constitutional: Negative for fever and chills.    Gastrointestinal: Negative for bowel incontinence.  Genitourinary: Negative for bladder incontinence.  Musculoskeletal: Positive for back pain.  Neurological: Positive for paresthesias. Negative for tingling, weakness and numbness.  All other systems reviewed and are negative.    Allergies  Bee venom; Compazine; Imitrex; Prochlorperazine edisylate; and Risperidone  Home Medications   Current Outpatient Rx  Name Route Sig Dispense Refill  . BENZTROPINE MESYLATE 0.5 MG PO TABS Oral Take 1 mg by mouth at bedtime.     Marland Kitchen CETIRIZINE HCL 10 MG PO TABS Oral Take 10 mg by mouth every morning.    . CHOLINE FENOFIBRATE 135 MG PO CPDR Oral Take 135 mg by mouth every morning.    Marland Kitchen CITALOPRAM HYDROBROMIDE 20 MG PO TABS Oral Take 20 mg by mouth every morning.    Marland Kitchen DIAZEPAM 5 MG PO TABS Oral Take 5 mg by mouth 3 (three) times daily as needed. For anxiety    . DOXEPIN HCL 150 MG PO CAPS Oral Take 150 mg by mouth at bedtime.    Marland Kitchen GABAPENTIN 400 MG PO CAPS Oral Take 400 mg by mouth 2 (two) times daily.    Marland Kitchen GABAPENTIN 800 MG PO TABS Oral Take 800 mg by mouth at bedtime.     Marland Kitchen METOPROLOL TARTRATE 50 MG PO TABS Oral Take 50 mg by mouth 2 (two) times daily.      Marland Kitchen NIACIN ER (ANTIHYPERLIPIDEMIC) 500 MG  PO TBCR Oral Take 500 mg by mouth at bedtime.      . OMEPRAZOLE 40 MG PO CPDR Oral Take 40 mg by mouth every morning.     Marland Kitchen QUETIAPINE FUMARATE 400 MG PO TABS Oral Take 800 mg by mouth at bedtime.    . TRAZODONE HCL 150 MG PO TABS Oral Take 150 mg by mouth at bedtime.     . ALBUTEROL SULFATE HFA 108 (90 BASE) MCG/ACT IN AERS Inhalation Inhale 1-2 puffs into the lungs every 6 (six) hours as needed for wheezing. 1 Inhaler 0  . HALOPERIDOL DECANOATE 100 MG/ML IM SOLN Intramuscular Inject 150 mg into the muscle every 28 (twenty-eight) days.    Marland Kitchen HYDROCODONE-ACETAMINOPHEN 5-325 MG PO TABS Oral Take 1 tablet by mouth every 6 (six) hours as needed for pain. 20 tablet 0  . PREDNISONE 50 MG PO TABS Oral Take 1 tablet  (50 mg total) by mouth daily. 6 tablet 0    BP 111/67  Pulse 87  Temp 97.8 F (36.6 C) (Oral)  Resp 20  Ht 6\' 2"  (1.88 m)  Wt 340 lb (154.223 kg)  BMI 43.65 kg/m2  SpO2 96%  Physical Exam  Nursing note and vitals reviewed. Constitutional: He is oriented to person, place, and time. He appears well-developed and well-nourished.  HENT:  Head: Normocephalic and atraumatic.  Eyes: EOM are normal.  Neck: Normal range of motion.  Cardiovascular: Normal rate, regular rhythm, normal heart sounds and intact distal pulses.   Pulmonary/Chest: Effort normal and breath sounds normal. No respiratory distress.  Abdominal: Soft. He exhibits no distension. There is no tenderness.  Musculoskeletal: Normal range of motion.       Back:  Neurological: He is alert and oriented to person, place, and time.  Skin: Skin is warm and dry.  Psychiatric: He has a normal mood and affect. Judgment normal.    ED Course  Procedures (including critical care time)  Labs Reviewed - No data to display Dg Lumbar Spine Complete  08/06/2012  *RADIOLOGY REPORT*  Clinical Data: Low back pain for 5 days into right leg  LUMBAR SPINE - COMPLETE 4+ VIEW  Comparison: 11/18/2009  Findings: Five non-rib bearing lumbar vertebrae. Vertebral body and disc space heights maintained. No acute fracture, subluxation or bone destruction. No spondylolysis. SI joints symmetric.  IMPRESSION: No acute lumbar spine abnormalities.   Original Report Authenticated By: Lollie Marrow, M.D.      1. Lumbar back pain   2. Lumbar radiculopathy, acute       MDM  rx-hydrocodone rx-prednisone 50 mg, 6 Ice F/u with PCP        Evalina Field, PA 08/06/12 1527

## 2012-08-08 NOTE — ED Provider Notes (Signed)
Medical screening examination/treatment/procedure(s) were performed by non-physician practitioner and as supervising physician I was immediately available for consultation/collaboration.   Carleene Cooper III, MD 08/08/12 [phone]

## 2012-08-25 DIAGNOSIS — M5137 Other intervertebral disc degeneration, lumbosacral region: Secondary | ICD-10-CM | POA: Diagnosis not present

## 2012-08-25 DIAGNOSIS — M545 Low back pain: Secondary | ICD-10-CM | POA: Diagnosis not present

## 2012-08-25 DIAGNOSIS — IMO0002 Reserved for concepts with insufficient information to code with codable children: Secondary | ICD-10-CM | POA: Diagnosis not present

## 2012-08-25 DIAGNOSIS — G894 Chronic pain syndrome: Secondary | ICD-10-CM | POA: Diagnosis not present

## 2012-09-01 ENCOUNTER — Ambulatory Visit: Payer: Medicare Other | Attending: Pulmonary Disease | Admitting: Sleep Medicine

## 2012-09-01 ENCOUNTER — Encounter: Payer: Self-pay | Admitting: Sleep Medicine

## 2012-09-01 VITALS — Ht 74.0 in | Wt 320.0 lb

## 2012-09-01 DIAGNOSIS — Z6841 Body Mass Index (BMI) 40.0 and over, adult: Secondary | ICD-10-CM | POA: Diagnosis not present

## 2012-09-01 DIAGNOSIS — R0609 Other forms of dyspnea: Secondary | ICD-10-CM | POA: Diagnosis not present

## 2012-09-01 DIAGNOSIS — G4700 Insomnia, unspecified: Secondary | ICD-10-CM | POA: Diagnosis not present

## 2012-09-01 DIAGNOSIS — G4730 Sleep apnea, unspecified: Secondary | ICD-10-CM | POA: Insufficient documentation

## 2012-09-01 DIAGNOSIS — G4710 Hypersomnia, unspecified: Secondary | ICD-10-CM | POA: Diagnosis not present

## 2012-09-01 DIAGNOSIS — R0989 Other specified symptoms and signs involving the circulatory and respiratory systems: Secondary | ICD-10-CM | POA: Insufficient documentation

## 2012-09-02 DIAGNOSIS — F411 Generalized anxiety disorder: Secondary | ICD-10-CM | POA: Diagnosis not present

## 2012-09-02 DIAGNOSIS — I10 Essential (primary) hypertension: Secondary | ICD-10-CM | POA: Diagnosis not present

## 2012-09-02 DIAGNOSIS — M545 Low back pain, unspecified: Secondary | ICD-10-CM | POA: Diagnosis not present

## 2012-09-02 DIAGNOSIS — Z23 Encounter for immunization: Secondary | ICD-10-CM | POA: Diagnosis not present

## 2012-09-02 NOTE — Progress Notes (Signed)
Split night study ordered, however AHI <15 ini 1st 2 hrs TST - therefore criteria not met.

## 2012-09-06 NOTE — Procedures (Signed)
[facility] Suriyah Vergara A. Gerilyn Pilgrim, MD     [email or web address]       NAME:  [name], [name]           ACCOUNT NO.:  [id number]  MEDICAL RECORD NO.:  [id number]          PATIENT TYPE:  OUT  LOCATION:  SLEEP LAB                     FACILITY:  [facility]  PHYSICIAN:  Daily Crate A. Gerilyn Pilgrim, M.D. DATE OF BIRTH:  [birth year]                            NOCTURNAL POLYSOMNOGRAM  REFERRING PHYSICIAN:  Ramon Dredge L. Juanetta Gosling, M.D.  INDICATION:  A 36 year old, who presents with snoring, disruptive, difficulty falling asleep, and dyspnea.  MEDICATIONS:  Doxepin, Seroquel, trazodone, metoprolol, Trilipix, Zyrtec, naproxen, Prilosec, Pravachol, Valium, Percocet, and Cogentin.  EPWORTH SLEEPINESS SCALE:  7.  BMI 41.  ARCHITECTURAL SUMMARY:  The total recording time is 477 minutes.  Sleep efficiency 93%.  Sleep latency 7 minutes.  REM latency 149 minutes. Stage N1 2%, N2 75% N3 1% and REM sleep 22%.  RESPIRATORY SUMMARY:  Baseline oxygen saturation is 92, lowest saturation 84 during non-REM sleep.  Diagnostic AHI is 1 and RDI also 1. The patient had extensive periods of low saturation without obstructive events.  The patient has spent 24 minutes of the recording sleep time with saturation less than 88%.  LIMB MOVEMENT SUMMARY:  PLM index 0.  ELECTROCARDIOGRAM SUMMARY:  Average heart rate is 55 with no significant dysrhythmias observed.  IMPRESSION: 1. Hypoventilation syndrome. 2. Abnormal sleep architecture with increased stage II sleep, and     little slow wave sleep/deep sleep. 3. Primary snoring.  RECOMMENDATION:  2 L on nasal oxygen nocturnally.  Thanks for this referral.    Tanajah Boulter A. Gerilyn Pilgrim, M.D.    KAD/MEDQ  D:  09/05/2012 13:44:51  T:  09/06/2012 02:38:06  Job:  [id number]

## 2012-09-08 DIAGNOSIS — IMO0002 Reserved for concepts with insufficient information to code with codable children: Secondary | ICD-10-CM | POA: Diagnosis not present

## 2012-09-08 DIAGNOSIS — G894 Chronic pain syndrome: Secondary | ICD-10-CM | POA: Diagnosis not present

## 2012-09-08 DIAGNOSIS — M5137 Other intervertebral disc degeneration, lumbosacral region: Secondary | ICD-10-CM | POA: Diagnosis not present

## 2012-10-19 DIAGNOSIS — I10 Essential (primary) hypertension: Secondary | ICD-10-CM | POA: Diagnosis not present

## 2012-10-19 DIAGNOSIS — R112 Nausea with vomiting, unspecified: Secondary | ICD-10-CM | POA: Diagnosis not present

## 2012-10-19 DIAGNOSIS — F411 Generalized anxiety disorder: Secondary | ICD-10-CM | POA: Diagnosis not present

## 2012-10-19 DIAGNOSIS — M545 Low back pain: Secondary | ICD-10-CM | POA: Diagnosis not present

## 2012-11-17 ENCOUNTER — Encounter: Payer: Self-pay | Admitting: Internal Medicine

## 2012-11-18 ENCOUNTER — Ambulatory Visit (INDEPENDENT_AMBULATORY_CARE_PROVIDER_SITE_OTHER): Payer: Medicare Other | Admitting: Gastroenterology

## 2012-11-18 ENCOUNTER — Encounter: Payer: Self-pay | Admitting: Gastroenterology

## 2012-11-18 VITALS — BP 103/65 | HR 75 | Temp 97.4°F | Ht 74.0 in | Wt 348.2 lb

## 2012-11-18 DIAGNOSIS — R109 Unspecified abdominal pain: Secondary | ICD-10-CM

## 2012-11-18 MED ORDER — DEXLANSOPRAZOLE 60 MG PO CPDR: 60.0000 mg | DELAYED_RELEASE_CAPSULE | Freq: Every day | ORAL | Status: DC

## 2012-11-18 NOTE — Progress Notes (Signed)
Primary Care Physician:  Fredirick Maudlin, MD Primary Gastroenterologist:  Dr. Jena Gauss   Chief Complaint  Patient presents with  . Nausea  . Emesis  . Abdominal Pain    HPI:   37 year old pleasant male, presenting today at request of Dr. Juanetta Gosling secondary to abdominal pain,nausea, vomiting. Last EGD May 2009 with RMR: erosive esophagitis. Notes symptoms X 2-3 months of N/V, stomach cramping X 6-7 months. Eats, gets nauseated, then vomits. Notes several times during the day, stomach cramps (balls fist up). Lasts 20-30 minutes then goes away. No hematemesis. On Prilosec, doesn't really work per pt. Has tried Nexium, causes nausea. Tried Protonix. No melena. No hematochezia. No constipation or diarrhea.   No dysphagia.   Past Medical History  Diagnosis Date  . Schizoaffective disorder     Day Loraine Leriche  . Bipolar 1 disorder   . Anxiety   . Hypertension   . Hyperlipidemia   . GERD (gastroesophageal reflux disease)   . Complication of anesthesia     Anxiety when he awaken with ET tube still in place  . Hypoventilation syndrome     Past Surgical History  Procedure Date  . Cholecystectomy   . Ankle surgery   . Appendectomy   . Nasal sinus surgery 01/24/2012    Procedure: ENDOSCOPIC SINUS SURGERY;  Surgeon: Serena Colonel, MD;  Location: [facility] OR;  Service: ENT;  Laterality: Left;  left ethmoidectomy, maxillary, frontal  . Esophagogastroduodenoscopy 03/17/2008    RMR: A tiny distal esophageal erosions consistent with mild  erosive reflux esophagitis, otherwise normal esophagus, small hiatal hernia, minimally polypoid antral mucosa with doubtful clinical was significance.  Otherwise, normal stomach, patent pylorus, and normal D1-D2  . Umbilical hernia repair     Current Outpatient Prescriptions  Medication Sig Dispense Refill  . benztropine (COGENTIN) 0.5 MG tablet Take 1 mg by mouth at bedtime.       . cetirizine (ZYRTEC) 10 MG tablet Take 10 mg by mouth every morning.      . Choline  Fenofibrate 135 MG capsule Take 135 mg by mouth every morning.      . citalopram (CELEXA) 20 MG tablet Take 20 mg by mouth every morning.      . diazepam (VALIUM) 5 MG tablet Take 5 mg by mouth 3 (three) times daily as needed. For anxiety      . doxepin (SINEQUAN) 150 MG capsule Take 150 mg by mouth at bedtime.      . gabapentin (NEURONTIN) 400 MG capsule Take 400 mg by mouth 2 (two) times daily.      Marland Kitchen gabapentin (NEURONTIN) 800 MG tablet Take 800 mg by mouth at bedtime.       . haloperidol decanoate (HALDOL DECANOATE) 100 MG/ML injection Inject 150 mg into the muscle every 28 (twenty-eight) days.      Marland Kitchen HYDROcodone-acetaminophen (NORCO) 10-325 MG per tablet Take 1 tablet by mouth every 6 (six) hours as needed.      . metoprolol (LOPRESSOR) 50 MG tablet Take 50 mg by mouth 2 (two) times daily.        . niacin (NIASPAN) 500 MG CR tablet Take 500 mg by mouth at bedtime.        Marland Kitchen omeprazole (PRILOSEC) 40 MG capsule Take 40 mg by mouth every morning.       Marland Kitchen QUEtiapine (SEROQUEL) 400 MG tablet Take 800 mg by mouth at bedtime.      Marland Kitchen tiZANidine (ZANAFLEX) 4 MG capsule Take 4 mg by mouth 3 (three)  times daily.      . traZODone (DESYREL) 150 MG tablet Take 150 mg by mouth at bedtime.       Marland Kitchen albuterol (PROVENTIL HFA;VENTOLIN HFA) 108 (90 BASE) MCG/ACT inhaler Inhale 1-2 puffs into the lungs every 6 (six) hours as needed for wheezing.  1 Inhaler  0  . dexlansoprazole (DEXILANT) 60 MG capsule Take 1 capsule (60 mg total) by mouth daily.  30 capsule  3  . predniSONE (DELTASONE) 50 MG tablet Take 1 tablet (50 mg total) by mouth daily.  6 tablet  0    Allergies as of 11/18/2012 - Review Complete 11/18/2012  Allergen Reaction Noted  . Bee venom  04/23/2012  . Compazine  11/01/2011  . Imitrex (sumatriptan base) Nausea And Vomiting 10/18/2011  . Prochlorperazine edisylate  01/07/2008  . Risperidone  01/07/2008    Family History  Problem Relation Age of Onset  . Anesthesia problems Mother   . Colon  cancer Maternal Grandfather   . Crohn's disease Maternal Grandfather     History   Social History  . Marital Status: Divorced    Spouse Name: N/A    Number of Children: N/A  . Years of Education: N/A   Occupational History  . Not on file.   Social History Main Topics  . Smoking status: Current Every Day Smoker -- 1.0 packs/day for 22 years    Types: Cigarettes  . Smokeless tobacco: Never Used  . Alcohol Use: No  . Drug Use: No  . Sexually Active:    Other Topics Concern  . Not on file   Social History Narrative  . No narrative on file    Review of Systems: Gen: Denies any fever, chills, fatigue, weight loss, lack of appetite.  CV: Denies chest pain, heart palpitations, peripheral edema, syncope.  Resp: oxygen at night  GI: SEE HPI GU : Denies urinary burning, urinary frequency, urinary hesitancy MS: +lower back pain, sciatic pain Derm: Denies rash, itching, dry skin Psych: +depression/anxiety Heme: Denies bruising, bleeding, and enlarged lymph nodes.  Physical Exam: BP 103/65  Pulse 75  Temp 97.4 F (36.3 C) (Oral)  Ht 6\' 2"  (1.88 m)  Wt 348 lb 3.2 oz (157.942 kg)  BMI 44.71 kg/m2 General:   Alert and oriented. Pleasant and cooperative. Well-nourished and well-developed.  Head:  Normocephalic and atraumatic. Eyes:  Without icterus, sclera clear and conjunctiva pink.  Ears:  Normal auditory acuity. Nose:  No deformity, discharge,  or lesions. Mouth:  No deformity or lesions, oral mucosa pink.  Neck:  Supple, without mass or thyromegaly. Lungs:  Clear to auscultation bilaterally. Diminished bases.  Heart:  S1, S2 present without murmurs appreciated.  Abdomen:  +BS, soft, largely obese, non-tender and non-distended. Unable to appreciate significant HSM due to large AP diameter. No guarding or rebound. No masses appreciated.  Rectal:  Deferred  Msk:  Symmetrical without gross deformities. Normal posture. Extremities:  Without clubbing or edema. Neurologic:   Alert and  oriented x4;  grossly normal neurologically. Skin:  Intact without significant lesions or rashes. Cervical Nodes:  No significant cervical adenopathy. Psych:  Alert and cooperative. Normal mood and affect.

## 2012-11-18 NOTE — Patient Instructions (Addendum)
Stop Prilosec.   Start taking Dexilant 1 capsule daily. This is for reflux.   Call us in about 1 week to let us know how you are doing. If your symptoms are not better, we will need to do an upper endoscopy to look at your esophagus and stomach.   Please have blood work done. We will call you with those results.

## 2012-11-22 DIAGNOSIS — R109 Unspecified abdominal pain: Secondary | ICD-10-CM | POA: Insufficient documentation

## 2012-11-22 NOTE — Assessment & Plan Note (Signed)
37 year old male with abdominal pain, N/V with eating. Hx of erosive esophagitis as evidenced on EGD in May 2009 by Dr. Jena Gauss. Gallbladder has been removed in the past. No improvement with Prilosec, Protonix. Nexium caused nausea. No hematemesis, melena, hematochezia, or change in bowel habits. Will trial Dexilant. If no improvement in next few weeks, will schedule EGD to assess for gastritis, PUD. Gastroparesis also remains in the differential, although this would not explain the abdominal pain. Pt to call with PR in next few weeks. Possibility of EGD discussed with pt and friend present. Both are agreeable if procedure is necessary.

## 2012-11-23 NOTE — Progress Notes (Signed)
Faxed to PCP

## 2012-11-24 ENCOUNTER — Encounter: Payer: Self-pay | Admitting: Internal Medicine

## 2012-11-26 ENCOUNTER — Telehealth: Payer: Self-pay

## 2012-11-26 NOTE — Telephone Encounter (Signed)
Per Gerrit Halls, NP, I called pt to see how he is doing. LMOM for a return call.

## 2012-11-30 NOTE — Telephone Encounter (Signed)
Called and spoke to the pt today. He said he has some constipation and some abdominal cramps. He is taking the Dexilant and it might help some, but he still feels like the food is backed up when he burps. Please advise!

## 2012-12-02 ENCOUNTER — Encounter: Payer: Self-pay | Admitting: Gastroenterology

## 2012-12-02 ENCOUNTER — Ambulatory Visit (INDEPENDENT_AMBULATORY_CARE_PROVIDER_SITE_OTHER): Payer: Medicare Other | Admitting: Gastroenterology

## 2012-12-02 VITALS — BP 150/87 | HR 94 | Temp 97.4°F | Ht 74.0 in | Wt 335.0 lb

## 2012-12-02 DIAGNOSIS — K219 Gastro-esophageal reflux disease without esophagitis: Secondary | ICD-10-CM

## 2012-12-02 DIAGNOSIS — K5900 Constipation, unspecified: Secondary | ICD-10-CM

## 2012-12-02 MED ORDER — POLYETHYLENE GLYCOL 3350 17 GM/SCOOP PO POWD: 17.0000 g | Freq: Every day | ORAL | Status: DC

## 2012-12-02 MED ORDER — DEXLANSOPRAZOLE 60 MG PO CPDR: 60.0000 mg | DELAYED_RELEASE_CAPSULE | Freq: Every day | ORAL | Status: DC

## 2012-12-02 NOTE — Patient Instructions (Addendum)
For constipation:  Take Miralax 1 capful, followed by a full glass of water 30 minutes later. Repeat this UP TO 6 times each hour, or until you have a bowel movement.   You may repeat this again tomorrow if needed.  Purchase a tap water enema from over the counter and use twice if necessary.   Please call us tomorrow with an update.  I have sent the prescription for Dexilant and Miralax to your pharmacy.  Once you have a bowel movement, you may need to take Miralax every other day for constipation.  We will see you in 3 months.

## 2012-12-02 NOTE — Progress Notes (Signed)
Referring Provider: Fredirick Maudlin, MD Primary Care Physician:  Fredirick Maudlin, MD Primary Gastroenterologist: Dr. Jena Gauss   Chief Complaint  Patient presents with  . Constipation    HPI:   37 year old male who I last saw early Jan 2014 and started on Dexilant due to abdominal pain, N/V. Last EGD in May 2009 with Dr. Jena Gauss: erosive esophagitis. Gallbladder no longer present. He has noted no improvement with Prilosec, Protonix, or Nexium in the past. No change in bowel habits was noted at last visit. Plan was to pursue EGD if no improvement, and gastroparesis remained in the differential. Much improvement with Dexilant.  He now presents today with constipation, states no BM in 2 weeks. States upper abdominal cramping. States he is passing gas, but he notes his burps smell bad and smell like a "bowel odor". Denies any prior issues with constipation. No rectal bleeding. States he had a stomach virus not too long ago, hasn't been to the bathroom since. Tried suppositories, liquid citrate, Metamucil, Miralax. Tried fleet enema. Only water comes out.  No N/V. Eating ok. Appetite is good.   Past Medical History  Diagnosis Date  . Schizoaffective disorder     Day Loraine Leriche  . Bipolar 1 disorder   . Anxiety   . Hypertension   . Hyperlipidemia   . GERD (gastroesophageal reflux disease)   . Complication of anesthesia     Anxiety when he awaken with ET tube still in place  . Hypoventilation syndrome     Past Surgical History  Procedure Date  . Cholecystectomy   . Ankle surgery   . Appendectomy   . Nasal sinus surgery 01/24/2012    Procedure: ENDOSCOPIC SINUS SURGERY;  Surgeon: Serena Colonel, MD;  Location: [facility] OR;  Service: ENT;  Laterality: Left;  left ethmoidectomy, maxillary, frontal  . Esophagogastroduodenoscopy 03/17/2008    RMR: A tiny distal esophageal erosions consistent with mild  erosive reflux esophagitis, otherwise normal esophagus, small hiatal hernia, minimally polypoid antral mucosa  with doubtful clinical was significance.  Otherwise, normal stomach, patent pylorus, and normal D1-D2  . Umbilical hernia repair     Current Outpatient Prescriptions  Medication Sig Dispense Refill  . albuterol (PROVENTIL HFA;VENTOLIN HFA) 108 (90 BASE) MCG/ACT inhaler Inhale 1-2 puffs into the lungs every 6 (six) hours as needed for wheezing.  1 Inhaler  0  . benztropine (COGENTIN) 0.5 MG tablet Take 1 mg by mouth at bedtime.       . cetirizine (ZYRTEC) 10 MG tablet Take 10 mg by mouth every morning.      . Choline Fenofibrate 135 MG capsule Take 135 mg by mouth every morning.      . citalopram (CELEXA) 20 MG tablet Take 20 mg by mouth every morning.      Marland Kitchen dexlansoprazole (DEXILANT) 60 MG capsule Take 1 capsule (60 mg total) by mouth daily.  30 capsule  3  . diazepam (VALIUM) 5 MG tablet Take 5 mg by mouth 3 (three) times daily as needed. For anxiety      . doxepin (SINEQUAN) 150 MG capsule Take 150 mg by mouth at bedtime.      . gabapentin (NEURONTIN) 400 MG capsule Take 400 mg by mouth 2 (two) times daily.      Marland Kitchen gabapentin (NEURONTIN) 800 MG tablet Take 800 mg by mouth at bedtime.       . haloperidol decanoate (HALDOL DECANOATE) 100 MG/ML injection Inject 150 mg into the muscle every 28 (twenty-eight) days.      Marland Kitchen  HYDROcodone-acetaminophen (NORCO) 10-325 MG per tablet Take 1 tablet by mouth every 6 (six) hours as needed.      . lubiprostone (AMITIZA) 8 MCG capsule Take 8 mcg by mouth 2 (two) times daily with a meal.      . metoprolol (LOPRESSOR) 50 MG tablet Take 50 mg by mouth 2 (two) times daily.        . niacin (NIASPAN) 500 MG CR tablet Take 500 mg by mouth at bedtime.        Marland Kitchen omeprazole (PRILOSEC) 40 MG capsule Take 40 mg by mouth every morning.       . predniSONE (DELTASONE) 50 MG tablet Take 1 tablet (50 mg total) by mouth daily.  6 tablet  0  . QUEtiapine (SEROQUEL) 400 MG tablet Take 800 mg by mouth at bedtime.      Marland Kitchen tiZANidine (ZANAFLEX) 4 MG capsule Take 4 mg by mouth 3  (three) times daily.      . traZODone (DESYREL) 150 MG tablet Take 150 mg by mouth at bedtime.         Allergies as of 12/02/2012 - Review Complete 12/02/2012  Allergen Reaction Noted  . Bee venom  04/23/2012  . Compazine  11/01/2011  . Imitrex (sumatriptan base) Nausea And Vomiting 10/18/2011  . Prochlorperazine edisylate  01/07/2008  . Risperidone  01/07/2008    Family History  Problem Relation Age of Onset  . Anesthesia problems Mother   . Colon cancer Maternal Grandfather   . Crohn's disease Maternal Grandfather     History   Social History  . Marital Status: Divorced    Spouse Name: N/A    Number of Children: N/A  . Years of Education: N/A   Social History Main Topics  . Smoking status: Current Every Day Smoker -- 1.0 packs/day for 22 years    Types: Cigarettes  . Smokeless tobacco: Never Used  . Alcohol Use: No  . Drug Use: No  . Sexually Active:    Other Topics Concern  . None   Social History Narrative  . None    Review of Systems: Negative unless mentioned in HPI  Physical Exam: BP 150/87  Pulse 94  Temp 97.4 F (36.3 C) (Oral)  Ht 6\' 2"  (1.88 m)  Wt 335 lb (151.955 kg)  BMI 43.01 kg/m2 General:   Alert and oriented. No distress noted. Pleasant and cooperative.  Head:  Normocephalic and atraumatic. Eyes:  Conjuctiva clear without scleral icterus. Mouth:  Oral mucosa pink and moist. Good dentition. No lesions. Heart:  S1, S2 present without murmurs, rubs, or gallops. Regular rate and rhythm. Abdomen:  +BS, soft, obese, non-tender and non-distended. No rebound or guarding. Difficult to appreciate HSM due to body habitus. Rectal: internal exam with soft stool noted in rectal vault; no evidence of mass, dilated rectal vault, good sphincter tone.  Msk:  Symmetrical without gross deformities. Normal posture. Extremities:  Without edema. Neurologic:  Alert and  oriented x4;  grossly normal neurologically. Skin:  Intact without significant lesions or  rashes. Psych:  Alert and cooperative. Normal mood and affect.

## 2012-12-02 NOTE — Assessment & Plan Note (Signed)
Significant improvement with Dexilant. He has tried/failed multiple other PPIs. Last EGD 2009 with erosive esophagitis. No need for repeat EGD, as he is improving. 3 mos f/u.

## 2012-12-02 NOTE — Assessment & Plan Note (Signed)
Recently recovered from acute GI illness several weeks ago; notes absence of bowel movement since. NO abdominal distension on today's exam, abdomen soft, no TTP. Rectal exam with soft stool in rectal vault. Appears remote hx of IBS-C after review of records. No clinical signs of obstruction; I feel he would benefit from a bowel purge. He declines using Moviprep. We will use Miralax, repeat X 1 if necessary. See pt instructions.  He is to call me tomorrow with an update. Consider low-dose Amitiza.  3 mos f/u

## 2012-12-02 NOTE — Progress Notes (Signed)
Faxed to PCP

## 2012-12-02 NOTE — Telephone Encounter (Signed)
Pt seen by myself today.

## 2012-12-08 DIAGNOSIS — R109 Unspecified abdominal pain: Secondary | ICD-10-CM | POA: Diagnosis not present

## 2012-12-08 LAB — CBC WITH DIFFERENTIAL/PLATELET
Basophils Absolute: 0 10*3/uL (ref 0.0–0.1)
Basophils Relative: 0 % (ref 0–1)
Hemoglobin: 15.9 g/dL (ref 13.0–17.0)
MCHC: 34.7 g/dL (ref 30.0–36.0)
Neutro Abs: 4.5 10*3/uL (ref 1.7–7.7)
Neutrophils Relative %: 59 % (ref 43–77)
Platelets: 223 10*3/uL (ref 150–400)
RDW: 14.4 % (ref 11.5–15.5)

## 2012-12-08 LAB — HEPATIC FUNCTION PANEL
Bilirubin, Direct: 0.1 mg/dL (ref 0.0–0.3)
Total Bilirubin: 0.5 mg/dL (ref 0.3–1.2)

## 2012-12-09 DIAGNOSIS — F411 Generalized anxiety disorder: Secondary | ICD-10-CM | POA: Diagnosis not present

## 2012-12-09 DIAGNOSIS — M545 Low back pain: Secondary | ICD-10-CM | POA: Diagnosis not present

## 2012-12-09 DIAGNOSIS — J449 Chronic obstructive pulmonary disease, unspecified: Secondary | ICD-10-CM | POA: Diagnosis not present

## 2012-12-09 DIAGNOSIS — R05 Cough: Secondary | ICD-10-CM | POA: Diagnosis not present

## 2012-12-11 ENCOUNTER — Ambulatory Visit (HOSPITAL_COMMUNITY)
Admission: RE | Admit: 2012-12-11 | Discharge: 2012-12-11 | Disposition: A | Discharge status: Home / Self Care | Payer: Medicare Other | Source: Ambulatory Visit | Attending: Pulmonary Disease | Admitting: Pulmonary Disease

## 2012-12-11 ENCOUNTER — Other Ambulatory Visit (HOSPITAL_COMMUNITY): Payer: Self-pay | Admitting: Pulmonary Disease

## 2012-12-11 DIAGNOSIS — R05 Cough: Secondary | ICD-10-CM | POA: Diagnosis not present

## 2012-12-11 DIAGNOSIS — I10 Essential (primary) hypertension: Secondary | ICD-10-CM | POA: Diagnosis not present

## 2012-12-11 DIAGNOSIS — F17200 Nicotine dependence, unspecified, uncomplicated: Secondary | ICD-10-CM | POA: Diagnosis not present

## 2012-12-11 DIAGNOSIS — R059 Cough, unspecified: Secondary | ICD-10-CM | POA: Insufficient documentation

## 2012-12-14 DIAGNOSIS — M79609 Pain in unspecified limb: Secondary | ICD-10-CM | POA: Diagnosis not present

## 2012-12-14 DIAGNOSIS — M47817 Spondylosis without myelopathy or radiculopathy, lumbosacral region: Secondary | ICD-10-CM | POA: Diagnosis not present

## 2012-12-14 DIAGNOSIS — M545 Low back pain: Secondary | ICD-10-CM | POA: Diagnosis not present

## 2012-12-15 NOTE — Progress Notes (Signed)
Quick Note:  CBC normal. How is pt? He was supposed to let us know how his bowel purge went.  He may benefit from Amitiza. ______

## 2012-12-15 NOTE — Progress Notes (Signed)
Quick Note:  Tried to call pt- got recording, it said number was "unavailable at this time" ______

## 2012-12-16 NOTE — Progress Notes (Signed)
Quick Note:  Spoke with pt, he stated he was doing good. He is taking the miralax qod and is having good bm's. ______

## 2012-12-22 DIAGNOSIS — M47817 Spondylosis without myelopathy or radiculopathy, lumbosacral region: Secondary | ICD-10-CM | POA: Diagnosis not present

## 2012-12-22 DIAGNOSIS — M79609 Pain in unspecified limb: Secondary | ICD-10-CM | POA: Diagnosis not present

## 2012-12-22 DIAGNOSIS — M545 Low back pain: Secondary | ICD-10-CM | POA: Diagnosis not present

## 2012-12-31 ENCOUNTER — Telehealth: Payer: Self-pay

## 2012-12-31 NOTE — Telephone Encounter (Signed)
Pt called requesting samples of dexilant. Gave #2 boxes of dexilant.

## 2013-01-06 DIAGNOSIS — I10 Essential (primary) hypertension: Secondary | ICD-10-CM | POA: Diagnosis not present

## 2013-01-06 DIAGNOSIS — F411 Generalized anxiety disorder: Secondary | ICD-10-CM | POA: Diagnosis not present

## 2013-01-06 DIAGNOSIS — J449 Chronic obstructive pulmonary disease, unspecified: Secondary | ICD-10-CM | POA: Diagnosis not present

## 2013-01-06 DIAGNOSIS — M545 Low back pain: Secondary | ICD-10-CM | POA: Diagnosis not present

## 2013-01-07 ENCOUNTER — Telehealth: Payer: Self-pay | Admitting: *Deleted

## 2013-01-07 NOTE — Telephone Encounter (Signed)
[name] called today. He is having bad burps and would like to know if we have anything we can give him. Please follow up. Thank you.

## 2013-01-08 NOTE — Telephone Encounter (Signed)
Spoke with AS yesterday- pt needs gas information sheet mailed to him and a sooner office visit. Tried to call pt NA. Gas information in mail to pt.   Darl Pikes, please make pt appt. Thanks.

## 2013-01-08 NOTE — Telephone Encounter (Signed)
Pt is aware of OV on 4/22 at 10 with AS

## 2013-01-12 NOTE — Telephone Encounter (Signed)
Per AS- she wanted pt to have an appointment within the next 2-3 weeks. Please reschedule

## 2013-01-12 NOTE — Telephone Encounter (Signed)
Pt is aware to disregard OV in April and is aware of new OV on 3/27 at 2 with AS

## 2013-02-04 ENCOUNTER — Ambulatory Visit (INDEPENDENT_AMBULATORY_CARE_PROVIDER_SITE_OTHER): Payer: Medicare Other | Admitting: Gastroenterology

## 2013-02-04 ENCOUNTER — Encounter: Payer: Self-pay | Admitting: Gastroenterology

## 2013-02-04 VITALS — BP 115/69 | HR 73 | Temp 98.6°F | Ht 74.0 in | Wt 330.0 lb

## 2013-02-04 DIAGNOSIS — K219 Gastro-esophageal reflux disease without esophagitis: Secondary | ICD-10-CM | POA: Diagnosis not present

## 2013-02-04 DIAGNOSIS — R1013 Epigastric pain: Secondary | ICD-10-CM | POA: Insufficient documentation

## 2013-02-04 DIAGNOSIS — K5900 Constipation, unspecified: Secondary | ICD-10-CM

## 2013-02-04 MED ORDER — LINACLOTIDE 290 MCG PO CAPS: 1.0000 | ORAL_CAPSULE | Freq: Every day | ORAL | Status: DC

## 2013-02-04 NOTE — Patient Instructions (Addendum)
Start taking Linzess each morning, 30 minutes before breakfast. I have provided samples and a prescription. This is for constipation.   Continue taking Dexilant each morning.   I have ordered a CT scan of your belly.  We have also scheduled you for an upper endoscopy with Dr. Jena Gauss in the near future due to your continued nausea, vomiting, and small amounts of bloody vomit.   You may need further testing after this, but that will be decided after the endoscopy is completed.

## 2013-02-04 NOTE — Progress Notes (Signed)
Referring Provider: Fredirick Maudlin, MD Primary Care Physician:  Fredirick Maudlin, MD Primary Gastroenterologist: Dr. Jena Gauss   Chief Complaint  Patient presents with  . Abdominal Pain    HPI:   37 year old male with history of abdominal pain, N/V, and GERD. Last EGD in May 2009 with Dr. Jena Gauss: erosive esophagitis. Gallbladder no longer present. He has noted no improvement with Prilosec, Protonix, or Nexium in the past. Improvement recently with Dexilant. Given Miralax purge at last appt due to constipation.   Sometimes he vomits after eating, notes a little streaky blood. Vomiting 2-3 times per week. Will eat a piece of bread and may have to vomit. States every time he eats he starts cramping usually 5-10 minutes later. Middle-abdomen, cramping into a knot. Then he throws up. States appetite is not good. Doesn't want to eat because he will cramp up.   Goes up to 2 weeks at a time without a BM. Had results with Miralax but then went right back to constipation. No rectal bleeding. No melena.    Past Medical History  Diagnosis Date  . Schizoaffective disorder     Day Loraine Leriche  . Bipolar 1 disorder   . Anxiety   . Hypertension   . Hyperlipidemia   . GERD (gastroesophageal reflux disease)   . Complication of anesthesia     Anxiety when he awaken with ET tube still in place  . Hypoventilation syndrome     Past Surgical History  Procedure Laterality Date  . Cholecystectomy    . Ankle surgery    . Appendectomy    . Nasal sinus surgery  01/24/2012    Procedure: ENDOSCOPIC SINUS SURGERY;  Surgeon: Serena Colonel, MD;  Location: [facility] OR;  Service: ENT;  Laterality: Left;  left ethmoidectomy, maxillary, frontal  . Esophagogastroduodenoscopy  03/17/2008    RMR: A tiny distal esophageal erosions consistent with mild  erosive reflux esophagitis, otherwise normal esophagus, small hiatal hernia, minimally polypoid antral mucosa with doubtful clinical was significance.  Otherwise, normal stomach, patent  pylorus, and normal D1-D2  . Umbilical hernia repair      Current Outpatient Prescriptions  Medication Sig Dispense Refill  . albuterol (PROVENTIL HFA;VENTOLIN HFA) 108 (90 BASE) MCG/ACT inhaler Inhale 1-2 puffs into the lungs every 6 (six) hours as needed for wheezing.  1 Inhaler  0  . benztropine (COGENTIN) 0.5 MG tablet Take 1 mg by mouth at bedtime.       . cetirizine (ZYRTEC) 10 MG tablet Take 10 mg by mouth every morning.      . Choline Fenofibrate 135 MG capsule Take 135 mg by mouth every morning.      . citalopram (CELEXA) 20 MG tablet Take 20 mg by mouth every morning.      Marland Kitchen dexlansoprazole (DEXILANT) 60 MG capsule Take 1 capsule (60 mg total) by mouth daily.  30 capsule  3  . diazepam (VALIUM) 5 MG tablet Take 5 mg by mouth 3 (three) times daily as needed. For anxiety      . doxepin (SINEQUAN) 150 MG capsule Take 150 mg by mouth at bedtime.      . gabapentin (NEURONTIN) 400 MG capsule Take 400 mg by mouth 2 (two) times daily.      Marland Kitchen gabapentin (NEURONTIN) 800 MG tablet Take 800 mg by mouth at bedtime.       . haloperidol decanoate (HALDOL DECANOATE) 100 MG/ML injection Inject 150 mg into the muscle every 28 (twenty-eight) days.      Marland Kitchen HYDROcodone-acetaminophen (  NORCO) 10-325 MG per tablet Take 1 tablet by mouth every 6 (six) hours as needed.      . metoprolol (LOPRESSOR) 50 MG tablet Take 50 mg by mouth 2 (two) times daily.        . niacin (NIASPAN) 500 MG CR tablet Take 500 mg by mouth at bedtime.        . polyethylene glycol powder (GLYCOLAX/MIRALAX) powder Take 17 g by mouth daily.  527 g  3  . QUEtiapine (SEROQUEL) 400 MG tablet Take 800 mg by mouth at bedtime.      Marland Kitchen tiZANidine (ZANAFLEX) 4 MG capsule Take 4 mg by mouth 3 (three) times daily.      . traZODone (DESYREL) 150 MG tablet Take 150 mg by mouth at bedtime.       . Linaclotide 290 MCG CAPS Take 1 capsule by mouth daily.  30 capsule  3   No current facility-administered medications for this visit.    Allergies as  of 02/04/2013 - Review Complete 02/04/2013  Allergen Reaction Noted  . Bee venom  04/23/2012  . Compazine  11/01/2011  . Imitrex (sumatriptan base) Nausea And Vomiting 10/18/2011  . Prochlorperazine edisylate  01/07/2008  . Risperidone  01/07/2008    Family History  Problem Relation Age of Onset  . Anesthesia problems Mother   . Colon cancer Maternal Grandfather   . Crohn's disease Maternal Grandfather     History   Social History  . Marital Status: Divorced    Spouse Name: N/A    Number of Children: N/A  . Years of Education: N/A   Social History Main Topics  . Smoking status: Current Every Day Smoker -- 1.00 packs/day for 22 years    Types: Cigarettes  . Smokeless tobacco: Never Used  . Alcohol Use: No  . Drug Use: No  . Sexually Active:    Other Topics Concern  . None   Social History Narrative  . None    Review of Systems: Negative unless otherwise mentioned in HPI.   Physical Exam: BP 115/69  Pulse 73  Temp(Src) 98.6 F (37 C) (Oral)  Ht 6\' 2"  (1.88 m)  Wt 330 lb (149.687 kg)  BMI 42.35 kg/m2 General:   Alert and oriented. No distress noted. Pleasant and cooperative.  Head:  Normocephalic and atraumatic. Eyes:  Conjuctiva clear without scleral icterus. Mouth:  Oral mucosa pink and moist.  Neck:  Supple, without mass or thyromegaly. Heart:  S1, S2 present without murmurs, rubs, or gallops. Regular rate and rhythm. Abdomen:  +BS, soft, significantly TTP LUQ, epigastric region, obese. Appears questionable bulge middle/left sided abdomen, ?ventral hernia Msk:  Symmetrical without gross deformities. Normal posture. Extremities:  Without edema. Neurologic:  Alert and  oriented x4;  grossly normal neurologically. Skin:  Intact without significant lesions or rashes. Cervical Nodes:  No significant cervical adenopathy. Psych:  Alert and cooperative. Normal mood and affect.

## 2013-02-05 ENCOUNTER — Ambulatory Visit (HOSPITAL_COMMUNITY)
Admission: RE | Admit: 2013-02-05 | Discharge: 2013-02-05 | Disposition: A | Discharge status: Home / Self Care | Payer: Medicare Other | Source: Ambulatory Visit | Attending: Gastroenterology | Admitting: Gastroenterology

## 2013-02-05 ENCOUNTER — Encounter (HOSPITAL_COMMUNITY): Payer: Self-pay | Admitting: Pharmacy Technician

## 2013-02-05 DIAGNOSIS — R1013 Epigastric pain: Secondary | ICD-10-CM | POA: Diagnosis not present

## 2013-02-05 MED ORDER — IOHEXOL 300 MG/ML  SOLN
100.0000 mL | Freq: Once | INTRAMUSCULAR | Status: AC | PRN
  Administered 2013-02-05: 100 mL via INTRAVENOUS

## 2013-02-06 NOTE — Assessment & Plan Note (Signed)
Tried and failed multiple PPIs in the past. Continue Dexilant.

## 2013-02-06 NOTE — Assessment & Plan Note (Signed)
37 year old male with history of erosive esophagitis with last Last EGD in May 2009 with Dr. Jena Gauss. Notes epigastric cramping with eating, followed by nausea and vomiting. Intermittent streaky hematemesis, small amount. Likely secondary to esophagitis, possible MW tear, unable to rule out gastritis or ulcerative process. Intermittent N/V may be secondary to undiagnosed gastroparesis. Significantly TTP LUQ on exam. Proceed with CT abd/pelvis now.  Proceed with upper endoscopy as well in the near future with Dr. Jena Gauss. The risks, benefits, and alternatives have been discussed in detail with patient. They have stated understanding and desire to proceed.  PROPOFOL DUE TO POLYPHARMACY

## 2013-02-06 NOTE — Assessment & Plan Note (Signed)
Significant with need for more aggressive bowel regimen. No rectal bleeding. Add Linzess 290 mcg daily. Prescription provided.

## 2013-02-08 DIAGNOSIS — I10 Essential (primary) hypertension: Secondary | ICD-10-CM | POA: Diagnosis not present

## 2013-02-08 DIAGNOSIS — M545 Low back pain: Secondary | ICD-10-CM | POA: Diagnosis not present

## 2013-02-08 DIAGNOSIS — E785 Hyperlipidemia, unspecified: Secondary | ICD-10-CM | POA: Diagnosis not present

## 2013-02-08 NOTE — Progress Notes (Signed)
Quick Note:  CT reviewed, no evidence of hernia.  No significant abnormalities. EGD as planned. ______

## 2013-02-11 ENCOUNTER — Encounter (HOSPITAL_COMMUNITY)
Admission: RE | Admit: 2013-02-11 | Discharge: 2013-02-11 | Disposition: A | Discharge status: Home / Self Care | Payer: Medicare Other | Source: Ambulatory Visit | Attending: Internal Medicine | Admitting: Internal Medicine

## 2013-02-11 ENCOUNTER — Encounter (HOSPITAL_COMMUNITY): Payer: Self-pay

## 2013-02-11 DIAGNOSIS — Z01812 Encounter for preprocedural laboratory examination: Secondary | ICD-10-CM | POA: Diagnosis not present

## 2013-02-11 DIAGNOSIS — R1310 Dysphagia, unspecified: Secondary | ICD-10-CM | POA: Diagnosis not present

## 2013-02-11 DIAGNOSIS — R1013 Epigastric pain: Secondary | ICD-10-CM | POA: Diagnosis not present

## 2013-02-11 DIAGNOSIS — R112 Nausea with vomiting, unspecified: Secondary | ICD-10-CM | POA: Diagnosis not present

## 2013-02-11 DIAGNOSIS — I10 Essential (primary) hypertension: Secondary | ICD-10-CM | POA: Diagnosis not present

## 2013-02-11 HISTORY — DX: Unspecified asthma, uncomplicated: J45.909

## 2013-02-11 LAB — BASIC METABOLIC PANEL
Calcium: 9 mg/dL (ref 8.4–10.5)
GFR calc non Af Amer: >90 mL/min (ref >90)
Glucose, Bld: 105 mg/dL — ABNORMAL HIGH (ref 70–99)
Potassium: 3.8 mEq/L (ref 3.5–5.1)
Sodium: 140 mEq/L (ref 135–145)

## 2013-02-11 LAB — HEMOGLOBIN AND HEMATOCRIT, BLOOD
HCT: 41.2 % (ref 39.0–52.0)
Hemoglobin: 14.2 g/dL (ref 13.0–17.0)

## 2013-02-11 NOTE — Progress Notes (Signed)
Patient at risk for sleep apnea. Patient stated that he already had sleep study less than a year ago. Is this correct?  02/11/13 1028  OBSTRUCTIVE SLEEP APNEA  Have you ever been diagnosed with sleep apnea through a sleep study? No  Do you snore loudly (loud enough to be heard through closed doors)?  1  Do you often feel tired, fatigued, or sleepy during the daytime? 0  Has anyone observed you stop breathing during your sleep? 0  Do you have, or are you being treated for high blood pressure? 1  BMI more than 35 kg/m2? 1  Age over 38 years old? 0  Neck circumference greater than 40 cm/18 inches? 1  Gender: 1  Obstructive Sleep Apnea Score 5  Score 4 or greater  Results sent to PCP (Dr. Juanetta Gosling already aware. Pt. had sleep study AP)

## 2013-02-11 NOTE — Patient Instructions (Addendum)
Connie Hilgert  02/11/2013   Your procedure is scheduled on:  Thursday,April 10  Report to [id number]. Call this number if you have problems the morning of surgery: [phone]   Remember:   Do not eat food or drink liquids after midnight.   Take these medicines the morning of surgery with A SIP OF WATER: zyrtec,valium,neurontin,metoprolol,oxycodone,seroquel   Do not wear jewelry, make-up or nail polish.  Do not wear lotions, powders, or perfumes. You may wear deodorant.  Do not shave 48 hours prior to surgery. Men may shave face and neck.  Do not bring valuables to the hospital.  Contacts, dentures or bridgework may not be worn into surgery.  Leave suitcase in the car. After surgery it may be brought to your room.  For patients admitted to the hospital, checkout time is 11:00 AM the day of  discharge.   Patients discharged the day of surgery will not be allowed to drive  home.  Name and phone number of your driver: [employer])  Special Instructions: N/A   Please read over the following fact sheets that you were given: Pain Booklet, Coughing and Deep Breathing, MRSA Information, Surgical Site Infection Prevention, Anesthesia Post-op Instructions and Care and Recovery After Surgery  Esophagogastroduodenoscopy This is an endoscopic procedure (a procedure that uses a device like a flexible telescope) that allows your caregiver to view the upper stomach and small bowel. This test allows your caregiver to look at the esophagus. The esophagus carries food from your mouth to your stomach. They can also look at your duodenum. This is the first part of the small intestine that attaches to the stomach. This test is used to detect problems in the bowel such as ulcers and inflammation. PREPARATION FOR TEST Nothing to eat after midnight the day before the test. NORMAL FINDINGS Normal esophagus, stomach, and duodenum. Ranges for normal findings may vary among  different laboratories and hospitals. You should always check with your doctor after having lab work or other tests done to discuss the meaning of your test results and whether your values are considered within normal limits. MEANING OF TEST  Your caregiver will go over the test results with you and discuss the importance and meaning of your results, as well as treatment options and the need for additional tests if necessary. OBTAINING THE TEST RESULTS It is your responsibility to obtain your test results. Ask the lab or department performing the test when and how you will get your results. Document Released: 02/28/2005 Document Revised: 01/20/2012 Document Reviewed: 10/07/2008 [facility] Patient Information 2013 [place], [place]. PATIENT INSTRUCTIONS POST-ANESTHESIA  IMMEDIATELY FOLLOWING SURGERY:  Do not drive or operate machinery for the first twenty four hours after surgery.  Do not make any important decisions for twenty four hours after surgery or while taking narcotic pain medications or sedatives.  If you develop intractable nausea and vomiting or a severe headache please notify your doctor immediately.  FOLLOW-UP:  Please make an appointment with your surgeon as instructed. You do not need to follow up with anesthesia unless specifically instructed to do so.  WOUND CARE INSTRUCTIONS (if applicable):  Keep a dry clean dressing on the anesthesia/puncture wound site if there is drainage.  Once the wound has quit draining you may leave it open to air.  Generally you should leave the bandage intact for twenty four hours unless there is drainage.  If the epidural site drains for more than 36-48 hours please call the anesthesia department.  QUESTIONS?:  Please feel free to call your physician or the hospital operator if you have any questions, and they will be happy to assist you.

## 2013-02-18 ENCOUNTER — Encounter (HOSPITAL_COMMUNITY): Payer: Self-pay | Admitting: Anesthesiology

## 2013-02-18 ENCOUNTER — Ambulatory Visit (HOSPITAL_COMMUNITY)
Admission: RE | Admit: 2013-02-18 | Discharge: 2013-02-18 | Disposition: A | Discharge status: Home / Self Care | Payer: Medicare Other | Source: Ambulatory Visit | Attending: Internal Medicine | Admitting: Internal Medicine

## 2013-02-18 ENCOUNTER — Encounter (HOSPITAL_COMMUNITY)
Admission: RE | Disposition: A | Discharge status: Home / Self Care | Payer: Self-pay | Source: Ambulatory Visit | Attending: Internal Medicine

## 2013-02-18 ENCOUNTER — Encounter (HOSPITAL_COMMUNITY): Payer: Self-pay | Admitting: *Deleted

## 2013-02-18 ENCOUNTER — Ambulatory Visit (HOSPITAL_COMMUNITY): Payer: Medicare Other | Admitting: Anesthesiology

## 2013-02-18 DIAGNOSIS — R1310 Dysphagia, unspecified: Secondary | ICD-10-CM | POA: Insufficient documentation

## 2013-02-18 DIAGNOSIS — K3184 Gastroparesis: Secondary | ICD-10-CM

## 2013-02-18 DIAGNOSIS — I10 Essential (primary) hypertension: Secondary | ICD-10-CM | POA: Insufficient documentation

## 2013-02-18 DIAGNOSIS — R1013 Epigastric pain: Secondary | ICD-10-CM | POA: Insufficient documentation

## 2013-02-18 DIAGNOSIS — Z01812 Encounter for preprocedural laboratory examination: Secondary | ICD-10-CM | POA: Diagnosis not present

## 2013-02-18 DIAGNOSIS — R112 Nausea with vomiting, unspecified: Secondary | ICD-10-CM | POA: Insufficient documentation

## 2013-02-18 DIAGNOSIS — F209 Schizophrenia, unspecified: Secondary | ICD-10-CM | POA: Diagnosis not present

## 2013-02-18 HISTORY — PX: ESOPHAGOGASTRODUODENOSCOPY (EGD) WITH PROPOFOL: SHX5813

## 2013-02-18 SURGERY — ESOPHAGOGASTRODUODENOSCOPY (EGD) WITH PROPOFOL
Anesthesia: Monitor Anesthesia Care

## 2013-02-18 MED ORDER — PROPOFOL 10 MG/ML IV EMUL
INTRAVENOUS | Status: AC
  Filled 2013-02-18: qty 20

## 2013-02-18 MED ORDER — ONDANSETRON HCL 4 MG/2ML IJ SOLN
4.0000 mg | Freq: Once | INTRAMUSCULAR | Status: AC
  Administered 2013-02-18: 4 mg via INTRAVENOUS

## 2013-02-18 MED ORDER — FENTANYL CITRATE 0.05 MG/ML IJ SOLN: 25.0000 ug | INTRAMUSCULAR | Status: DC | PRN

## 2013-02-18 MED ORDER — MIDAZOLAM HCL 2 MG/2ML IJ SOLN
INTRAMUSCULAR | Status: AC
  Filled 2013-02-18: qty 2

## 2013-02-18 MED ORDER — MIDAZOLAM HCL 2 MG/2ML IJ SOLN
1.0000 mg | INTRAMUSCULAR | Status: AC | PRN
  Administered 2013-02-18 (×3): 2 mg via INTRAVENOUS

## 2013-02-18 MED ORDER — FENTANYL CITRATE 0.05 MG/ML IJ SOLN
INTRAMUSCULAR | Status: AC
  Filled 2013-02-18: qty 2

## 2013-02-18 MED ORDER — ONDANSETRON HCL 4 MG/2ML IJ SOLN
INTRAMUSCULAR | Status: AC
  Filled 2013-02-18: qty 2

## 2013-02-18 MED ORDER — PROPOFOL INFUSION 10 MG/ML OPTIME
INTRAVENOUS | Status: DC | PRN
  Administered 2013-02-18: 75 ug/kg/min via INTRAVENOUS

## 2013-02-18 MED ORDER — FENTANYL CITRATE 0.05 MG/ML IJ SOLN
25.0000 ug | INTRAMUSCULAR | Status: DC | PRN
  Administered 2013-02-18: 25 ug via INTRAVENOUS

## 2013-02-18 MED ORDER — STERILE WATER FOR IRRIGATION IR SOLN
Status: DC | PRN
  Administered 2013-02-18: 08:00:00

## 2013-02-18 MED ORDER — ONDANSETRON HCL 4 MG/2ML IJ SOLN: 4.0000 mg | Freq: Once | INTRAMUSCULAR | Status: DC | PRN

## 2013-02-18 MED ORDER — LACTATED RINGERS IV SOLN
INTRAVENOUS | Status: DC
  Administered 2013-02-18: 07:00:00 via INTRAVENOUS

## 2013-02-18 SURGICAL SUPPLY — 8 items
BLOCK BITE 60FR ADLT L/F BLUE (MISCELLANEOUS) ×2 IMPLANT
FLOOR PAD 36X40 (MISCELLANEOUS) ×2
MANIFOLD NEPTUNE II (INSTRUMENTS) ×2 IMPLANT
PAD FLOOR 36X40 (MISCELLANEOUS) ×1 IMPLANT
SYR 50ML LL SCALE MARK (SYRINGE) ×2 IMPLANT
TUBING ENDO SMARTCAP PENTAX (MISCELLANEOUS) ×2 IMPLANT
TUBING IRRIGATION ENDOGATOR (MISCELLANEOUS) ×2 IMPLANT
WATER STERILE IRR 1000ML POUR (IV SOLUTION) ×2 IMPLANT

## 2013-02-18 NOTE — H&P (View-Only) (Signed)
Referring Provider: Hawkins, Edward L, MD Primary Care Physician:  HAWKINS,EDWARD L, MD Primary Gastroenterologist: Dr. Rourk   Chief Complaint  Patient presents with  . Abdominal Pain    HPI:   36-year-old male with history of abdominal pain, N/V, and GERD. Last EGD in May 2009 with Dr. Rourk: erosive esophagitis. Gallbladder no longer present. He has noted no improvement with Prilosec, Protonix, or Nexium in the past. Improvement recently with Dexilant. Given Miralax purge at last appt due to constipation.   Sometimes he vomits after eating, notes a little streaky blood. Vomiting 2-3 times per week. Will eat a piece of bread and may have to vomit. States every time he eats he starts cramping usually 5-10 minutes later. Middle-abdomen, cramping into a knot. Then he throws up. States appetite is not good. Doesn't want to eat because he will cramp up.   Goes up to 2 weeks at a time without a BM. Had results with Miralax but then went right back to constipation. No rectal bleeding. No melena.    Past Medical History  Diagnosis Date  . Schizoaffective disorder     Day Mark  . Bipolar 1 disorder   . Anxiety   . Hypertension   . Hyperlipidemia   . GERD (gastroesophageal reflux disease)   . Complication of anesthesia     Anxiety when he awaken with ET tube still in place  . Hypoventilation syndrome     Past Surgical History  Procedure Laterality Date  . Cholecystectomy    . Ankle surgery    . Appendectomy    . Nasal sinus surgery  01/24/2012    Procedure: ENDOSCOPIC SINUS SURGERY;  Surgeon: Jefry Rosen, MD;  Location: [facility] OR;  Service: ENT;  Laterality: Left;  left ethmoidectomy, maxillary, frontal  . Esophagogastroduodenoscopy  03/17/2008    RMR: A tiny distal esophageal erosions consistent with mild  erosive reflux esophagitis, otherwise normal esophagus, small hiatal hernia, minimally polypoid antral mucosa with doubtful clinical was significance.  Otherwise, normal stomach, patent  pylorus, and normal D1-D2  . Umbilical hernia repair      Current Outpatient Prescriptions  Medication Sig Dispense Refill  . albuterol (PROVENTIL HFA;VENTOLIN HFA) 108 (90 BASE) MCG/ACT inhaler Inhale 1-2 puffs into the lungs every 6 (six) hours as needed for wheezing.  1 Inhaler  0  . benztropine (COGENTIN) 0.5 MG tablet Take 1 mg by mouth at bedtime.       . cetirizine (ZYRTEC) 10 MG tablet Take 10 mg by mouth every morning.      . Choline Fenofibrate 135 MG capsule Take 135 mg by mouth every morning.      . citalopram (CELEXA) 20 MG tablet Take 20 mg by mouth every morning.      . dexlansoprazole (DEXILANT) 60 MG capsule Take 1 capsule (60 mg total) by mouth daily.  30 capsule  3  . diazepam (VALIUM) 5 MG tablet Take 5 mg by mouth 3 (three) times daily as needed. For anxiety      . doxepin (SINEQUAN) 150 MG capsule Take 150 mg by mouth at bedtime.      . gabapentin (NEURONTIN) 400 MG capsule Take 400 mg by mouth 2 (two) times daily.      . gabapentin (NEURONTIN) 800 MG tablet Take 800 mg by mouth at bedtime.       . haloperidol decanoate (HALDOL DECANOATE) 100 MG/ML injection Inject 150 mg into the muscle every 28 (twenty-eight) days.      . HYDROcodone-acetaminophen (  NORCO) 10-325 MG per tablet Take 1 tablet by mouth every 6 (six) hours as needed.      . metoprolol (LOPRESSOR) 50 MG tablet Take 50 mg by mouth 2 (two) times daily.        . niacin (NIASPAN) 500 MG CR tablet Take 500 mg by mouth at bedtime.        . polyethylene glycol powder (GLYCOLAX/MIRALAX) powder Take 17 g by mouth daily.  527 g  3  . QUEtiapine (SEROQUEL) 400 MG tablet Take 800 mg by mouth at bedtime.      . tiZANidine (ZANAFLEX) 4 MG capsule Take 4 mg by mouth 3 (three) times daily.      . traZODone (DESYREL) 150 MG tablet Take 150 mg by mouth at bedtime.       . Linaclotide 290 MCG CAPS Take 1 capsule by mouth daily.  30 capsule  3   No current facility-administered medications for this visit.    Allergies as  of 02/04/2013 - Review Complete 02/04/2013  Allergen Reaction Noted  . Bee venom  04/23/2012  . Compazine  11/01/2011  . Imitrex (sumatriptan base) Nausea And Vomiting 10/18/2011  . Prochlorperazine edisylate  01/07/2008  . Risperidone  01/07/2008    Family History  Problem Relation Age of Onset  . Anesthesia problems Mother   . Colon cancer Maternal Grandfather   . Crohn's disease Maternal Grandfather     History   Social History  . Marital Status: Divorced    Spouse Name: N/A    Number of Children: N/A  . Years of Education: N/A   Social History Main Topics  . Smoking status: Current Every Day Smoker -- 1.00 packs/day for 22 years    Types: Cigarettes  . Smokeless tobacco: Never Used  . Alcohol Use: No  . Drug Use: No  . Sexually Active:    Other Topics Concern  . None   Social History Narrative  . None    Review of Systems: Negative unless otherwise mentioned in HPI.   Physical Exam: BP 115/69  Pulse 73  Temp(Src) 98.6 F (37 C) (Oral)  Ht 6' 2" (1.88 m)  Wt 330 lb (149.687 kg)  BMI 42.35 kg/m2 General:   Alert and oriented. No distress noted. Pleasant and cooperative.  Head:  Normocephalic and atraumatic. Eyes:  Conjuctiva clear without scleral icterus. Mouth:  Oral mucosa pink and moist.  Neck:  Supple, without mass or thyromegaly. Heart:  S1, S2 present without murmurs, rubs, or gallops. Regular rate and rhythm. Abdomen:  +BS, soft, significantly TTP LUQ, epigastric region, obese. Appears questionable bulge middle/left sided abdomen, ?ventral hernia Msk:  Symmetrical without gross deformities. Normal posture. Extremities:  Without edema. Neurologic:  Alert and  oriented x4;  grossly normal neurologically. Skin:  Intact without significant lesions or rashes. Cervical Nodes:  No significant cervical adenopathy. Psych:  Alert and cooperative. Normal mood and affect.  

## 2013-02-18 NOTE — Op Note (Signed)
[facility] [address] [place], [address]   ENDOSCOPY PROCEDURE REPORT  PATIENT: Rasheen, [name]  MR#: [id number] BIRTHDATE: Jul 11, 1976 , 36  yrs. old GENDER: Male ENDOSCOPIST: R.  Roetta Sessions, MD FACP FACG REFERRED BY:  Kari Baars, M.D. PROCEDURE DATE:  02/18/2013 PROCEDURE:     Diagnostic EGD (incomplete examination)  INDICATIONS:     Nausea vomiting/epigastric pain and recently reported esophageal dysphagia  INFORMED CONSENT:   The risks, benefits, limitations, alternatives and imponderables have been discussed.  The potential for biopsy, esophogeal dilation, etc. have also been reviewed.  Questions have been answered.  All parties agreeable.  Please see the history and physical in the medical record for more information.  MEDICATIONS:    Deep sedation per Dr. [facility]  DESCRIPTION OF PROCEDURE:   The     endoscope was introduced through the mouth and advanced to the second portion of the duodenum.  The mucosal surfaces were surveyed very carefully during advancement of the scope and upon withdrawal.  Retroflexion view of the proximal stomach and esophagogastric junction was performed.      FINDINGS:  normal-appearing, patent tubular esophagus. Significant retained gastric contents precluded complete examination. The entire gastric mucosa could not be seen today because of retained gastric contents. Patent pylorus. The first and second portion of the duodenum appeared normal.  THERAPEUTIC / DIAGNOSTIC MANEUVERS PERFORMED:  none   COMPLICATIONS:  None  IMPRESSION: Retained gastric contents precluded complete examination(Last intake of solid food was 11 1/2 hours prior to this procedure).  I suspect gastroparesis playing a major role in this gentleman's upper GI tract symptoms.  RECOMMENDATIONS:  Will proceed with a solid phase gastric emptying study. Further recommendations to  follow.    _______________________________ R. Roetta Sessions, MD FACP [facility] [place] [facility] eSigned:  R. Roetta Sessions, MD FACP [facility] 02/18/2013 8:14 AM     CC:

## 2013-02-18 NOTE — Anesthesia Postprocedure Evaluation (Signed)
  Anesthesia Post-op Note  Patient: [name]  Procedure(s) Performed: Procedure(s): ESOPHAGOGASTRODUODENOSCOPY (EGD) WITH PROPOFOL (N/A)  Patient Location: PACU  Anesthesia Type:MAC  Level of Consciousness: awake, alert  and patient cooperative  Airway and Oxygen Therapy: Patient Spontanous Breathing  Post-op Pain: none  Post-op Assessment: Post-op Vital signs reviewed, Patient's Cardiovascular Status Stable, PATIENT'S CARDIOVASCULAR STATUS UNSTABLE, Patent Airway and Pain level controlled  Post-op Vital Signs: Reviewed and stable  Complications: No apparent anesthesia complications

## 2013-02-18 NOTE — Interval H&P Note (Signed)
History and Physical Interval Note:  02/18/2013 7:32 AM  Hendricks Limes  has presented today for surgery, with the diagnosis of NAUSEA & VOMITING AND CONSTIPATION  The various methods of treatment have been discussed with the patient and family. After consideration of risks, benefits and other options for treatment, the patient has consented to  Procedure(s) with comments: ESOPHAGOGASTRODUODENOSCOPY (EGD) WITH PROPOFOL (N/A) - 7:30 as a surgical intervention .  The patient's history has been reviewed, patient examined, no change in status, stable for surgery.  I have reviewed the patient's chart and labs.  Questions were answered to the patient's satisfaction.     Eula Listen  Patient states constipation better with Linzess. He tells me for the past 3 months he's had esophageal dysphagia to solid food and pills, notes food "sticks" behind the breastbone from time to time". CT scan better. Doesn't remember whether or not he mentioned this in the office. EGD today per plan. We'll also esophageal  dilation as appropriate.The risks, benefits, limitations, alternatives and imponderables have been reviewed with the patient. Potential for esophageal dilation, biopsy, etc. have also been reviewed.  Questions have been answered. All parties agreeable.

## 2013-02-18 NOTE — Anesthesia Preprocedure Evaluation (Signed)
Anesthesia Evaluation  Patient identified by MRN, date of birth, ID band Patient awake    Reviewed: Allergy & Precautions, H&P , NPO status , Patient's Chart, lab work & pertinent test results, reviewed documented beta blocker date and time   History of Anesthesia Complications Negative for: history of anesthetic complications  Airway Mallampati: II TM Distance: >3 FB Neck ROM: Full    Dental  (+) Edentulous Upper, Edentulous Lower and Dental Advisory Given   Pulmonary Current Smoker,  breath sounds clear to auscultation        Cardiovascular Exercise Tolerance: Good hypertension, Pt. on medications and Pt. on home beta blockers Rhythm:Regular Rate:Normal     Neuro/Psych PSYCHIATRIC DISORDERS Anxiety Depression Bipolar Disorder Schizophrenia  Neuromuscular disease (Chronic back pain)    GI/Hepatic Neg liver ROS, hiatal hernia, GERD-  Medicated and Controlled,  Endo/Other  Morbid obesity  Renal/GU negative Renal ROS  negative genitourinary   Musculoskeletal  (+) Arthritis - (back), Osteoarthritis,    Abdominal (+) + obese,   Peds negative pediatric ROS (+)  Hematology negative hematology ROS (+)   Anesthesia Other Findings   Reproductive/Obstetrics negative OB ROS                           Anesthesia Physical Anesthesia Plan  ASA: III  Anesthesia Plan: MAC   Post-op Pain Management:    Induction: Intravenous  Airway Management Planned: Simple Face Mask  Additional Equipment:   Intra-op Plan:   Post-operative Plan:   Informed Consent: I have reviewed the patients History and Physical, chart, labs and discussed the procedure including the risks, benefits and alternatives for the proposed anesthesia with the patient or authorized representative who has indicated his/her understanding and acceptance.     Plan Discussed with:   Anesthesia Plan Comments:         Anesthesia  Quick Evaluation

## 2013-02-18 NOTE — Transfer of Care (Signed)
Immediate Anesthesia Transfer of Care Note  Patient: [name]  Procedure(s) Performed: Procedure(s): ESOPHAGOGASTRODUODENOSCOPY (EGD) WITH PROPOFOL (N/A)  Patient Location: PACU  Anesthesia Type:MAC  Level of Consciousness: sedated and patient cooperative  Airway & Oxygen Therapy: Patient Spontanous Breathing and Patient connected to face mask oxygen  Post-op Assessment: Report given to PACU RN, Post -op Vital signs reviewed and stable and Patient moving all extremities  Post vital signs: Reviewed and stable  Complications: No apparent anesthesia complications

## 2013-02-19 ENCOUNTER — Encounter (HOSPITAL_COMMUNITY): Payer: Self-pay | Admitting: Internal Medicine

## 2013-02-22 ENCOUNTER — Encounter (HOSPITAL_COMMUNITY): Payer: Medicare Other

## 2013-03-02 ENCOUNTER — Ambulatory Visit: Payer: Medicare Other | Admitting: Gastroenterology

## 2013-03-31 ENCOUNTER — Other Ambulatory Visit: Payer: Self-pay

## 2013-04-01 MED ORDER — DEXLANSOPRAZOLE 60 MG PO CPDR: 60.0000 mg | DELAYED_RELEASE_CAPSULE | Freq: Every day | ORAL | Status: DC

## 2013-06-08 ENCOUNTER — Other Ambulatory Visit: Payer: Self-pay | Admitting: Gastroenterology

## 2016-05-28 DIAGNOSIS — F209 Schizophrenia, unspecified: Secondary | ICD-10-CM | POA: Insufficient documentation

## 2016-06-04 DIAGNOSIS — F209 Schizophrenia, unspecified: Secondary | ICD-10-CM | POA: Diagnosis not present

## 2016-06-10 DIAGNOSIS — S53401A Unspecified sprain of right elbow, initial encounter: Secondary | ICD-10-CM | POA: Diagnosis not present

## 2016-06-21 ENCOUNTER — Emergency Department (HOSPITAL_COMMUNITY): Payer: Medicaid Other

## 2016-06-21 ENCOUNTER — Encounter (HOSPITAL_COMMUNITY): Payer: Self-pay | Admitting: Cardiology

## 2016-06-21 ENCOUNTER — Emergency Department (HOSPITAL_COMMUNITY)
Admission: EM | Admit: 2016-06-21 | Discharge: 2016-06-21 | Disposition: A | Discharge status: Home / Self Care | Payer: Medicaid Other | Attending: Emergency Medicine | Admitting: Emergency Medicine

## 2016-06-21 DIAGNOSIS — R0789 Other chest pain: Secondary | ICD-10-CM | POA: Insufficient documentation

## 2016-06-21 DIAGNOSIS — Z79899 Other long term (current) drug therapy: Secondary | ICD-10-CM | POA: Insufficient documentation

## 2016-06-21 DIAGNOSIS — J45909 Unspecified asthma, uncomplicated: Secondary | ICD-10-CM | POA: Diagnosis not present

## 2016-06-21 DIAGNOSIS — Z7951 Long term (current) use of inhaled steroids: Secondary | ICD-10-CM | POA: Insufficient documentation

## 2016-06-21 DIAGNOSIS — F419 Anxiety disorder, unspecified: Secondary | ICD-10-CM | POA: Insufficient documentation

## 2016-06-21 DIAGNOSIS — I10 Essential (primary) hypertension: Secondary | ICD-10-CM | POA: Diagnosis not present

## 2016-06-21 DIAGNOSIS — F17210 Nicotine dependence, cigarettes, uncomplicated: Secondary | ICD-10-CM | POA: Diagnosis not present

## 2016-06-21 LAB — BASIC METABOLIC PANEL
Anion gap: 9 (ref 5–15)
BUN: 8 mg/dL (ref 6–20)
CALCIUM: 8.8 mg/dL — AB (ref 8.9–10.3)
CO2: 22 mmol/L (ref 22–32)
CREATININE: 0.93 mg/dL (ref 0.61–1.24)
Chloride: 105 mmol/L (ref 101–111)
GFR calc Af Amer: >60 mL/min (ref >60)
GLUCOSE: 95 mg/dL (ref 65–99)
Potassium: 3.5 mmol/L (ref 3.5–5.1)
Sodium: 136 mmol/L (ref 135–145)

## 2016-06-21 LAB — CBC
HCT: 40.7 % (ref 39.0–52.0)
HEMOGLOBIN: 14 g/dL (ref 13.0–17.0)
MCH: 29.4 pg (ref 26.0–34.0)
MCHC: 34.4 g/dL (ref 30.0–36.0)
MCV: 85.5 fL (ref 78.0–100.0)
Platelets: 219 10*3/uL (ref 150–400)
RBC: 4.76 MIL/uL (ref 4.22–5.81)
RDW: 13.5 % (ref 11.5–15.5)
WBC: 9 10*3/uL (ref 4.0–10.5)

## 2016-06-21 LAB — TROPONIN I

## 2016-06-21 MED ORDER — GI COCKTAIL ~~LOC~~
30.0000 mL | Freq: Once | ORAL | Status: AC
  Administered 2016-06-21: 30 mL via ORAL
  Filled 2016-06-21: qty 30

## 2016-06-21 MED ORDER — PROMETHAZINE HCL 25 MG/ML IJ SOLN
25.0000 mg | Freq: Once | INTRAMUSCULAR | Status: AC
  Administered 2016-06-21: 25 mg via INTRAVENOUS
  Filled 2016-06-21: qty 1

## 2016-06-21 MED ORDER — LORAZEPAM 2 MG/ML IJ SOLN
1.0000 mg | Freq: Once | INTRAMUSCULAR | Status: AC
Start: 2016-06-21 — End: 2016-06-21
  Administered 2016-06-21: 1 mg via INTRAVENOUS
  Filled 2016-06-21: qty 1

## 2016-06-21 MED ORDER — FAMOTIDINE 20 MG PO TABS
20.0000 mg | ORAL_TABLET | Freq: Once | ORAL | Status: AC
  Administered 2016-06-21: 20 mg via ORAL
  Filled 2016-06-21: qty 1

## 2016-06-21 NOTE — ED Notes (Signed)
Pt back from x-ray.

## 2016-06-21 NOTE — Discharge Instructions (Signed)
It was our pleasure to provide your ER care today - we hope that you feel better.  Follow up with your doctor in the coming week - call office Monday for appointment.  You were given medication in the ER that causes drowsiness - no driving for the next 4 hours.

## 2016-06-21 NOTE — ED Notes (Signed)
Pt pt call sister, Kathi Ludwig [phone], per sister, their Mother is on the way.

## 2016-06-21 NOTE — ED Triage Notes (Signed)
Chest pain that radiates down left arm times 1min.  VSS stable with EMS. EKG NSR with EMS.

## 2016-06-21 NOTE — ED Notes (Signed)
Pt ambulatory to the bathroom, waiting results

## 2016-06-21 NOTE — ED Provider Notes (Signed)
[facility] DEPT Provider Note   CSN: [id number] Arrival date & time: 06/21/16  1606  First Provider Contact:  First MD Initiated Contact with Patient 06/21/16 1658        History   Chief Complaint Chief Complaint  Patient presents with  . Chest Pain    HPI [name] is a 40 y.o. male.  Patient w hx anxiety, schizoaffective disorder, htn, c/o mid chest pain at rest earlier today. Pain constant, dull, midline, lower chest. No radiation. Nausea. No vomiting. No diaphoresis or sob. No pleuritic pain. No other recent exertional cp or discomfort of any sort. States father with heart disease in his 48s. No cough or uri c/o. No fever or chills. No chest wall injury or strain. Denies leg pain or swelling. No recent surgery, immobility, trauma or travel. +smoker.    The history is provided by the patient.  Chest Pain   Associated symptoms include nausea. Pertinent negatives include no abdominal pain, no back pain, no fever, no headaches, no shortness of breath and no vomiting.    Past Medical History:  Diagnosis Date  . Anxiety   . Asthma   . Bipolar 1 disorder ([place])   . Complication of anesthesia    Anxiety when he awaken with ET tube still in place  . GERD (gastroesophageal reflux disease)   . Hyperlipidemia   . Hypertension   . Hypoventilation syndrome   . Schizoaffective disorder ([place])    [place]    Patient Active Problem List   Diagnosis Date Noted  . Abdominal pain, epigastric 02/04/2013  . Constipation 12/02/2012  . Abdominal pain 11/22/2012  . INSOMNIA 04/26/2009  . BACK PAIN, LUMBAR 03/20/2009  . BIPOLAR AFFECTIVE DISORDER 02/17/2009  . OTHER ENTHESOPATHY OF ANKLE AND TARSUS 10/05/2008  . GERD 09/16/2008  . PERSONAL HISTORY OF SCHIZOPHRENIA 05/27/2008  . ESOPHAGITIS, REFLUX 03/23/2008  . HIATAL HERNIA 03/23/2008  . HYPERLIPIDEMIA 01/07/2008  . MORBID OBESITY 01/07/2008  . ANXIETY DEPRESSION 01/07/2008  . TOBACCO ABUSE 01/07/2008  .  HYPERTENSION 01/07/2008  . ALLERGIC RHINITIS 01/07/2008  . IBS 01/07/2008    Past Surgical History:  Procedure Laterality Date  . ANKLE SURGERY    . APPENDECTOMY    . CHOLECYSTECTOMY    . ESOPHAGOGASTRODUODENOSCOPY  03/17/2008   RMR: A tiny distal esophageal erosions consistent with mild  erosive reflux esophagitis, otherwise normal esophagus, small hiatal hernia, minimally polypoid antral mucosa with doubtful clinical was significance.  Otherwise, normal stomach, patent pylorus, and normal D1-D2  . ESOPHAGOGASTRODUODENOSCOPY (EGD) WITH PROPOFOL N/A 02/18/2013   Procedure: ESOPHAGOGASTRODUODENOSCOPY (EGD) WITH PROPOFOL;  Surgeon: Daneil Dolin, MD;  Location: [facility];  Service: Endoscopy;  Laterality: N/A;  . NASAL SINUS SURGERY  01/24/2012   Procedure: ENDOSCOPIC SINUS SURGERY;  Surgeon: Izora Gala, MD;  Location: [facility];  Service: ENT;  Laterality: Left;  left ethmoidectomy, maxillary, frontal  . UMBILICAL HERNIA REPAIR         Home Medications    Prior to Admission medications   Medication Sig Start Date End Date Taking? Authorizing Provider  albuterol (PROVENTIL HFA;VENTOLIN HFA) 108 (90 BASE) MCG/ACT inhaler Inhale 1-2 puffs into the lungs every 6 (six) hours as needed for wheezing. 04/21/12 04/21/13  Prentiss Bells, MD  benztropine (COGENTIN) 0.5 MG tablet Take 1 mg by mouth at bedtime.     Historical Provider, MD  cetirizine (ZYRTEC) 10 MG tablet Take 10 mg by mouth every morning.    Historical Provider, MD  Choline Fenofibrate 135  MG capsule Take 135 mg by mouth every morning.    Historical Provider, MD  citalopram (CELEXA) 20 MG tablet Take 20 mg by mouth every morning.    Historical Provider, MD  dexlansoprazole (DEXILANT) 60 MG capsule Take 1 capsule (60 mg total) by mouth daily. 12/02/12   Orvil Feil, NP  dexlansoprazole (DEXILANT) 60 MG capsule Take 1 capsule (60 mg total) by mouth daily. 03/31/13   Orvil Feil, NP  diazepam (VALIUM) 5 MG tablet Take 10 mg by mouth 3 (three)  times daily as needed for anxiety. For anxiety    Historical Provider, MD  doxepin (SINEQUAN) 150 MG capsule Take 150 mg by mouth at bedtime.    Historical Provider, MD  EPINEPHrine (EPIPEN 2-PAK IJ) Inject as directed.    Historical Provider, MD  gabapentin (NEURONTIN) 400 MG capsule Take 400 mg by mouth 2 (two) times daily.    Historical Provider, MD  gabapentin (NEURONTIN) 800 MG tablet Take 800 mg by mouth at bedtime.     Historical Provider, MD  haloperidol decanoate (HALDOL DECANOATE) 100 MG/ML injection Inject 150 mg into the muscle every 28 (twenty-eight) days.    Historical Provider, MD  HYDROcodone-acetaminophen (NORCO) 10-325 MG per tablet Take 1 tablet by mouth every 6 (six) hours as needed for pain.     Historical Provider, MD  LINZESS 290 MCG CAPS TAKE 1 CAPSULE BY MOUTH ONCE DAILY 06/08/13   Orvil Feil, NP  metoprolol (LOPRESSOR) 50 MG tablet Take 50 mg by mouth 2 (two) times daily.      Historical Provider, MD  niacin (NIASPAN) 500 MG CR tablet Take 500 mg by mouth at bedtime.      Historical Provider, MD  oxyCODONE (ROXICODONE) 15 MG immediate release tablet Take 15 mg by mouth 4 (four) times daily.    Historical Provider, MD  polyethylene glycol powder (GLYCOLAX/MIRALAX) powder Take 17 g by mouth daily. 12/02/12   Orvil Feil, NP  QUEtiapine (SEROQUEL) 100 MG tablet Take 100 mg by mouth 2 (two) times daily.    Historical Provider, MD  tiZANidine (ZANAFLEX) 4 MG capsule Take 4 mg by mouth 3 (three) times daily.    Historical Provider, MD  traZODone (DESYREL) 150 MG tablet Take 300 mg by mouth at bedtime.     Historical Provider, MD    Family History Family History  Problem Relation Age of Onset  . Anesthesia problems Mother   . Colon cancer Maternal Grandfather   . Crohn's disease Maternal Grandfather     Social History Social History  Substance Use Topics  . Smoking status: Current Every Day Smoker    Packs/day: 1.00    Years: 22.00    Types: Cigarettes  . Smokeless  tobacco: Never Used  . Alcohol use No     Allergies   Bee venom; Compazine; Imitrex [sumatriptan base]; Prochlorperazine edisylate; and Risperidone   Review of Systems Review of Systems  Constitutional: Negative for fever.  HENT: Negative for sore throat.   Eyes: Negative for redness.  Respiratory: Negative for shortness of breath.   Cardiovascular: Positive for chest pain. Negative for leg swelling.  Gastrointestinal: Positive for nausea. Negative for abdominal pain and vomiting.  Genitourinary: Negative for flank pain.  Musculoskeletal: Negative for back pain and neck pain.  Skin: Negative for rash.  Neurological: Negative for syncope and headaches.  Hematological: Does not bruise/bleed easily.  Psychiatric/Behavioral: Negative for confusion. The patient is nervous/anxious.      Physical Exam Updated Vital  Signs BP 135/63 (BP Location: Right Arm)   Pulse 80   Temp 98.6 F (37 C) (Oral)   Resp 18   SpO2 96%   Physical Exam  Constitutional: He appears well-developed and well-nourished. No distress.  HENT:  Mouth/Throat: Oropharynx is clear and moist.  Eyes: Pupils are equal, round, and reactive to light.  Neck: Neck supple. No tracheal deviation present.  Cardiovascular: Normal rate, regular rhythm, normal heart sounds and intact distal pulses.  Exam reveals no gallop and no friction rub.   No murmur heard. Pulmonary/Chest: Effort normal and breath sounds normal. No accessory muscle usage. No respiratory distress. He exhibits no tenderness.  Abdominal: Soft. He exhibits no distension. There is no tenderness.  Musculoskeletal: Normal range of motion. He exhibits no edema or tenderness.  Neurological: He is alert.  Skin: Skin is warm and dry. He is not diaphoretic.  Psychiatric:  Anxious appearing.   Nursing note and vitals reviewed.    ED Treatments / Results  Labs (all labs ordered are listed, but only abnormal results are displayed) Results for orders placed  or performed during the hospital encounter of [id number]  Basic metabolic panel  Result Value Ref Range   Sodium 136 135 - 145 mmol/L   Potassium 3.5 3.5 - 5.1 mmol/L   Chloride 105 101 - 111 mmol/L   CO2 22 22 - 32 mmol/L   Glucose, Bld 95 65 - 99 mg/dL   BUN 8 6 - 20 mg/dL   Creatinine, Ser 0.93 0.61 - 1.24 mg/dL   Calcium 8.8 (L) 8.9 - 10.3 mg/dL   GFR calc non Af Amer >60 >60 mL/min   GFR calc Af Amer >60 >60 mL/min   Anion gap 9 5 - 15  CBC  Result Value Ref Range   WBC 9.0 4.0 - 10.5 K/uL   RBC 4.76 4.22 - 5.81 MIL/uL   Hemoglobin 14.0 13.0 - 17.0 g/dL   HCT 40.7 39.0 - 52.0 %   MCV 85.5 78.0 - 100.0 fL   MCH 29.4 26.0 - 34.0 pg   MCHC 34.4 30.0 - 36.0 g/dL   RDW 13.5 11.5 - 15.5 %   Platelets 219 150 - 400 K/uL  Troponin I  Result Value Ref Range   Troponin I <0.03 <0.03 ng/mL  Troponin I  Result Value Ref Range   Troponin I <0.03 <0.03 ng/mL   Dg Chest 2 View  Result Date: 06/21/2016 CLINICAL DATA:  Initial encounter. 40 y/o male with c/o LEFT CP that radiates to LEFT arm and back onset 2 hours ago, also experiencing some nausea. Hx GERD, current smoker. EXAM: CHEST  2 VIEW COMPARISON:  12/11/2012 FINDINGS: Heart size is normal. Lungs are free of focal consolidations. There is mild bronchitic change. No pulmonary edema. Small bilateral pleural effusions are present. IMPRESSION: 1. Bronchitic changes. 2. Small effusions. Electronically Signed   By: Nolon Nations M.D.   On: 06/21/2016 17:16    EKG  EKG Interpretation  Date/Time:  Friday June 21 2016 16:12:15 EDT Ventricular Rate:  84 PR Interval:  150 QRS Duration: 84 QT Interval:  410 QTC Calculation: 484 R Axis:   53 Text Interpretation:  Normal sinus rhythm Prolonged QT Nonspecific ST abnormality No significant change since last tracing Confirmed by Ashok Cordia  MD, Lennette Bihari ([id number]) on 06/21/2016 4:23:30 PM       Radiology Dg Chest 2 View  Result Date: 06/21/2016 CLINICAL DATA:  Initial encounter. 40 y/o male  with c/o LEFT CP that radiates  to LEFT arm and back onset 2 hours ago, also experiencing some nausea. Hx GERD, current smoker. EXAM: CHEST  2 VIEW COMPARISON:  12/11/2012 FINDINGS: Heart size is normal. Lungs are free of focal consolidations. There is mild bronchitic change. No pulmonary edema. Small bilateral pleural effusions are present. IMPRESSION: 1. Bronchitic changes. 2. Small effusions. Electronically Signed   By: Nolon Nations M.D.   On: 06/21/2016 17:16    Procedures Procedures (including critical care time)  Medications Ordered in ED Medications  promethazine (PHENERGAN) injection 25 mg (not administered)  gi cocktail (Maalox,Lidocaine,Donnatal) (not administered)  famotidine (PEPCID) tablet 20 mg (not administered)     Initial Impression / Assessment and Plan / ED Course  I have reviewed the triage vital signs and the nursing notes.  Pertinent labs & imaging results that were available during my care of the patient were reviewed by me and considered in my medical decision making (see chart for details).  Clinical Course   Iv ns. Labs. Cxr.  Patient requests phenergan for nausea - stating has gotten in past with good relief of symptoms.  Will also try pepcid and gi cocktail for symptom relief.   Reviewed nursing notes and prior charts for additional history.   Recheck, no pain. Pt now asking for ativan for anxiety. Ativan 1 mg.  Recheck pt, requests d/c, states feels fine, no cp.   Repeat trop normal.  Patient currently appears stable for d/c.     Final Clinical Impressions(s) / ED Diagnoses   Final diagnoses:  None    New Prescriptions New Prescriptions   No medications on file     Lajean Saver, MD 06/21/16 2000

## 2016-07-01 DIAGNOSIS — F209 Schizophrenia, unspecified: Secondary | ICD-10-CM | POA: Diagnosis not present

## 2016-07-02 DIAGNOSIS — M545 Low back pain: Secondary | ICD-10-CM | POA: Diagnosis not present

## 2016-07-02 DIAGNOSIS — F419 Anxiety disorder, unspecified: Secondary | ICD-10-CM | POA: Diagnosis not present

## 2016-07-02 DIAGNOSIS — I10 Essential (primary) hypertension: Secondary | ICD-10-CM | POA: Diagnosis not present

## 2016-07-02 DIAGNOSIS — M722 Plantar fascial fibromatosis: Secondary | ICD-10-CM | POA: Diagnosis not present

## 2016-07-02 DIAGNOSIS — R079 Chest pain, unspecified: Secondary | ICD-10-CM | POA: Diagnosis not present

## 2016-08-09 DIAGNOSIS — R310 Gross hematuria: Secondary | ICD-10-CM | POA: Diagnosis not present

## 2016-08-09 DIAGNOSIS — Z23 Encounter for immunization: Secondary | ICD-10-CM | POA: Diagnosis not present

## 2016-08-09 DIAGNOSIS — B079 Viral wart, unspecified: Secondary | ICD-10-CM | POA: Diagnosis not present

## 2016-08-20 DIAGNOSIS — M545 Low back pain: Secondary | ICD-10-CM | POA: Diagnosis not present

## 2016-08-20 DIAGNOSIS — K529 Noninfective gastroenteritis and colitis, unspecified: Secondary | ICD-10-CM | POA: Diagnosis not present

## 2016-08-20 DIAGNOSIS — N200 Calculus of kidney: Secondary | ICD-10-CM | POA: Diagnosis not present

## 2016-08-27 DIAGNOSIS — F209 Schizophrenia, unspecified: Secondary | ICD-10-CM | POA: Diagnosis not present

## 2016-08-28 DIAGNOSIS — M545 Low back pain: Secondary | ICD-10-CM | POA: Diagnosis not present

## 2016-08-28 DIAGNOSIS — I10 Essential (primary) hypertension: Secondary | ICD-10-CM | POA: Diagnosis not present

## 2016-08-28 DIAGNOSIS — F419 Anxiety disorder, unspecified: Secondary | ICD-10-CM | POA: Diagnosis not present

## 2016-08-28 DIAGNOSIS — R079 Chest pain, unspecified: Secondary | ICD-10-CM | POA: Diagnosis not present

## 2016-09-02 DIAGNOSIS — G43909 Migraine, unspecified, not intractable, without status migrainosus: Secondary | ICD-10-CM | POA: Diagnosis not present

## 2016-09-02 DIAGNOSIS — M545 Low back pain: Secondary | ICD-10-CM | POA: Diagnosis not present

## 2016-09-02 DIAGNOSIS — J449 Chronic obstructive pulmonary disease, unspecified: Secondary | ICD-10-CM | POA: Diagnosis not present

## 2016-09-02 DIAGNOSIS — I10 Essential (primary) hypertension: Secondary | ICD-10-CM | POA: Diagnosis not present

## 2016-09-11 DIAGNOSIS — I10 Essential (primary) hypertension: Secondary | ICD-10-CM | POA: Diagnosis not present

## 2016-09-11 DIAGNOSIS — M62838 Other muscle spasm: Secondary | ICD-10-CM | POA: Diagnosis not present

## 2016-09-12 DIAGNOSIS — D485 Neoplasm of uncertain behavior of skin: Secondary | ICD-10-CM | POA: Diagnosis not present

## 2016-09-12 DIAGNOSIS — D3612 Benign neoplasm of peripheral nerves and autonomic nervous system, upper limb, including shoulder: Secondary | ICD-10-CM | POA: Diagnosis not present

## 2016-09-12 DIAGNOSIS — L02219 Cutaneous abscess of trunk, unspecified: Secondary | ICD-10-CM | POA: Diagnosis not present

## 2016-09-12 DIAGNOSIS — B079 Viral wart, unspecified: Secondary | ICD-10-CM | POA: Diagnosis not present

## 2016-11-12 DIAGNOSIS — G2581 Restless legs syndrome: Secondary | ICD-10-CM | POA: Diagnosis not present

## 2016-11-12 DIAGNOSIS — M545 Low back pain: Secondary | ICD-10-CM | POA: Diagnosis not present

## 2016-11-12 DIAGNOSIS — I10 Essential (primary) hypertension: Secondary | ICD-10-CM | POA: Diagnosis not present

## 2016-12-02 DIAGNOSIS — R112 Nausea with vomiting, unspecified: Secondary | ICD-10-CM | POA: Diagnosis not present

## 2016-12-02 DIAGNOSIS — R197 Diarrhea, unspecified: Secondary | ICD-10-CM | POA: Diagnosis not present

## 2016-12-02 DIAGNOSIS — Z79899 Other long term (current) drug therapy: Secondary | ICD-10-CM | POA: Diagnosis not present

## 2016-12-02 DIAGNOSIS — I10 Essential (primary) hypertension: Secondary | ICD-10-CM | POA: Diagnosis not present

## 2016-12-03 DIAGNOSIS — M545 Low back pain: Secondary | ICD-10-CM | POA: Diagnosis not present

## 2016-12-03 DIAGNOSIS — G2581 Restless legs syndrome: Secondary | ICD-10-CM | POA: Diagnosis not present

## 2016-12-03 DIAGNOSIS — I10 Essential (primary) hypertension: Secondary | ICD-10-CM | POA: Diagnosis not present

## 2016-12-30 DIAGNOSIS — Z79899 Other long term (current) drug therapy: Secondary | ICD-10-CM | POA: Diagnosis not present

## 2016-12-30 DIAGNOSIS — I10 Essential (primary) hypertension: Secondary | ICD-10-CM | POA: Diagnosis not present

## 2016-12-30 DIAGNOSIS — G4700 Insomnia, unspecified: Secondary | ICD-10-CM | POA: Diagnosis not present

## 2017-01-23 DIAGNOSIS — I10 Essential (primary) hypertension: Secondary | ICD-10-CM | POA: Diagnosis not present

## 2017-01-23 DIAGNOSIS — Z79899 Other long term (current) drug therapy: Secondary | ICD-10-CM | POA: Diagnosis not present

## 2017-01-23 DIAGNOSIS — E7800 Pure hypercholesterolemia, unspecified: Secondary | ICD-10-CM | POA: Diagnosis not present

## 2017-01-23 DIAGNOSIS — R0602 Shortness of breath: Secondary | ICD-10-CM | POA: Diagnosis not present

## 2017-01-23 DIAGNOSIS — R064 Hyperventilation: Secondary | ICD-10-CM | POA: Diagnosis not present

## 2017-01-23 DIAGNOSIS — R079 Chest pain, unspecified: Secondary | ICD-10-CM | POA: Diagnosis not present

## 2017-01-23 DIAGNOSIS — R0789 Other chest pain: Secondary | ICD-10-CM | POA: Diagnosis not present

## 2017-01-31 ENCOUNTER — Other Ambulatory Visit: Payer: Self-pay | Admitting: Pulmonary Disease

## 2017-01-31 ENCOUNTER — Other Ambulatory Visit (HOSPITAL_COMMUNITY): Payer: Self-pay | Admitting: Respiratory Therapy

## 2017-01-31 DIAGNOSIS — I10 Essential (primary) hypertension: Secondary | ICD-10-CM | POA: Diagnosis not present

## 2017-01-31 DIAGNOSIS — R0602 Shortness of breath: Secondary | ICD-10-CM

## 2017-01-31 DIAGNOSIS — R079 Chest pain, unspecified: Secondary | ICD-10-CM | POA: Diagnosis not present

## 2017-01-31 DIAGNOSIS — M545 Low back pain: Secondary | ICD-10-CM

## 2017-02-05 ENCOUNTER — Ambulatory Visit (HOSPITAL_COMMUNITY)
Admission: RE | Admit: 2017-02-05 | Discharge: 2017-02-05 | Disposition: A | Discharge status: Home / Self Care | Payer: Medicare Other | Source: Ambulatory Visit | Attending: Pulmonary Disease | Admitting: Pulmonary Disease

## 2017-02-05 DIAGNOSIS — R942 Abnormal results of pulmonary function studies: Secondary | ICD-10-CM | POA: Diagnosis not present

## 2017-02-05 DIAGNOSIS — R0602 Shortness of breath: Secondary | ICD-10-CM | POA: Diagnosis not present

## 2017-02-05 MED ORDER — ALBUTEROL SULFATE (2.5 MG/3ML) 0.083% IN NEBU
2.5000 mg | INHALATION_SOLUTION | Freq: Once | RESPIRATORY_TRACT | Status: AC
  Administered 2017-02-05: 2.5 mg via RESPIRATORY_TRACT

## 2017-02-06 LAB — PULMONARY FUNCTION TEST
DL/VA % PRED: 110 %
DL/VA: 5.4 ml/min/mmHg/L
DLCO cor % pred: 68 %
DLCO cor: 26.01 ml/min/mmHg
DLCO unc % pred: 68 %
DLCO unc: 26.01 ml/min/mmHg
FEF 25-75 Post: 4.97 L/sec
FEF 25-75 Pre: 3.53 L/sec
FEF2575-%CHANGE-POST: 40 %
FEF2575-%PRED-POST: 115 %
FEF2575-%Pred-Pre: 82 %
FEV1-%CHANGE-POST: 9 %
FEV1-%Pred-Post: 86 %
FEV1-%Pred-Pre: 78 %
FEV1-PRE: 3.72 L
FEV1-Post: 4.08 L
FEV1FVC-%CHANGE-POST: 4 %
FEV1FVC-%Pred-Pre: 99 %
FEV6-%Change-Post: 4 %
FEV6-%PRED-PRE: 79 %
FEV6-%Pred-Post: 83 %
FEV6-Post: 4.87 L
FEV6-Pre: 4.65 L
FEV6FVC-%Change-Post: 0 %
FEV6FVC-%PRED-PRE: 103 %
FEV6FVC-%Pred-Post: 103 %
FVC-%CHANGE-POST: 4 %
FVC-%PRED-POST: 81 %
FVC-%Pred-Pre: 77 %
FVC-POST: 4.87 L
FVC-Pre: 4.65 L
POST FEV1/FVC RATIO: 84 %
POST FEV6/FVC RATIO: 100 %
PRE FEV6/FVC RATIO: 100 %
Pre FEV1/FVC ratio: 80 %
RV % PRED: 164 %
RV: 3.4 L
TLC % pred: 80 %
TLC: 6.2 L

## 2017-02-11 DIAGNOSIS — H40011 Open angle with borderline findings, low risk, right eye: Secondary | ICD-10-CM | POA: Diagnosis not present

## 2017-02-11 DIAGNOSIS — H40053 Ocular hypertension, bilateral: Secondary | ICD-10-CM | POA: Diagnosis not present

## 2017-02-11 DIAGNOSIS — H16143 Punctate keratitis, bilateral: Secondary | ICD-10-CM | POA: Diagnosis not present

## 2017-02-18 DIAGNOSIS — H40013 Open angle with borderline findings, low risk, bilateral: Secondary | ICD-10-CM | POA: Diagnosis not present

## 2017-03-04 DIAGNOSIS — M62838 Other muscle spasm: Secondary | ICD-10-CM | POA: Diagnosis not present

## 2017-03-29 DIAGNOSIS — I10 Essential (primary) hypertension: Secondary | ICD-10-CM | POA: Diagnosis not present

## 2017-03-29 DIAGNOSIS — M25561 Pain in right knee: Secondary | ICD-10-CM | POA: Diagnosis not present

## 2017-03-29 DIAGNOSIS — E7800 Pure hypercholesterolemia, unspecified: Secondary | ICD-10-CM | POA: Diagnosis not present

## 2017-03-29 DIAGNOSIS — Z79899 Other long term (current) drug therapy: Secondary | ICD-10-CM | POA: Diagnosis not present

## 2017-03-29 DIAGNOSIS — M25461 Effusion, right knee: Secondary | ICD-10-CM | POA: Diagnosis not present

## 2017-04-02 DIAGNOSIS — M25561 Pain in right knee: Secondary | ICD-10-CM | POA: Diagnosis not present

## 2017-04-04 DIAGNOSIS — M545 Low back pain: Secondary | ICD-10-CM | POA: Diagnosis not present

## 2017-04-04 DIAGNOSIS — E785 Hyperlipidemia, unspecified: Secondary | ICD-10-CM | POA: Diagnosis not present

## 2017-04-04 DIAGNOSIS — F209 Schizophrenia, unspecified: Secondary | ICD-10-CM | POA: Diagnosis not present

## 2017-04-04 DIAGNOSIS — I10 Essential (primary) hypertension: Secondary | ICD-10-CM | POA: Diagnosis not present

## 2017-04-11 DIAGNOSIS — I10 Essential (primary) hypertension: Secondary | ICD-10-CM | POA: Diagnosis not present

## 2017-04-11 DIAGNOSIS — E785 Hyperlipidemia, unspecified: Secondary | ICD-10-CM | POA: Diagnosis not present

## 2017-04-11 DIAGNOSIS — M545 Low back pain: Secondary | ICD-10-CM | POA: Diagnosis not present

## 2017-05-23 DIAGNOSIS — Z79899 Other long term (current) drug therapy: Secondary | ICD-10-CM | POA: Diagnosis not present

## 2017-05-23 DIAGNOSIS — R109 Unspecified abdominal pain: Secondary | ICD-10-CM | POA: Diagnosis not present

## 2017-05-23 DIAGNOSIS — T2124XA Burn of second degree of lower back, initial encounter: Secondary | ICD-10-CM | POA: Diagnosis not present

## 2017-05-23 DIAGNOSIS — E7800 Pure hypercholesterolemia, unspecified: Secondary | ICD-10-CM | POA: Diagnosis not present

## 2017-05-23 DIAGNOSIS — I10 Essential (primary) hypertension: Secondary | ICD-10-CM | POA: Diagnosis not present

## 2017-05-23 DIAGNOSIS — G8929 Other chronic pain: Secondary | ICD-10-CM | POA: Diagnosis not present

## 2017-05-23 DIAGNOSIS — T2123XA Burn of second degree of upper back, initial encounter: Secondary | ICD-10-CM | POA: Diagnosis not present

## 2017-05-23 DIAGNOSIS — R1110 Vomiting, unspecified: Secondary | ICD-10-CM | POA: Diagnosis not present

## 2017-05-23 DIAGNOSIS — M549 Dorsalgia, unspecified: Secondary | ICD-10-CM | POA: Diagnosis not present

## 2017-05-23 DIAGNOSIS — R112 Nausea with vomiting, unspecified: Secondary | ICD-10-CM | POA: Diagnosis not present

## 2017-05-23 DIAGNOSIS — R1011 Right upper quadrant pain: Secondary | ICD-10-CM | POA: Diagnosis not present

## 2017-05-26 DIAGNOSIS — E785 Hyperlipidemia, unspecified: Secondary | ICD-10-CM | POA: Diagnosis not present

## 2017-05-26 DIAGNOSIS — M545 Low back pain: Secondary | ICD-10-CM | POA: Diagnosis not present

## 2017-05-26 DIAGNOSIS — K210 Gastro-esophageal reflux disease with esophagitis: Secondary | ICD-10-CM | POA: Diagnosis not present

## 2017-05-26 DIAGNOSIS — R945 Abnormal results of liver function studies: Secondary | ICD-10-CM | POA: Diagnosis not present

## 2017-06-09 DIAGNOSIS — K219 Gastro-esophageal reflux disease without esophagitis: Secondary | ICD-10-CM | POA: Diagnosis not present

## 2017-06-09 DIAGNOSIS — E785 Hyperlipidemia, unspecified: Secondary | ICD-10-CM | POA: Diagnosis not present

## 2017-06-09 DIAGNOSIS — M545 Low back pain: Secondary | ICD-10-CM | POA: Diagnosis not present

## 2017-06-11 DIAGNOSIS — M545 Low back pain: Secondary | ICD-10-CM | POA: Diagnosis not present

## 2017-06-11 DIAGNOSIS — R945 Abnormal results of liver function studies: Secondary | ICD-10-CM | POA: Diagnosis not present

## 2017-06-11 DIAGNOSIS — K210 Gastro-esophageal reflux disease with esophagitis: Secondary | ICD-10-CM | POA: Diagnosis not present

## 2017-06-12 DIAGNOSIS — Z5181 Encounter for therapeutic drug level monitoring: Secondary | ICD-10-CM | POA: Diagnosis not present

## 2017-07-10 DIAGNOSIS — Z5181 Encounter for therapeutic drug level monitoring: Secondary | ICD-10-CM | POA: Diagnosis not present

## 2017-07-15 ENCOUNTER — Encounter: Payer: Self-pay | Admitting: Internal Medicine

## 2017-07-15 DIAGNOSIS — I10 Essential (primary) hypertension: Secondary | ICD-10-CM | POA: Diagnosis not present

## 2017-07-15 DIAGNOSIS — M545 Low back pain: Secondary | ICD-10-CM | POA: Diagnosis not present

## 2017-07-15 DIAGNOSIS — K210 Gastro-esophageal reflux disease with esophagitis: Secondary | ICD-10-CM | POA: Diagnosis not present

## 2017-07-15 DIAGNOSIS — Z5181 Encounter for therapeutic drug level monitoring: Secondary | ICD-10-CM | POA: Diagnosis not present

## 2017-07-15 DIAGNOSIS — Z23 Encounter for immunization: Secondary | ICD-10-CM | POA: Diagnosis not present

## 2017-07-17 DIAGNOSIS — Z5181 Encounter for therapeutic drug level monitoring: Secondary | ICD-10-CM | POA: Diagnosis not present

## 2017-07-18 NOTE — Progress Notes (Deleted)
Psychiatric Initial Adult Assessment   Patient Identification: [name] MRN:  [id number] Date of Evaluation:  07/18/2017 Referral Source: *** Chief Complaint:   Visit Diagnosis: No diagnosis found.  History of Present Illness:   [name] is a 41 year old male with schizoaffective disorder per chart, asthma, hypertension, hyperlipidemia,   Per care everywhere. Patient was transferred to [name] from [name] in 2002 (details unknown)  Associated Signs/Symptoms: Depression Symptoms:  {DEPRESSION SYMPTOMS:20000} (Hypo) Manic Symptoms:  {BHH MANIC SYMPTOMS:22872} Anxiety Symptoms:  {BHH ANXIETY SYMPTOMS:22873} Psychotic Symptoms:  {BHH PSYCHOTIC SYMPTOMS:22874} PTSD Symptoms: {BHH PTSD SYMPTOMS:22875}  Past Psychiatric History:  Outpatient:  Psychiatry admission:  Previous suicide attempt:  Past trials of medication:  History of violence:   Previous Psychotropic Medications: {YES/NO:21197}  Substance Abuse History in the last 12 months:  {yes no:314532}  Consequences of Substance Abuse: {BHH CONSEQUENCES OF SUBSTANCE ABUSE:22880}  Past Medical History:  Past Medical History:  Diagnosis Date  . Anxiety   . Asthma   . Bipolar 1 disorder ([place])   . Complication of anesthesia    Anxiety when he awaken with ET tube still in place  . GERD (gastroesophageal reflux disease)   . Hyperlipidemia   . Hypertension   . Hypoventilation syndrome   . Schizoaffective disorder ([name])    [place]    Past Surgical History:  Procedure Laterality Date  . ANKLE SURGERY    . APPENDECTOMY    . CHOLECYSTECTOMY    . ESOPHAGOGASTRODUODENOSCOPY  03/17/2008   RMR: A tiny distal esophageal erosions consistent with mild  erosive reflux esophagitis, otherwise normal esophagus, small hiatal hernia, minimally polypoid antral mucosa with doubtful clinical was significance.  Otherwise, normal stomach, patent pylorus, and normal D1-D2  . ESOPHAGOGASTRODUODENOSCOPY (EGD)  WITH PROPOFOL N/A 02/18/2013   Procedure: ESOPHAGOGASTRODUODENOSCOPY (EGD) WITH PROPOFOL;  Surgeon: Daneil Dolin, MD;  Location: [facility];  Service: Endoscopy;  Laterality: N/A;  . NASAL SINUS SURGERY  01/24/2012   Procedure: ENDOSCOPIC SINUS SURGERY;  Surgeon: Izora Gala, MD;  Location: [facility] OR;  Service: ENT;  Laterality: Left;  left ethmoidectomy, maxillary, frontal  . UMBILICAL HERNIA REPAIR      Family Psychiatric History: ***  Family History:  Family History  Problem Relation Age of Onset  . Anesthesia problems Mother   . Colon cancer Maternal Grandfather   . Crohn's disease Maternal Grandfather     Social History:   Social History   Social History  . Marital status: Divorced    Spouse name: N/A  . Number of children: N/A  . Years of education: N/A   Social History Main Topics  . Smoking status: Current Every Day Smoker    Packs/day: 1.00    Years: 22.00    Types: Cigarettes  . Smokeless tobacco: Never Used  . Alcohol use No  . Drug use: No  . Sexual activity: Not on file   Other Topics Concern  . Not on file   Social History Narrative  . No narrative on file    Additional Social History: ***  Allergies:   Allergies  Allergen Reactions  . Bee Venom   . Compazine   . Imitrex [Sumatriptan Base] Nausea And Vomiting  . Prochlorperazine Edisylate     REACTION: Rash  . Risperidone     REACTION: Tremors    Metabolic Disorder Labs: No results found for: HGBA1C, MPG No results found for: PROLACTIN Lab Results  Component Value Date   CHOL 231 (H) 03/13/2009  TRIG 210 (H) 03/13/2009   HDL 41 03/13/2009   CHOLHDL 5.6 Ratio 03/13/2009   VLDL 42 (H) 03/13/2009   LDLCALC 148 (H) 03/13/2009   LDLCALC See Comment mg/dL 09/12/2008     Current Medications: Current Outpatient Prescriptions  Medication Sig Dispense Refill  . albuterol (PROVENTIL HFA;VENTOLIN HFA) 108 (90 BASE) MCG/ACT inhaler Inhale 1-2 puffs into the lungs every 6 (six) hours as needed for  wheezing. 1 Inhaler 0  . benztropine (COGENTIN) 0.5 MG tablet Take 1 mg by mouth at bedtime.     . cetirizine (ZYRTEC) 10 MG tablet Take 10 mg by mouth every morning.    . Choline Fenofibrate 135 MG capsule Take 135 mg by mouth every morning.    . citalopram (CELEXA) 20 MG tablet Take 20 mg by mouth every morning.    Marland Kitchen dexlansoprazole (DEXILANT) 60 MG capsule Take 1 capsule (60 mg total) by mouth daily. 30 capsule 3  . dexlansoprazole (DEXILANT) 60 MG capsule Take 1 capsule (60 mg total) by mouth daily. 30 capsule 3  . diazepam (VALIUM) 5 MG tablet Take 10 mg by mouth 3 (three) times daily as needed for anxiety. For anxiety    . doxepin (SINEQUAN) 150 MG capsule Take 150 mg by mouth at bedtime.    Marland Kitchen EPINEPHrine (EPIPEN 2-PAK IJ) Inject as directed.    . gabapentin (NEURONTIN) 400 MG capsule Take 400 mg by mouth 2 (two) times daily.    Marland Kitchen gabapentin (NEURONTIN) 800 MG tablet Take 800 mg by mouth at bedtime.     . haloperidol decanoate (HALDOL DECANOATE) 100 MG/ML injection Inject 150 mg into the muscle every 28 (twenty-eight) days.    Marland Kitchen HYDROcodone-acetaminophen (NORCO) 10-325 MG per tablet Take 1 tablet by mouth every 6 (six) hours as needed for pain.     Marland Kitchen LINZESS 290 MCG CAPS TAKE 1 CAPSULE BY MOUTH ONCE DAILY 30 capsule 3  . metoprolol (LOPRESSOR) 50 MG tablet Take 50 mg by mouth 2 (two) times daily.      . niacin (NIASPAN) 500 MG CR tablet Take 500 mg by mouth at bedtime.      Marland Kitchen oxyCODONE (ROXICODONE) 15 MG immediate release tablet Take 15 mg by mouth 4 (four) times daily.    . polyethylene glycol powder (GLYCOLAX/MIRALAX) powder Take 17 g by mouth daily. 527 g 3  . QUEtiapine (SEROQUEL) 100 MG tablet Take 100 mg by mouth 2 (two) times daily.    Marland Kitchen tiZANidine (ZANAFLEX) 4 MG capsule Take 4 mg by mouth 3 (three) times daily.    . traZODone (DESYREL) 150 MG tablet Take 300 mg by mouth at bedtime.      No current facility-administered medications for this visit.     Neurologic: Headache:  No Seizure: No Paresthesias:No  Musculoskeletal: Strength & Muscle Tone: within normal limits Gait & Station: normal Patient leans: N/A  Psychiatric Specialty Exam: ROS  There were no vitals taken for this visit.There is no height or weight on file to calculate BMI.  General Appearance: Fairly Groomed  Eye Contact:  Good  Speech:  Clear and Coherent  Volume:  Normal  Mood:  {BHH MOOD:22306}  Affect:  {Affect (PAA):22687}  Thought Process:  Coherent and Goal Directed  Orientation:  Full (Time, Place, and Person)  Thought Content:  Logical  Suicidal Thoughts:  {ST/HT (PAA):22692}  Homicidal Thoughts:  {ST/HT (PAA):22692}  Memory:  Immediate;   Good Recent;   Good Remote;   Good  Judgement:  {Judgement (PAA):22694}  Insight:  {  Insight (PAA):22695}  Psychomotor Activity:  Normal  Concentration:  Concentration: Good and Attention Span: Good  Recall:  Good  Fund of Knowledge:Good  Language: Good  Akathisia:  No  Handed:  Right  AIMS (if indicated):  N/A  Assets:  Communication Skills Desire for Improvement  ADL's:  Intact  Cognition: WNL  Sleep:  ***   Assessment  Plan  The patient demonstrates the following risk factors for suicide: Chronic risk factors for suicide include: {Chronic Risk Factors for GOVPCHE:03524818}. Acute risk factors for suicide include: {Acute Risk Factors for HTMBPJP:21624469}. Protective factors for this patient include: {Protective Factors for Suicide FQHK:25750518}. Considering these factors, the overall suicide risk at this point appears to be {Desc; low/moderate/high:110033}. Patient {ACTION; IS/IS ZFP:82518984} appropriate for outpatient follow up.   Treatment Plan Summary: Plan as above   Norman Clay, MD 9/7/20189:29 AM

## 2017-07-22 ENCOUNTER — Ambulatory Visit (HOSPITAL_COMMUNITY): Payer: Medicare Other | Admitting: Psychiatry

## 2017-07-23 DIAGNOSIS — Z5181 Encounter for therapeutic drug level monitoring: Secondary | ICD-10-CM | POA: Diagnosis not present

## 2017-07-30 DIAGNOSIS — Z5181 Encounter for therapeutic drug level monitoring: Secondary | ICD-10-CM | POA: Diagnosis not present

## 2017-08-06 DIAGNOSIS — Z5181 Encounter for therapeutic drug level monitoring: Secondary | ICD-10-CM | POA: Diagnosis not present

## 2017-08-17 ENCOUNTER — Emergency Department (HOSPITAL_COMMUNITY)
Admission: EM | Admit: 2017-08-17 | Discharge: 2017-08-17 | Disposition: A | Discharge status: Home / Self Care | Payer: Medicare Other

## 2017-08-17 NOTE — ED Notes (Signed)
No answer in waiting room X3,  

## 2017-08-17 NOTE — ED Triage Notes (Signed)
No answer in waiting room X1,  

## 2017-08-17 NOTE — ED Notes (Signed)
No answer in waiting room X2

## 2017-09-01 ENCOUNTER — Encounter: Payer: Self-pay | Admitting: Nurse Practitioner

## 2017-09-01 ENCOUNTER — Telehealth: Payer: Self-pay | Admitting: Nurse Practitioner

## 2017-09-01 ENCOUNTER — Ambulatory Visit: Payer: Medicare Other | Admitting: Nurse Practitioner

## 2017-09-01 NOTE — Telephone Encounter (Signed)
PATIENT WAS A NO SHOW AND LETTER SENT  °

## 2017-09-02 NOTE — Telephone Encounter (Signed)
Noted  

## 2017-09-03 ENCOUNTER — Emergency Department (HOSPITAL_COMMUNITY)
Admission: EM | Admit: 2017-09-03 | Discharge: 2017-09-03 | Disposition: A | Discharge status: Home / Self Care | Payer: Medicare Other | Attending: Emergency Medicine | Admitting: Emergency Medicine

## 2017-09-03 ENCOUNTER — Encounter (HOSPITAL_COMMUNITY): Payer: Self-pay | Admitting: Cardiology

## 2017-09-03 DIAGNOSIS — J45909 Unspecified asthma, uncomplicated: Secondary | ICD-10-CM | POA: Diagnosis not present

## 2017-09-03 DIAGNOSIS — J40 Bronchitis, not specified as acute or chronic: Secondary | ICD-10-CM | POA: Diagnosis not present

## 2017-09-03 DIAGNOSIS — I10 Essential (primary) hypertension: Secondary | ICD-10-CM | POA: Insufficient documentation

## 2017-09-03 DIAGNOSIS — Z79899 Other long term (current) drug therapy: Secondary | ICD-10-CM | POA: Diagnosis not present

## 2017-09-03 DIAGNOSIS — J069 Acute upper respiratory infection, unspecified: Secondary | ICD-10-CM | POA: Insufficient documentation

## 2017-09-03 DIAGNOSIS — R05 Cough: Secondary | ICD-10-CM | POA: Diagnosis present

## 2017-09-03 DIAGNOSIS — F17210 Nicotine dependence, cigarettes, uncomplicated: Secondary | ICD-10-CM | POA: Insufficient documentation

## 2017-09-03 MED ORDER — DEXAMETHASONE 4 MG PO TABS: 4.0000 mg | ORAL_TABLET | Freq: Two times a day (BID) | ORAL | 0 refills | Status: DC

## 2017-09-03 MED ORDER — ACETAMINOPHEN 325 MG PO TABS
650.0000 mg | ORAL_TABLET | Freq: Once | ORAL | Status: AC
  Administered 2017-09-03: 650 mg via ORAL
  Filled 2017-09-03: qty 2

## 2017-09-03 MED ORDER — CLINDAMYCIN HCL 150 MG PO CAPS: 150.0000 mg | ORAL_CAPSULE | Freq: Four times a day (QID) | ORAL | 0 refills | Status: DC

## 2017-09-03 MED ORDER — AZITHROMYCIN 250 MG PO TABS
500.0000 mg | ORAL_TABLET | Freq: Once | ORAL | Status: AC
  Administered 2017-09-03: 500 mg via ORAL
  Filled 2017-09-03: qty 2

## 2017-09-03 MED ORDER — IBUPROFEN 600 MG PO TABS: 600.0000 mg | ORAL_TABLET | Freq: Four times a day (QID) | ORAL | 0 refills | Status: DC

## 2017-09-03 MED ORDER — DEXTROMETHORPHAN-GUAIFENESIN 10-100 MG/5ML PO LIQD: 10.0000 mL | ORAL | Status: DC | PRN

## 2017-09-03 NOTE — ED Provider Notes (Signed)
[facility] EMERGENCY DEPARTMENT Provider Note   CSN: [id number] Arrival date & time: 09/03/17  1421     History   Chief Complaint Chief Complaint  Patient presents with  . Cough    HPI [name] is a 41 y.o. male.  Patient is a 41 year old male who presents to the emergency department with a complaint of cough and generally not feeling good.  The patient states that this problem started about 4 days ago.  He had some body aches, cough, and fever.  He states that his cough is productive at times.  He is not had any hemoptysis reported.  He has had some chills.  No unusual rash reported.  The patient has not been out of the country recently.  He has had a family member who is been sick recently.  He presents now for assistance with this problem.      Past Medical History:  Diagnosis Date  . Anxiety   . Asthma   . Bipolar 1 disorder ([place])   . Complication of anesthesia    Anxiety when he awaken with ET tube still in place  . GERD (gastroesophageal reflux disease)   . Hyperlipidemia   . Hypertension   . Hypoventilation syndrome   . Schizoaffective disorder ([place])    [place]    Patient Active Problem List   Diagnosis Date Noted  . Abdominal pain, epigastric 02/04/2013  . Constipation 12/02/2012  . Abdominal pain 11/22/2012  . INSOMNIA 04/26/2009  . BACK PAIN, LUMBAR 03/20/2009  . BIPOLAR AFFECTIVE DISORDER 02/17/2009  . OTHER ENTHESOPATHY OF ANKLE AND TARSUS 10/05/2008  . GERD 09/16/2008  . PERSONAL HISTORY OF SCHIZOPHRENIA 05/27/2008  . ESOPHAGITIS, REFLUX 03/23/2008  . HIATAL HERNIA 03/23/2008  . HYPERLIPIDEMIA 01/07/2008  . MORBID OBESITY 01/07/2008  . ANXIETY DEPRESSION 01/07/2008  . TOBACCO ABUSE 01/07/2008  . HYPERTENSION 01/07/2008  . ALLERGIC RHINITIS 01/07/2008  . IBS 01/07/2008    Past Surgical History:  Procedure Laterality Date  . ANKLE SURGERY    . APPENDECTOMY    . CHOLECYSTECTOMY    . ESOPHAGOGASTRODUODENOSCOPY  03/17/2008   RMR: A tiny distal esophageal erosions consistent with mild  erosive reflux esophagitis, otherwise normal esophagus, small hiatal hernia, minimally polypoid antral mucosa with doubtful clinical was significance.  Otherwise, normal stomach, patent pylorus, and normal D1-D2  . ESOPHAGOGASTRODUODENOSCOPY (EGD) WITH PROPOFOL N/A 02/18/2013   Procedure: ESOPHAGOGASTRODUODENOSCOPY (EGD) WITH PROPOFOL;  Surgeon: Daneil Dolin, MD;  Location: [facility];  Service: Endoscopy;  Laterality: N/A;  . NASAL SINUS SURGERY  01/24/2012   Procedure: ENDOSCOPIC SINUS SURGERY;  Surgeon: Izora Gala, MD;  Location: [place];  Service: ENT;  Laterality: Left;  left ethmoidectomy, maxillary, frontal  . UMBILICAL HERNIA REPAIR         Home Medications    Prior to Admission medications   Medication Sig Start Date End Date Taking? Authorizing Provider  albuterol (PROVENTIL HFA;VENTOLIN HFA) 108 (90 BASE) MCG/ACT inhaler Inhale 1-2 puffs into the lungs every 6 (six) hours as needed for wheezing. 04/21/12 04/21/13  Prentiss Bells, MD  benztropine (COGENTIN) 0.5 MG tablet Take 1 mg by mouth at bedtime.     [provider]  cetirizine (ZYRTEC) 10 MG tablet Take 10 mg by mouth every morning.    [provider]  Choline Fenofibrate 135 MG capsule Take 135 mg by mouth every morning.    [provider]  citalopram (CELEXA) 20 MG tablet Take 20 mg by mouth every morning.  [provider]  clindamycin (CLEOCIN) 150 MG capsule Take 1 capsule (150 mg total) by mouth every 6 (six) hours. 09/03/17   Lily Kocher, PA-C  dexamethasone (DECADRON) 4 MG tablet Take 1 tablet (4 mg total) by mouth 2 (two) times daily with a meal. 09/03/17   Lily Kocher, PA-C  dexlansoprazole (DEXILANT) 60 MG capsule Take 1 capsule (60 mg total) by mouth daily. 12/02/12   Annitta Needs, NP  dexlansoprazole (DEXILANT) 60 MG capsule Take 1 capsule (60 mg total) by mouth daily. 03/31/13   Annitta Needs, NP    dextromethorphan-guaiFENesin (ROBITUSSIN-DM) 10-100 MG/5ML liquid Take 10 mLs by mouth every 4 (four) hours as needed for cough. 09/03/17   Lily Kocher, PA-C  diazepam (VALIUM) 5 MG tablet Take 10 mg by mouth 3 (three) times daily as needed for anxiety. For anxiety    [provider]  doxepin (SINEQUAN) 150 MG capsule Take 150 mg by mouth at bedtime.    [provider]  EPINEPHrine (EPIPEN 2-PAK IJ) Inject as directed.    [provider]  gabapentin (NEURONTIN) 400 MG capsule Take 400 mg by mouth 2 (two) times daily.    [provider]  gabapentin (NEURONTIN) 800 MG tablet Take 800 mg by mouth at bedtime.     [provider]  haloperidol decanoate (HALDOL DECANOATE) 100 MG/ML injection Inject 150 mg into the muscle every 28 (twenty-eight) days.    [provider]  HYDROcodone-acetaminophen (NORCO) 10-325 MG per tablet Take 1 tablet by mouth every 6 (six) hours as needed for pain.     [provider]  ibuprofen (ADVIL,MOTRIN) 600 MG tablet Take 1 tablet (600 mg total) by mouth 4 (four) times daily. 09/03/17   Lily Kocher, PA-C  LINZESS 290 MCG CAPS TAKE 1 CAPSULE BY MOUTH ONCE DAILY 06/08/13   Annitta Needs, NP  metoprolol (LOPRESSOR) 50 MG tablet Take 50 mg by mouth 2 (two) times daily.      [provider]  niacin (NIASPAN) 500 MG CR tablet Take 500 mg by mouth at bedtime.      [provider]  oxyCODONE (ROXICODONE) 15 MG immediate release tablet Take 15 mg by mouth 4 (four) times daily.    [provider]  polyethylene glycol powder (GLYCOLAX/MIRALAX) powder Take 17 g by mouth daily. 12/02/12   Annitta Needs, NP  QUEtiapine (SEROQUEL) 100 MG tablet Take 100 mg by mouth 2 (two) times daily.    [provider]  tiZANidine (ZANAFLEX) 4 MG capsule Take 4 mg by mouth 3 (three) times daily.    [provider]  traZODone (DESYREL) 150 MG tablet Take 300 mg by mouth at bedtime.     [provider]    Family History Family History  Problem Relation Age of Onset  . Anesthesia problems Mother   . Colon cancer Maternal Grandfather   . Crohn's disease Maternal Grandfather     Social History Social History  Substance Use Topics  . Smoking status: Current Every Day Smoker    Packs/day: 1.00    Years: 22.00    Types: Cigarettes  . Smokeless tobacco: Never Used  . Alcohol use No     Allergies   Bee venom; Compazine; Imitrex [sumatriptan base]; Prochlorperazine edisylate; and Risperidone   Review of Systems Review of Systems  Constitutional: Positive for chills and fever. Negative for activity change.       All ROS Neg except as noted in HPI  HENT:  Positive for congestion. Negative for nosebleeds.   Eyes: Negative for photophobia and discharge.  Respiratory: Positive for cough. Negative for shortness of breath and wheezing.   Cardiovascular: Negative for chest pain and palpitations.  Gastrointestinal: Negative for abdominal pain and blood in stool.  Genitourinary: Negative for dysuria, frequency and hematuria.  Musculoskeletal: Positive for myalgias. Negative for arthralgias, back pain and neck pain.  Skin: Negative.   Neurological: Negative for dizziness, seizures and speech difficulty.  Psychiatric/Behavioral: Negative for confusion and hallucinations.     Physical Exam Updated Vital Signs BP (!) 159/71 (BP Location: Right Arm)   Pulse 97   Temp 97.9 F (36.6 C) (Oral)   Resp 16   Ht 6' 2.5" (1.892 m)   Wt (!) 137 kg (302 lb)   SpO2 94%   BMI 38.26 kg/m   Physical Exam  Constitutional: He is oriented to person, place, and time. He appears well-developed and well-nourished.  Non-toxic appearance.  HENT:  Head: Normocephalic.  Right Ear: Tympanic membrane and external ear normal.  Left Ear: Tympanic membrane and external ear normal.  Nasal congestion present.  Eyes: Pupils are equal, round, and reactive to light. EOM and lids are normal.    Neck: Normal range of motion. Neck supple. Carotid bruit is not present.  Cardiovascular: Normal rate, regular rhythm, normal heart sounds, intact distal pulses and normal pulses.   Pulmonary/Chest: Breath sounds normal. No respiratory distress. He has no rales.  Coarse breath sounds with scattered rhonchi present. Pt speaks in complete sentences. Symmetrical rise and fall of lthe chest.  Abdominal: Soft. Bowel sounds are normal. There is no tenderness. There is no guarding.  Musculoskeletal: Normal range of motion.  Lymphadenopathy:       Head (right side): No submandibular adenopathy present.       Head (left side): No submandibular adenopathy present.    He has no cervical adenopathy.  Neurological: He is alert and oriented to person, place, and time. He has normal strength. No cranial nerve deficit or sensory deficit.  Skin: Skin is warm and dry. No rash noted.  Psychiatric: He has a normal mood and affect. His speech is normal.  Nursing note and vitals reviewed.    ED Treatments / Results  Labs (all labs ordered are listed, but only abnormal results are displayed) Labs Reviewed - No data to display  EKG  EKG Interpretation None       Radiology No results found.  Procedures Procedures (including critical care time)  Medications Ordered in ED Medications  acetaminophen (TYLENOL) tablet 650 mg (650 mg Oral Given 09/03/17 1447)  azithromycin (ZITHROMAX) tablet 500 mg (500 mg Oral Given 09/03/17 1605)     Initial Impression / Assessment and Plan / ED Course  I have reviewed the triage vital signs and the nursing notes.  Pertinent labs & imaging results that were available during my care of the patient were reviewed by me and considered in my medical decision making (see chart for details).       Final Clinical Impressions(s) / ED Diagnoses MDM Patient speaks in complete sentences.  He is in no distress.  His pulse oximetry is 94-95% on room air. The examination  favors bronchitis and upper respiratory infection.  The patient will be treated with ibuprofen every 6 hours for body aches, fever and chills.  Robitussin-DM for cough, Decadron 2 times daily and clindamycin 4 times daily.  If the patient is to follow-up with Dr. Luan Pulling or return to the emergency  department if any changes, problems, or concerns.   Final diagnoses:  Bronchitis  Acute upper respiratory infection    New Prescriptions New Prescriptions   CLINDAMYCIN (CLEOCIN) 150 MG CAPSULE    Take 1 capsule (150 mg total) by mouth every 6 (six) hours.   DEXAMETHASONE (DECADRON) 4 MG TABLET    Take 1 tablet (4 mg total) by mouth 2 (two) times daily with a meal.   DEXTROMETHORPHAN-GUAIFENESIN (ROBITUSSIN-DM) 10-100 MG/5ML LIQUID    Take 10 mLs by mouth every 4 (four) hours as needed for cough.   IBUPROFEN (ADVIL,MOTRIN) 600 MG TABLET    Take 1 tablet (600 mg total) by mouth 4 (four) times daily.     Lily Kocher, PA-C 09/04/17 [id number]    Milton Ferguson, MD 09/04/17 [phone]

## 2017-09-03 NOTE — ED Triage Notes (Signed)
Cough and fever times 4 days.

## 2017-09-03 NOTE — Discharge Instructions (Signed)
Your vital signs within normal limits. Your oxygen level is 94-96%. Your examination suggest bronchitis. Please use clindamycin 4 times daily with breakfast, lunch, dinner, and at bedtime. Please use Decadron 2 times daily with food. Robitussin-DM every 4 hours for cough and ibuprofen every 6 hours or 4 times daily for aching and fever. It is important that you increase fluids. Please wash hands frequently. Please use your mask until symptoms have resolved.

## 2017-09-04 ENCOUNTER — Other Ambulatory Visit: Payer: Self-pay

## 2017-09-04 NOTE — Patient Outreach (Signed)
Outreach patient after ED visit on 09/03/17 at [facility].  There was no answer and voicemail was not set up for me to leave a voicemail message asking for a return call.  Unusuccessful letter, magnet and know before you go mailed on 09/04/17.

## 2017-09-09 DIAGNOSIS — R069 Unspecified abnormalities of breathing: Secondary | ICD-10-CM | POA: Diagnosis not present

## 2017-09-09 DIAGNOSIS — R05 Cough: Secondary | ICD-10-CM | POA: Diagnosis not present

## 2017-09-09 DIAGNOSIS — J989 Respiratory disorder, unspecified: Secondary | ICD-10-CM | POA: Diagnosis not present

## 2017-09-10 ENCOUNTER — Encounter (HOSPITAL_COMMUNITY): Payer: Self-pay

## 2017-09-10 ENCOUNTER — Emergency Department (HOSPITAL_COMMUNITY)
Admission: EM | Admit: 2017-09-10 | Discharge: 2017-09-10 | Disposition: A | Discharge status: Home / Self Care | Payer: Medicare Other | Attending: Emergency Medicine | Admitting: Emergency Medicine

## 2017-09-10 ENCOUNTER — Emergency Department (HOSPITAL_COMMUNITY): Payer: Medicare Other

## 2017-09-10 DIAGNOSIS — J441 Chronic obstructive pulmonary disease with (acute) exacerbation: Secondary | ICD-10-CM | POA: Diagnosis not present

## 2017-09-10 DIAGNOSIS — F17210 Nicotine dependence, cigarettes, uncomplicated: Secondary | ICD-10-CM | POA: Insufficient documentation

## 2017-09-10 DIAGNOSIS — J209 Acute bronchitis, unspecified: Secondary | ICD-10-CM | POA: Insufficient documentation

## 2017-09-10 DIAGNOSIS — Z79899 Other long term (current) drug therapy: Secondary | ICD-10-CM | POA: Diagnosis not present

## 2017-09-10 DIAGNOSIS — R0602 Shortness of breath: Secondary | ICD-10-CM | POA: Diagnosis not present

## 2017-09-10 DIAGNOSIS — I10 Essential (primary) hypertension: Secondary | ICD-10-CM | POA: Insufficient documentation

## 2017-09-10 DIAGNOSIS — J45909 Unspecified asthma, uncomplicated: Secondary | ICD-10-CM | POA: Diagnosis not present

## 2017-09-10 LAB — BASIC METABOLIC PANEL
Anion gap: 8 (ref 5–15)
BUN: 16 mg/dL (ref 6–20)
CALCIUM: 8.9 mg/dL (ref 8.9–10.3)
CO2: 25 mmol/L (ref 22–32)
CREATININE: 0.94 mg/dL (ref 0.61–1.24)
Chloride: 104 mmol/L (ref 101–111)
GFR calc Af Amer: >60 mL/min (ref >60)
GLUCOSE: 165 mg/dL — AB (ref 65–99)
Potassium: 3.6 mmol/L (ref 3.5–5.1)
Sodium: 137 mmol/L (ref 135–145)

## 2017-09-10 LAB — CBC
HCT: 39.3 % (ref 39.0–52.0)
HEMOGLOBIN: 13.6 g/dL (ref 13.0–17.0)
MCH: 29.3 pg (ref 26.0–34.0)
MCHC: 34.6 g/dL (ref 30.0–36.0)
MCV: 84.7 fL (ref 78.0–100.0)
Platelets: 240 10*3/uL (ref 150–400)
RBC: 4.64 MIL/uL (ref 4.22–5.81)
RDW: 15.2 % (ref 11.5–15.5)
WBC: 11.6 10*3/uL — ABNORMAL HIGH (ref 4.0–10.5)

## 2017-09-10 MED ORDER — ALBUTEROL SULFATE HFA 108 (90 BASE) MCG/ACT IN AERS
2.0000 | INHALATION_SPRAY | Freq: Four times a day (QID) | RESPIRATORY_TRACT | Status: DC | PRN
  Administered 2017-09-10: 2 via RESPIRATORY_TRACT
  Filled 2017-09-10: qty 6.7

## 2017-09-10 MED ORDER — LEVOFLOXACIN IN D5W 750 MG/150ML IV SOLN
750.0000 mg | Freq: Once | INTRAVENOUS | Status: AC
  Administered 2017-09-10: 750 mg via INTRAVENOUS
  Filled 2017-09-10: qty 150

## 2017-09-10 MED ORDER — PREDNISONE 20 MG PO TABS: ORAL_TABLET | ORAL | 0 refills | Status: DC

## 2017-09-10 MED ORDER — ALBUTEROL SULFATE (2.5 MG/3ML) 0.083% IN NEBU
INHALATION_SOLUTION | RESPIRATORY_TRACT | Status: AC
  Filled 2017-09-10: qty 6

## 2017-09-10 MED ORDER — METHYLPREDNISOLONE SODIUM SUCC 125 MG IJ SOLR
125.0000 mg | Freq: Once | INTRAMUSCULAR | Status: AC
  Administered 2017-09-10: 125 mg via INTRAVENOUS
  Filled 2017-09-10: qty 2

## 2017-09-10 MED ORDER — ALBUTEROL SULFATE (2.5 MG/3ML) 0.083% IN NEBU
5.0000 mg | INHALATION_SOLUTION | Freq: Once | RESPIRATORY_TRACT | Status: AC
  Administered 2017-09-10: 5 mg via RESPIRATORY_TRACT

## 2017-09-10 MED ORDER — AEROCHAMBER Z-STAT PLUS/MEDIUM MISC
1.0000 | Freq: Once | Status: AC
  Administered 2017-09-10: 1
  Filled 2017-09-10: qty 1

## 2017-09-10 MED ORDER — DOXYCYCLINE HYCLATE 100 MG PO CAPS: 100.0000 mg | ORAL_CAPSULE | Freq: Two times a day (BID) | ORAL | 0 refills | Status: DC

## 2017-09-10 MED ORDER — IPRATROPIUM BROMIDE 0.02 % IN SOLN
0.5000 mg | Freq: Once | RESPIRATORY_TRACT | Status: AC
  Administered 2017-09-10: 0.5 mg via RESPIRATORY_TRACT
  Filled 2017-09-10: qty 2.5

## 2017-09-10 MED ORDER — BENZONATATE 100 MG PO CAPS: 100.0000 mg | ORAL_CAPSULE | Freq: Three times a day (TID) | ORAL | 0 refills | Status: DC

## 2017-09-10 MED ORDER — ALBUTEROL (5 MG/ML) CONTINUOUS INHALATION SOLN
10.0000 mg/h | INHALATION_SOLUTION | Freq: Once | RESPIRATORY_TRACT | Status: AC
  Administered 2017-09-10: 10 mg/h via RESPIRATORY_TRACT
  Filled 2017-09-10: qty 20

## 2017-09-10 NOTE — ED Notes (Signed)
Pt ambulating at 95%

## 2017-09-10 NOTE — ED Provider Notes (Signed)
[facility] EMERGENCY DEPARTMENT Provider Note   CSN: [id number] Arrival date & time: 09/10/17  0005  Time seen 12:50 AM   History   Chief Complaint Chief Complaint  Patient presents with  . Shortness of Breath    HPI [name] is a 41 y.o. male.  HPI patient reports he started having symptoms last week, he states he had fever up to 101, cough, chest congestion with wheezing and shortness of breath.  He was seen in urgent care and was given Zithromax and discharged on?  Cephalexin and Decadron.  He states he has 1 pill left of the antibiotic.  He states he still coughing and he feels like he cannot cough up the mucus.  He states his fever is now gone.  He denies shortness of breath or rhinorrhea but states he has lots of sneezing.  He states he does not have an albuterol inhaler for several years and he is only been using his Ruthe Mannan that he has been on for 2 years.  He states he supposed to be on oxygen 2 L/min nasal cannula at nighttime however he is not using it.  Patient does smoke.  He reports he just is not feeling better.  PCP Sinda Du, MD  Has an appt on Nov 5   Past Medical History:  Diagnosis Date  . Anxiety   . Asthma   . Bipolar 1 disorder ([place])   . Complication of anesthesia    Anxiety when he awaken with ET tube still in place  . GERD (gastroesophageal reflux disease)   . Hyperlipidemia   . Hypertension   . Hypoventilation syndrome   . Schizoaffective disorder ([place])    [place]    Patient Active Problem List   Diagnosis Date Noted  . Abdominal pain, epigastric 02/04/2013  . Constipation 12/02/2012  . Abdominal pain 11/22/2012  . INSOMNIA 04/26/2009  . BACK PAIN, LUMBAR 03/20/2009  . BIPOLAR AFFECTIVE DISORDER 02/17/2009  . OTHER ENTHESOPATHY OF ANKLE AND TARSUS 10/05/2008  . GERD 09/16/2008  . PERSONAL HISTORY OF SCHIZOPHRENIA 05/27/2008  . ESOPHAGITIS, REFLUX 03/23/2008  . HIATAL HERNIA 03/23/2008  . HYPERLIPIDEMIA 01/07/2008  .  MORBID OBESITY 01/07/2008  . ANXIETY DEPRESSION 01/07/2008  . TOBACCO ABUSE 01/07/2008  . HYPERTENSION 01/07/2008  . ALLERGIC RHINITIS 01/07/2008  . IBS 01/07/2008    Past Surgical History:  Procedure Laterality Date  . ANKLE SURGERY    . APPENDECTOMY    . CHOLECYSTECTOMY    . ESOPHAGOGASTRODUODENOSCOPY  03/17/2008   RMR: A tiny distal esophageal erosions consistent with mild  erosive reflux esophagitis, otherwise normal esophagus, small hiatal hernia, minimally polypoid antral mucosa with doubtful clinical was significance.  Otherwise, normal stomach, patent pylorus, and normal D1-D2  . ESOPHAGOGASTRODUODENOSCOPY (EGD) WITH PROPOFOL N/A 02/18/2013   Procedure: ESOPHAGOGASTRODUODENOSCOPY (EGD) WITH PROPOFOL;  Surgeon: Daneil Dolin, MD;  Location: [facility];  Service: Endoscopy;  Laterality: N/A;  . NASAL SINUS SURGERY  01/24/2012   Procedure: ENDOSCOPIC SINUS SURGERY;  Surgeon: Izora Gala, MD;  Location: [place];  Service: ENT;  Laterality: Left;  left ethmoidectomy, maxillary, frontal  . UMBILICAL HERNIA REPAIR         Home Medications    Prior to Admission medications   Medication Sig Start Date End Date Taking? Authorizing Provider  albuterol (PROVENTIL HFA;VENTOLIN HFA) 108 (90 BASE) MCG/ACT inhaler Inhale 1-2 puffs into the lungs every 6 (six) hours as needed for wheezing. 04/21/12 04/21/13  Prentiss Bells, MD  benzonatate [facility])  100 MG capsule Take 1 capsule (100 mg total) by mouth every 8 (eight) hours. 09/10/17   Rolland Porter, MD  benztropine (COGENTIN) 0.5 MG tablet Take 1 mg by mouth at bedtime.     [provider]  cetirizine (ZYRTEC) 10 MG tablet Take 10 mg by mouth every morning.    [provider]  Choline Fenofibrate 135 MG capsule Take 135 mg by mouth every morning.    [provider]  citalopram (CELEXA) 20 MG tablet Take 20 mg by mouth every morning.    [provider]  clindamycin (CLEOCIN) 150 MG capsule Take 1 capsule (150 mg  total) by mouth every 6 (six) hours. 09/03/17   Lily Kocher, PA-C  dexamethasone (DECADRON) 4 MG tablet Take 1 tablet (4 mg total) by mouth 2 (two) times daily with a meal. 09/03/17   Lily Kocher, PA-C  dexlansoprazole (DEXILANT) 60 MG capsule Take 1 capsule (60 mg total) by mouth daily. 12/02/12   Annitta Needs, NP  dexlansoprazole (DEXILANT) 60 MG capsule Take 1 capsule (60 mg total) by mouth daily. 03/31/13   Annitta Needs, NP  dextromethorphan-guaiFENesin (ROBITUSSIN-DM) 10-100 MG/5ML liquid Take 10 mLs by mouth every 4 (four) hours as needed for cough. 09/03/17   Lily Kocher, PA-C  diazepam (VALIUM) 5 MG tablet Take 10 mg by mouth 3 (three) times daily as needed for anxiety. For anxiety    [provider]  doxepin (SINEQUAN) 150 MG capsule Take 150 mg by mouth at bedtime.    [provider]  doxycycline (VIBRAMYCIN) 100 MG capsule Take 1 capsule (100 mg total) by mouth 2 (two) times daily. 09/10/17   Rolland Porter, MD  EPINEPHrine (EPIPEN 2-PAK IJ) Inject as directed.    [provider]  gabapentin (NEURONTIN) 400 MG capsule Take 400 mg by mouth 2 (two) times daily.    [provider]  gabapentin (NEURONTIN) 800 MG tablet Take 800 mg by mouth at bedtime.     [provider]  haloperidol decanoate (HALDOL DECANOATE) 100 MG/ML injection Inject 150 mg into the muscle every 28 (twenty-eight) days.    [provider]  HYDROcodone-acetaminophen (NORCO) 10-325 MG per tablet Take 1 tablet by mouth every 6 (six) hours as needed for pain.     [provider]  ibuprofen (ADVIL,MOTRIN) 600 MG tablet Take 1 tablet (600 mg total) by mouth 4 (four) times daily. 09/03/17   Lily Kocher, PA-C  LINZESS 290 MCG CAPS TAKE 1 CAPSULE BY MOUTH ONCE DAILY 06/08/13   Annitta Needs, NP  metoprolol (LOPRESSOR) 50 MG tablet Take 50 mg by mouth 2 (two) times daily.      [provider]  niacin (NIASPAN) 500 MG CR tablet Take 500 mg by mouth at  bedtime.      [provider]  oxyCODONE (ROXICODONE) 15 MG immediate release tablet Take 15 mg by mouth 4 (four) times daily.    [provider]  polyethylene glycol powder (GLYCOLAX/MIRALAX) powder Take 17 g by mouth daily. 12/02/12   Annitta Needs, NP  predniSONE (DELTASONE) 20 MG tablet Take 3 po QD x 3d , then 2 po QD x 3d then 1 po QD x 3d 09/10/17   Rolland Porter, MD  QUEtiapine (SEROQUEL) 100 MG tablet Take 100 mg by mouth 2 (two) times daily.    [provider]  tiZANidine (ZANAFLEX) 4 MG capsule Take 4 mg by mouth 3 (three) times daily.    [provider]  traZODone (DESYREL) 150 MG tablet Take 300 mg by mouth at bedtime.     [provider]    Family History Family History  Problem Relation Age of Onset  . Anesthesia problems Mother   . Colon cancer Maternal Grandfather   . Crohn's disease Maternal Grandfather     Social History Social History  Substance Use Topics  . Smoking status: Current Every Day Smoker    Packs/day: 1.00    Years: 22.00    Types: Cigarettes  . Smokeless tobacco: Never Used  . Alcohol use No  on disability for mental health Smokes 1/4 ppd   Allergies   Bee venom; Compazine; Imitrex [sumatriptan base]; Prochlorperazine edisylate; and Risperidone   Review of Systems Review of Systems  All other systems reviewed and are negative.    Physical Exam Updated Vital Signs BP 121/69 (BP Location: Right Arm)   Temp 98 F (36.7 C) (Oral)   Resp 20   Ht 6\' 2"  (1.88 m)   Wt (!) 137 kg (302 lb)   SpO2 94%   BMI 38.77 kg/m   Vital signs normal    Physical Exam  Constitutional: He is oriented to person, place, and time. He appears well-developed and well-nourished.  Non-toxic appearance. He does not appear ill. No distress.  HENT:  Head: Normocephalic and atraumatic.  Right Ear: External ear normal.  Left Ear: External ear normal.  Nose: Nose normal. No mucosal edema or rhinorrhea.  Mouth/Throat:  Oropharynx is clear and moist and mucous membranes are normal. No dental abscesses or uvula swelling.  edentulous  Eyes: Pupils are equal, round, and reactive to light. Conjunctivae and EOM are normal.  Neck: Normal range of motion and full passive range of motion without pain. Neck supple.  Cardiovascular: Normal rate, regular rhythm and normal heart sounds.  Exam reveals no gallop and no friction rub.   No murmur heard. Pulmonary/Chest: Accessory muscle usage present. Tachypnea noted. No respiratory distress. He has decreased breath sounds. He has wheezes. He has no rhonchi. He has no rales. He exhibits no tenderness and no crepitus.  Patient is coughing frequentlyPatient was examined after his first albuterol nebulizer.  Abdominal: Soft. Normal appearance and bowel sounds are normal. He exhibits no distension. There is no tenderness. There is no rebound and no guarding.  Musculoskeletal: Normal range of motion. He exhibits no edema or tenderness.  Moves all extremities well.   Neurological: He is alert and oriented to person, place, and time. He has normal strength. No cranial nerve deficit.  Skin: Skin is warm, dry and intact. No rash noted. No erythema. No pallor.  Psychiatric: He has a normal mood and affect. His speech is normal and behavior is normal. His mood appears not anxious.  Nursing note and vitals reviewed.    ED Treatments / Results  Labs (all labs ordered are listed, but only abnormal results are displayed) Results for orders placed or performed during the hospital encounter of 09/10/17  Culture, blood (routine x 2)  Result Value Ref Range   Specimen Description BLOOD RIGHT ARM    Special Requests      BOTTLES DRAWN AEROBIC AND ANAEROBIC Blood Culture adequate volume   Culture PENDING    Report Status PENDING   Culture, blood (routine x 2)  Result Value Ref Range   Specimen Description BLOOD LEFT ARM    Special Requests      BOTTLES DRAWN AEROBIC AND ANAEROBIC Blood  Culture adequate volume   Culture PENDING  Report Status PENDING   Basic metabolic panel  Result Value Ref Range   Sodium 137 135 - 145 mmol/L   Potassium 3.6 3.5 - 5.1 mmol/L   Chloride 104 101 - 111 mmol/L   CO2 25 22 - 32 mmol/L   Glucose, Bld 165 (H) 65 - 99 mg/dL   BUN 16 6 - 20 mg/dL   Creatinine, Ser 0.94 0.61 - 1.24 mg/dL   Calcium 8.9 8.9 - 10.3 mg/dL   GFR calc non Af Amer >60 >60 mL/min   GFR calc Af Amer >60 >60 mL/min   Anion gap 8 5 - 15  CBC  Result Value Ref Range   WBC 11.6 (H) 4.0 - 10.5 K/uL   RBC 4.64 4.22 - 5.81 MIL/uL   Hemoglobin 13.6 13.0 - 17.0 g/dL   HCT 39.3 39.0 - 52.0 %   MCV 84.7 78.0 - 100.0 fL   MCH 29.3 26.0 - 34.0 pg   MCHC 34.6 30.0 - 36.0 g/dL   RDW 15.2 11.5 - 15.5 %   Platelets 240 150 - 400 K/uL   Laboratory interpretation all normal except mild leukocytosis, hyperglycemia    EKG  EKG Interpretation  Date/Time:  Wednesday September 10 2017 00:05:41 EDT Ventricular Rate:  79 PR Interval:    QRS Duration: 93 QT Interval:  405 QTC Calculation: 465 R Axis:   47 Text Interpretation:  Sinus rhythm Nonspecific ST abnormality No significant change since last tracing 21 Jun 2016 Confirmed by Rolland Porter [phone]) on 09/10/2017 12:59:12 AM       Radiology Dg Chest 2 View  Result Date: 09/10/2017 CLINICAL DATA:  41 year old male with shortness of breath. EXAM: CHEST  2 VIEW COMPARISON:  Multiple prior chest radiographs dating back to 06/21/2016 and CT of the abdomen pelvis dated 05/23/2017 FINDINGS: There is no focal consolidation, pleural effusion, or pneumothorax. An apparent 12 mm rounded focus over the periphery of the right lower lung field is not well characterized and may be artifactual. This is not seen on the prior studies. There is stable cardiac size. No acute osseous pathology. IMPRESSION: 1. No acute cardiopulmonary process. 2. Faint 12 mm nodular appearing density along the periphery of the lower lung fields on the right is  felt to be artifactual. Attention to this area on follow-up imaging recommended. Electronically Signed   By: Anner Crete M.D.   On: 09/10/2017 00:58    Procedures Procedures (including critical care time)  Medications Ordered in ED Medications  levofloxacin (LEVAQUIN) IVPB 750 mg (750 mg Intravenous New Bag/Given 09/10/17 0312)  albuterol (PROVENTIL HFA;VENTOLIN HFA) 108 (90 Base) MCG/ACT inhaler 2 puff (not administered)  aerochamber Z-Stat Plus/medium 1 each (not administered)  albuterol (PROVENTIL) (2.5 MG/3ML) 0.083% nebulizer solution 5 mg (5 mg Nebulization Given 09/10/17 0025)  albuterol (PROVENTIL,VENTOLIN) solution continuous neb (10 mg/hr Nebulization Given 09/10/17 0112)  ipratropium (ATROVENT) nebulizer solution 0.5 mg (0.5 mg Nebulization Given 09/10/17 0112)  methylPREDNISolone sodium succinate (SOLU-MEDROL) 125 mg/2 mL injection 125 mg (125 mg Intravenous Given 09/10/17 0117)     Initial Impression / Assessment and Plan / ED Course  I have reviewed the triage vital signs and the nursing notes.  Pertinent labs & imaging results that were available during my care of the patient were reviewed by me and considered in my medical decision making (see chart for details).     Patient was given a continuous nebulizer, he was given IV steroids.  Patient has already been on antibiotics.  He was  given IV antibiotics in the ED.  Recheck at 2:50 AM patient has just finished his continuous nebulizer.  He is sleeping in no distress.  Heart relisten to him he has improved air movement and now has diffuse rhonchi in both lungs.  I am going to have nursing staff ambulate him to's make sure he does not become hypoxic.  He states he is feeling much better.  03:45 AM nurses ambulate patient and his pulse ox remained 95%.  Patient states he still feels fine, he does not feel more short of breath.  When I listen to them now all the rhonchi are gone.  This point I feel the patient is able to  be discharged home.  Final Clinical Impressions(s) / ED Diagnoses   Final diagnoses:  Bronchitis with bronchospasm    New Prescriptions New Prescriptions   BENZONATATE (TESSALON) 100 MG CAPSULE    Take 1 capsule (100 mg total) by mouth every 8 (eight) hours.   DOXYCYCLINE (VIBRAMYCIN) 100 MG CAPSULE    Take 1 capsule (100 mg total) by mouth 2 (two) times daily.   PREDNISONE (DELTASONE) 20 MG TABLET    Take 3 po QD x 3d , then 2 po QD x 3d then 1 po QD x 3d    Plan discharge  Rolland Porter, MD, Barbette Or, MD 09/10/17 [phone]

## 2017-09-10 NOTE — ED Triage Notes (Signed)
SOB Pt states that he was seen at Urgent care last wk states he had a temp of 101.11F no chest xray was taken but given antibiotic and steroids that has not help.

## 2017-09-10 NOTE — Discharge Instructions (Signed)
Drink plenty of fluids. STOP SMOKING!!!  Use the albuterol inhaler for wheezing and shortness of breath. Take the antibiotics until gone. Use the tessalon perles for cough. Keep your appointment with Dr Luan Pulling on Nov 5.

## 2017-09-15 DIAGNOSIS — I10 Essential (primary) hypertension: Secondary | ICD-10-CM | POA: Diagnosis not present

## 2017-09-15 DIAGNOSIS — M545 Low back pain: Secondary | ICD-10-CM | POA: Diagnosis not present

## 2017-09-15 DIAGNOSIS — J449 Chronic obstructive pulmonary disease, unspecified: Secondary | ICD-10-CM | POA: Diagnosis not present

## 2017-09-15 LAB — CULTURE, BLOOD (ROUTINE X 2)
CULTURE: NO GROWTH
Culture: NO GROWTH
SPECIAL REQUESTS: ADEQUATE
Special Requests: ADEQUATE

## 2017-09-23 ENCOUNTER — Emergency Department (HOSPITAL_COMMUNITY)
Admission: EM | Admit: 2017-09-23 | Discharge: 2017-09-23 | Disposition: A | Discharge status: Home / Self Care | Payer: Medicare Other | Attending: Emergency Medicine | Admitting: Emergency Medicine

## 2017-09-23 ENCOUNTER — Encounter (HOSPITAL_COMMUNITY): Payer: Self-pay | Admitting: Emergency Medicine

## 2017-09-23 ENCOUNTER — Emergency Department (HOSPITAL_COMMUNITY): Payer: Medicare Other

## 2017-09-23 DIAGNOSIS — F17210 Nicotine dependence, cigarettes, uncomplicated: Secondary | ICD-10-CM | POA: Insufficient documentation

## 2017-09-23 DIAGNOSIS — S92514A Nondisplaced fracture of proximal phalanx of right lesser toe(s), initial encounter for closed fracture: Secondary | ICD-10-CM | POA: Diagnosis not present

## 2017-09-23 DIAGNOSIS — S99921A Unspecified injury of right foot, initial encounter: Secondary | ICD-10-CM | POA: Diagnosis present

## 2017-09-23 DIAGNOSIS — I10 Essential (primary) hypertension: Secondary | ICD-10-CM | POA: Diagnosis not present

## 2017-09-23 DIAGNOSIS — Y939 Activity, unspecified: Secondary | ICD-10-CM | POA: Diagnosis not present

## 2017-09-23 DIAGNOSIS — Y999 Unspecified external cause status: Secondary | ICD-10-CM | POA: Diagnosis not present

## 2017-09-23 DIAGNOSIS — W2209XA Striking against other stationary object, initial encounter: Secondary | ICD-10-CM | POA: Insufficient documentation

## 2017-09-23 DIAGNOSIS — Y929 Unspecified place or not applicable: Secondary | ICD-10-CM | POA: Insufficient documentation

## 2017-09-23 DIAGNOSIS — M79674 Pain in right toe(s): Secondary | ICD-10-CM | POA: Diagnosis not present

## 2017-09-23 MED ORDER — IBUPROFEN 400 MG PO TABS
400.0000 mg | ORAL_TABLET | Freq: Once | ORAL | Status: AC
  Administered 2017-09-23: 400 mg via ORAL
  Filled 2017-09-23: qty 1

## 2017-09-23 MED ORDER — IBUPROFEN 800 MG PO TABS: 800.0000 mg | ORAL_TABLET | Freq: Three times a day (TID) | ORAL | 0 refills | Status: DC

## 2017-09-23 MED ORDER — OXYCODONE-ACETAMINOPHEN 5-325 MG PO TABS
1.0000 | ORAL_TABLET | Freq: Once | ORAL | Status: DC
  Filled 2017-09-23: qty 1

## 2017-09-23 NOTE — ED Triage Notes (Signed)
Pt states he was walking when his toe got caught on a door today. Noted to have bruising and swelling to fifth toe on R foot. Ambulatory to triage.

## 2017-09-23 NOTE — ED Provider Notes (Signed)
Emergency Department Provider Note   I have reviewed the triage vital signs and the nursing notes.   HISTORY  Chief Complaint Toe Pain   HPI [name] is a 41 y.o. male who stubbed his toe earlier tonight with acute onset of pain and ecchymosis and swelling to his right small toe.  Patient states that he pulled on it and heard a pop but the pain did not improve so came here for further evaluation.  Did not fall or hurt anything else.  No other associated modifying symptoms.  Past Medical History:  Diagnosis Date  . Anxiety   . Asthma   . Bipolar 1 disorder ([place])   . Complication of anesthesia    Anxiety when he awaken with ET tube still in place  . GERD (gastroesophageal reflux disease)   . Hyperlipidemia   . Hypertension   . Hypoventilation syndrome   . Schizoaffective disorder ([place])    [name]    Patient Active Problem List   Diagnosis Date Noted  . Abdominal pain, epigastric 02/04/2013  . Constipation 12/02/2012  . Abdominal pain 11/22/2012  . INSOMNIA 04/26/2009  . BACK PAIN, LUMBAR 03/20/2009  . BIPOLAR AFFECTIVE DISORDER 02/17/2009  . OTHER ENTHESOPATHY OF ANKLE AND TARSUS 10/05/2008  . GERD 09/16/2008  . PERSONAL HISTORY OF SCHIZOPHRENIA 05/27/2008  . ESOPHAGITIS, REFLUX 03/23/2008  . HIATAL HERNIA 03/23/2008  . HYPERLIPIDEMIA 01/07/2008  . MORBID OBESITY 01/07/2008  . ANXIETY DEPRESSION 01/07/2008  . TOBACCO ABUSE 01/07/2008  . HYPERTENSION 01/07/2008  . ALLERGIC RHINITIS 01/07/2008  . IBS 01/07/2008    Past Surgical History:  Procedure Laterality Date  . ANKLE SURGERY    . APPENDECTOMY    . CHOLECYSTECTOMY    . ESOPHAGOGASTRODUODENOSCOPY  03/17/2008   RMR: A tiny distal esophageal erosions consistent with mild  erosive reflux esophagitis, otherwise normal esophagus, small hiatal hernia, minimally polypoid antral mucosa with doubtful clinical was significance.  Otherwise, normal stomach, patent pylorus, and normal D1-D2  . UMBILICAL  HERNIA REPAIR      Current Outpatient Rx  . Order #: [id number] Class: Print  . Order #: [id number] Class: Print  . Order #: [id number] Class: Historical Med  . Order #: [id number] Class: Historical Med  . Order #: [id number] Class: Historical Med  . Order #: [id number] Class: Historical Med  . Order #: [id number] Class: Print  . Order #: [id number] Class: Print  . Order #: [id number] Class: Normal  . Order #: [id number] Class: Normal  . Order #: [id number] Class: Print  . Order #: [id number] Class: Historical Med  . Order #: [id number] Class: Historical Med  . Order #: [id number] Class: Print  . Order #: [id number] Class: Historical Med  . Order #: [id number] Class: Historical Med  . Order #: [id number] Class: Historical Med  . Order #: [id number] Class: Historical Med  . Order #: [id number] Class: Historical Med  . Order #: [id number] Class: Print  . Order #: [id number] Class: Normal  . Order #: [id number] Class: Historical Med  . Order #: [id number] Class: Historical Med  . Order #: [id number] Class: Historical Med  . Order #: [id number] Class: Normal  . Order #: [id number] Class: Print  . Order #: [id number] Class: Historical Med  . Order #: [id number] Class: Historical Med  . Order #: [id number] Class: Historical Med    Allergies Compazine; Imitrex [sumatriptan base]; Prochlorperazine edisylate; and Risperidone  Family History  Problem Relation Age of Onset  . Anesthesia problems Mother   . Colon cancer Maternal Grandfather   . Crohn's disease Maternal Grandfather     Social History Social History   Tobacco Use  . Smoking  status: Current Every Day Smoker    Packs/day: 1.00    Years: 22.00    Pack years: 22.00    Types: Cigarettes  . Smokeless tobacco: Never Used  Substance Use Topics  . Alcohol use: No  . Drug use: No    Review of Systems  All other systems negative except as documented in the HPI. All pertinent positives and negatives as reviewed in the HPI. ____________________________________________   PHYSICAL  EXAM:  VITAL SIGNS: ED Triage Vitals [09/23/17 1843]  Enc Vitals Group     BP 121/90     Pulse Rate 99     Resp (!) 22     Temp 98.8 F (37.1 C)     Temp Source Oral     SpO2 96 %     Weight (!) 308 lb (139.7 kg)     Height 6' (1.829 m)     Head Circumference      Peak Flow      Pain Score 9     Pain Loc      Pain Edu?      Excl. in [place]?     Constitutional: Alert and oriented. Well appearing and in no acute distress. Eyes: Conjunctivae are normal. PERRL. EOMI. Head: Atraumatic. Nose: No congestion/rhinnorhea. Mouth/Throat: Mucous membranes are moist.  Oropharynx non-erythematous. Neck: No stridor.  No meningeal signs.   Cardiovascular: Normal rate, regular rhythm. Good peripheral circulation. Grossly normal heart sounds.   Respiratory: Normal respiratory effort.  No retractions. Lungs CTAB. Gastrointestinal: Soft and nontender. No distention.  Musculoskeletal: No lower extremity tenderness nor edema. No gross deformities of extremities.  Ecchymosis swelling and tenderness to his right small toe.  No pain with lateral compression of his foot.  No pain with range of motion or palpation of his ankle. Neurologic:  Normal speech and language. No gross focal neurologic deficits are appreciated.  Skin:  Skin is warm, dry and intact. No rash noted.   ____________________________________________   RADIOLOGY  Dg Foot Complete Right  Result Date: 09/23/2017 CLINICAL DATA:  Right fifth toe pain EXAM: RIGHT FOOT COMPLETE - 3+ VIEW COMPARISON:  None. FINDINGS: There is an oblique, nondisplaced fracture of the right fifth proximal phalanx. Fracture is extra-articular. Moderate soft tissue swelling. No dislocation. IMPRESSION: Oblique, nondisplaced, extra-articular fracture of the right fifth proximal phalanx. Electronically Signed   By: Ulyses Jarred M.D.   On: 09/23/2017 19:10    ____________________________________________   PROCEDURES  Procedure(s) performed:    Procedures   ____________________________________________   INITIAL IMPRESSION / ASSESSMENT AND PLAN / ED COURSE  Pertinent labs & imaging results that were available during my care of the patient were reviewed by me and considered in my medical decision making (see chart for details).  Toe.  Will buddy tape to give postop shoe along with ibuprofen.  Patient cannot take narcotics because he is on Suboxone.   ____________________________________________  FINAL CLINICAL IMPRESSION(S) / ED DIAGNOSES  Final diagnoses:  Closed nondisplaced fracture of proximal phalanx of lesser toe of right foot, initial encounter     MEDICATIONS GIVEN DURING THIS VISIT:  Medications  ibuprofen (ADVIL,MOTRIN) tablet 400 mg (not administered)     NEW OUTPATIENT MEDICATIONS STARTED DURING THIS VISIT:  This SmartLink is deprecated. Use AVSMEDLIST instead to display the medication list for a patient.  Note:  This document was prepared using Dragon voice recognition software and may include unintentional dictation errors.   Merrily Pew, MD 09/23/17 [phone]

## 2017-09-26 ENCOUNTER — Ambulatory Visit (HOSPITAL_COMMUNITY): Payer: Medicare Other | Attending: Pulmonary Disease | Admitting: Physical Therapy

## 2017-09-26 ENCOUNTER — Other Ambulatory Visit: Payer: Self-pay

## 2017-09-26 ENCOUNTER — Encounter (HOSPITAL_COMMUNITY): Payer: Self-pay | Admitting: Physical Therapy

## 2017-09-26 DIAGNOSIS — M5416 Radiculopathy, lumbar region: Secondary | ICD-10-CM | POA: Diagnosis not present

## 2017-09-26 DIAGNOSIS — R293 Abnormal posture: Secondary | ICD-10-CM | POA: Insufficient documentation

## 2017-09-26 DIAGNOSIS — M6281 Muscle weakness (generalized): Secondary | ICD-10-CM | POA: Diagnosis not present

## 2017-09-26 NOTE — Therapy (Signed)
[place], [place], [address] Phone: [phone]   Fax:  [phone]  Physical Therapy Evaluation  Patient Details  Name: [name] MRN: [id number] Date of Birth: 11/08/1976 Referring Provider: Sinda Du   Encounter Date: 09/26/2017  PT End of Session - 09/26/17 1208    Visit Number  1    Number of Visits  12    Date for PT Re-Evaluation  10/26/17    Authorization Type  [employer] medicare    Authorization - Visit Number  1    Authorization - Number of Visits  10    PT Start Time  9826    PT Stop Time  1115    PT Time Calculation (min)  40 min    Activity Tolerance  Patient tolerated treatment well    Behavior During Therapy  [facility] for tasks assessed/performed       Past Medical History:  Diagnosis Date  . Anxiety   . Asthma   . Bipolar 1 disorder ([place])   . Complication of anesthesia    Anxiety when he awaken with ET tube still in place  . GERD (gastroesophageal reflux disease)   . Hyperlipidemia   . Hypertension   . Hypoventilation syndrome   . Schizoaffective disorder ([place])    [place]    Past Surgical History:  Procedure Laterality Date  . ANKLE SURGERY    . APPENDECTOMY    . CHOLECYSTECTOMY    . ENDOSCOPIC SINUS SURGERY Left 01/24/2012   Performed by Izora Gala, MD at [place]  . ESOPHAGOGASTRODUODENOSCOPY  03/17/2008   RMR: A tiny distal esophageal erosions consistent with mild  erosive reflux esophagitis, otherwise normal esophagus, small hiatal hernia, minimally polypoid antral mucosa with doubtful clinical was significance.  Otherwise, normal stomach, patent pylorus, and normal D1-D2  . ESOPHAGOGASTRODUODENOSCOPY (EGD) WITH PROPOFOL N/A 02/18/2013   Performed by Daneil Dolin, MD at [facility]  . UMBILICAL HERNIA REPAIR      There were no vitals filed for this visit.   Subjective Assessment - 09/26/17 1042    Subjective  Pt states that he injured his back at work ten years ago; he  had no therapy after the injury.  He has been on disability since the surgery.  He had injections after the injury which helped but lately the pain has increased and gone down his right leg.  He is having numbness in both thighs.     Pertinent History  chronic back pain     How long can you sit comfortably?  Able to sit for 30 minutes     How long can you stand comfortably?  able to stand for 20 mintues    How long can you walk comfortably?  able to walk fo 20 minutes     Patient Stated Goals  less pain     Currently in Pain?  Yes    Pain Score  7     Pain Location  Back    Pain Orientation  Lower    Pain Descriptors / Indicators  Aching    Pain Type  Chronic pain    Pain Radiating Towards  Rt calves    Pain Onset  More than a month ago    Pain Frequency  Constant    Aggravating Factors   walk or stand     Pain Relieving Factors  pressure     Effect of Pain on Daily Activities  increases  [facility] PT Assessment - 09/26/17 0001      Assessment   Medical Diagnosis  lumbar radiculopathy     Referring Provider  Sinda Du    Onset Date/Surgical Date  09/16/17    Next MD Visit  not scheduled     Prior Therapy  none      Precautions   Precautions  None      Restrictions   Weight Bearing Restrictions  No      Balance Screen   Has the patient fallen in the past 6 months  No    Has the patient had a decrease in activity level because of a fear of falling?   Yes    Is the patient reluctant to leave their home because of a fear of falling?   No      Home Film/video editor residence    Home Access  Stairs to enter    Entrance Stairs-Number of Steps  4    Entrance Stairs-Rails  Right      Prior Function   Level of Independence  Independent    Vocation  On disability    Leisure  help around the house; walk his day, watch tv      Cognition   Overall Cognitive Status  Within Functional Limits for tasks assessed      Observation/Other  Assessments   Focus on Therapeutic Outcomes (FOTO)   56      Functional Tests   Functional tests  Single leg stance;Sit to Stand      Single Leg Stance   Comments  B 4 seconds       Sit to Stand   Comments  with UE support 5x shifts body to the left takes 32.36      Posture/Postural Control   Posture/Postural Control  Postural limitations    Postural Limitations  Forward head;Decreased lumbar lordosis;Decreased thoracic kyphosis      ROM / Strength   AROM / PROM / Strength  AROM;Strength      AROM   AROM Assessment Site  Lumbar    Lumbar Flexion  fingers to knees reps cause increase pain ; return from forward bend more painfull     Lumbar Extension  reps causes pain down pt leg ; functional       Strength   Strength Assessment Site  Hip;Knee;Ankle    Right/Left Hip  Right;Left    Right Hip Flexion  3-/5    Right Hip Extension  4-/5    Right Hip ABduction  3-/5    Left Hip Flexion  3-/5    Left Hip Extension  3/5    Left Hip ABduction  3+/5    Right/Left Knee  Right;Left    Right Knee Flexion  4+/5    Right Knee Extension  5/5    Left Knee Flexion  5/5    Left Knee Extension  5/5    Right/Left Ankle  Right;Left      Flexibility   Soft Tissue Assessment /Muscle Length  yes    Hamstrings  Lt: 135; RT       Ambulation/Gait   Ambulation/Gait  -- Deferred as pt has recently injured his Rt pinkie toe              Objective measurements completed on examination: See above findings.      [facility] Adult PT Treatment/Exercise - 09/26/17 0001      Exercises   Exercises  Lumbar  Lumbar Exercises: Stretches   Active Hamstring Stretch  2 reps;30 seconds      Lumbar Exercises: Supine   Ab Set  5 reps    Bridge  10 reps             PT Education - 09/26/17 1112    Education provided  Yes    Education Details  The importance of good posture and HEp     Person(s) Educated  Patient    Methods  Explanation    Comprehension  Verbalized understanding        PT Short Term Goals - 09/26/17 1214      PT SHORT TERM GOAL #1   Title  Pt radicular sx to be to mid thigh only to demonstrate decrease nerve irritation     Time  2 Due to holiday and transportation pt will not start next week     Period  Weeks    Status  New    Target Date  10/17/17      PT SHORT TERM GOAL #2   Title  Pt core and LE strength to be improved to allow pt to come from sit to stand with proper alignment     Time  2    Period  Weeks    Status  New      PT SHORT TERM GOAL #3   Title  Pt to be demonstrating good sitting and transitional posture to allow pt to decrease stress on his low back     Time  2    Period  Weeks    Status  New        PT Long Term Goals - 09/26/17 1218      PT LONG TERM GOAL #1   Title  Pt to have no radicular sx to show decrease nerve irritaiton    Time  4    Period  Weeks    Status  New    Target Date  10/31/17      PT LONG TERM GOAL #2   Title  Pt leg strength to be increased one grade B  to be able to come from sit to stand from lower height ie couch  low bed without difficulty     Time  4    Period  Weeks    Status  New      PT LONG TERM GOAL #3   Title  Pt to be completing a walking program for overall fitness and to decrease back pain to no greater than a 4/10     Time  4    Period  Weeks    Status  New      PT LONG TERM GOAL #4   Title  Pt to be able to demonstrate good lifting techniques to prevent injury to low back     Time  4    Period  Weeks    Status  New      PT LONG TERM GOAL #5   Title  Pt hip and back ROM to be improved to allow pt to be able to put shoes and socks on with minimal difficulty     Time  4    Period  Weeks    Status  New             Plan - 09/26/17 1209    Clinical Impression Statement  Mr. Wolford is a 41 yo who has had chronic back pain and has been on  disability for ten years.  He has never had physical therapy.  His back pain has increased to a point where he was having pain  going down his right leg therefore he sought medical attention who has referred him to out patient skilled therapy.  Examination demonstrates decreased ROM, decreased balance, decreased strength, decreased activity tolerance and  increased pain.  Mr. Fiallos will benefit from skilled physical therapy to address these issues and maximize his functional ability.     Clinical Presentation  Stable    Clinical Decision Making  Low    Rehab Potential  Fair    PT Frequency  3x / week    PT Duration  4 weeks    PT Treatment/Interventions  ADLs/Self Care Home Management;Therapeutic activities;Therapeutic exercise;Balance training;Neuromuscular re-education;Patient/family education    PT Next Visit Plan  Begin bent knee raises, clams, sidelying abduction and prone extension     PT Home Exercise Plan  Eval:  abset, bridge, hamstring stretches        Patient will benefit from skilled therapeutic intervention in order to improve the following deficits and impairments:  Abnormal gait, Decreased activity tolerance, Decreased balance, Decreased range of motion, Difficulty walking, Decreased strength, Impaired flexibility, Pain  Visit Diagnosis: Radiculopathy, lumbar region  Muscle weakness (generalized)  Abnormal posture  G-Codes - 09-30-2017 1221    Functional Assessment Tool Used (Outpatient Only)  foto    Functional Limitation  Mobility: Walking and moving around    Mobility: Walking and Moving Around Current Status [phone])  At least 40 percent but less than 60 percent impaired, limited or restricted    Mobility: Walking and Moving Around Goal Status [phone])  At least 20 percent but less than 40 percent impaired, limited or restricted        Problem List Patient Active Problem List   Diagnosis Date Noted  . Abdominal pain, epigastric 02/04/2013  . Constipation 12/02/2012  . Abdominal pain 11/22/2012  . INSOMNIA 04/26/2009  . BACK PAIN, LUMBAR 03/20/2009  . BIPOLAR AFFECTIVE DISORDER 02/17/2009   . OTHER ENTHESOPATHY OF ANKLE AND TARSUS 10/05/2008  . GERD 09/16/2008  . PERSONAL HISTORY OF SCHIZOPHRENIA 05/27/2008  . ESOPHAGITIS, REFLUX 03/23/2008  . HIATAL HERNIA 03/23/2008  . HYPERLIPIDEMIA 01/07/2008  . MORBID OBESITY 01/07/2008  . ANXIETY DEPRESSION 01/07/2008  . TOBACCO ABUSE 01/07/2008  . HYPERTENSION 01/07/2008  . ALLERGIC RHINITIS 01/07/2008  . IBS 01/07/2008   Rayetta Humphrey, PT CLT [phone] Sep 30, 2017, 12:22 PM  [facility] [address] [place], [place], [address] Phone: [phone]   Fax:  [phone]  Name: [name] MRN: [id number] Date of Birth: 02-13-1976

## 2017-09-26 NOTE — Patient Instructions (Addendum)
Hamstring Stretch: Active    Support behind right knee. Starting with knee bent, attempt to straighten knee until a comfortable stretch is felt in back of thigh. Hold _30___ seconds. Repeat _3___ times per set. Do 1____ sets per session. Do __2__ sessions per day.  http://orth.exer.us/158   Copyright  VHI. All rights reserved.  Isometric Abdominal    Lying on back with knees bent, tighten stomach by pressing elbows down. Hold __5_ seconds. Repeat _10___ times per set. Do __1__ sets per session. Do __2__ sessions per day.  http://orth.exer.us/1086   Copyright  VHI. All rights reserved.  Bridging    Slowly raise buttocks from floor, keeping stomach tight. Repeat __10__ times per set. Do __1__ sets per session. Do 2____ sessions per day.  http://orth.exer.us/1096   Copyright  VHI. All rights reserved.

## 2017-10-03 ENCOUNTER — Emergency Department (HOSPITAL_COMMUNITY): Payer: Medicare Other

## 2017-10-03 ENCOUNTER — Encounter (HOSPITAL_COMMUNITY): Payer: Self-pay

## 2017-10-03 ENCOUNTER — Emergency Department (HOSPITAL_COMMUNITY)
Admission: EM | Admit: 2017-10-03 | Discharge: 2017-10-03 | Disposition: A | Discharge status: Home / Self Care | Payer: Medicare Other | Attending: Emergency Medicine | Admitting: Emergency Medicine

## 2017-10-03 DIAGNOSIS — M79676 Pain in unspecified toe(s): Secondary | ICD-10-CM | POA: Diagnosis not present

## 2017-10-03 DIAGNOSIS — F17210 Nicotine dependence, cigarettes, uncomplicated: Secondary | ICD-10-CM | POA: Diagnosis not present

## 2017-10-03 DIAGNOSIS — M7989 Other specified soft tissue disorders: Secondary | ICD-10-CM | POA: Diagnosis not present

## 2017-10-03 DIAGNOSIS — W2209XD Striking against other stationary object, subsequent encounter: Secondary | ICD-10-CM | POA: Diagnosis not present

## 2017-10-03 DIAGNOSIS — J45909 Unspecified asthma, uncomplicated: Secondary | ICD-10-CM | POA: Insufficient documentation

## 2017-10-03 DIAGNOSIS — I10 Essential (primary) hypertension: Secondary | ICD-10-CM | POA: Insufficient documentation

## 2017-10-03 DIAGNOSIS — S92514D Nondisplaced fracture of proximal phalanx of right lesser toe(s), subsequent encounter for fracture with routine healing: Secondary | ICD-10-CM | POA: Insufficient documentation

## 2017-10-03 DIAGNOSIS — Z79899 Other long term (current) drug therapy: Secondary | ICD-10-CM | POA: Diagnosis not present

## 2017-10-03 DIAGNOSIS — S92504D Nondisplaced unspecified fracture of right lesser toe(s), subsequent encounter for fracture with routine healing: Secondary | ICD-10-CM | POA: Diagnosis not present

## 2017-10-03 MED ORDER — NAPROXEN 250 MG PO TABS
500.0000 mg | ORAL_TABLET | Freq: Once | ORAL | Status: AC
  Administered 2017-10-03: 500 mg via ORAL
  Filled 2017-10-03: qty 2

## 2017-10-03 NOTE — ED Notes (Signed)
Pt alert & oriented x4, stable gait. Patient given discharge instructions, paperwork & prescription(s). Patient  instructed to stop at the registration desk to finish any additional paperwork. Patient verbalized understanding. Pt left department w/ no further questions. 

## 2017-10-03 NOTE — ED Provider Notes (Signed)
[facility] EMERGENCY DEPARTMENT Provider Note   CSN: [id number] Arrival date & time: 10/03/17  0404  Time seen 05:05 AM   History   Chief Complaint Chief Complaint  Patient presents with  . recheck toe injury    HPI [name] is a 41 y.o. male.  HPI patient was seen initially on November 13 when he hit his right little toe while walking fast talking on the phone, stating he hit it on the door jam.  He states the toe was angled to the side and he popped it back over however he had continued pain came to the ED.  He was found to have a toe fracture.  He was buddy taped and placed in a postop shoe.  He did present tonight with the buddy tape in place however he is not wearing the postop shoe.  He states he has been elevating it and using ice.  He states he needs something stronger for pain.  At the time he was seen the ED physician documented patient was on Suboxone and therefore could not have narcotics.  Patient now presents with a letter stating he is off Suboxone.  He states he was referred to an orthopedist in [place] however he does not have transportation.  PCP Sinda Du, MD   Past Medical History:  Diagnosis Date  . Anxiety   . Asthma   . Bipolar 1 disorder ([place])   . Complication of anesthesia    Anxiety when he awaken with ET tube still in place  . GERD (gastroesophageal reflux disease)   . Hyperlipidemia   . Hypertension   . Hypoventilation syndrome   . Schizoaffective disorder ([place])    Day Mark    Patient Active Problem List   Diagnosis Date Noted  . Abdominal pain, epigastric 02/04/2013  . Constipation 12/02/2012  . Abdominal pain 11/22/2012  . INSOMNIA 04/26/2009  . BACK PAIN, LUMBAR 03/20/2009  . BIPOLAR AFFECTIVE DISORDER 02/17/2009  . OTHER ENTHESOPATHY OF ANKLE AND TARSUS 10/05/2008  . GERD 09/16/2008  . PERSONAL HISTORY OF SCHIZOPHRENIA 05/27/2008  . ESOPHAGITIS, REFLUX 03/23/2008  . HIATAL HERNIA 03/23/2008  . HYPERLIPIDEMIA  01/07/2008  . MORBID OBESITY 01/07/2008  . ANXIETY DEPRESSION 01/07/2008  . TOBACCO ABUSE 01/07/2008  . HYPERTENSION 01/07/2008  . ALLERGIC RHINITIS 01/07/2008  . IBS 01/07/2008    Past Surgical History:  Procedure Laterality Date  . ANKLE SURGERY    . APPENDECTOMY    . CHOLECYSTECTOMY    . ESOPHAGOGASTRODUODENOSCOPY  03/17/2008   RMR: A tiny distal esophageal erosions consistent with mild  erosive reflux esophagitis, otherwise normal esophagus, small hiatal hernia, minimally polypoid antral mucosa with doubtful clinical was significance.  Otherwise, normal stomach, patent pylorus, and normal D1-D2  . ESOPHAGOGASTRODUODENOSCOPY (EGD) WITH PROPOFOL N/A 02/18/2013   Procedure: ESOPHAGOGASTRODUODENOSCOPY (EGD) WITH PROPOFOL;  Surgeon: Daneil Dolin, MD;  Location: [facility];  Service: Endoscopy;  Laterality: N/A;  . NASAL SINUS SURGERY  01/24/2012   Procedure: ENDOSCOPIC SINUS SURGERY;  Surgeon: Izora Gala, MD;  Location: [facility];  Service: ENT;  Laterality: Left;  left ethmoidectomy, maxillary, frontal  . UMBILICAL HERNIA REPAIR         Home Medications    Prior to Admission medications   Medication Sig Start Date End Date Taking? Authorizing Provider  albuterol (PROVENTIL HFA;VENTOLIN HFA) 108 (90 BASE) MCG/ACT inhaler Inhale 1-2 puffs into the lungs every 6 (six) hours as needed for wheezing. 04/21/12 04/21/13  Prentiss Bells, MD  benzonatate [facility])  100 MG capsule Take 1 capsule (100 mg total) by mouth every 8 (eight) hours. 09/10/17   Rolland Porter, MD  benztropine (COGENTIN) 0.5 MG tablet Take 1 mg by mouth at bedtime.     [provider]  cetirizine (ZYRTEC) 10 MG tablet Take 10 mg by mouth every morning.    [provider]  Choline Fenofibrate 135 MG capsule Take 135 mg by mouth every morning.    [provider]  citalopram (CELEXA) 20 MG tablet Take 20 mg by mouth every morning.    [provider]  clindamycin (CLEOCIN) 150 MG capsule Take 1  capsule (150 mg total) by mouth every 6 (six) hours. 09/03/17   Lily Kocher, PA-C  dexamethasone (DECADRON) 4 MG tablet Take 1 tablet (4 mg total) by mouth 2 (two) times daily with a meal. 09/03/17   Lily Kocher, PA-C  dexlansoprazole (DEXILANT) 60 MG capsule Take 1 capsule (60 mg total) by mouth daily. 12/02/12   Annitta Needs, NP  dexlansoprazole (DEXILANT) 60 MG capsule Take 1 capsule (60 mg total) by mouth daily. 03/31/13   Annitta Needs, NP  dextromethorphan-guaiFENesin (ROBITUSSIN-DM) 10-100 MG/5ML liquid Take 10 mLs by mouth every 4 (four) hours as needed for cough. 09/03/17   Lily Kocher, PA-C  diazepam (VALIUM) 5 MG tablet Take 10 mg by mouth 3 (three) times daily as needed for anxiety. For anxiety    [provider]  doxepin (SINEQUAN) 150 MG capsule Take 150 mg by mouth at bedtime.    [provider]  doxycycline (VIBRAMYCIN) 100 MG capsule Take 1 capsule (100 mg total) by mouth 2 (two) times daily. 09/10/17   Rolland Porter, MD  EPINEPHrine (EPIPEN 2-PAK IJ) Inject as directed.    [provider]  gabapentin (NEURONTIN) 400 MG capsule Take 400 mg by mouth 2 (two) times daily.    [provider]  gabapentin (NEURONTIN) 800 MG tablet Take 800 mg by mouth at bedtime.     [provider]  haloperidol decanoate (HALDOL DECANOATE) 100 MG/ML injection Inject 150 mg into the muscle every 28 (twenty-eight) days.    [provider]  HYDROcodone-acetaminophen (NORCO) 10-325 MG per tablet Take 1 tablet by mouth every 6 (six) hours as needed for pain.     [provider]  ibuprofen (ADVIL,MOTRIN) 800 MG tablet Take 1 tablet (800 mg total) 3 (three) times daily by mouth. 09/23/17   Mesner, Corene Cornea, MD  LINZESS 290 MCG CAPS TAKE 1 CAPSULE BY MOUTH ONCE DAILY 06/08/13   Annitta Needs, NP  metoprolol (LOPRESSOR) 50 MG tablet Take 50 mg by mouth 2 (two) times daily.      [provider]  niacin (NIASPAN) 500 MG CR tablet Take 500 mg by  mouth at bedtime.      [provider]  oxyCODONE (ROXICODONE) 15 MG immediate release tablet Take 15 mg by mouth 4 (four) times daily.    [provider]  polyethylene glycol powder (GLYCOLAX/MIRALAX) powder Take 17 g by mouth daily. 12/02/12   Annitta Needs, NP  predniSONE (DELTASONE) 20 MG tablet Take 3 po QD x 3d , then 2 po QD x 3d then 1 po QD x 3d 09/10/17   Rolland Porter, MD  QUEtiapine (SEROQUEL) 100 MG tablet Take 100 mg by mouth 2 (two) times daily.    [provider]  tiZANidine (ZANAFLEX) 4 MG capsule Take 4 mg by mouth 3 (three) times daily.    [provider]  traZODone (DESYREL) 150 MG tablet Take 300 mg by mouth at bedtime.     [provider]    Family History Family History  Problem Relation Age of Onset  . Anesthesia problems Mother   . Colon cancer Maternal Grandfather   . Crohn's disease Maternal Grandfather     Social History Social History   Tobacco Use  . Smoking status: Current Every Day Smoker    Packs/day: 1.00    Years: 22.00    Pack years: 22.00    Types: Cigarettes  . Smokeless tobacco: Never Used  Substance Use Topics  . Alcohol use: No  . Drug use: No  lives in an apartment On disability    Allergies   Compazine; Imitrex [sumatriptan base]; Prochlorperazine edisylate; and Risperidone   Review of Systems Review of Systems  All other systems reviewed and are negative.    Physical Exam Updated Vital Signs BP 126/68 (BP Location: Right Arm)   Pulse 78   Temp 97.9 F (36.6 C) (Oral)   Resp 20   SpO2 95%   Vital signs normal    Physical Exam  Constitutional: He appears well-developed and well-nourished. No distress.  Congenial, wanting to talk about my holiday.  HENT:  Head: Normocephalic and atraumatic.  Right Ear: External ear normal.  Left Ear: External ear normal.  Nose: Nose normal.  Eyes: Conjunctivae and EOM are normal.  Neck: Normal range of motion.  Cardiovascular: Normal  rate.  Pulmonary/Chest: Effort normal. No stridor.  Musculoskeletal: Normal range of motion. He exhibits edema and tenderness.  Patient is noted to have some diffuse swelling of his left toe with some mild redness.  There is no evidence of a secondary infection, there is no open wounds.  He does have some tenderness.  Nursing note and vitals reviewed.    ED Treatments / Results  Labs (all labs ordered are listed, but only abnormal results are displayed) Labs Reviewed - No data to display  EKG  EKG Interpretation None       Radiology Dg Toe 5th Right  Result Date: 10/03/2017 CLINICAL DATA:  Patient broke his toe 10 days ago. No new injury. Continued pain. EXAM: RIGHT FIFTH TOE COMPARISON:  09/23/2017 FINDINGS: Oblique fracture of the midshaft right fifth proximal phalanx with mild displacement of fracture fragments. Overlying soft tissue swelling. No significant change since previous study. Joint spaces are preserved. No dislocation. IMPRESSION: Oblique fracture of the proximal phalanx right fifth toe again demonstrated. No significant change. Soft tissue swelling. Electronically Signed   By: Lucienne Capers M.D.   On: 10/03/2017 04:59    Procedures Procedures (including critical care time)  Medications Ordered in ED Medications  naproxen (NAPROSYN) tablet 500 mg (not administered)     Initial Impression / Assessment and Plan / ED Course  I have reviewed the triage vital signs and the nursing notes.  Pertinent labs & imaging results that were available during my care of the patient were reviewed by me and considered in my medical decision making (see chart for details).     Patient had blood work on October 31 with normal BUN and creatinine of 16 and 0.94.  He was started on ibuprofen for his pain.  I have advised the patient I cannot give him anything stronger for his pain.  He is high risk for over dose and abuse.  I did refer him to a local orthopedist so transportation  would not be a problem.   On review of the  [place] patient has multiple narcotic prescriptions over the past year including for #120 hydrocodone 7.5/325 and 10/325 mg tablets, in January, February, March, April, May, including one prescription filled on July 21 and the other on June 25, and then 120 oxycodone 10 mg tablets in July, #120 oxycodone 5 mg tablets in July, and #90 morphine sulfate 30 mg ER tablets in August, and then he was started on Suboxone in September and October and November.  He was started on #120 tramadol on November 14.  He also gets #120 alprazolam 1 mg tablets and #30 temazepam 30 mg tablets monthly.  His score is 850 out of a total of 1000 which puts him at high risk for overdose.  Final Clinical Impressions(s) / ED Diagnoses   Final diagnoses:  Closed nondisplaced fracture of proximal phalanx of lesser toe of right foot with routine healing, subsequent encounter    ED Discharge Orders    None    OTC ibuprofen  Plan discharge  Rolland Porter, MD, Barbette Or, MD 10/03/17 [phone]

## 2017-10-03 NOTE — ED Triage Notes (Signed)
Pt states he broke his toe 10 days ago, was seen here, taking tramadol for same.  Pt states the pain has worsened and he has not followed up with the orthopedist that he was referred to because he couldn't get there.  Pt has kept the toe buddy-taped.

## 2017-10-03 NOTE — Discharge Instructions (Addendum)
You can take ibuprofen 600 mg 4 times a day for pain. Elevate your foot more. You need to be wearing the post-op shoe. Follow up with Dr Aline Brochure, the orthopedist in [place], call his office to get an appointment. Unfortunately, you are on too many controlled medications to get anything stronger for pain that you already have.

## 2017-10-06 ENCOUNTER — Telehealth (HOSPITAL_COMMUNITY): Payer: Self-pay | Admitting: Pulmonary Disease

## 2017-10-06 ENCOUNTER — Encounter (HOSPITAL_COMMUNITY): Payer: Self-pay | Admitting: Physical Therapy

## 2017-10-06 ENCOUNTER — Ambulatory Visit (HOSPITAL_COMMUNITY): Payer: Medicare Other | Admitting: Physical Therapy

## 2017-10-06 DIAGNOSIS — R293 Abnormal posture: Secondary | ICD-10-CM | POA: Diagnosis not present

## 2017-10-06 DIAGNOSIS — M5416 Radiculopathy, lumbar region: Secondary | ICD-10-CM | POA: Diagnosis not present

## 2017-10-06 DIAGNOSIS — M6281 Muscle weakness (generalized): Secondary | ICD-10-CM

## 2017-10-06 NOTE — Patient Instructions (Addendum)
    PRONE ON ELBOWS - POE  Lying face down, slowly press up and prop yourself up on your elbows.  Hold for 2 minutes or more if this is helping your pain.  Repeat at least 2-3 times per day, or more as needed.

## 2017-10-06 NOTE — Therapy (Signed)
[place], [place], [address] Phone: [phone]   Fax:  [phone]  Physical Therapy Treatment  Patient Details  Name: [name] MRN: [id number] Date of Birth: 05-11-1976 Referring Provider: Sinda Du   Encounter Date: 10/06/2017  PT End of Session - 10/06/17 0940    Visit Number  2    Number of Visits  12    Date for PT Re-Evaluation  10/26/17    Authorization Type  [employer] medicare    Authorization - Visit Number  2    Authorization - Number of Visits  10    PT Start Time  0900    PT Stop Time  0940    PT Time Calculation (min)  40 min    Activity Tolerance  Patient tolerated treatment well;Patient limited by pain    Behavior During Therapy  [facility] for tasks assessed/performed       Past Medical History:  Diagnosis Date  . Anxiety   . Asthma   . Bipolar 1 disorder ([place])   . Complication of anesthesia    Anxiety when he awaken with ET tube still in place  . GERD (gastroesophageal reflux disease)   . Hyperlipidemia   . Hypertension   . Hypoventilation syndrome   . Schizoaffective disorder ([place])    [place]    Past Surgical History:  Procedure Laterality Date  . ANKLE SURGERY    . APPENDECTOMY    . CHOLECYSTECTOMY    . ESOPHAGOGASTRODUODENOSCOPY  03/17/2008   RMR: A tiny distal esophageal erosions consistent with mild  erosive reflux esophagitis, otherwise normal esophagus, small hiatal hernia, minimally polypoid antral mucosa with doubtful clinical was significance.  Otherwise, normal stomach, patent pylorus, and normal D1-D2  . ESOPHAGOGASTRODUODENOSCOPY (EGD) WITH PROPOFOL N/A 02/18/2013   Procedure: ESOPHAGOGASTRODUODENOSCOPY (EGD) WITH PROPOFOL;  Surgeon: Daneil Dolin, MD;  Location: [facility];  Service: Endoscopy;  Laterality: N/A;  . NASAL SINUS SURGERY  01/24/2012   Procedure: ENDOSCOPIC SINUS SURGERY;  Surgeon: Izora Gala, MD;  Location: [place];  Service: ENT;  Laterality: Left;   left ethmoidectomy, maxillary, frontal  . UMBILICAL HERNIA REPAIR      There were no vitals filed for this visit.  Subjective Assessment - 10/06/17 0905    Subjective  Patient arrives today reporting that he is getting an MRI in preparation for spine injections per his MD advice; he has having some issues with dizziness recently, he had a bad HA over the weekend, he notices quite a bit of unsteadiness as well. His BP has been running 120/60.    Patient Stated Goals  less pain     Currently in Pain?  Yes    Pain Score  6     Pain Location  Back    Pain Orientation  Right;Left;Lower    Pain Descriptors / Indicators  Aching    Pain Type  Chronic pain    Pain Radiating Towards  R LE on the back side all the way down, L LE on the front to knee     Pain Onset  More than a month ago    Pain Frequency  Constant    Aggravating Factors   walk or stand, sitting too long     Pain Relieving Factors  changing positions     Effect of Pain on Daily Activities  moderate- severe  [place] Adult PT Treatment/Exercise - 10/06/17 0001      Lumbar Exercises: Stretches   Pelvic Tilt  Other (comment) 10x2 second holds (arching and flattening)      Lumbar Exercises: Supine   Clam  10 reps;2 seconds    Bent Knee Raise  Other (comment) attempted, pain limited/increased radicular symptoms     Bridge  10 reps;2 seconds    Other Supine Lumbar Exercises  supine LE isometric extensions 1x10 B 3 second holds; supine hip ABD 1x5 cues for TA set      Other Supine Lumbar Exercises  review of correct bed mobility at begnning and end of session       Lumbar Exercises: Sidelying   Hip Abduction  5 reps;Other (comment) mod cues for form; stopped due to radicular symptoms       Lumbar Exercises: Prone   Straight Leg Raise  Other (comment) attempted, unable due to pain     Other Prone Lumbar Exercises  POE x2 minutes     Other Prone Lumbar Exercises  prone press ups 1x7               PT Education - 10/06/17 0940    Education provided  Yes    Education Details  productive pain vs radicular pain/when to stop and re-evaluate motion; initial eval/goals; review of bed mobiilty and rolling mechanics; exercise form; HEP update     Person(s) Educated  Patient    Methods  Explanation;Demonstration;Handout    Comprehension  Verbalized understanding;Returned demonstration       PT Short Term Goals - 09/26/17 1214      PT SHORT TERM GOAL #1   Title  Pt radicular sx to be to mid thigh only to demonstrate decrease nerve irritation     Time  2 Due to holiday and transportation pt will not start next week     Period  Weeks    Status  New    Target Date  10/17/17      PT SHORT TERM GOAL #2   Title  Pt core and LE strength to be improved to allow pt to come from sit to stand with proper alignment     Time  2    Period  Weeks    Status  New      PT SHORT TERM GOAL #3   Title  Pt to be demonstrating good sitting and transitional posture to allow pt to decrease stress on his low back     Time  2    Period  Weeks    Status  New        PT Long Term Goals - 09/26/17 1218      PT LONG TERM GOAL #1   Title  Pt to have no radicular sx to show decrease nerve irritaiton    Time  4    Period  Weeks    Status  New    Target Date  10/31/17      PT LONG TERM GOAL #2   Title  Pt leg strength to be increased one grade B  to be able to come from sit to stand from lower height ie couch  low bed without difficulty     Time  4    Period  Weeks    Status  New      PT LONG TERM GOAL #3   Title  Pt to be completing a walking program for overall fitness and to decrease back  pain to no greater than a 4/10     Time  4    Period  Weeks    Status  New      PT LONG TERM GOAL #4   Title  Pt to be able to demonstrate good lifting techniques to prevent injury to low back     Time  4    Period  Weeks    Status  New      PT LONG TERM GOAL #5   Title  Pt hip and back ROM  to be improved to allow pt to be able to put shoes and socks on with minimal difficulty     Time  4    Period  Weeks    Status  New            Plan - 10/06/17 0941    Clinical Impression Statement  Reviewed initial eval and goals, also provided additional copy of HEP per patient request. Initiated stabilization program as indicated in PT POC, with adjustments and new exercises attempted as some exercises (indicated in flowsheets) did increase radicular symptoms today. Patient pain limited throughout session but appears motivated to participate in PT. Education provided throughout session regarding appropriate pain response and avoiding increase of radicular symptoms with exercises.     Rehab Potential  Fair    PT Frequency  3x / week    PT Duration  4 weeks    PT Treatment/Interventions  ADLs/Self Care Home Management;Therapeutic activities;Therapeutic exercise;Balance training;Neuromuscular re-education;Patient/family education    PT Next Visit Plan  focus on extension based program as flexion movements seem to aggravate pain; contniue core and stabiilty exercises; continue to try to progress POE and prone press up activities; trial standing Y on wall     PT Home Exercise Plan  Eval:  abset, bridge, hamstring stretches  11/26- POE     Consulted and Agree with Plan of Care  Patient       Patient will benefit from skilled therapeutic intervention in order to improve the following deficits and impairments:  Abnormal gait, Decreased activity tolerance, Decreased balance, Decreased range of motion, Difficulty walking, Decreased strength, Impaired flexibility, Pain  Visit Diagnosis: Radiculopathy, lumbar region  Muscle weakness (generalized)  Abnormal posture     Problem List Patient Active Problem List   Diagnosis Date Noted  . Abdominal pain, epigastric 02/04/2013  . Constipation 12/02/2012  . Abdominal pain 11/22/2012  . INSOMNIA 04/26/2009  . BACK PAIN, LUMBAR 03/20/2009  .  BIPOLAR AFFECTIVE DISORDER 02/17/2009  . OTHER ENTHESOPATHY OF ANKLE AND TARSUS 10/05/2008  . GERD 09/16/2008  . PERSONAL HISTORY OF SCHIZOPHRENIA 05/27/2008  . ESOPHAGITIS, REFLUX 03/23/2008  . HIATAL HERNIA 03/23/2008  . HYPERLIPIDEMIA 01/07/2008  . MORBID OBESITY 01/07/2008  . ANXIETY DEPRESSION 01/07/2008  . TOBACCO ABUSE 01/07/2008  . HYPERTENSION 01/07/2008  . ALLERGIC RHINITIS 01/07/2008  . IBS 01/07/2008    Deniece Ree PT, DPT, CBIS  Supplemental Physical Therapist [facility] [address] [place], [place], [address] Phone: [phone]   Fax:  [phone]  Name: [name] MRN: [id number] Date of Birth: 06/20/1976

## 2017-10-06 NOTE — Telephone Encounter (Signed)
10/06/17  Pt said to cancel the 11/28 appt because he has an MRI scheduled

## 2017-10-07 ENCOUNTER — Ambulatory Visit
Admission: RE | Admit: 2017-10-07 | Discharge: 2017-10-07 | Disposition: A | Discharge status: Home / Self Care | Payer: Medicare Other | Source: Ambulatory Visit | Attending: Pulmonary Disease | Admitting: Pulmonary Disease

## 2017-10-07 DIAGNOSIS — M48061 Spinal stenosis, lumbar region without neurogenic claudication: Secondary | ICD-10-CM | POA: Diagnosis not present

## 2017-10-07 DIAGNOSIS — M545 Low back pain: Secondary | ICD-10-CM

## 2017-10-08 ENCOUNTER — Encounter (HOSPITAL_COMMUNITY): Payer: Self-pay

## 2017-10-10 ENCOUNTER — Telehealth (HOSPITAL_COMMUNITY): Payer: Self-pay

## 2017-10-10 ENCOUNTER — Ambulatory Visit (HOSPITAL_COMMUNITY): Payer: Medicare Other

## 2017-10-10 DIAGNOSIS — R0602 Shortness of breath: Secondary | ICD-10-CM | POA: Diagnosis not present

## 2017-10-10 DIAGNOSIS — J441 Chronic obstructive pulmonary disease with (acute) exacerbation: Secondary | ICD-10-CM | POA: Diagnosis not present

## 2017-10-10 NOTE — Telephone Encounter (Signed)
Patient did not show for appointment. PT attempted to call patient. Phone was answered, background conversation heard, but no one speaking on phone. PT could overhear two men arguing with profanity. Unable to reach patient.   10:15 AM, 10/10/17 Etta Grandchild, PT, DPT Physical Therapist at [place] [phone] (office)

## 2017-10-11 ENCOUNTER — Encounter (HOSPITAL_COMMUNITY): Payer: Self-pay

## 2017-10-11 ENCOUNTER — Emergency Department (HOSPITAL_COMMUNITY): Payer: Medicare Other

## 2017-10-11 ENCOUNTER — Emergency Department (HOSPITAL_COMMUNITY)
Admission: EM | Admit: 2017-10-11 | Discharge: 2017-10-11 | Discharge status: Against Medical Advice | Payer: Medicare Other | Source: Home / Self Care | Attending: Emergency Medicine | Admitting: Emergency Medicine

## 2017-10-11 ENCOUNTER — Encounter (HOSPITAL_COMMUNITY): Payer: Self-pay | Admitting: Emergency Medicine

## 2017-10-11 ENCOUNTER — Emergency Department (HOSPITAL_COMMUNITY)
Admission: EM | Admit: 2017-10-11 | Discharge: 2017-10-11 | Disposition: A | Discharge status: Home / Self Care | Payer: Medicare Other | Attending: Emergency Medicine | Admitting: Emergency Medicine

## 2017-10-11 ENCOUNTER — Other Ambulatory Visit: Payer: Self-pay

## 2017-10-11 DIAGNOSIS — F17210 Nicotine dependence, cigarettes, uncomplicated: Secondary | ICD-10-CM | POA: Insufficient documentation

## 2017-10-11 DIAGNOSIS — Z79899 Other long term (current) drug therapy: Secondary | ICD-10-CM

## 2017-10-11 DIAGNOSIS — I10 Essential (primary) hypertension: Secondary | ICD-10-CM

## 2017-10-11 DIAGNOSIS — J45909 Unspecified asthma, uncomplicated: Secondary | ICD-10-CM | POA: Insufficient documentation

## 2017-10-11 DIAGNOSIS — M79605 Pain in left leg: Secondary | ICD-10-CM | POA: Insufficient documentation

## 2017-10-11 DIAGNOSIS — M25579 Pain in unspecified ankle and joints of unspecified foot: Secondary | ICD-10-CM | POA: Diagnosis not present

## 2017-10-11 DIAGNOSIS — L03116 Cellulitis of left lower limb: Secondary | ICD-10-CM | POA: Insufficient documentation

## 2017-10-11 DIAGNOSIS — M7989 Other specified soft tissue disorders: Secondary | ICD-10-CM | POA: Diagnosis not present

## 2017-10-11 LAB — BASIC METABOLIC PANEL
ANION GAP: 6 (ref 5–15)
BUN: 9 mg/dL (ref 6–20)
CALCIUM: 9.1 mg/dL (ref 8.9–10.3)
CHLORIDE: 110 mmol/L (ref 101–111)
CO2: 22 mmol/L (ref 22–32)
Creatinine, Ser: 0.65 mg/dL (ref 0.61–1.24)
GFR calc non Af Amer: >60 mL/min (ref >60)
GLUCOSE: 103 mg/dL — AB (ref 65–99)
POTASSIUM: 3.5 mmol/L (ref 3.5–5.1)
Sodium: 138 mmol/L (ref 135–145)

## 2017-10-11 LAB — CBC WITH DIFFERENTIAL/PLATELET
BASOS ABS: 0 10*3/uL (ref 0.0–0.1)
BASOS PCT: 0 %
Eosinophils Absolute: 0.2 10*3/uL (ref 0.0–0.7)
Eosinophils Relative: 1 %
HEMATOCRIT: 42.6 % (ref 39.0–52.0)
HEMOGLOBIN: 14.3 g/dL (ref 13.0–17.0)
LYMPHS PCT: 25 %
Lymphs Abs: 2.6 10*3/uL (ref 0.7–4.0)
MCH: 29.1 pg (ref 26.0–34.0)
MCHC: 33.6 g/dL (ref 30.0–36.0)
MCV: 86.8 fL (ref 78.0–100.0)
Monocytes Absolute: 1 10*3/uL (ref 0.1–1.0)
Monocytes Relative: 9 %
NEUTROS ABS: 6.9 10*3/uL (ref 1.7–7.7)
NEUTROS PCT: 65 %
Platelets: 228 10*3/uL (ref 150–400)
RBC: 4.91 MIL/uL (ref 4.22–5.81)
RDW: 15.2 % (ref 11.5–15.5)
WBC: 10.7 10*3/uL — ABNORMAL HIGH (ref 4.0–10.5)

## 2017-10-11 MED ORDER — HYDROCODONE-ACETAMINOPHEN 5-325 MG PO TABS: 1.0000 | ORAL_TABLET | Freq: Four times a day (QID) | ORAL | 0 refills | Status: DC | PRN

## 2017-10-11 MED ORDER — DOXYCYCLINE HYCLATE 100 MG PO CAPS: 100.0000 mg | ORAL_CAPSULE | Freq: Two times a day (BID) | ORAL | 0 refills | Status: DC

## 2017-10-11 MED ORDER — ACETAMINOPHEN 325 MG PO TABS
650.0000 mg | ORAL_TABLET | Freq: Once | ORAL | Status: AC
  Administered 2017-10-11: 650 mg via ORAL
  Filled 2017-10-11: qty 2

## 2017-10-11 MED ORDER — DOXYCYCLINE HYCLATE 100 MG PO TABS
100.0000 mg | ORAL_TABLET | Freq: Once | ORAL | Status: AC
  Administered 2017-10-11: 100 mg via ORAL
  Filled 2017-10-11: qty 1

## 2017-10-11 MED ORDER — ACETAMINOPHEN 325 MG PO TABS: 650.0000 mg | ORAL_TABLET | Freq: Once | ORAL | Status: DC

## 2017-10-11 MED ORDER — HYDROMORPHONE HCL 2 MG/ML IJ SOLN
2.0000 mg | Freq: Once | INTRAMUSCULAR | Status: AC
  Administered 2017-10-11: 2 mg via INTRAMUSCULAR
  Filled 2017-10-11: qty 1

## 2017-10-11 NOTE — Discharge Instructions (Signed)
Take antibiotics as directed.  Take pain medicine as needed.  Would expect improvement over the next couple days.  Return for any new or worse symptoms.  Elevate left leg is much as possible.  This is consistent with a cellulitis no real concern for blood clot.

## 2017-10-11 NOTE — ED Notes (Signed)
Pt left AMA did not want to stay. Did not want to sign.

## 2017-10-11 NOTE — ED Provider Notes (Signed)
[facility] EMERGENCY DEPARTMENT Provider Note   CSN: [id number] Arrival date & time: 10/11/17  1509     History   Chief Complaint Chief Complaint  Patient presents with  . Leg Pain    HPI [name] is a 41 y.o. male.  Patient with complaint of left ankle lower leg pain starting yesterday with swelling and then today developed redness.  Patient came here about 5 in the morning to be seen but wait was too long and left.  Patient denies any fevers or injury to the leg.  No history of any similar problems.  Patient states the pain is 10 out of 10.  Patient does have a past history of behavioral health issues.  Patient very cooperative here.      Past Medical History:  Diagnosis Date  . Anxiety   . Asthma   . Bipolar 1 disorder ([place])   . Complication of anesthesia    Anxiety when he awaken with ET tube still in place  . GERD (gastroesophageal reflux disease)   . Hyperlipidemia   . Hypertension   . Hypoventilation syndrome   . Schizoaffective disorder ([place])    [place]    Patient Active Problem List   Diagnosis Date Noted  . Abdominal pain, epigastric 02/04/2013  . Constipation 12/02/2012  . Abdominal pain 11/22/2012  . INSOMNIA 04/26/2009  . BACK PAIN, LUMBAR 03/20/2009  . BIPOLAR AFFECTIVE DISORDER 02/17/2009  . OTHER ENTHESOPATHY OF ANKLE AND TARSUS 10/05/2008  . GERD 09/16/2008  . PERSONAL HISTORY OF SCHIZOPHRENIA 05/27/2008  . ESOPHAGITIS, REFLUX 03/23/2008  . HIATAL HERNIA 03/23/2008  . HYPERLIPIDEMIA 01/07/2008  . MORBID OBESITY 01/07/2008  . ANXIETY DEPRESSION 01/07/2008  . TOBACCO ABUSE 01/07/2008  . HYPERTENSION 01/07/2008  . ALLERGIC RHINITIS 01/07/2008  . IBS 01/07/2008    Past Surgical History:  Procedure Laterality Date  . ANKLE SURGERY    . APPENDECTOMY    . CHOLECYSTECTOMY    . ESOPHAGOGASTRODUODENOSCOPY  03/17/2008   RMR: A tiny distal esophageal erosions consistent with mild  erosive reflux esophagitis, otherwise normal  esophagus, small hiatal hernia, minimally polypoid antral mucosa with doubtful clinical was significance.  Otherwise, normal stomach, patent pylorus, and normal D1-D2  . ESOPHAGOGASTRODUODENOSCOPY (EGD) WITH PROPOFOL N/A 02/18/2013   Procedure: ESOPHAGOGASTRODUODENOSCOPY (EGD) WITH PROPOFOL;  Surgeon: Daneil Dolin, MD;  Location: [facility];  Service: Endoscopy;  Laterality: N/A;  . NASAL SINUS SURGERY  01/24/2012   Procedure: ENDOSCOPIC SINUS SURGERY;  Surgeon: Izora Gala, MD;  Location: [facility];  Service: ENT;  Laterality: Left;  left ethmoidectomy, maxillary, frontal  . UMBILICAL HERNIA REPAIR         Home Medications    Prior to Admission medications   Medication Sig Start Date End Date Taking? Authorizing Provider  albuterol (PROVENTIL HFA;VENTOLIN HFA) 108 (90 BASE) MCG/ACT inhaler Inhale 1-2 puffs into the lungs every 6 (six) hours as needed for wheezing. 04/21/12 04/21/13  Prentiss Bells, MD  ALPRAZolam Duanne Moron) 1 MG tablet Take 1 mg by mouth at bedtime as needed for anxiety.    [provider]  benzonatate (TESSALON) 100 MG capsule Take 1 capsule (100 mg total) by mouth every 8 (eight) hours. 09/10/17   Rolland Porter, MD  benztropine (COGENTIN) 0.5 MG tablet Take 1 mg by mouth at bedtime.     [provider]  cetirizine (ZYRTEC) 10 MG tablet Take 10 mg by mouth every morning.    [provider]  Choline Fenofibrate 135 MG capsule Take  135 mg by mouth every morning.    [provider]  citalopram (CELEXA) 20 MG tablet Take 20 mg by mouth every morning.    [provider]  clindamycin (CLEOCIN) 150 MG capsule Take 1 capsule (150 mg total) by mouth every 6 (six) hours. Patient not taking: Reported on 10/06/2017 09/03/17   Lily Kocher, PA-C  dexamethasone (DECADRON) 4 MG tablet Take 1 tablet (4 mg total) by mouth 2 (two) times daily with a meal. Patient not taking: Reported on 10/06/2017 09/03/17   Lily Kocher, PA-C  dexlansoprazole (DEXILANT)  60 MG capsule Take 1 capsule (60 mg total) by mouth daily. Patient not taking: Reported on 10/06/2017 12/02/12   Annitta Needs, NP  dexlansoprazole (DEXILANT) 60 MG capsule Take 1 capsule (60 mg total) by mouth daily. Patient not taking: Reported on 10/06/2017 03/31/13   Annitta Needs, NP  dextromethorphan-guaiFENesin (ROBITUSSIN-DM) 10-100 MG/5ML liquid Take 10 mLs by mouth every 4 (four) hours as needed for cough. Patient not taking: Reported on 10/06/2017 09/03/17   Lily Kocher, PA-C  diazepam (VALIUM) 5 MG tablet Take 10 mg by mouth 3 (three) times daily as needed for anxiety. For anxiety    [provider]  doxepin (SINEQUAN) 150 MG capsule Take 150 mg by mouth at bedtime.    [provider]  doxycycline (VIBRAMYCIN) 100 MG capsule Take 1 capsule (100 mg total) by mouth 2 (two) times daily. Patient not taking: Reported on 10/06/2017 09/10/17   Rolland Porter, MD  doxycycline (VIBRAMYCIN) 100 MG capsule Take 1 capsule (100 mg total) by mouth 2 (two) times daily. 10/11/17   Fredia Sorrow, MD  EPINEPHrine (EPIPEN 2-PAK IJ) Inject as directed.    [provider]  gabapentin (NEURONTIN) 400 MG capsule Take 400 mg by mouth 2 (two) times daily.    [provider]  gabapentin (NEURONTIN) 800 MG tablet Take 800 mg by mouth at bedtime.     [provider]  haloperidol decanoate (HALDOL DECANOATE) 100 MG/ML injection Inject 150 mg into the muscle every 28 (twenty-eight) days.    [provider]  HYDROcodone-acetaminophen (NORCO) 10-325 MG per tablet Take 1 tablet by mouth every 6 (six) hours as needed for pain.     [provider]  HYDROcodone-acetaminophen (NORCO/VICODIN) 5-325 MG tablet Take 1-2 tablets by mouth every 6 (six) hours as needed. 10/11/17   Fredia Sorrow, MD  ibuprofen (ADVIL,MOTRIN) 800 MG tablet Take 1 tablet (800 mg total) 3 (three) times daily by mouth. 09/23/17   Mesner, Corene Cornea, MD  LINZESS 290 MCG CAPS TAKE 1 CAPSULE BY  MOUTH ONCE DAILY Patient not taking: Reported on 10/06/2017 06/08/13   Annitta Needs, NP  metoprolol (LOPRESSOR) 50 MG tablet Take 50 mg by mouth 2 (two) times daily.      [provider]  niacin (NIASPAN) 500 MG CR tablet Take 500 mg by mouth at bedtime.      [provider]  oxyCODONE (ROXICODONE) 15 MG immediate release tablet Take 15 mg by mouth 4 (four) times daily.    [provider]  pantoprazole (PROTONIX) 40 MG tablet Take 40 mg by mouth daily.    [provider]  polyethylene glycol powder (GLYCOLAX/MIRALAX) powder Take 17 g by mouth daily. Patient not taking: Reported on 10/06/2017 12/02/12   Annitta Needs, NP  predniSONE (DELTASONE) 20 MG tablet Take 3 po QD x 3d , then 2 po QD x 3d then 1 po QD x 3d Patient not taking:  Reported on 10/06/2017 09/10/17   Rolland Porter, MD  QUEtiapine (SEROQUEL) 100 MG tablet Take 100 mg by mouth 2 (two) times daily.    [provider]  tiZANidine (ZANAFLEX) 4 MG capsule Take 4 mg by mouth 3 (three) times daily.    [provider]  traMADol (ULTRAM) 50 MG tablet Take by mouth every 6 (six) hours as needed.    [provider]  traZODone (DESYREL) 150 MG tablet Take 300 mg by mouth at bedtime.     [provider]    Family History Family History  Problem Relation Age of Onset  . Anesthesia problems Mother   . Colon cancer Maternal Grandfather   . Crohn's disease Maternal Grandfather     Social History Social History   Tobacco Use  . Smoking status: Current Every Day Smoker    Packs/day: 1.00    Years: 22.00    Pack years: 22.00    Types: Cigarettes  . Smokeless tobacco: Never Used  Substance Use Topics  . Alcohol use: No  . Drug use: No     Allergies   Compazine; Imitrex [sumatriptan base]; Prochlorperazine edisylate; and Risperidone   Review of Systems Review of Systems  Constitutional: Negative for fever.  HENT: Negative for congestion.   Eyes: Negative for  redness.  Respiratory: Negative for shortness of breath.   Cardiovascular: Negative for chest pain.  Gastrointestinal: Negative for abdominal pain.  Genitourinary: Negative for dysuria.  Musculoskeletal: Negative for back pain, joint swelling and neck pain.  Skin: Negative for wound.  Neurological: Negative for headaches.  Hematological: Does not bruise/bleed easily.  Psychiatric/Behavioral: Positive for behavioral problems. Negative for confusion.     Physical Exam Updated Vital Signs BP 131/82 (BP Location: Left Arm)   Pulse 92   Temp 98.4 F (36.9 C) (Oral)   Resp 18   Ht 1.905 m (6\' 3" )   Wt (!) 139.3 kg (307 lb)   SpO2 96%   BMI 38.37 kg/m   Physical Exam  Constitutional: He is oriented to person, place, and time. He appears well-developed and well-nourished.  HENT:  Head: Normocephalic and atraumatic.  Eyes: EOM are normal. Pupils are equal, round, and reactive to light.  Neck: Neck supple.  Cardiovascular: Normal rate, regular rhythm and normal heart sounds.  Pulmonary/Chest: Effort normal and breath sounds normal. No respiratory distress.  Abdominal: Soft. Bowel sounds are normal. There is no tenderness.  Musculoskeletal: He exhibits edema and tenderness.  Lower extremities normal other than the left lower leg that has some area of erythema and some swelling and increased warmth.  Goes from ankle to the lower one third of the leg.  No streaking.  No calf tenderness.  Good cap refill to the toes distally.  Dorsalis pedis pulses 2+.  Sensation intact.  Right foot right foot notable that the fourth and fifth toe were buddy taped together.  Due to a chronic injury.  Neurological: He is alert and oriented to person, place, and time. No cranial nerve deficit or sensory deficit. He exhibits normal muscle tone. Coordination normal.  Skin: Skin is warm. There is erythema.  Nursing note and vitals reviewed.    ED Treatments / Results  Labs (all labs ordered are listed, but  only abnormal results are displayed) Labs Reviewed  CBC WITH DIFFERENTIAL/PLATELET - Abnormal; Notable for the following components:      Result Value   WBC 10.7 (*)    All other components within normal limits  BASIC METABOLIC  PANEL - Abnormal; Notable for the following components:   Glucose, Bld 103 (*)    All other components within normal limits    EKG  EKG Interpretation None       Radiology Dg Tibia/fibula Left  Result Date: 10/11/2017 CLINICAL DATA:  Pain with swelling and redness. No history of injury. EXAM: LEFT TIBIA AND FIBULA - 2 VIEW COMPARISON:  None. FINDINGS: No fracture. No worrisome lytic or sclerotic osseous abnormality. Overlying soft tissues are unremarkable. IMPRESSION: Negative. Electronically Signed   By: Misty Stanley M.D.   On: 10/11/2017 16:41    Procedures Procedures (including critical care time)  Medications Ordered in ED Medications  doxycycline (VIBRA-TABS) tablet 100 mg (not administered)  HYDROmorphone (DILAUDID) injection 2 mg (2 mg Intramuscular Given 10/11/17 1556)     Initial Impression / Assessment and Plan / ED Course  I have reviewed the triage vital signs and the nursing notes.  Pertinent labs & imaging results that were available during my care of the patient were reviewed by me and considered in my medical decision making (see chart for details).    Patient with some early cellulitis to the left lower leg ankle area.  Will treated with doxycycline.  Good pain control here.  Will continue pain control at home.  Will follow up with his primary care doctor.  Will return for any new or worse symptoms.  Labs without any significant abnormalities.  No significant leukocytosis.   Final Clinical Impressions(s) / ED Diagnoses   Final diagnoses:  Cellulitis of left lower extremity    ED Discharge Orders        Ordered    doxycycline (VIBRAMYCIN) 100 MG capsule  2 times daily     10/11/17 1736    HYDROcodone-acetaminophen  (NORCO/VICODIN) 5-325 MG tablet  Every 6 hours PRN     10/11/17 1736       Fredia Sorrow, MD 10/11/17 1744

## 2017-10-11 NOTE — ED Triage Notes (Signed)
Pt c/o lle pain/swelling/redness since yesterday.

## 2017-10-11 NOTE — ED Triage Notes (Signed)
Pt woke up (0100) with left lower leg pain. Pt took Ultram 50.

## 2017-10-11 NOTE — ED Provider Notes (Signed)
[facility] EMERGENCY DEPARTMENT Provider Note   CSN: [id number] Arrival date & time: 10/11/17  4098     History   Chief Complaint Chief Complaint  Patient presents with  . Leg Pain    left    HPI [name] is a 41 y.o. male.  Patient awoke from sleep with left lower leg pain.  Denies any trauma.  Seen last week for a toe fracture on the right foot but states that is doing fine.  Denies any new injury.  Complains of pain to his left anterior ankle that woke him from sleep.  He called the ambulance because he could not walk.  Denies any weakness, numbness or tingling.  No fever.  No open wounds.  He took some tramadol at home with partial relief.   The history is provided by the patient and the EMS personnel.  Leg Pain      Past Medical History:  Diagnosis Date  . Anxiety   . Asthma   . Bipolar 1 disorder ([place])   . Complication of anesthesia    Anxiety when he awaken with ET tube still in place  . GERD (gastroesophageal reflux disease)   . Hyperlipidemia   . Hypertension   . Hypoventilation syndrome   . Schizoaffective disorder ([place])    [place]    Patient Active Problem List   Diagnosis Date Noted  . Abdominal pain, epigastric 02/04/2013  . Constipation 12/02/2012  . Abdominal pain 11/22/2012  . INSOMNIA 04/26/2009  . BACK PAIN, LUMBAR 03/20/2009  . BIPOLAR AFFECTIVE DISORDER 02/17/2009  . OTHER ENTHESOPATHY OF ANKLE AND TARSUS 10/05/2008  . GERD 09/16/2008  . PERSONAL HISTORY OF SCHIZOPHRENIA 05/27/2008  . ESOPHAGITIS, REFLUX 03/23/2008  . HIATAL HERNIA 03/23/2008  . HYPERLIPIDEMIA 01/07/2008  . MORBID OBESITY 01/07/2008  . ANXIETY DEPRESSION 01/07/2008  . TOBACCO ABUSE 01/07/2008  . HYPERTENSION 01/07/2008  . ALLERGIC RHINITIS 01/07/2008  . IBS 01/07/2008    Past Surgical History:  Procedure Laterality Date  . ANKLE SURGERY    . APPENDECTOMY    . CHOLECYSTECTOMY    . ESOPHAGOGASTRODUODENOSCOPY  03/17/2008   RMR: A tiny distal  esophageal erosions consistent with mild  erosive reflux esophagitis, otherwise normal esophagus, small hiatal hernia, minimally polypoid antral mucosa with doubtful clinical was significance.  Otherwise, normal stomach, patent pylorus, and normal D1-D2  . ESOPHAGOGASTRODUODENOSCOPY (EGD) WITH PROPOFOL N/A 02/18/2013   Procedure: ESOPHAGOGASTRODUODENOSCOPY (EGD) WITH PROPOFOL;  Surgeon: Daneil Dolin, MD;  Location: [facility];  Service: Endoscopy;  Laterality: N/A;  . NASAL SINUS SURGERY  01/24/2012   Procedure: ENDOSCOPIC SINUS SURGERY;  Surgeon: Izora Gala, MD;  Location: [place];  Service: ENT;  Laterality: Left;  left ethmoidectomy, maxillary, frontal  . UMBILICAL HERNIA REPAIR         Home Medications    Prior to Admission medications   Medication Sig Start Date End Date Taking? Authorizing Provider  ALPRAZolam Duanne Moron) 1 MG tablet Take 1 mg by mouth at bedtime as needed for anxiety.   Yes [provider]  citalopram (CELEXA) 20 MG tablet Take 20 mg by mouth every morning.   Yes [provider]  pantoprazole (PROTONIX) 40 MG tablet Take 40 mg by mouth daily.   Yes [provider]  QUEtiapine (SEROQUEL) 100 MG tablet Take 100 mg by mouth 2 (two) times daily.   Yes [provider]  traMADol (ULTRAM) 50 MG tablet Take by mouth every 6 (six) hours as needed.   Yes [provider]  albuterol (PROVENTIL HFA;VENTOLIN HFA) 108 (90 BASE) MCG/ACT inhaler Inhale 1-2 puffs into the lungs every 6 (six) hours as needed for wheezing. 04/21/12 04/21/13  Prentiss Bells, MD  benzonatate (TESSALON) 100 MG capsule Take 1 capsule (100 mg total) by mouth every 8 (eight) hours. 09/10/17   Rolland Porter, MD  benztropine (COGENTIN) 0.5 MG tablet Take 1 mg by mouth at bedtime.     [provider]  cetirizine (ZYRTEC) 10 MG tablet Take 10 mg by mouth every morning.    [provider]  Choline Fenofibrate 135 MG capsule Take 135 mg by mouth every morning.     [provider]  clindamycin (CLEOCIN) 150 MG capsule Take 1 capsule (150 mg total) by mouth every 6 (six) hours. Patient not taking: Reported on 10/06/2017 09/03/17   Lily Kocher, PA-C  dexamethasone (DECADRON) 4 MG tablet Take 1 tablet (4 mg total) by mouth 2 (two) times daily with a meal. Patient not taking: Reported on 10/06/2017 09/03/17   Lily Kocher, PA-C  dexlansoprazole (DEXILANT) 60 MG capsule Take 1 capsule (60 mg total) by mouth daily. Patient not taking: Reported on 10/06/2017 12/02/12   Annitta Needs, NP  dexlansoprazole (DEXILANT) 60 MG capsule Take 1 capsule (60 mg total) by mouth daily. Patient not taking: Reported on 10/06/2017 03/31/13   Annitta Needs, NP  dextromethorphan-guaiFENesin (ROBITUSSIN-DM) 10-100 MG/5ML liquid Take 10 mLs by mouth every 4 (four) hours as needed for cough. Patient not taking: Reported on 10/06/2017 09/03/17   Lily Kocher, PA-C  diazepam (VALIUM) 5 MG tablet Take 10 mg by mouth 3 (three) times daily as needed for anxiety. For anxiety    [provider]  doxepin (SINEQUAN) 150 MG capsule Take 150 mg by mouth at bedtime.    [provider]  doxycycline (VIBRAMYCIN) 100 MG capsule Take 1 capsule (100 mg total) by mouth 2 (two) times daily. Patient not taking: Reported on 10/06/2017 09/10/17   Rolland Porter, MD  EPINEPHrine (EPIPEN 2-PAK IJ) Inject as directed.    [provider]  gabapentin (NEURONTIN) 400 MG capsule Take 400 mg by mouth 2 (two) times daily.    [provider]  gabapentin (NEURONTIN) 800 MG tablet Take 800 mg by mouth at bedtime.     [provider]  haloperidol decanoate (HALDOL DECANOATE) 100 MG/ML injection Inject 150 mg into the muscle every 28 (twenty-eight) days.    [provider]  HYDROcodone-acetaminophen (NORCO) 10-325 MG per tablet Take 1 tablet by mouth every 6 (six) hours as needed for pain.     [provider]  ibuprofen (ADVIL,MOTRIN) 800 MG tablet  Take 1 tablet (800 mg total) 3 (three) times daily by mouth. 09/23/17   Mesner, Corene Cornea, MD  LINZESS [place] 1 CAPSULE BY MOUTH ONCE DAILY Patient not taking: Reported on 10/06/2017 06/08/13   Annitta Needs, NP  metoprolol (LOPRESSOR) 50 MG tablet Take 50 mg by mouth 2 (two) times daily.      [provider]  niacin (NIASPAN) 500 MG CR tablet Take 500 mg by mouth at bedtime.      [provider]  oxyCODONE (ROXICODONE) 15 MG immediate release tablet Take 15 mg by mouth 4 (four) times daily.    [provider]  polyethylene glycol powder (GLYCOLAX/MIRALAX) powder Take 17 g by mouth daily. Patient not taking: Reported on 10/06/2017 12/02/12   Annitta Needs, NP  predniSONE (DELTASONE) 20 MG tablet Take 3 po  QD x 3d , then 2 po QD x 3d then 1 po QD x 3d Patient not taking: Reported on 10/06/2017 09/10/17   Rolland Porter, MD  tiZANidine (ZANAFLEX) 4 MG capsule Take 4 mg by mouth 3 (three) times daily.    [provider]  traZODone (DESYREL) 150 MG tablet Take 300 mg by mouth at bedtime.     [provider]    Family History Family History  Problem Relation Age of Onset  . Anesthesia problems Mother   . Colon cancer Maternal Grandfather   . Crohn's disease Maternal Grandfather     Social History Social History   Tobacco Use  . Smoking status: Current Every Day Smoker    Packs/day: 1.00    Years: 22.00    Pack years: 22.00    Types: Cigarettes  . Smokeless tobacco: Never Used  Substance Use Topics  . Alcohol use: No  . Drug use: No     Allergies   Compazine; Imitrex [sumatriptan base]; Prochlorperazine edisylate; and Risperidone   Review of Systems Review of Systems  Constitutional: Negative for activity change, appetite change and fever.  HENT: Negative for congestion and rhinorrhea.   Respiratory: Negative for cough, chest tightness and shortness of breath.   Cardiovascular: Negative for chest pain.  Gastrointestinal: Negative  for abdominal pain, nausea and vomiting.  Genitourinary: Negative for dysuria, frequency and testicular pain.  Musculoskeletal: Positive for arthralgias and myalgias.  Skin: Negative for rash.  Neurological: Negative for dizziness, weakness and headaches.    all other systems are negative except as noted in the HPI and PMH.    Physical Exam Updated Vital Signs BP 122/65 (BP Location: Left Arm)   Pulse 81   Temp 98.4 F (36.9 C)   Ht 6\' 3"  (1.905 m)   Wt (!) 139.3 kg (307 lb)   SpO2 98%   BMI 38.37 kg/m   Physical Exam  Constitutional: He is oriented to person, place, and time. He appears well-developed and well-nourished. No distress.  HENT:  Head: Normocephalic and atraumatic.  Mouth/Throat: Oropharynx is clear and moist. No oropharyngeal exudate.  Eyes: Conjunctivae and EOM are normal. Pupils are equal, round, and reactive to light.  Neck: Normal range of motion. Neck supple.  No meningismus.  Cardiovascular: Normal rate, regular rhythm, normal heart sounds and intact distal pulses.  No murmur heard. Pulmonary/Chest: Effort normal and breath sounds normal. No respiratory distress. He exhibits no tenderness.  Abdominal: Soft. There is no tenderness. There is no rebound and no guarding.  Musculoskeletal: Normal range of motion. He exhibits edema and tenderness.  Tenderness to left anterior ankle with minimal edema and slight erythema.  There is no lateral or medial malleolar tenderness.  Intact DP and PT pulse.  Intact Achilles.  No calf asymmetry or swelling.  No calf tenderness  Neurological: He is alert and oriented to person, place, and time. No cranial nerve deficit. He exhibits normal muscle tone. Coordination normal.  No ataxia on finger to nose bilaterally. No pronator drift. 5/5 strength throughout. CN 2-12 intact.Equal grip strength. Sensation intact.   Skin: Skin is warm. Capillary refill takes less than 2 seconds. No rash noted.  Psychiatric: He has a normal mood  and affect. His behavior is normal.  Nursing note and vitals reviewed.    ED Treatments / Results  Labs (all labs ordered are listed, but only abnormal results are displayed) Labs Reviewed - No data to display  EKG know the dose of her morphine  EKG Interpretation None       Radiology No results found.  Procedures Procedures (including critical care time)  Medications Ordered in ED Medications  acetaminophen (TYLENOL) tablet 650 mg (not administered)     Initial Impression / Assessment and Plan / ED Course  I have reviewed the triage vital signs and the nursing notes.  Pertinent labs & imaging results that were available during my care of the patient were reviewed by me and considered in my medical decision making (see chart for details).    Atraumatic anterior left ankle pain.  Neurovascularly intact.  Afebrile.  There is very slight erythema.  There is no evidence of septic joint.  Doubt DVT.  There is no asymmetry or calf tenderness.  Patient may have early cellulitis.  Will obtain x-ray to evaluate joint.  Patient did not want to wait for x-ray was seen walking out of the ED. He was ambulatory without any assistance.  Final Clinical Impressions(s) / ED Diagnoses   Final diagnoses:  Left leg pain    ED Discharge Orders    None       Niguel Moure, Annie Main, MD 10/11/17 [phone]

## 2017-10-13 ENCOUNTER — Telehealth (HOSPITAL_COMMUNITY): Payer: Self-pay | Admitting: Physical Therapy

## 2017-10-13 ENCOUNTER — Ambulatory Visit (HOSPITAL_COMMUNITY): Payer: Medicare Other | Attending: Pulmonary Disease | Admitting: Physical Therapy

## 2017-10-13 NOTE — Telephone Encounter (Signed)
NS #2.  No answer or able to communicate with patient Teena Irani, PTA/CLT [phone]

## 2017-10-14 ENCOUNTER — Emergency Department (HOSPITAL_COMMUNITY)
Admission: EM | Admit: 2017-10-14 | Discharge: 2017-10-15 | Disposition: A | Discharge status: Home / Self Care | Payer: Medicare Other | Attending: Emergency Medicine | Admitting: Emergency Medicine

## 2017-10-14 ENCOUNTER — Other Ambulatory Visit: Payer: Self-pay

## 2017-10-14 ENCOUNTER — Encounter (HOSPITAL_COMMUNITY): Payer: Self-pay | Admitting: *Deleted

## 2017-10-14 ENCOUNTER — Other Ambulatory Visit: Payer: Self-pay | Admitting: Pulmonary Disease

## 2017-10-14 DIAGNOSIS — L03116 Cellulitis of left lower limb: Secondary | ICD-10-CM | POA: Diagnosis not present

## 2017-10-14 DIAGNOSIS — Z79899 Other long term (current) drug therapy: Secondary | ICD-10-CM | POA: Diagnosis not present

## 2017-10-14 DIAGNOSIS — I10 Essential (primary) hypertension: Secondary | ICD-10-CM | POA: Insufficient documentation

## 2017-10-14 DIAGNOSIS — F17210 Nicotine dependence, cigarettes, uncomplicated: Secondary | ICD-10-CM | POA: Diagnosis not present

## 2017-10-14 DIAGNOSIS — G8929 Other chronic pain: Secondary | ICD-10-CM

## 2017-10-14 DIAGNOSIS — J45909 Unspecified asthma, uncomplicated: Secondary | ICD-10-CM | POA: Insufficient documentation

## 2017-10-14 DIAGNOSIS — M545 Low back pain: Principal | ICD-10-CM

## 2017-10-14 DIAGNOSIS — R2242 Localized swelling, mass and lump, left lower limb: Secondary | ICD-10-CM | POA: Diagnosis present

## 2017-10-14 MED ORDER — PIPERACILLIN-TAZOBACTAM 3.375 G IVPB 30 MIN
3.3750 g | Freq: Once | INTRAVENOUS | Status: AC
  Administered 2017-10-15: 3.375 g via INTRAVENOUS
  Filled 2017-10-14: qty 50

## 2017-10-14 NOTE — ED Triage Notes (Signed)
Pt was seen here this past Saturday and was told he had cellulitis in his left lower leg. Pt states he feels like it is getting worse. Pt lle red, swollen, and warm to touch.

## 2017-10-14 NOTE — Patient Outreach (Signed)
[facility]) Care Management  10/14/2017  [name] 1976/08/24 [id number]   1st Telephone call to patient for ED Utilization screening. HIPAA verified. The patient stated that he was unable to talk at this time and would like for me to call him on another date.   Plan:  RN [facility] will make outreach attempt to the patient within three business days.  [name] RN, BSN, [place] Direct Dial:  [phone] Fax: [phone]

## 2017-10-15 ENCOUNTER — Ambulatory Visit (HOSPITAL_COMMUNITY): Payer: Medicare Other | Admitting: Physical Therapy

## 2017-10-15 DIAGNOSIS — L03116 Cellulitis of left lower limb: Secondary | ICD-10-CM | POA: Diagnosis not present

## 2017-10-15 LAB — CBC WITH DIFFERENTIAL/PLATELET
BASOS ABS: 0 10*3/uL (ref 0.0–0.1)
Basophils Relative: 0 %
EOS PCT: 2 %
Eosinophils Absolute: 0.2 10*3/uL (ref 0.0–0.7)
HCT: 41.8 % (ref 39.0–52.0)
Hemoglobin: 13.8 g/dL (ref 13.0–17.0)
LYMPHS ABS: 2.6 10*3/uL (ref 0.7–4.0)
LYMPHS PCT: 24 %
MCH: 29.6 pg (ref 26.0–34.0)
MCHC: 33 g/dL (ref 30.0–36.0)
MCV: 89.7 fL (ref 78.0–100.0)
Monocytes Absolute: 1 10*3/uL (ref 0.1–1.0)
Monocytes Relative: 9 %
NEUTROS ABS: 7.1 10*3/uL (ref 1.7–7.7)
NEUTROS PCT: 65 %
Platelets: 193 10*3/uL (ref 150–400)
RBC: 4.66 MIL/uL (ref 4.22–5.81)
RDW: 15.2 % (ref 11.5–15.5)
WBC: 10.9 10*3/uL — AB (ref 4.0–10.5)

## 2017-10-15 LAB — BASIC METABOLIC PANEL
Anion gap: 7 (ref 5–15)
BUN: 9 mg/dL (ref 6–20)
CHLORIDE: 111 mmol/L (ref 101–111)
CO2: 24 mmol/L (ref 22–32)
CREATININE: 0.58 mg/dL — AB (ref 0.61–1.24)
Calcium: 9 mg/dL (ref 8.9–10.3)
GFR calc Af Amer: >60 mL/min (ref >60)
GFR calc non Af Amer: >60 mL/min (ref >60)
GLUCOSE: 101 mg/dL — AB (ref 65–99)
Potassium: 3.4 mmol/L — ABNORMAL LOW (ref 3.5–5.1)
SODIUM: 142 mmol/L (ref 135–145)

## 2017-10-15 MED ORDER — CEPHALEXIN 500 MG PO CAPS: 500.0000 mg | ORAL_CAPSULE | Freq: Three times a day (TID) | ORAL | 0 refills | Status: DC

## 2017-10-15 MED ORDER — SULFAMETHOXAZOLE-TRIMETHOPRIM 800-160 MG PO TABS: 1.0000 | ORAL_TABLET | Freq: Two times a day (BID) | ORAL | 0 refills | Status: DC

## 2017-10-15 NOTE — Discharge Instructions (Signed)
Elevate your leg, use warm compresses for comfort.  Stop the doxycycline and start the Septra DS in the Keflex.  Recheck if you get a high fever or the redness starts spreading up your leg or you get fever and chills.

## 2017-10-15 NOTE — Telephone Encounter (Signed)
Called pt stats that he is not able to do therapy until he has an injection in his back next Thursday.  Rayetta Humphrey, [place] CLT [phone]

## 2017-10-15 NOTE — ED Provider Notes (Signed)
[facility] EMERGENCY DEPARTMENT Provider Note   CSN: [id number] Arrival date & time: 10/14/17  2048  Time seen 23:40 PM    History   Chief Complaint Chief Complaint  Patient presents with  . Leg Swelling    HPI [name] is a 41 y.o. male.  HPI patient states he started noticing some swelling and pain in his left lower extremity on December 1.  He was seen in the ED early in the morning for the same and left AMA.  He came back the same day and was felt to have cellulitis and was started on doxycycline after given hydromorphone 2 mg IM.  Patient states he has been taking the doxycycline twice a day for the past 4 days and states he is not any better.  He states on December 2 it started getting red.  He denies fever, chills, nausea, or vomiting.  He denies having this before.  Patient states he has been off the Suboxone since November.  Patient was seen for a right little toe fracture on November 13 and was refused pain medicine because he was on Suboxone.  I saw him again on November 23 for continued pain.  At that point he had a letter saying he had stopped the Suboxone.  He states he has already followed up with his primary care doctor for the toe fracture.  He states it is feeling better.  PCP Sinda Du, MD   Past Medical History:  Diagnosis Date  . Anxiety   . Asthma   . Bipolar 1 disorder ([place])   . Complication of anesthesia    Anxiety when he awaken with ET tube still in place  . GERD (gastroesophageal reflux disease)   . Hyperlipidemia   . Hypertension   . Hypoventilation syndrome   . Schizoaffective disorder ([place])    [place]    Patient Active Problem List   Diagnosis Date Noted  . Abdominal pain, epigastric 02/04/2013  . Constipation 12/02/2012  . Abdominal pain 11/22/2012  . INSOMNIA 04/26/2009  . BACK PAIN, LUMBAR 03/20/2009  . BIPOLAR AFFECTIVE DISORDER 02/17/2009  . OTHER ENTHESOPATHY OF ANKLE AND TARSUS 10/05/2008  . GERD 09/16/2008  .  PERSONAL HISTORY OF SCHIZOPHRENIA 05/27/2008  . ESOPHAGITIS, REFLUX 03/23/2008  . HIATAL HERNIA 03/23/2008  . HYPERLIPIDEMIA 01/07/2008  . MORBID OBESITY 01/07/2008  . ANXIETY DEPRESSION 01/07/2008  . TOBACCO ABUSE 01/07/2008  . HYPERTENSION 01/07/2008  . ALLERGIC RHINITIS 01/07/2008  . IBS 01/07/2008    Past Surgical History:  Procedure Laterality Date  . ANKLE SURGERY    . APPENDECTOMY    . CHOLECYSTECTOMY    . ESOPHAGOGASTRODUODENOSCOPY  03/17/2008   RMR: A tiny distal esophageal erosions consistent with mild  erosive reflux esophagitis, otherwise normal esophagus, small hiatal hernia, minimally polypoid antral mucosa with doubtful clinical was significance.  Otherwise, normal stomach, patent pylorus, and normal D1-D2  . ESOPHAGOGASTRODUODENOSCOPY (EGD) WITH PROPOFOL N/A 02/18/2013   Procedure: ESOPHAGOGASTRODUODENOSCOPY (EGD) WITH PROPOFOL;  Surgeon: Daneil Dolin, MD;  Location: [facility];  Service: Endoscopy;  Laterality: N/A;  . NASAL SINUS SURGERY  01/24/2012   Procedure: ENDOSCOPIC SINUS SURGERY;  Surgeon: Izora Gala, MD;  Location: [place];  Service: ENT;  Laterality: Left;  left ethmoidectomy, maxillary, frontal  . UMBILICAL HERNIA REPAIR         Home Medications    Prior to Admission medications   Medication Sig Start Date End Date Taking? Authorizing Provider  ALPRAZolam Duanne Moron) 1 MG tablet Take 1  mg by mouth 4 (four) times daily.    Yes [provider]  ARIPiprazole (ABILIFY) 20 MG tablet Take 20 mg by mouth daily.   Yes [provider]  citalopram (CELEXA) 40 MG tablet Take 40 mg by mouth every morning.    Yes [provider]  doxycycline (VIBRAMYCIN) 100 MG capsule Take 1 capsule (100 mg total) by mouth 2 (two) times daily. 10/11/17  Yes Fredia Sorrow, MD  EPINEPHrine (EPIPEN 2-PAK) 0.3 mg/0.3 mL IJ SOAJ injection Inject as directed.   Yes [provider]  HYDROcodone-acetaminophen (NORCO/VICODIN) 5-325 MG tablet Take 1-2 tablets  by mouth every 6 (six) hours as needed. 10/11/17  Yes Fredia Sorrow, MD  mirtazapine (REMERON) 30 MG tablet Take 30 mg by mouth at bedtime.   Yes [provider]  pantoprazole (PROTONIX) 40 MG tablet Take 40 mg by mouth daily.   Yes [provider]  traMADol (ULTRAM) 50 MG tablet Take 50 mg by mouth 4 (four) times daily.    Yes [provider]  albuterol (PROVENTIL HFA;VENTOLIN HFA) 108 (90 BASE) MCG/ACT inhaler Inhale 1-2 puffs into the lungs every 6 (six) hours as needed for wheezing. 04/21/12 04/21/13  Prentiss Bells, MD  benzonatate (TESSALON) 100 MG capsule Take 1 capsule (100 mg total) by mouth every 8 (eight) hours. Patient not taking: Reported on 10/14/2017 09/10/17   Rolland Porter, MD  cephALEXin (KEFLEX) 500 MG capsule Take 1 capsule (500 mg total) by mouth 3 (three) times daily. 10/15/17   Rolland Porter, MD  clindamycin (CLEOCIN) 150 MG capsule Take 1 capsule (150 mg total) by mouth every 6 (six) hours. Patient not taking: Reported on 10/06/2017 09/03/17   Lily Kocher, PA-C  dextromethorphan-guaiFENesin (ROBITUSSIN-DM) 10-100 MG/5ML liquid Take 10 mLs by mouth every 4 (four) hours as needed for cough. Patient not taking: Reported on 10/06/2017 09/03/17   Lily Kocher, PA-C  doxycycline (VIBRAMYCIN) 100 MG capsule Take 1 capsule (100 mg total) by mouth 2 (two) times daily. Patient not taking: Reported on 10/06/2017 09/10/17   Rolland Porter, MD  sulfamethoxazole-trimethoprim (BACTRIM DS,SEPTRA DS) 800-160 MG tablet Take 1 tablet by mouth 2 (two) times daily. 10/15/17   Rolland Porter, MD    Family History Family History  Problem Relation Age of Onset  . Anesthesia problems Mother   . Colon cancer Maternal Grandfather   . Crohn's disease Maternal Grandfather     Social History Social History   Tobacco Use  . Smoking status: Current Every Day Smoker    Packs/day: 1.00    Years: 22.00    Pack years: 22.00    Types: Cigarettes  . Smokeless tobacco: Never Used    Substance Use Topics  . Alcohol use: No  . Drug use: No  on disability for mental disorder  Allergies   Imitrex [sumatriptan base]; Risperidone; and Compazine   Review of Systems Review of Systems  All other systems reviewed and are negative.    Physical Exam Updated Vital Signs BP 130/79 (BP Location: Right Arm)   Pulse 89   Temp 97.9 F (36.6 C) (Oral)   Resp 16   SpO2 100%   Vital signs normal    Physical Exam  Constitutional: He appears well-developed and well-nourished. No distress.  HENT:  Head: Normocephalic and atraumatic.  Right Ear: External ear normal.  Left Ear: External ear normal.  Nose: Nose normal.  Eyes: Conjunctivae and EOM are normal.  Neck: Normal range of motion.  Cardiovascular: Normal rate.  Pulmonary/Chest: Effort  normal. No respiratory distress.  Musculoskeletal: He exhibits edema and tenderness.  Pt has mild redness of his left lower leg that is half way up his lower leg and stops at the ankle. There is mild warmth, no open lesions.   His right little toe is still swollen and buddy taped.   Skin: Skin is warm and dry. There is erythema.  Psychiatric: He has a normal mood and affect. His behavior is normal. Thought content normal.  Nursing note and vitals reviewed.        ED Treatments / Results  Labs (all labs ordered are listed, but only abnormal results are displayed) Results for orders placed or performed during the hospital encounter of 10/14/17  CBC with Differential  Result Value Ref Range   WBC 10.9 (H) 4.0 - 10.5 K/uL   RBC 4.66 4.22 - 5.81 MIL/uL   Hemoglobin 13.8 13.0 - 17.0 g/dL   HCT 41.8 39.0 - 52.0 %   MCV 89.7 78.0 - 100.0 fL   MCH 29.6 26.0 - 34.0 pg   MCHC 33.0 30.0 - 36.0 g/dL   RDW 15.2 11.5 - 15.5 %   Platelets 193 150 - 400 K/uL   Neutrophils Relative % 65 %   Neutro Abs 7.1 1.7 - 7.7 K/uL   Lymphocytes Relative 24 %   Lymphs Abs 2.6 0.7 - 4.0 K/uL   Monocytes Relative 9 %   Monocytes Absolute 1.0  0.1 - 1.0 K/uL   Eosinophils Relative 2 %   Eosinophils Absolute 0.2 0.0 - 0.7 K/uL   Basophils Relative 0 %   Basophils Absolute 0.0 0.0 - 0.1 K/uL  Basic metabolic panel  Result Value Ref Range   Sodium 142 135 - 145 mmol/L   Potassium 3.4 (L) 3.5 - 5.1 mmol/L   Chloride 111 101 - 111 mmol/L   CO2 24 22 - 32 mmol/L   Glucose, Bld 101 (H) 65 - 99 mg/dL   BUN 9 6 - 20 mg/dL   Creatinine, Ser 0.58 (L) 0.61 - 1.24 mg/dL   Calcium 9.0 8.9 - 10.3 mg/dL   GFR calc non Af Amer >60 >60 mL/min   GFR calc Af Amer >60 >60 mL/min   Anion gap 7 5 - 15    Laboratory interpretation all normal except mild hypokalemia, leukocytosis   EKG  EKG Interpretation None       Radiology No results found.  Procedures Procedures (including critical care time)  Medications Ordered in ED Medications  piperacillin-tazobactam (ZOSYN) IVPB 3.375 g (3.375 g Intravenous New Bag/Given 10/15/17 0011)     Initial Impression / Assessment and Plan / ED Course  I have reviewed the triage vital signs and the nursing notes.  Pertinent labs & imaging results that were available during my care of the patient were reviewed by me and considered in my medical decision making (see chart for details).      Patient was given Zosyn IV.  Laboratory testing was ordered.  At time of discharge patient is wanting to go home with "something stronger" and he wants to take his doxycycline.  Patient was discharged home, he was advised to stop the doxycycline and start the Septra DS with Keflex.  He should be rechecked if he gets a fever, or increasing redness.  On review of the [place] updated tonight, patient has multiple narcotic prescriptions over the past year including for #120 hydrocodone 7.5/325 and 10/325 mg tablets, in January, February, March, April, May, including one prescription  filled on July 21 and the other on June 25, and then 120 oxycodone 10 mg tablets in July, #120 oxycodone 5 mg  tablets in July, and #90 morphine sulfate 30 mg ER tablets in August, and then he was started on Suboxone in September and October and November.  He was started on #120 tramadol on November 14.  He also gets #120 alprazolam 1 mg tablets and #30 temazepam 30 mg tablets monthly.  His score is 860 out of a total of 1000 which puts him at high risk for overdose.  Patient has gotten 14 hydrocodone 5/325 filled on December 2 from our ED.  Final Clinical Impressions(s) / ED Diagnoses   Final diagnoses:  Cellulitis of left lower extremity    ED Discharge Orders        Ordered    sulfamethoxazole-trimethoprim (BACTRIM DS,SEPTRA DS) 800-160 MG tablet  2 times daily     10/15/17 0121    cephALEXin (KEFLEX) 500 MG capsule  3 times daily     10/15/17 0121      Plan discharge  Rolland Porter, MD, Barbette Or, MD 10/15/17 [phone]

## 2017-10-16 ENCOUNTER — Other Ambulatory Visit: Payer: Self-pay

## 2017-10-16 DIAGNOSIS — L03116 Cellulitis of left lower limb: Secondary | ICD-10-CM | POA: Diagnosis not present

## 2017-10-16 NOTE — Patient Outreach (Addendum)
[facility]) Care Management  10/16/2017  [name] 1975/11/23 [id number]   Telephone Screen Referral Date : 10/13/2017  Referral Source:[facility] ED Census Referral Reason: ED Utilization Insurance: [place] attempt # 2  To patient.  Social: The patient states that he lives with his fiance'. He is able to perform his ADLS/IADLS. He uses [facility] for transportation to his appointments.  Conditions: Per chart the patient has some anxiety, asthma, bipolar  1 disorder, GERD, and HTN.  Medications: The patient states that he has six medications. He manages his meds but has a hard time paying for his Abilify.  Appointments: The patient has an appointment today with his Primary Dr. Luan Pulling for cellulitis.  Consent/ Services: The patient verified HIPAA. Discussed and offered [facility] care management services. Patient verbally agreed to services.    Advance Directive: The patient states he does not have an advance directive but would like for me to mail him some information.    Plan: RN Health Coach will send the patient information on Advance directives. RN [facility] will make a referral to Pharmacy for medication assistance.   [name] RN, BSN, [place] Direct Dial:  [phone] Fax: [phone]

## 2017-10-17 ENCOUNTER — Ambulatory Visit (HOSPITAL_COMMUNITY): Payer: Medicare Other

## 2017-10-20 ENCOUNTER — Encounter (HOSPITAL_COMMUNITY): Payer: Self-pay | Admitting: Physical Therapy

## 2017-10-22 ENCOUNTER — Other Ambulatory Visit: Payer: Self-pay | Admitting: Pharmacist

## 2017-10-22 ENCOUNTER — Encounter (HOSPITAL_COMMUNITY): Payer: Self-pay | Admitting: Physical Therapy

## 2017-10-22 NOTE — Patient Outreach (Signed)
[facility]) Care Management  10/22/2017  JAYSIAH MARCHETTA August 28, 1976 [id number]   41 year old male referred to [facility] for ED utlization.  [facility] services requested for medication assistance with aiblify.  PMHx includes, but not limited to, bipolar disorder, anxiety, schizophrenia, tobacco abuse, HTN, HLD, GERD, hiatal hernia, IBS, and morbid obesity.  Noted 8 ED visits in the last 2 months for bronchitis, toe fracture, and cellulitis.    Per review of [facility], patient has [employer] and [place] Medicaid.   Per Lilli Light, Mr. Ozment filled a prescription for Abilify on 08/22/17, 30 day supply, and had $0 co-pay.  Prescription written by Matilde Haymaker, MD.    Unsuccessful telephone call to Mr. Mcintire today.  I left a HIPPA compliant voicemail on the home phone.  The secondary home phone number is not in service.    Plan: I will follow-up with Mr. Breshears regarding later this week regarding his Abilify unless I hear back from him beforehand.   Ralene Bathe, PharmD, [place] [phone]

## 2017-10-23 ENCOUNTER — Ambulatory Visit: Payer: Self-pay | Admitting: Pharmacist

## 2017-10-23 ENCOUNTER — Ambulatory Visit
Admission: RE | Admit: 2017-10-23 | Discharge: 2017-10-23 | Disposition: A | Discharge status: Home / Self Care | Payer: Medicare Other | Source: Ambulatory Visit | Attending: Pulmonary Disease | Admitting: Pulmonary Disease

## 2017-10-23 ENCOUNTER — Other Ambulatory Visit: Payer: Self-pay | Admitting: Pharmacist

## 2017-10-23 DIAGNOSIS — M545 Low back pain: Principal | ICD-10-CM

## 2017-10-23 DIAGNOSIS — M5126 Other intervertebral disc displacement, lumbar region: Secondary | ICD-10-CM | POA: Diagnosis not present

## 2017-10-23 DIAGNOSIS — G8929 Other chronic pain: Secondary | ICD-10-CM

## 2017-10-23 MED ORDER — IOPAMIDOL (ISOVUE-M 200) INJECTION 41%
1.0000 mL | Freq: Once | INTRAMUSCULAR | Status: AC
  Administered 2017-10-23: 1 mL via EPIDURAL

## 2017-10-23 MED ORDER — METHYLPREDNISOLONE ACETATE 40 MG/ML INJ SUSP (RADIOLOG
120.0000 mg | Freq: Once | INTRAMUSCULAR | Status: AC
  Administered 2017-10-23: 120 mg via EPIDURAL

## 2017-10-23 NOTE — Patient Outreach (Signed)
[facility] ([facility]) Care Management  10/23/2017  [name] 03/05/1976 [id number]  Successful outreach call to Mr. [name]. HIPAA identifiers verified. Mr. [name] reports [facility] is his primary pharmacy.  He reports he is changing from [employer] to [employer] Medicare Part D plan in 2019.  He reports he also has [place] Medicaid.  He thinks his co-pays range from $0 - $3.00.  He is not having any financial problems with his Abilify at this time (last filled for $0 copay).   He reports sometimes in the beginning of the year he has to pay higher co-pays until his deductible is met.  He is not sure what his co-pay will be in 2019 as this will be a new plan.  I explained that with medicaid, he should have the same co-pays.  Patient declined a medication review at this time. I provided my phone number in case patient needs to contact me in January with any questions or concerns at that time with his new plan.   Plan: [facility] pharmacy will sign-off patient case at this time.  I am happy to assist in the future as needed.  I will send a case closure letter to MD and alert [facility] CMA.   Thanks you for allowing [facility] pharmacy to be part of this patient's care.    , PharmD, BCPS Clinical Pharmacist [facility] [phone]      

## 2017-10-23 NOTE — Discharge Instructions (Signed)

## 2017-10-24 ENCOUNTER — Encounter (HOSPITAL_COMMUNITY): Payer: Self-pay

## 2017-11-01 ENCOUNTER — Other Ambulatory Visit: Payer: Self-pay

## 2017-11-01 ENCOUNTER — Emergency Department (HOSPITAL_COMMUNITY)
Admission: EM | Admit: 2017-11-01 | Discharge: 2017-11-01 | Disposition: A | Discharge status: Home / Self Care | Payer: Medicare Other | Attending: Emergency Medicine | Admitting: Emergency Medicine

## 2017-11-01 ENCOUNTER — Encounter (HOSPITAL_COMMUNITY): Payer: Self-pay | Admitting: Adult Health

## 2017-11-01 DIAGNOSIS — G8929 Other chronic pain: Secondary | ICD-10-CM | POA: Insufficient documentation

## 2017-11-01 DIAGNOSIS — M545 Low back pain, unspecified: Secondary | ICD-10-CM

## 2017-11-01 DIAGNOSIS — F17210 Nicotine dependence, cigarettes, uncomplicated: Secondary | ICD-10-CM | POA: Diagnosis not present

## 2017-11-01 DIAGNOSIS — Z79899 Other long term (current) drug therapy: Secondary | ICD-10-CM | POA: Insufficient documentation

## 2017-11-01 DIAGNOSIS — J45909 Unspecified asthma, uncomplicated: Secondary | ICD-10-CM | POA: Diagnosis not present

## 2017-11-01 DIAGNOSIS — I10 Essential (primary) hypertension: Secondary | ICD-10-CM | POA: Diagnosis not present

## 2017-11-01 MED ORDER — KETOROLAC TROMETHAMINE 30 MG/ML IJ SOLN
30.0000 mg | Freq: Once | INTRAMUSCULAR | Status: AC
  Administered 2017-11-01: 30 mg via INTRAMUSCULAR
  Filled 2017-11-01: qty 1

## 2017-11-01 NOTE — ED Provider Notes (Signed)
[facility] EMERGENCY DEPARTMENT Provider Note   CSN: [id number] Arrival date & time: 11/01/17  9528     History   Chief Complaint Chief Complaint  Patient presents with  . Back Pain    HPI [name] is a 41 y.o. male.  HPI   41 year old male presents today with complaints of chronic back pain.  Patient notes several years of lower back pain that is progressively worsened.  He is currently followed by his primary care who is been providing prescription of Ultram.  He notes he has been taking this at home without significant improvement in his symptoms.  He notes he has been referred to pain management, but has not had the referral call yet.  Patient notes that his pain worsened over the last 24 hours located bilateral lower lumbar.  He notes the addition of some numbness on the right upper thigh denies any distal neurological deficits, no loss of strength, range of motion.  Patient denies any bowel or bladder abnormalities, fever, or any other red flags.  Patient notes that the character of the pain is similar to previous pain.  Patient has had a recent MRI and has the results with him.  Past Medical History:  Diagnosis Date  . Anxiety   . Asthma   . Bipolar 1 disorder ([place])   . Complication of anesthesia    Anxiety when he awaken with ET tube still in place  . GERD (gastroesophageal reflux disease)   . Hyperlipidemia   . Hypertension   . Hypoventilation syndrome   . Schizoaffective disorder ([place])    [name]    Patient Active Problem List   Diagnosis Date Noted  . Abdominal pain, epigastric 02/04/2013  . Constipation 12/02/2012  . Abdominal pain 11/22/2012  . INSOMNIA 04/26/2009  . BACK PAIN, LUMBAR 03/20/2009  . BIPOLAR AFFECTIVE DISORDER 02/17/2009  . OTHER ENTHESOPATHY OF ANKLE AND TARSUS 10/05/2008  . GERD 09/16/2008  . PERSONAL HISTORY OF SCHIZOPHRENIA 05/27/2008  . ESOPHAGITIS, REFLUX 03/23/2008  . HIATAL HERNIA 03/23/2008  . HYPERLIPIDEMIA  01/07/2008  . MORBID OBESITY 01/07/2008  . ANXIETY DEPRESSION 01/07/2008  . TOBACCO ABUSE 01/07/2008  . HYPERTENSION 01/07/2008  . ALLERGIC RHINITIS 01/07/2008  . IBS 01/07/2008    Past Surgical History:  Procedure Laterality Date  . ANKLE SURGERY    . APPENDECTOMY    . CHOLECYSTECTOMY    . ESOPHAGOGASTRODUODENOSCOPY  03/17/2008   RMR: A tiny distal esophageal erosions consistent with mild  erosive reflux esophagitis, otherwise normal esophagus, small hiatal hernia, minimally polypoid antral mucosa with doubtful clinical was significance.  Otherwise, normal stomach, patent pylorus, and normal D1-D2  . ESOPHAGOGASTRODUODENOSCOPY (EGD) WITH PROPOFOL N/A 02/18/2013   Procedure: ESOPHAGOGASTRODUODENOSCOPY (EGD) WITH PROPOFOL;  Surgeon: Daneil Dolin, MD;  Location: [facility];  Service: Endoscopy;  Laterality: N/A;  . NASAL SINUS SURGERY  01/24/2012   Procedure: ENDOSCOPIC SINUS SURGERY;  Surgeon: Izora Gala, MD;  Location: [name];  Service: ENT;  Laterality: Left;  left ethmoidectomy, maxillary, frontal  . UMBILICAL HERNIA REPAIR         Home Medications    Prior to Admission medications   Medication Sig Start Date End Date Taking? Authorizing Provider  albuterol (PROVENTIL HFA;VENTOLIN HFA) 108 (90 BASE) MCG/ACT inhaler Inhale 1-2 puffs into the lungs every 6 (six) hours as needed for wheezing. 04/21/12 04/21/13  Prentiss Bells, MD  ALPRAZolam Duanne Moron) 1 MG tablet Take 1 mg by mouth 4 (four) times daily.  [provider]  ARIPiprazole (ABILIFY) 20 MG tablet Take 20 mg by mouth daily.    [provider]  benzonatate (TESSALON) 100 MG capsule Take 1 capsule (100 mg total) by mouth every 8 (eight) hours. Patient not taking: Reported on 10/14/2017 09/10/17   Rolland Porter, MD  cephALEXin (KEFLEX) 500 MG capsule Take 1 capsule (500 mg total) by mouth 3 (three) times daily. 10/15/17   Rolland Porter, MD  citalopram (CELEXA) 40 MG tablet Take 40 mg by mouth every morning.      [provider]  clindamycin (CLEOCIN) 150 MG capsule Take 1 capsule (150 mg total) by mouth every 6 (six) hours. Patient not taking: Reported on 10/06/2017 09/03/17   Lily Kocher, PA-C  dextromethorphan-guaiFENesin (ROBITUSSIN-DM) 10-100 MG/5ML liquid Take 10 mLs by mouth every 4 (four) hours as needed for cough. Patient not taking: Reported on 10/06/2017 09/03/17   Lily Kocher, PA-C  doxycycline (VIBRAMYCIN) 100 MG capsule Take 1 capsule (100 mg total) by mouth 2 (two) times daily. Patient not taking: Reported on 10/06/2017 09/10/17   Rolland Porter, MD  doxycycline (VIBRAMYCIN) 100 MG capsule Take 1 capsule (100 mg total) by mouth 2 (two) times daily. 10/11/17   Fredia Sorrow, MD  EPINEPHrine (EPIPEN 2-PAK) 0.3 mg/0.3 mL IJ SOAJ injection Inject as directed.    [provider]  HYDROcodone-acetaminophen (NORCO/VICODIN) 5-325 MG tablet Take 1-2 tablets by mouth every 6 (six) hours as needed. 10/11/17   Fredia Sorrow, MD  mirtazapine (REMERON) 30 MG tablet Take 30 mg by mouth at bedtime.    [provider]  pantoprazole (PROTONIX) 40 MG tablet Take 40 mg by mouth daily.    [provider]  sulfamethoxazole-trimethoprim (BACTRIM DS,SEPTRA DS) 800-160 MG tablet Take 1 tablet by mouth 2 (two) times daily. 10/15/17   Rolland Porter, MD  traMADol (ULTRAM) 50 MG tablet Take 50 mg by mouth 4 (four) times daily.     [provider]    Family History Family History  Problem Relation Age of Onset  . Anesthesia problems Mother   . Colon cancer Maternal Grandfather   . Crohn's disease Maternal Grandfather     Social History Social History   Tobacco Use  . Smoking status: Current Every Day Smoker    Packs/day: 1.00    Years: 22.00    Pack years: 22.00    Types: Cigarettes  . Smokeless tobacco: Never Used  Substance Use Topics  . Alcohol use: No  . Drug use: No     Allergies   Imitrex [sumatriptan base]; Risperidone; and Compazine   Review  of Systems Review of Systems  All other systems reviewed and are negative.    Physical Exam Updated Vital Signs BP (!) 149/85 (BP Location: Right Arm)   Pulse (!) 112   Temp 98.1 F (36.7 C) (Oral)   Resp 20   Ht 6\' 3"  (1.905 m)   Wt 135.2 kg (298 lb)   SpO2 96%   BMI 37.25 kg/m   Physical Exam  Constitutional: He is oriented to person, place, and time. He appears well-developed and well-nourished.  HENT:  Head: Normocephalic and atraumatic.  Eyes: Conjunctivae are normal. Pupils are equal, round, and reactive to light. Right eye exhibits no discharge. Left eye exhibits no discharge. No scleral icterus.  Neck: Normal range of motion. No JVD present. No tracheal deviation present.  Pulmonary/Chest: Effort normal. No stridor.  Abdominal: Soft. He exhibits no distension. There is no tenderness.  Musculoskeletal:  No C  or T-spine tenderness palpation.  Diffuse tenderness to palpation of the lumbar region, no redness swelling-straight leg raise left causes significant lower back pain -no radiation of symptoms left lower extremity full active range of motion  Neurological: He is alert and oriented to person, place, and time. Coordination normal.  Psychiatric: He has a normal mood and affect. His behavior is normal. Judgment and thought content normal.  Nursing note and vitals reviewed.    ED Treatments / Results  Labs (all labs ordered are listed, but only abnormal results are displayed) Labs Reviewed - No data to display  EKG  EKG Interpretation None       Radiology No results found.  Procedures Procedures (including critical care time)  Medications Ordered in ED Medications  ketorolac (TORADOL) 30 MG/ML injection 30 mg (30 mg Intramuscular Given 11/01/17 1038)     Initial Impression / Assessment and Plan / ED Course  I have reviewed the triage vital signs and the nursing notes.  Pertinent labs & imaging results that were available during my care of the  patient were reviewed by me and considered in my medical decision making (see chart for details).      Final Clinical Impressions(s) / ED Diagnoses   Final diagnoses:  Chronic bilateral low back pain without sciatica    41 year old male presents today with chronic back pain.  This appears characteristic of his previous evaluation.  Patient without any red flags, low suspicion for any acute changes.  Review of the drug database shows patient is at high risk for overdose given his score of 890 numerous narcotic prescriptions, amphetamines, and benzodiazepines.  Patient will be given a shot of Toradol here, he will be discharged with primary care follow-up, pain management follow-up and strict return precautions.  He verbalized understanding and agreement to today's plan had no further questions or concerns.  Patient slightly tachycardic at 112, afebrile I have low suspicion for infection, this is likely secondary to pain/ anxiety.   ED Discharge Orders    None       Francee Gentile 11/01/17 1217    Noemi Chapel, MD 11/02/17 [phone]

## 2017-11-01 NOTE — ED Triage Notes (Signed)
Presents with Chronic back pain that worsened last night in the lyumbar/sacral area. HE had an MRI on the 27th of November is has been referred to a pain doctor. He endorses worsening pain last night with radiation into the legs and increased ULtram to 4 times a day without relief.  Denies loss of B&B and numbness and tingling.

## 2017-11-01 NOTE — Discharge Instructions (Signed)
Please read attached information. If you experience any new or worsening signs or symptoms please return to the emergency room for evaluation. Please follow-up with your primary care provider or specialist as discussed. Please use medication prescribed only as directed and discontinue taking if you have any concerning signs or symptoms.   °

## 2017-11-03 NOTE — Addendum Note (Signed)
Addended byCalla Kicks T on: 11/03/2017 10:08 AM   Modules accepted: Level of Service, SmartSet

## 2017-11-03 NOTE — Progress Notes (Signed)
This encounter was created in error - please disregard.

## 2017-11-09 ENCOUNTER — Other Ambulatory Visit: Payer: Self-pay

## 2017-11-09 ENCOUNTER — Emergency Department (HOSPITAL_COMMUNITY)
Admission: EM | Admit: 2017-11-09 | Discharge: 2017-11-09 | Disposition: A | Discharge status: Home / Self Care | Payer: Medicare Other | Attending: Emergency Medicine | Admitting: Emergency Medicine

## 2017-11-09 ENCOUNTER — Encounter (HOSPITAL_COMMUNITY): Payer: Self-pay

## 2017-11-09 DIAGNOSIS — R519 Headache, unspecified: Secondary | ICD-10-CM

## 2017-11-09 DIAGNOSIS — I10 Essential (primary) hypertension: Secondary | ICD-10-CM | POA: Insufficient documentation

## 2017-11-09 DIAGNOSIS — F17210 Nicotine dependence, cigarettes, uncomplicated: Secondary | ICD-10-CM | POA: Diagnosis not present

## 2017-11-09 DIAGNOSIS — R51 Headache: Secondary | ICD-10-CM | POA: Diagnosis not present

## 2017-11-09 DIAGNOSIS — J45909 Unspecified asthma, uncomplicated: Secondary | ICD-10-CM | POA: Diagnosis not present

## 2017-11-09 DIAGNOSIS — Z79899 Other long term (current) drug therapy: Secondary | ICD-10-CM | POA: Diagnosis not present

## 2017-11-09 HISTORY — DX: Migraine, unspecified, not intractable, without status migrainosus: G43.909

## 2017-11-09 MED ORDER — KETOROLAC TROMETHAMINE 30 MG/ML IJ SOLN
30.0000 mg | Freq: Once | INTRAMUSCULAR | Status: AC
  Administered 2017-11-09: 30 mg via INTRAVENOUS
  Filled 2017-11-09: qty 1

## 2017-11-09 MED ORDER — METOCLOPRAMIDE HCL 5 MG/ML IJ SOLN
10.0000 mg | Freq: Once | INTRAMUSCULAR | Status: AC
  Administered 2017-11-09: 10 mg via INTRAVENOUS
  Filled 2017-11-09: qty 2

## 2017-11-09 MED ORDER — METHYLPREDNISOLONE SODIUM SUCC 125 MG IJ SOLR
125.0000 mg | Freq: Once | INTRAMUSCULAR | Status: AC
  Administered 2017-11-09: 125 mg via INTRAVENOUS
  Filled 2017-11-09: qty 2

## 2017-11-09 MED ORDER — SODIUM CHLORIDE 0.9 % IV BOLUS (SEPSIS)
1000.0000 mL | Freq: Once | INTRAVENOUS | Status: AC
  Administered 2017-11-09: 1000 mL via INTRAVENOUS

## 2017-11-09 NOTE — ED Provider Notes (Signed)
[facility] EMERGENCY DEPARTMENT Provider Note   CSN: [id number] Arrival date & time: 11/09/17  1033     History   Chief Complaint Chief Complaint  Patient presents with  . Headache  . Emesis    HPI LONGINO TREFZ is a 41 y.o. male.  HPI Patient resents with gradual onset left-sided frontal headache starting yesterday.  States he has photophobia in his left eye and nausea.  Has had one episode of vomiting.  No fever or chills.  No neck pain or stiffness.  No focal weakness or numbness.  Patient denies nasal congestion or sinus pressure.  States he has had migraines in the past and this feels similar.  Has taken Fioricet, tramadol and Phenergan with no relief. Past Medical History:  Diagnosis Date  . Anxiety   . Asthma   . Bipolar 1 disorder ([place])   . Complication of anesthesia    Anxiety when he awaken with ET tube still in place  . GERD (gastroesophageal reflux disease)   . Hyperlipidemia   . Hypertension   . Hypoventilation syndrome   . Migraines   . Schizoaffective disorder ([place])    [place]    Patient Active Problem List   Diagnosis Date Noted  . Abdominal pain, epigastric 02/04/2013  . Constipation 12/02/2012  . Abdominal pain 11/22/2012  . INSOMNIA 04/26/2009  . BACK PAIN, LUMBAR 03/20/2009  . BIPOLAR AFFECTIVE DISORDER 02/17/2009  . OTHER ENTHESOPATHY OF ANKLE AND TARSUS 10/05/2008  . GERD 09/16/2008  . PERSONAL HISTORY OF SCHIZOPHRENIA 05/27/2008  . ESOPHAGITIS, REFLUX 03/23/2008  . HIATAL HERNIA 03/23/2008  . HYPERLIPIDEMIA 01/07/2008  . MORBID OBESITY 01/07/2008  . ANXIETY DEPRESSION 01/07/2008  . TOBACCO ABUSE 01/07/2008  . HYPERTENSION 01/07/2008  . ALLERGIC RHINITIS 01/07/2008  . IBS 01/07/2008    Past Surgical History:  Procedure Laterality Date  . ANKLE SURGERY    . APPENDECTOMY    . CHOLECYSTECTOMY    . ESOPHAGOGASTRODUODENOSCOPY  03/17/2008   RMR: A tiny distal esophageal erosions consistent with mild  erosive reflux  esophagitis, otherwise normal esophagus, small hiatal hernia, minimally polypoid antral mucosa with doubtful clinical was significance.  Otherwise, normal stomach, patent pylorus, and normal D1-D2  . ESOPHAGOGASTRODUODENOSCOPY (EGD) WITH PROPOFOL N/A 02/18/2013   Procedure: ESOPHAGOGASTRODUODENOSCOPY (EGD) WITH PROPOFOL;  Surgeon: Daneil Dolin, MD;  Location: [facility];  Service: Endoscopy;  Laterality: N/A;  . NASAL SINUS SURGERY  01/24/2012   Procedure: ENDOSCOPIC SINUS SURGERY;  Surgeon: Izora Gala, MD;  Location: [place];  Service: ENT;  Laterality: Left;  left ethmoidectomy, maxillary, frontal  . UMBILICAL HERNIA REPAIR         Home Medications    Prior to Admission medications   Medication Sig Start Date End Date Taking? Authorizing Provider  albuterol (PROVENTIL HFA;VENTOLIN HFA) 108 (90 BASE) MCG/ACT inhaler Inhale 1-2 puffs into the lungs every 6 (six) hours as needed for wheezing. 04/21/12 11/09/18 Yes Prentiss Bells, MD  ALPRAZolam Duanne Moron) 1 MG tablet Take 1 mg by mouth 4 (four) times daily.    Yes [provider]  ARIPiprazole (ABILIFY) 20 MG tablet Take 20 mg by mouth at bedtime.    Yes [provider]  baclofen (LIORESAL) 20 MG tablet Take 20 mg by mouth 3 (three) times daily. 11/02/17  Yes [provider]  citalopram (CELEXA) 40 MG tablet Take 40 mg by mouth every morning.    Yes [provider]  EPINEPHrine (EPIPEN 2-PAK) 0.3 mg/0.3 mL IJ SOAJ injection Inject as  directed.   Yes [provider]  methylphenidate (RITALIN) 20 MG tablet Take 20 mg by mouth daily. 10/28/17  Yes [provider]  pantoprazole (PROTONIX) 40 MG tablet Take 40 mg by mouth at bedtime.    Yes [provider]  traMADol (ULTRAM) 50 MG tablet Take 50 mg by mouth 4 (four) times daily.    Yes [provider]  HYDROcodone-acetaminophen (NORCO/VICODIN) 5-325 MG tablet Take 1-2 tablets by mouth every 6 (six) hours as needed. Patient not taking:  Reported on 11/09/2017 10/11/17   Fredia Sorrow, MD    Family History Family History  Problem Relation Age of Onset  . Anesthesia problems Mother   . Colon cancer Maternal Grandfather   . Crohn's disease Maternal Grandfather     Social History Social History   Tobacco Use  . Smoking status: Current Every Day Smoker    Packs/day: 1.00    Years: 22.00    Pack years: 22.00    Types: Cigarettes  . Smokeless tobacco: Never Used  Substance Use Topics  . Alcohol use: No  . Drug use: No     Allergies   Imitrex [sumatriptan base]; Risperidone; and Compazine   Review of Systems Review of Systems  Constitutional: Negative for chills and fever.  HENT: Negative for congestion, sinus pressure, sinus pain and sore throat.   Eyes: Positive for photophobia. Negative for visual disturbance.  Respiratory: Negative for shortness of breath.   Cardiovascular: Negative for chest pain.  Gastrointestinal: Positive for nausea and vomiting. Negative for abdominal pain.  Musculoskeletal: Negative for back pain, myalgias, neck pain and neck stiffness.  Skin: Negative for rash and wound.  Neurological: Positive for headaches. Negative for dizziness, weakness, light-headedness and numbness.  All other systems reviewed and are negative.    Physical Exam Updated Vital Signs BP (!) 144/84 (BP Location: Right Arm)   Pulse 84 Comment: BP 123/97  Temp 98.3 F (36.8 C) (Oral)   Resp 15   Ht 6\' 3"  (1.905 m)   Wt 135.2 kg (298 lb)   SpO2 97% Comment: BP 123/97  BMI 37.25 kg/m   Physical Exam  Constitutional: He is oriented to person, place, and time. He appears well-developed and well-nourished. No distress.  HENT:  Head: Normocephalic and atraumatic.  Mouth/Throat: Oropharynx is clear and moist.  Bilateral nasal mucosal edema.  No definite tenderness to percussion over the frontal and maxillary sinuses.  Eyes: EOM are normal. Pupils are equal, round, and reactive to light.  Neck: Normal  range of motion. Neck supple.  No meningismus  Cardiovascular: Normal rate and regular rhythm. Exam reveals no gallop and no friction rub.  No murmur heard. Pulmonary/Chest: Effort normal and breath sounds normal. No stridor. No respiratory distress. He has no wheezes. He has no rales. He exhibits no tenderness.  Abdominal: Soft. Bowel sounds are normal. There is no tenderness. There is no rebound and no guarding.  Musculoskeletal: Normal range of motion. He exhibits no edema or tenderness.  Neurological: He is alert and oriented to person, place, and time.  Moving all extremities without focal deficit.  Sensation fully intact.  Skin: Skin is warm and dry. Capillary refill takes less than 2 seconds. No rash noted. No erythema.  Psychiatric: He has a normal mood and affect. His behavior is normal.  Nursing note and vitals reviewed.    ED Treatments / Results  Labs (all labs ordered are listed, but only abnormal results are displayed) Labs Reviewed - No data to display  EKG  EKG Interpretation None       Radiology No results found.  Procedures Procedures (including critical care time)  Medications Ordered in ED Medications  ketorolac (TORADOL) 30 MG/ML injection 30 mg (30 mg Intravenous Given 11/09/17 1408)  sodium chloride 0.9 % bolus 1,000 mL (1,000 mLs Intravenous New Bag/Given 11/09/17 1411)  metoCLOPramide (REGLAN) injection 10 mg (10 mg Intravenous Given 11/09/17 1410)  methylPREDNISolone sodium succinate (SOLU-MEDROL) 125 mg/2 mL injection 125 mg (125 mg Intravenous Given 11/09/17 1408)     Initial Impression / Assessment and Plan / ED Course  I have reviewed the triage vital signs and the nursing notes.  Pertinent labs & imaging results that were available during my care of the patient were reviewed by me and considered in my medical decision making (see chart for details).     Patient is well-appearing.  No reflux signs or symptoms.  Will treat  symptomatically. Headache has resolved.  Requesting discharge home.  Return precautions given. Final Clinical Impressions(s) / ED Diagnoses   Final diagnoses:  Nonintractable headache, unspecified chronicity pattern, unspecified headache type    ED Discharge Orders    None       Julianne Rice, MD 11/09/17 1504

## 2017-11-09 NOTE — ED Triage Notes (Addendum)
Patient reports of gradual headache that started yesterday with vomiting. Reports of photosensitivtiy. Has taken Tramadol, phenergen, Fioricet with no relief. Hx of migraines.

## 2017-11-10 DIAGNOSIS — R0602 Shortness of breath: Secondary | ICD-10-CM | POA: Diagnosis not present

## 2017-11-10 DIAGNOSIS — J441 Chronic obstructive pulmonary disease with (acute) exacerbation: Secondary | ICD-10-CM | POA: Diagnosis not present

## 2017-11-12 DIAGNOSIS — M545 Low back pain: Secondary | ICD-10-CM | POA: Diagnosis not present

## 2017-11-12 DIAGNOSIS — I10 Essential (primary) hypertension: Secondary | ICD-10-CM | POA: Diagnosis not present

## 2017-11-12 DIAGNOSIS — R945 Abnormal results of liver function studies: Secondary | ICD-10-CM | POA: Diagnosis not present

## 2017-11-12 DIAGNOSIS — F209 Schizophrenia, unspecified: Secondary | ICD-10-CM | POA: Diagnosis not present

## 2017-11-14 ENCOUNTER — Other Ambulatory Visit: Payer: Self-pay | Admitting: Pulmonary Disease

## 2017-11-14 DIAGNOSIS — M545 Low back pain: Principal | ICD-10-CM

## 2017-11-14 DIAGNOSIS — G8929 Other chronic pain: Secondary | ICD-10-CM

## 2017-11-26 ENCOUNTER — Emergency Department (HOSPITAL_COMMUNITY): Payer: Medicare HMO

## 2017-11-26 ENCOUNTER — Emergency Department (HOSPITAL_COMMUNITY)
Admission: EM | Admit: 2017-11-26 | Discharge: 2017-11-26 | Disposition: A | Discharge status: Home / Self Care | Payer: Medicare HMO | Attending: Emergency Medicine | Admitting: Emergency Medicine

## 2017-11-26 ENCOUNTER — Ambulatory Visit
Admission: RE | Admit: 2017-11-26 | Discharge: 2017-11-26 | Disposition: A | Discharge status: Home / Self Care | Payer: Medicare HMO | Source: Ambulatory Visit | Attending: Pulmonary Disease | Admitting: Pulmonary Disease

## 2017-11-26 ENCOUNTER — Other Ambulatory Visit: Payer: Self-pay

## 2017-11-26 ENCOUNTER — Encounter (HOSPITAL_COMMUNITY): Payer: Self-pay | Admitting: Emergency Medicine

## 2017-11-26 DIAGNOSIS — N503 Cyst of epididymis: Secondary | ICD-10-CM | POA: Diagnosis not present

## 2017-11-26 DIAGNOSIS — N452 Orchitis: Secondary | ICD-10-CM | POA: Insufficient documentation

## 2017-11-26 DIAGNOSIS — G8929 Other chronic pain: Secondary | ICD-10-CM

## 2017-11-26 DIAGNOSIS — I10 Essential (primary) hypertension: Secondary | ICD-10-CM | POA: Diagnosis not present

## 2017-11-26 DIAGNOSIS — M47817 Spondylosis without myelopathy or radiculopathy, lumbosacral region: Secondary | ICD-10-CM | POA: Diagnosis not present

## 2017-11-26 DIAGNOSIS — M545 Low back pain: Principal | ICD-10-CM

## 2017-11-26 DIAGNOSIS — N50811 Right testicular pain: Secondary | ICD-10-CM | POA: Diagnosis not present

## 2017-11-26 DIAGNOSIS — N50819 Testicular pain, unspecified: Secondary | ICD-10-CM

## 2017-11-26 DIAGNOSIS — Z79899 Other long term (current) drug therapy: Secondary | ICD-10-CM | POA: Insufficient documentation

## 2017-11-26 DIAGNOSIS — F17210 Nicotine dependence, cigarettes, uncomplicated: Secondary | ICD-10-CM | POA: Diagnosis not present

## 2017-11-26 DIAGNOSIS — J45909 Unspecified asthma, uncomplicated: Secondary | ICD-10-CM | POA: Insufficient documentation

## 2017-11-26 DIAGNOSIS — R1030 Lower abdominal pain, unspecified: Secondary | ICD-10-CM | POA: Diagnosis present

## 2017-11-26 HISTORY — DX: Dorsalgia, unspecified: M54.9

## 2017-11-26 HISTORY — DX: Radiculopathy, lumbar region: M54.16

## 2017-11-26 HISTORY — DX: Torsion of testis, unspecified: N44.00

## 2017-11-26 HISTORY — DX: Other chronic pain: G89.29

## 2017-11-26 LAB — URINALYSIS, ROUTINE W REFLEX MICROSCOPIC
Bilirubin Urine: NEGATIVE
GLUCOSE, UA: NEGATIVE mg/dL
HGB URINE DIPSTICK: NEGATIVE
KETONES UR: NEGATIVE mg/dL
Leukocytes, UA: NEGATIVE
Nitrite: NEGATIVE
PROTEIN: NEGATIVE mg/dL
Specific Gravity, Urine: 1.015 (ref 1.005–1.030)
pH: 7 (ref 5.0–8.0)

## 2017-11-26 LAB — CBC WITH DIFFERENTIAL/PLATELET
Basophils Absolute: 0 10*3/uL (ref 0.0–0.1)
Basophils Relative: 0 %
EOS PCT: 2 %
Eosinophils Absolute: 0.2 10*3/uL (ref 0.0–0.7)
HCT: 45.1 % (ref 39.0–52.0)
Hemoglobin: 15.1 g/dL (ref 13.0–17.0)
LYMPHS ABS: 2 10*3/uL (ref 0.7–4.0)
LYMPHS PCT: 24 %
MCH: 30 pg (ref 26.0–34.0)
MCHC: 33.5 g/dL (ref 30.0–36.0)
MCV: 89.5 fL (ref 78.0–100.0)
Monocytes Absolute: 0.6 10*3/uL (ref 0.1–1.0)
Monocytes Relative: 7 %
Neutro Abs: 5.5 10*3/uL (ref 1.7–7.7)
Neutrophils Relative %: 67 %
PLATELETS: 176 10*3/uL (ref 150–400)
RBC: 5.04 MIL/uL (ref 4.22–5.81)
RDW: 14.6 % (ref 11.5–15.5)
WBC: 8.3 10*3/uL (ref 4.0–10.5)

## 2017-11-26 LAB — BASIC METABOLIC PANEL
Anion gap: 9 (ref 5–15)
BUN: 8 mg/dL (ref 6–20)
CHLORIDE: 105 mmol/L (ref 101–111)
CO2: 25 mmol/L (ref 22–32)
Calcium: 9 mg/dL (ref 8.9–10.3)
Creatinine, Ser: 0.83 mg/dL (ref 0.61–1.24)
GFR calc Af Amer: >60 mL/min (ref >60)
GLUCOSE: 97 mg/dL (ref 65–99)
POTASSIUM: 4 mmol/L (ref 3.5–5.1)
Sodium: 139 mmol/L (ref 135–145)

## 2017-11-26 MED ORDER — KETOROLAC TROMETHAMINE 30 MG/ML IJ SOLN
30.0000 mg | Freq: Once | INTRAMUSCULAR | Status: AC
  Administered 2017-11-26: 30 mg via INTRAVENOUS
  Filled 2017-11-26: qty 1

## 2017-11-26 MED ORDER — FENTANYL CITRATE (PF) 100 MCG/2ML IJ SOLN
50.0000 ug | INTRAMUSCULAR | Status: DC | PRN
  Administered 2017-11-26: 50 ug via INTRAVENOUS

## 2017-11-26 MED ORDER — IOPAMIDOL (ISOVUE-M 200) INJECTION 41%
1.0000 mL | Freq: Once | INTRAMUSCULAR | Status: AC
  Administered 2017-11-26: 1 mL via EPIDURAL

## 2017-11-26 MED ORDER — NAPROXEN 250 MG PO TABS: 250.0000 mg | ORAL_TABLET | Freq: Two times a day (BID) | ORAL | 0 refills | Status: DC | PRN

## 2017-11-26 MED ORDER — DOXYCYCLINE HYCLATE 100 MG PO TABS: 100.0000 mg | ORAL_TABLET | Freq: Once | ORAL | Status: DC

## 2017-11-26 MED ORDER — DOXYCYCLINE HYCLATE 100 MG PO TABS: 100.0000 mg | ORAL_TABLET | Freq: Two times a day (BID) | ORAL | 0 refills | Status: DC

## 2017-11-26 MED ORDER — ONDANSETRON HCL 4 MG/2ML IJ SOLN
4.0000 mg | INTRAMUSCULAR | Status: DC | PRN
  Administered 2017-11-26: 4 mg via INTRAVENOUS
  Filled 2017-11-26: qty 2

## 2017-11-26 MED ORDER — CEFTRIAXONE SODIUM 250 MG IJ SOLR: 250.0000 mg | Freq: Once | INTRAMUSCULAR | Status: DC

## 2017-11-26 MED ORDER — FENTANYL CITRATE (PF) 100 MCG/2ML IJ SOLN
50.0000 ug | INTRAMUSCULAR | Status: AC | PRN
  Administered 2017-11-26 (×2): 50 ug via INTRAVENOUS
  Filled 2017-11-26: qty 2

## 2017-11-26 MED ORDER — METHYLPREDNISOLONE ACETATE 40 MG/ML INJ SUSP (RADIOLOG
120.0000 mg | Freq: Once | INTRAMUSCULAR | Status: AC
  Administered 2017-11-26: 120 mg via EPIDURAL

## 2017-11-26 NOTE — ED Notes (Signed)
Pt left without dc papers. Pt called and I left a message

## 2017-11-26 NOTE — Discharge Instructions (Addendum)
Take the prescriptions as directed.  Wear supportive underwear for comfort. Call your regular medical doctor and the Urologist today to schedule a follow up appointment within the next 3 days.  Return to the Emergency Department immediately sooner if worsening.

## 2017-11-26 NOTE — ED Notes (Signed)
Patient requesting to speak to Dr. Thurnell Garbe. Dr. Durward Fortes.

## 2017-11-26 NOTE — ED Triage Notes (Signed)
Patient reports severe testicular pain and edema on the right, started this am.

## 2017-11-26 NOTE — ED Provider Notes (Signed)
[facility] EMERGENCY DEPARTMENT Provider Note   CSN: [id number] Arrival date & time: 11/26/17  1209     History   Chief Complaint Chief Complaint  Patient presents with  . Groin Pain    HPI [name] is a 42 y.o. male.  HPI  Pt was seen at 1335. Per pt, c/o sudden onset and persistence of constant right testicular "pain" that woke him up from sleep at 0800 this morning. Pt describes the pain as "sharp" and "squeezing." Pt is concerned "that it's another torsion or a hernia" (has hx of left testicular torsion "20 years ago"). Denies rash, no fevers, no injury, no abd pain, no N/V/D, no dysuria/hematuria, no penile drainage.   Past Medical History:  Diagnosis Date  . Anxiety   . Asthma   . Bipolar 1 disorder ([place])   . Chronic back pain   . Complication of anesthesia    Anxiety when he awaken with ET tube still in place  . GERD (gastroesophageal reflux disease)   . Hyperlipidemia   . Hypertension   . Hypoventilation syndrome   . Left testicular torsion    "20 years ago"  . Lumbar radiculopathy   . Migraines   . Schizoaffective disorder ([place])    [place]    Patient Active Problem List   Diagnosis Date Noted  . Schizophrenia ([place]) 05/28/2016  . Abdominal pain, epigastric 02/04/2013  . Constipation 12/02/2012  . Abdominal pain 11/22/2012  . INSOMNIA 04/26/2009  . BACK PAIN, LUMBAR 03/20/2009  . BIPOLAR AFFECTIVE DISORDER 02/17/2009  . OTHER ENTHESOPATHY OF ANKLE AND TARSUS 10/05/2008  . GERD 09/16/2008  . PERSONAL HISTORY OF SCHIZOPHRENIA 05/27/2008  . ESOPHAGITIS, REFLUX 03/23/2008  . HIATAL HERNIA 03/23/2008  . HYPERLIPIDEMIA 01/07/2008  . MORBID OBESITY 01/07/2008  . ANXIETY DEPRESSION 01/07/2008  . TOBACCO ABUSE 01/07/2008  . HYPERTENSION 01/07/2008  . ALLERGIC RHINITIS 01/07/2008  . IBS 01/07/2008    Past Surgical History:  Procedure Laterality Date  . ANKLE SURGERY    . APPENDECTOMY    . CHOLECYSTECTOMY    . ESOPHAGOGASTRODUODENOSCOPY   03/17/2008   RMR: A tiny distal esophageal erosions consistent with mild  erosive reflux esophagitis, otherwise normal esophagus, small hiatal hernia, minimally polypoid antral mucosa with doubtful clinical was significance.  Otherwise, normal stomach, patent pylorus, and normal D1-D2  . ESOPHAGOGASTRODUODENOSCOPY (EGD) WITH PROPOFOL N/A 02/18/2013   Procedure: ESOPHAGOGASTRODUODENOSCOPY (EGD) WITH PROPOFOL;  Surgeon: Daneil Dolin, MD;  Location: [facility];  Service: Endoscopy;  Laterality: N/A;  . NASAL SINUS SURGERY  01/24/2012   Procedure: ENDOSCOPIC SINUS SURGERY;  Surgeon: Izora Gala, MD;  Location: [place];  Service: ENT;  Laterality: Left;  left ethmoidectomy, maxillary, frontal  . UMBILICAL HERNIA REPAIR         Home Medications    Prior to Admission medications   Medication Sig Start Date End Date Taking? Authorizing Provider  albuterol (PROVENTIL HFA;VENTOLIN HFA) 108 (90 BASE) MCG/ACT inhaler Inhale 1-2 puffs into the lungs every 6 (six) hours as needed for wheezing. 04/21/12 11/09/18 Yes Prentiss Bells, MD  ALPRAZolam Duanne Moron) 1 MG tablet Take 1 mg by mouth 4 (four) times daily.    Yes [provider]  ARIPiprazole (ABILIFY) 20 MG tablet Take 20 mg by mouth at bedtime.    Yes [provider]  baclofen (LIORESAL) 20 MG tablet Take 20 mg by mouth 3 (three) times daily. 11/02/17  Yes [provider]  citalopram (CELEXA) 40 MG tablet Take 40 mg by  mouth every morning.    Yes [provider]  EPINEPHrine (EPIPEN 2-PAK) 0.3 mg/0.3 mL IJ SOAJ injection Inject as directed.   Yes [provider]  methylphenidate (RITALIN) 20 MG tablet Take 20 mg by mouth daily. 10/28/17  Yes [provider]  pantoprazole (PROTONIX) 40 MG tablet Take 40 mg by mouth at bedtime.    Yes [provider]  traMADol (ULTRAM) 50 MG tablet Take 50 mg by mouth 4 (four) times daily.    Yes [provider]  HYDROcodone-acetaminophen (NORCO/VICODIN)  5-325 MG tablet Take 1-2 tablets by mouth every 6 (six) hours as needed. Patient not taking: Reported on 11/09/2017 10/11/17   Charles Sorrow, MD    Family History Family History  Problem Relation Age of Onset  . Anesthesia problems Mother   . Colon cancer Maternal Grandfather   . Crohn's disease Maternal Grandfather     Social History Social History   Tobacco Use  . Smoking status: Current Every Day Smoker    Packs/day: 1.00    Years: 22.00    Pack years: 22.00    Types: Cigarettes  . Smokeless tobacco: Never Used  Substance Use Topics  . Alcohol use: No  . Drug use: No     Allergies   Compazine; Imitrex [sumatriptan base]; and Risperidone   Review of Systems Review of Systems ROS: Statement: All systems negative except as marked or noted in the HPI; Constitutional: Negative for fever and chills. ; ; Eyes: Negative for eye pain, redness and discharge. ; ; ENMT: Negative for ear pain, hoarseness, nasal congestion, sinus pressure and sore throat. ; ; Cardiovascular: Negative for chest pain, palpitations, diaphoresis, dyspnea and peripheral edema. ; ; Respiratory: Negative for cough, wheezing and stridor. ; ; Gastrointestinal: Negative for nausea, vomiting, diarrhea, abdominal pain, blood in stool, hematemesis, jaundice and rectal bleeding. . ; ; Genitourinary: Negative for dysuria, flank pain and hematuria. ; ; Genital:  No penile drainage or rash, +right testicular pain, no scrotal rash or swelling. ;; Musculoskeletal: Negative for back pain and neck pain. Negative for swelling and trauma.; ; Skin: Negative for pruritus, rash, abrasions, blisters, bruising and skin lesion.; ; Neuro: Negative for headache, lightheadedness and neck stiffness. Negative for weakness, altered level of consciousness, altered mental status, extremity weakness, paresthesias, involuntary movement, seizure and syncope.       Physical Exam Updated Vital Signs BP 135/88 (BP Location: Right Arm)   Pulse  94   Temp 98.4 F (36.9 C) (Oral)   Resp 20   Ht 6\' 3"  (1.905 m)   Wt 133.8 kg (295 lb)   SpO2 96%   BMI 36.87 kg/m   Physical Exam 1340: Physical examination:  Nursing notes reviewed; Vital signs and O2 SAT reviewed;  Constitutional: Well developed, Well nourished, Well hydrated, Uncomfortable appearing.; Head:  Normocephalic, atraumatic; Eyes: EOMI, PERRL, No scleral icterus; ENMT: Mouth and pharynx normal, Mucous membranes moist; Neck: Supple, Full range of motion, No lymphadenopathy; Cardiovascular: Regular rate and rhythm, No gallop; Respiratory: Breath sounds clear & equal bilaterally, No wheezes.  Speaking full sentences with ease, Normal respiratory effort/excursion; Chest: Nontender, Movement normal; Abdomen: Soft, Nontender, Nondistended, Normal bowel sounds; Genitourinary: No CVA tenderness. Genital and rectal exam performed with pt permission and ED RN chaperone present during exam.  Pt examined laying.  No perineal erythema.  No penile lesions or drainage.  No scrotal erythema, edema or tenderness to palp.  Normal testicular lie.  +generalized right testicular tenderness to palp. +cremasteric reflexes bilat.  No inguinal LAN or palpable masses..; Extremities: Pulses normal, No tenderness, No edema, No calf edema or asymmetry.; Neuro: AA&Ox3, Major CN grossly intact.  Speech clear. No gross focal motor or sensory deficits in extremities.; Skin: Color normal, Warm, Dry.   ED Treatments / Results  Labs (all labs ordered are listed, but only abnormal results are displayed)   EKG  EKG Interpretation None       Radiology   Procedures Procedures (including critical care time)  Medications Ordered in ED Medications  fentaNYL (SUBLIMAZE) injection 50 mcg (50 mcg Intravenous Given 11/26/17 1350)  ondansetron (ZOFRAN) injection 4 mg (4 mg Intravenous Given 11/26/17 1350)     Initial Impression / Assessment and Plan / ED Course  I have reviewed the triage vital signs and the  nursing notes.  Pertinent labs & imaging results that were available during my care of the patient were reviewed by me and considered in my medical decision making (see chart for details).  MDM Reviewed: previous chart, nursing note and vitals Reviewed previous: labs Interpretation: labs and ultrasound   Results for orders placed or performed during the hospital encounter of 03/50/09  Basic metabolic panel  Result Value Ref Range   Sodium 139 135 - 145 mmol/L   Potassium 4.0 3.5 - 5.1 mmol/L   Chloride 105 101 - 111 mmol/L   CO2 25 22 - 32 mmol/L   Glucose, Bld 97 65 - 99 mg/dL   BUN 8 6 - 20 mg/dL   Creatinine, Ser 0.83 0.61 - 1.24 mg/dL   Calcium 9.0 8.9 - 10.3 mg/dL   GFR calc non Af Amer >60 >60 mL/min   GFR calc Af Amer >60 >60 mL/min   Anion gap 9 5 - 15  CBC with Differential  Result Value Ref Range   WBC 8.3 4.0 - 10.5 K/uL   RBC 5.04 4.22 - 5.81 MIL/uL   Hemoglobin 15.1 13.0 - 17.0 g/dL   HCT 45.1 39.0 - 52.0 %   MCV 89.5 78.0 - 100.0 fL   MCH 30.0 26.0 - 34.0 pg   MCHC 33.5 30.0 - 36.0 g/dL   RDW 14.6 11.5 - 15.5 %   Platelets 176 150 - 400 K/uL   Neutrophils Relative % 67 %   Neutro Abs 5.5 1.7 - 7.7 K/uL   Lymphocytes Relative 24 %   Lymphs Abs 2.0 0.7 - 4.0 K/uL   Monocytes Relative 7 %   Monocytes Absolute 0.6 0.1 - 1.0 K/uL   Eosinophils Relative 2 %   Eosinophils Absolute 0.2 0.0 - 0.7 K/uL   Basophils Relative 0 %   Basophils Absolute 0.0 0.0 - 0.1 K/uL  Urinalysis, Routine w reflex microscopic  Result Value Ref Range   Color, Urine YELLOW YELLOW   APPearance CLEAR CLEAR   Specific Gravity, Urine 1.015 1.005 - 1.030   pH 7.0 5.0 - 8.0   Glucose, UA NEGATIVE NEGATIVE mg/dL   Hgb urine dipstick NEGATIVE NEGATIVE   Bilirubin Urine NEGATIVE NEGATIVE   Ketones, ur NEGATIVE NEGATIVE mg/dL   Protein, ur NEGATIVE NEGATIVE mg/dL   Nitrite NEGATIVE NEGATIVE   Leukocytes, UA NEGATIVE NEGATIVE   US Scrotum Result Date: 11/26/2017 CLINICAL DATA:  Right  scrotal region pain EXAM: SCROTAL ULTRASOUND DOPPLER ULTRASOUND OF THE TESTICLES TECHNIQUE: Complete ultrasound examination of the testicles, epididymis, and other scrotal structures was performed. Color and spectral Doppler ultrasound were also utilized to evaluate blood flow to the testicles. COMPARISON:  None. FINDINGS: Right testicle Measurements: 5.0  x 2.6 x 3.2 cm. No mass or microlithiasis visualized. Left testicle Measurements: 5.3 x 2.5 x 3.4 cm. No mass or microlithiasis visualized. Right epididymis: There is a cyst in the head of the epididymis on the right measuring 5 x 3 x 4 mm. No other mass seen on the right. No inflammatory focus or hyperemia. Left epididymis: Normal in size and appearance. No inflammatory focus or hyperemia. Hydrocele:  None visualized. Varicocele:  None visualized. Pulsed Doppler interrogation of both testes demonstrates normal low resistance arterial and venous waveforms bilaterally. No scrotal wall thickening or abscess on either side. IMPRESSION: Subcentimeter cyst in head of the epididymis on the right. No intratesticular mass. No testicular torsion on either side. No inflammatory focus on either side. Electronically Signed   By: Lowella Grip III M.D.   On: 11/26/2017 15:01    Ct Renal Stone Study Result Date: 11/26/2017 CLINICAL DATA:  Right testicular pain EXAM: CT ABDOMEN AND PELVIS WITHOUT CONTRAST TECHNIQUE: Multidetector CT imaging of the abdomen and pelvis was performed following the standard protocol without IV contrast. COMPARISON:  05/23/2017 FINDINGS: Lower chest: No acute abnormality. Hepatobiliary: No focal liver abnormality is seen. Status post cholecystectomy. No biliary dilatation. Pancreas: Unremarkable. No pancreatic ductal dilatation or surrounding inflammatory changes. Spleen: Normal in size without focal abnormality. Adrenals/Urinary Tract: Normal adrenal glands. No kidney stone or mass identified. No hydronephrosis. Stomach/Bowel: Stomach is  within normal limits. Appendix appears normal. No evidence of bowel wall thickening, distention, or inflammatory changes. Vascular/Lymphatic: No significant vascular findings are present. No enlarged abdominal or pelvic lymph nodes. Reproductive: Prostate is unremarkable. Other: No abdominal wall hernia or abnormality. No abdominopelvic ascites. Musculoskeletal: No acute or significant osseous findings. IMPRESSION: 1. No acute findings within the abdomen or pelvis. 2. Previous cholecystectomy. Electronically Signed   By: Kerby Moors M.D.   On: 11/26/2017 15:51    1630:  Workup reassuring. Denies dysuria or penile drainage. Tx symptomatically at this time, f/u Uro MD. Dx and testing d/w pt.  Questions answered.  Verb understanding, agreeable to d/c home with outpt f/u.    Final Clinical Impressions(s) / ED Diagnoses   Final diagnoses:  Testicular pain    ED Discharge Orders    None       Francine Graven, DO 11/28/17 2114

## 2017-11-26 NOTE — ED Notes (Signed)
Pt wanting more pain med. Advised has to wait 30 min from first dose Pt agreed.

## 2017-11-28 ENCOUNTER — Other Ambulatory Visit: Payer: Self-pay

## 2017-11-28 NOTE — Patient Outreach (Signed)
[place] [facility]) Care Management  11/28/2017  [name] 10-03-1976 [id number]   1st Telephone call to patient for ED Utilization screening. A man answered the phone.  When I asked for the patient he began to speak spanish and hung up.  I verified the number to make sure that I dialed it correctly. I was unable to leave a message.  Plan:  RN Health coach will make an outreach attempt to the patient within three business days.  [name] RN, BSN, [place] Direct Dial:  [phone] Fax: [phone]

## 2017-11-30 ENCOUNTER — Other Ambulatory Visit: Payer: Self-pay

## 2017-11-30 ENCOUNTER — Emergency Department (HOSPITAL_COMMUNITY)
Admission: EM | Admit: 2017-11-30 | Discharge: 2017-11-30 | Disposition: A | Discharge status: Home / Self Care | Payer: Medicare HMO | Attending: Emergency Medicine | Admitting: Emergency Medicine

## 2017-11-30 ENCOUNTER — Encounter (HOSPITAL_COMMUNITY): Payer: Self-pay

## 2017-11-30 ENCOUNTER — Emergency Department (HOSPITAL_COMMUNITY): Payer: Medicare HMO

## 2017-11-30 DIAGNOSIS — R0781 Pleurodynia: Secondary | ICD-10-CM

## 2017-11-30 DIAGNOSIS — I10 Essential (primary) hypertension: Secondary | ICD-10-CM | POA: Insufficient documentation

## 2017-11-30 DIAGNOSIS — R079 Chest pain, unspecified: Secondary | ICD-10-CM | POA: Diagnosis not present

## 2017-11-30 DIAGNOSIS — J45909 Unspecified asthma, uncomplicated: Secondary | ICD-10-CM | POA: Insufficient documentation

## 2017-11-30 DIAGNOSIS — R069 Unspecified abnormalities of breathing: Secondary | ICD-10-CM | POA: Diagnosis not present

## 2017-11-30 DIAGNOSIS — Z79899 Other long term (current) drug therapy: Secondary | ICD-10-CM | POA: Diagnosis not present

## 2017-11-30 DIAGNOSIS — F17210 Nicotine dependence, cigarettes, uncomplicated: Secondary | ICD-10-CM | POA: Insufficient documentation

## 2017-11-30 DIAGNOSIS — R0602 Shortness of breath: Secondary | ICD-10-CM | POA: Diagnosis not present

## 2017-11-30 DIAGNOSIS — R0789 Other chest pain: Secondary | ICD-10-CM | POA: Diagnosis present

## 2017-11-30 DIAGNOSIS — R05 Cough: Secondary | ICD-10-CM | POA: Diagnosis not present

## 2017-11-30 DIAGNOSIS — R062 Wheezing: Secondary | ICD-10-CM | POA: Diagnosis not present

## 2017-11-30 LAB — BASIC METABOLIC PANEL
Anion gap: 10 (ref 5–15)
BUN: 16 mg/dL (ref 6–20)
CALCIUM: 9.4 mg/dL (ref 8.9–10.3)
CO2: 25 mmol/L (ref 22–32)
CREATININE: 0.84 mg/dL (ref 0.61–1.24)
Chloride: 104 mmol/L (ref 101–111)
GFR calc Af Amer: >60 mL/min (ref >60)
GLUCOSE: 78 mg/dL (ref 65–99)
POTASSIUM: 3.6 mmol/L (ref 3.5–5.1)
Sodium: 139 mmol/L (ref 135–145)

## 2017-11-30 LAB — CBC
HCT: 46.5 % (ref 39.0–52.0)
Hemoglobin: 15.7 g/dL (ref 13.0–17.0)
MCH: 30.1 pg (ref 26.0–34.0)
MCHC: 33.8 g/dL (ref 30.0–36.0)
MCV: 89.3 fL (ref 78.0–100.0)
PLATELETS: 203 10*3/uL (ref 150–400)
RBC: 5.21 MIL/uL (ref 4.22–5.81)
RDW: 14.6 % (ref 11.5–15.5)
WBC: 11.4 10*3/uL — AB (ref 4.0–10.5)

## 2017-11-30 LAB — D-DIMER, QUANTITATIVE (NOT AT ARMC)

## 2017-11-30 LAB — TROPONIN I

## 2017-11-30 MED ORDER — NAPROXEN 250 MG PO TABS: 250.0000 mg | ORAL_TABLET | Freq: Two times a day (BID) | ORAL | 0 refills | Status: DC | PRN

## 2017-11-30 MED ORDER — LORAZEPAM 2 MG/ML IJ SOLN
1.0000 mg | Freq: Once | INTRAMUSCULAR | Status: AC
  Administered 2017-11-30: 1 mg via INTRAVENOUS
  Filled 2017-11-30: qty 1

## 2017-11-30 MED ORDER — ACETAMINOPHEN 325 MG PO TABS
650.0000 mg | ORAL_TABLET | Freq: Once | ORAL | Status: AC
  Administered 2017-11-30: 650 mg via ORAL
  Filled 2017-11-30: qty 2

## 2017-11-30 NOTE — ED Notes (Signed)
Patient stating he wants to leave, "[name] been sitting here 2 hours and nothing has been done". Writer informed patient labs are in process and not complete. Dr. Tomi Bamberger made aware and speaking with patient at this time.

## 2017-11-30 NOTE — ED Notes (Signed)
Patient requesting pain medication. EDP made aware. Verbal orders obtained.

## 2017-11-30 NOTE — ED Provider Notes (Signed)
[facility] EMERGENCY DEPARTMENT Provider Note   CSN: [id number] Arrival date & time: 11/30/17  1415     History   Chief Complaint Chief Complaint  Patient presents with  . Shortness of Breath    HPI [name] is a 42 y.o. male.  HPI Patient presents emergency room with acute onset of chest pain and shortness of breath.  Patient states he was sitting on the couch when all of a sudden he started to feel short of breath.  He has sharp pain in the center of his chest that goes to his back.  He has had an occasional cough but he is attributed that to smoking.  He has had nausea.  He feels like he cannot catch his breath and he has been hyperventilating.  Patient is feeling tingling and feels like he needs to jump out of his skin. no history of PE or DVT no history of heart or lung disease.  No complaints of leg swelling.  No recent trips or travel.  No history of cancer. Past Medical History:  Diagnosis Date  . Anxiety   . Asthma   . Bipolar 1 disorder ([place])   . Chronic back pain   . Complication of anesthesia    Anxiety when he awaken with ET tube still in place  . GERD (gastroesophageal reflux disease)   . Hyperlipidemia   . Hypertension   . Hypoventilation syndrome   . Left testicular torsion    "20 years ago"  . Lumbar radiculopathy   . Migraines   . Schizoaffective disorder ([place])    [place]    Patient Active Problem List   Diagnosis Date Noted  . Schizophrenia ([place]) 05/28/2016  . Abdominal pain, epigastric 02/04/2013  . Constipation 12/02/2012  . Abdominal pain 11/22/2012  . INSOMNIA 04/26/2009  . BACK PAIN, LUMBAR 03/20/2009  . BIPOLAR AFFECTIVE DISORDER 02/17/2009  . OTHER ENTHESOPATHY OF ANKLE AND TARSUS 10/05/2008  . GERD 09/16/2008  . PERSONAL HISTORY OF SCHIZOPHRENIA 05/27/2008  . ESOPHAGITIS, REFLUX 03/23/2008  . HIATAL HERNIA 03/23/2008  . HYPERLIPIDEMIA 01/07/2008  . MORBID OBESITY 01/07/2008  . ANXIETY DEPRESSION 01/07/2008  . TOBACCO  ABUSE 01/07/2008  . HYPERTENSION 01/07/2008  . ALLERGIC RHINITIS 01/07/2008  . IBS 01/07/2008    Past Surgical History:  Procedure Laterality Date  . ANKLE SURGERY    . APPENDECTOMY    . CHOLECYSTECTOMY    . ESOPHAGOGASTRODUODENOSCOPY  03/17/2008   RMR: A tiny distal esophageal erosions consistent with mild  erosive reflux esophagitis, otherwise normal esophagus, small hiatal hernia, minimally polypoid antral mucosa with doubtful clinical was significance.  Otherwise, normal stomach, patent pylorus, and normal D1-D2  . ESOPHAGOGASTRODUODENOSCOPY (EGD) WITH PROPOFOL N/A 02/18/2013   Procedure: ESOPHAGOGASTRODUODENOSCOPY (EGD) WITH PROPOFOL;  Surgeon: Daneil Dolin, MD;  Location: [facility];  Service: Endoscopy;  Laterality: N/A;  . NASAL SINUS SURGERY  01/24/2012   Procedure: ENDOSCOPIC SINUS SURGERY;  Surgeon: Izora Gala, MD;  Location: [place];  Service: ENT;  Laterality: Left;  left ethmoidectomy, maxillary, frontal  . UMBILICAL HERNIA REPAIR         Home Medications    Prior to Admission medications   Medication Sig Start Date End Date Taking? Authorizing Provider  albuterol (PROVENTIL HFA;VENTOLIN HFA) 108 (90 BASE) MCG/ACT inhaler Inhale 1-2 puffs into the lungs every 6 (six) hours as needed for wheezing. 04/21/12 11/09/18  Prentiss Bells, MD  ALPRAZolam Duanne Moron) 1 MG tablet Take 1 mg by mouth 4 (four) times daily.  [provider]  ARIPiprazole (ABILIFY) 20 MG tablet Take 20 mg by mouth at bedtime.     [provider]  baclofen (LIORESAL) 20 MG tablet Take 20 mg by mouth 3 (three) times daily. 11/02/17   [provider]  citalopram (CELEXA) 40 MG tablet Take 40 mg by mouth every morning.     [provider]  doxycycline (VIBRA-TABS) 100 MG tablet Take 1 tablet (100 mg total) by mouth 2 (two) times daily. 11/26/17   Francine Graven, DO  EPINEPHrine (EPIPEN 2-PAK) 0.3 mg/0.3 mL IJ SOAJ injection Inject as directed.    [provider]    gabapentin (NEURONTIN) 300 MG capsule Take 1 capsule by mouth 3 (three) times daily. 11/20/17   [provider]  HYDROcodone-acetaminophen (NORCO/VICODIN) 5-325 MG tablet Take 1-2 tablets by mouth every 6 (six) hours as needed. Patient not taking: Reported on 11/09/2017 10/11/17   Fredia Sorrow, MD  methylphenidate (RITALIN) 20 MG tablet Take 20 mg by mouth daily. 10/28/17   [provider]  naproxen (NAPROSYN) 250 MG tablet Take 1 tablet (250 mg total) by mouth 2 (two) times daily as needed for mild pain or moderate pain (take with food). 11/30/17   Dorie Rank, MD  pantoprazole (PROTONIX) 40 MG tablet Take 40 mg by mouth at bedtime.     [provider]  temazepam (RESTORIL) 30 MG capsule Take 1 capsule by mouth daily. 11/24/17   [provider]  traMADol (ULTRAM) 50 MG tablet Take 50 mg by mouth 4 (four) times daily.     [provider]    Family History Family History  Problem Relation Age of Onset  . Anesthesia problems Mother   . Colon cancer Maternal Grandfather   . Crohn's disease Maternal Grandfather     Social History Social History   Tobacco Use  . Smoking status: Current Every Day Smoker    Packs/day: 1.00    Years: 22.00    Pack years: 22.00    Types: Cigarettes  . Smokeless tobacco: Never Used  Substance Use Topics  . Alcohol use: No  . Drug use: No     Allergies   Compazine; Imitrex [sumatriptan base]; and Risperidone   Review of Systems Review of Systems  All other systems reviewed and are negative.    Physical Exam Updated Vital Signs BP 106/60 (BP Location: Left Arm)   Pulse 82   Temp 97.8 F (36.6 C) (Oral)   Resp 19   Ht 1.905 m (6\' 3" )   Wt 133.8 kg (295 lb)   SpO2 97%   BMI 36.87 kg/m   Physical Exam  Constitutional: No distress.  Anxious  HENT:  Head: Normocephalic and atraumatic.  Right Ear: External ear normal.  Left Ear: External ear normal.  Eyes: Conjunctivae are normal. Right eye  exhibits no discharge. Left eye exhibits no discharge. No scleral icterus.  Neck: Neck supple. No tracheal deviation present.  Cardiovascular: Normal rate, regular rhythm and intact distal pulses.  Pulmonary/Chest: Effort normal and breath sounds normal. No stridor. Tachypnea (Hyperventilating) noted. No respiratory distress. He has no wheezes. He has no rales.  Abdominal: Soft. Bowel sounds are normal. He exhibits no distension. There is no tenderness. There is no rebound and no guarding.  Musculoskeletal: He exhibits no edema or tenderness.  Neurological: He is alert. He has normal strength. No cranial nerve deficit (no facial droop, extraocular movements intact, no slurred speech) or sensory deficit. He exhibits normal muscle tone. He displays no  seizure activity. Coordination normal.  Skin: Skin is warm and dry. No rash noted.  Psychiatric: He has a normal mood and affect.  Nursing note and vitals reviewed.    ED Treatments / Results  Labs (all labs ordered are listed, but only abnormal results are displayed) Labs Reviewed  CBC - Abnormal; Notable for the following components:      Result Value   WBC 11.4 (*)    All other components within normal limits  BASIC METABOLIC PANEL  D-DIMER, QUANTITATIVE (NOT AT [facility])  TROPONIN I    EKG  EKG Interpretation  Date/Time:  Sunday November 30 2017 14:18:40 EST Ventricular Rate:  98 PR Interval:    QRS Duration: 81 QT Interval:  349 QTC Calculation: 446 R Axis:   65 Text Interpretation:  Sinus rhythm Minimal ST depression, inferior leads Baseline wander in lead(s) II III aVF V1 V2 V3 V4 V5 V6 No significant change since last tracing Confirmed by Tavaughn Silguero ([id number]) on 11/30/2017 2:28:30 PM       Radiology Dg Chest 2 View  Result Date: 11/30/2017 CLINICAL DATA:  Shortness of breath and chest pain EXAM: CHEST  2 VIEW COMPARISON:  September 10, 2017 FINDINGS: There is no edema or consolidation. The heart size and pulmonary vascularity are  normal. No adenopathy. No pneumothorax. There are no evident bone lesions. IMPRESSION: No edema or consolidation. Electronically Signed   By: William  Woodruff III M.D.   On: 11/30/2017 15:29    Procedures Procedures (including critical care time)  Medications Ordered in ED Medications  LORazepam (ATIVAN) injection 1 mg (1 mg Intravenous Given 11/30/17 1503)  acetaminophen (TYLENOL) tablet 650 mg (650 mg Oral Given 11/30/17 1616)     Initial Impression / Assessment and Plan / ED Course  I have reviewed the triage vital signs and the nursing notes.  Pertinent labs & imaging results that were available during my care of the patient were reviewed by me and considered in my medical decision making (see chart for details).   Patient presented to the emergency room with complaints of sharp chest pain.  She has a history of epidural reflux but no history of heart disease.  Symptoms somewhat atypical in nature for acute coronary syndrome.  Initial ED evaluation is reassuring.  At 2:30 PM the patient indicated that he want to leave.  We had not done anything for him.  I explained to the patient that we are waiting on his test results.  He was given medications for pain unfortunately his chest still hurts.  The patient is not interested in staying any longer for additional testing such as serial cardiac enzymes.  Overall however I feel he is low risk and I do not think this is entirely unreasonable that he wants to go home.  Recommend NSAIDs.  Follow-up with his primary doctor if not improving.  Final Clinical Impressions(s) / ED Diagnoses   Final diagnoses:  Pleurodynia    ED Discharge Orders        Ordered    naproxen (NAPROSYN) 250 MG tablet  2 times daily PRN     01 /20/19 1635       Dorie Rank, MD 11/30/17 [phone]

## 2017-11-30 NOTE — Discharge Instructions (Signed)
Take medication as needed for pain, follow-up with your primary doctor next week the symptoms persist, monitor for fever worsening symptoms

## 2017-11-30 NOTE — ED Triage Notes (Addendum)
Patient brought in by [facility] from home shortness of breath/chest pain while sitting on cough this morning. Patient  States he had productive cough that started this morning as well. Per EMS patient vomited 4 times en route. Patient hyperventilating.   EMS gave 2.5 albuterol nebulizer / 4mg  Zofran IVP.

## 2017-12-02 ENCOUNTER — Other Ambulatory Visit: Payer: Self-pay

## 2017-12-02 DIAGNOSIS — F209 Schizophrenia, unspecified: Secondary | ICD-10-CM | POA: Diagnosis not present

## 2017-12-02 DIAGNOSIS — J441 Chronic obstructive pulmonary disease with (acute) exacerbation: Secondary | ICD-10-CM | POA: Diagnosis not present

## 2017-12-02 DIAGNOSIS — I10 Essential (primary) hypertension: Secondary | ICD-10-CM | POA: Diagnosis not present

## 2017-12-02 NOTE — Patient Outreach (Signed)
[place] [facility]) Care Management  12/02/2017  [name] January 26, 1976 [id number]   2nd Telephone call to patient for ED Utilization screening.  Patient answered the phone and stated that he did not feel well and wasn't up to speaking with me.  Plan: RN Health coach will make an outreach attempt to the patient within three business days.  [name] RN, BSN, [place] Direct Dial:  [phone] Fax: [phone]

## 2017-12-03 ENCOUNTER — Other Ambulatory Visit: Payer: Self-pay

## 2017-12-03 NOTE — Patient Outreach (Signed)
[place] [facility]) Care Management  12/03/2017  ESTEPHAN GALLARDO Jan 15, 1976 [id number]   3rd Telephone call to patient for ED Utilization screening.  No answer.  HIPAA complaint voice message left with contact information.    Plan:  RN Health Coach will send letter to attempt outreach.  If no response within ten business days will proceed with case closure.    [name] RN, BSN, [place] Direct Dial:  [phone] Fax: [phone]

## 2017-12-08 DIAGNOSIS — H5210 Myopia, unspecified eye: Secondary | ICD-10-CM | POA: Diagnosis not present

## 2017-12-08 DIAGNOSIS — Z0100 Encounter for examination of eyes and vision without abnormal findings: Secondary | ICD-10-CM | POA: Diagnosis not present

## 2017-12-21 ENCOUNTER — Emergency Department (HOSPITAL_COMMUNITY)
Admission: EM | Admit: 2017-12-21 | Discharge: 2017-12-21 | Disposition: A | Discharge status: Home / Self Care | Payer: Medicare HMO | Attending: Emergency Medicine | Admitting: Emergency Medicine

## 2017-12-21 ENCOUNTER — Emergency Department (HOSPITAL_COMMUNITY): Payer: Medicare HMO

## 2017-12-21 ENCOUNTER — Encounter (HOSPITAL_COMMUNITY): Payer: Self-pay | Admitting: Emergency Medicine

## 2017-12-21 ENCOUNTER — Other Ambulatory Visit: Payer: Self-pay

## 2017-12-21 DIAGNOSIS — Y998 Other external cause status: Secondary | ICD-10-CM | POA: Insufficient documentation

## 2017-12-21 DIAGNOSIS — S161XXA Strain of muscle, fascia and tendon at neck level, initial encounter: Secondary | ICD-10-CM

## 2017-12-21 DIAGNOSIS — I10 Essential (primary) hypertension: Secondary | ICD-10-CM | POA: Insufficient documentation

## 2017-12-21 DIAGNOSIS — Z79899 Other long term (current) drug therapy: Secondary | ICD-10-CM | POA: Insufficient documentation

## 2017-12-21 DIAGNOSIS — F17210 Nicotine dependence, cigarettes, uncomplicated: Secondary | ICD-10-CM | POA: Diagnosis not present

## 2017-12-21 DIAGNOSIS — Y9389 Activity, other specified: Secondary | ICD-10-CM | POA: Diagnosis not present

## 2017-12-21 DIAGNOSIS — R51 Headache: Secondary | ICD-10-CM | POA: Insufficient documentation

## 2017-12-21 DIAGNOSIS — S199XXA Unspecified injury of neck, initial encounter: Secondary | ICD-10-CM | POA: Diagnosis present

## 2017-12-21 DIAGNOSIS — J45909 Unspecified asthma, uncomplicated: Secondary | ICD-10-CM | POA: Insufficient documentation

## 2017-12-21 DIAGNOSIS — Y92838 Other recreation area as the place of occurrence of the external cause: Secondary | ICD-10-CM | POA: Diagnosis not present

## 2017-12-21 DIAGNOSIS — M542 Cervicalgia: Secondary | ICD-10-CM | POA: Diagnosis not present

## 2017-12-21 DIAGNOSIS — M25511 Pain in right shoulder: Secondary | ICD-10-CM | POA: Diagnosis not present

## 2017-12-21 MED ORDER — CYCLOBENZAPRINE HCL 10 MG PO TABS
10.0000 mg | ORAL_TABLET | Freq: Once | ORAL | Status: AC
  Administered 2017-12-21: 10 mg via ORAL
  Filled 2017-12-21: qty 1

## 2017-12-21 MED ORDER — IBUPROFEN 800 MG PO TABS: 800.0000 mg | ORAL_TABLET | Freq: Three times a day (TID) | ORAL | 0 refills | Status: DC

## 2017-12-21 MED ORDER — CYCLOBENZAPRINE HCL 10 MG PO TABS: 10.0000 mg | ORAL_TABLET | Freq: Three times a day (TID) | ORAL | 0 refills | Status: DC | PRN

## 2017-12-21 MED ORDER — OXYCODONE-ACETAMINOPHEN 5-325 MG PO TABS
1.0000 | ORAL_TABLET | Freq: Once | ORAL | Status: AC
  Administered 2017-12-21: 1 via ORAL
  Filled 2017-12-21: qty 1

## 2017-12-21 MED ORDER — HYDROCODONE-ACETAMINOPHEN 5-325 MG PO TABS: ORAL_TABLET | ORAL | 0 refills | Status: DC

## 2017-12-21 NOTE — Discharge Instructions (Signed)
Apply ice packs on and off to your neck.  Follow-up with your primary doctor or with Dr. Aline Brochure for recheck in 1 week if not improving.
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
## 2017-12-22 ENCOUNTER — Other Ambulatory Visit: Payer: Self-pay

## 2017-12-22 DIAGNOSIS — F209 Schizophrenia, unspecified: Secondary | ICD-10-CM | POA: Diagnosis not present

## 2017-12-22 DIAGNOSIS — I10 Essential (primary) hypertension: Secondary | ICD-10-CM | POA: Diagnosis not present

## 2017-12-22 DIAGNOSIS — J449 Chronic obstructive pulmonary disease, unspecified: Secondary | ICD-10-CM | POA: Diagnosis not present

## 2017-12-22 NOTE — Patient Outreach (Signed)
[place] [facility]) Care Management  12/22/2017  [name] 1976/08/14 [id number]   Patient has not responded to calls or letter. Will proceed with case closure.    Plan:  RN Health Coach will notify care management assistant of case status.    [name] RN, BSN, [place] Direct Dial:  [phone] Fax: [phone]
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
## 2017-12-24 ENCOUNTER — Other Ambulatory Visit: Payer: Self-pay | Admitting: *Deleted

## 2017-12-24 ENCOUNTER — Ambulatory Visit (INDEPENDENT_AMBULATORY_CARE_PROVIDER_SITE_OTHER): Payer: Medicare HMO | Admitting: Nurse Practitioner

## 2017-12-24 ENCOUNTER — Encounter: Payer: Self-pay | Admitting: *Deleted

## 2017-12-24 ENCOUNTER — Encounter: Payer: Self-pay | Admitting: Nurse Practitioner

## 2017-12-24 VITALS — BP 167/85 | HR 99 | Temp 97.4°F | Ht 75.0 in | Wt 326.8 lb

## 2017-12-24 DIAGNOSIS — R1319 Other dysphagia: Secondary | ICD-10-CM

## 2017-12-24 DIAGNOSIS — K219 Gastro-esophageal reflux disease without esophagitis: Secondary | ICD-10-CM

## 2017-12-24 DIAGNOSIS — R1310 Dysphagia, unspecified: Secondary | ICD-10-CM | POA: Diagnosis not present

## 2017-12-24 DIAGNOSIS — K2100 Gastro-esophageal reflux disease with esophagitis, without bleeding: Secondary | ICD-10-CM

## 2017-12-24 DIAGNOSIS — K3184 Gastroparesis: Secondary | ICD-10-CM

## 2017-12-24 MED ORDER — SUCRALFATE 1 G PO TABS: 1.0000 g | ORAL_TABLET | Freq: Four times a day (QID) | ORAL | 1 refills | Status: DC | PRN

## 2017-12-24 NOTE — Patient Instructions (Signed)
1. We will help schedule your stomach emptying test. 2. Continue to take Protonix twice a day. 3. I sent in a prescription for Carafate 1 g to your pharmacy.  You can crush up this tablet, mix it with a little bit of water and drink the mixture.  You can use this up to 4 times a day if needed for breakthrough heartburn/reflux. 4. We will schedule your upper endoscopy with possible dilation for you. 5. Return for follow-up in 3 months. 6. Call if you have any questions or concerns.

## 2017-12-24 NOTE — Assessment & Plan Note (Signed)
3 of severe GERD symptoms and significant reflux esophagitis.  He has tried essentially all PPIs and H2 receptor blockers.  The only medication he may not have tried his AcipHex.  He is currently on Protonix twice a day.  Recommend he continue this for now, I will send in Carafate 1 g 3 times a day as needed.  We will have him complete his gastric emptying study due to retained contents on his last EGD.  We will plan for EGD with possible dilation as per above.  He states when he saw gastroenterology in [place] they recommended possible Nissen fundoplication.  He is interested in this at this time given his persistent symptoms which are not responsive to medications.  He is morbidly obese and this is likely worsening his symptoms.  Recommend return for follow-up in 3 months.

## 2017-12-24 NOTE — Progress Notes (Signed)
Primary Care Physician:  Sinda Du, MD Primary Gastroenterologist:  Dr. Gala Romney  Chief Complaint  Patient presents with  . Gastroesophageal Reflux    SEVERE, per pt.   . Heartburn    feels like fire per pt.  . Emesis    acid    HPI:   [name] is a 42 y.o. male who presents on referral from primary care for GERD.  The patient has not been seen in our office since 02/04/2013.  At that point he was seen for GERD, constipation, epigastric abdominal pain.  EGD on file from May 2009 with erosive esophagitis.  Status post cholecystectomy.  He was not having improvement at that time on Prilosec, Protonix, or Nexium.  Did have improvement with Dexilant.  History of chronic constipation at one point requiring a MiraLAX purge.  Vomits to 3 times a week, abdominal cramping after eating.  Appetite poor.  Has gone as long as 2 weeks without a bowel movement.  Had results with MiraLAX but then went right back to constipation.  No rectal bleeding at that time.  Recommended Linzess daily, continue Dexilant, CT of the abdomen and pelvis, upper endoscopy.  CT completed 02/05/2013 which found no acute abnormality, no evidence of hernia, status post cholecystectomy.  EGD completed 02/18/2013 which found gastric contents precluding complete examination and suspect gastroparesis at play.  Recommended solid phase gastric emptying study.  Does not appear this was completed.  Follow-up office visit in 2018 he was a no-show.  Today he states he's having severe reflux. Has had chronic GERD for a number of years. Saw a GI in [place] and they were going to do "something laparoscopic to help cause I'm throwing up acid." Has tried Nexium, Prilosec, Protonix, Dexilant, Zantac, Pepcid. Has not tried Aciphex. Is currently on Protonix bid. Denies abdominal pain. Admits nausea and vomiting of acid. Symptoms have been "this bad for about 7-8 years." Has solid food dysphagia about once a day, occasional regurgitation.  Denies hematochezia, melena, unintentional weight loss, fever, chills, acute changes in bowel habits. Has waxing and waning bowel movements, sometimes "right after eating" and sometimes will go 3 days with no bowel movement. Did not have GES done after his last EGD. States he did have one in [place]. Denies chest pain, dyspnea, dizziness, lightheadedness, syncope, near syncope. Denies any other upper or lower GI symptoms.  Denies NSAIDs and ASA powders.  Past Medical History:  Diagnosis Date  . Anxiety   . Asthma   . Bipolar 1 disorder ([place])   . Chronic back pain   . Complication of anesthesia    Anxiety when he awaken with ET tube still in place  . GERD (gastroesophageal reflux disease)   . Hyperlipidemia   . Hypertension   . Hypoventilation syndrome   . Left testicular torsion    "20 years ago"  . Lumbar radiculopathy   . Migraines   . Schizoaffective disorder ([place])    [name]    Past Surgical History:  Procedure Laterality Date  . ANKLE SURGERY    . APPENDECTOMY    . CHOLECYSTECTOMY    . ESOPHAGOGASTRODUODENOSCOPY  03/17/2008   RMR: A tiny distal esophageal erosions consistent with mild  erosive reflux esophagitis, otherwise normal esophagus, small hiatal hernia, minimally polypoid antral mucosa with doubtful clinical was significance.  Otherwise, normal stomach, patent pylorus, and normal D1-D2  . ESOPHAGOGASTRODUODENOSCOPY (EGD) WITH PROPOFOL N/A 02/18/2013   Procedure: ESOPHAGOGASTRODUODENOSCOPY (EGD) WITH PROPOFOL;  Surgeon: Daneil Dolin,  MD;  Location: [facility];  Service: Endoscopy;  Laterality: N/A;  . NASAL SINUS SURGERY  01/24/2012   Procedure: ENDOSCOPIC SINUS SURGERY;  Surgeon: Izora Gala, MD;  Location: [place] [facility];  Service: ENT;  Laterality: Left;  left ethmoidectomy, maxillary, frontal  . UMBILICAL HERNIA REPAIR      Current Outpatient Medications  Medication Sig Dispense Refill  . ALPRAZolam (XANAX) 1 MG tablet Take 1 mg by mouth 4 (four) times daily.     .  ARIPiprazole (ABILIFY) 20 MG tablet Take 20 mg by mouth at bedtime.     . baclofen (LIORESAL) 20 MG tablet Take 20 mg by mouth 3 (three) times daily.  5  . citalopram (CELEXA) 40 MG tablet Take 40 mg by mouth every morning.     . gabapentin (NEURONTIN) 300 MG capsule Take 1 capsule by mouth 3 (three) times daily.    . methylphenidate (RITALIN) 20 MG tablet Take 20 mg by mouth daily.  0  . pantoprazole (PROTONIX) 40 MG tablet Take 40 mg by mouth at bedtime.     . temazepam (RESTORIL) 30 MG capsule Take 1 capsule by mouth daily.    . traMADol (ULTRAM) 50 MG tablet Take 50 mg by mouth 4 (four) times daily.      No current facility-administered medications for this visit.     Allergies as of 12/24/2017 - Review Complete 12/24/2017  Allergen Reaction Noted  . Compazine Shortness Of Breath 11/01/2011  . Imitrex [sumatriptan base] Swelling 10/18/2011  . Risperidone and related Other (See Comments) 12/21/2017  . Risperidone Rash 01/07/2008    Family History  Problem Relation Age of Onset  . Anesthesia problems Mother   . Colon cancer Maternal Grandfather   . Crohn's disease Maternal Grandfather   . Gastric cancer Paternal Uncle   . Esophageal cancer Neg Hx     Social History   Socioeconomic History  . Marital status: Divorced    Spouse name: Not on file  . Number of children: Not on file  . Years of education: Not on file  . Highest education level: Not on file  Social Needs  . Financial resource strain: Not on file  . Food insecurity - worry: Not on file  . Food insecurity - inability: Not on file  . Transportation needs - medical: Not on file  . Transportation needs - non-medical: Not on file  Occupational History  . Not on file  Tobacco Use  . Smoking status: Current Every Day Smoker    Packs/day: 1.00    Years: 22.00    Pack years: 22.00    Types: Cigarettes  . Smokeless tobacco: Never Used  Substance and Sexual Activity  . Alcohol use: Yes    Comment: Rare: 1 beer  every 6-7 months  . Drug use: No  . Sexual activity: Not on file  Other Topics Concern  . Not on file  Social History Narrative  . Not on file    Review of Systems: General: Negative for anorexia, weight loss, fever, chills, fatigue, weakness. ENT: Negative for hoarseness. Admits solid food dysphagia. CV: Negative for chest pain, angina, palpitations, peripheral edema.  Respiratory: Negative for dyspnea at rest, cough, sputum, wheezing.  GI: See history of present illness. MS: Chronic pain on disability.  Derm: Negative for rash or itching.  Endo: Negative for unusual weight change.  Heme: Negative for bruising or bleeding. Allergy: Negative for rash or hives.    Physical Exam: BP (!) 167/85   Pulse 99  Temp (!) 97.4 F (36.3 C) (Oral)   Ht 6\' 3"  (1.905 m)   Wt (!) 326 lb 12.8 oz (148.2 kg)   BMI 40.85 kg/m  General:   Obese male. Alert and oriented. Pleasant and cooperative. Well-nourished and well-developed.  Head:  Normocephalic and atraumatic. Eyes:  Without icterus, sclera clear and conjunctiva pink.  Ears:  Normal auditory acuity. Cardiovascular:  S1, S2 present without murmurs appreciated. Extremities without clubbing or edema. Respiratory:  Clear to auscultation bilaterally. No wheezes, rales, or rhonchi. No distress.  Gastrointestinal:  +BS, obese but soft, non-tender and non-distended. No HSM noted. No guarding or rebound. No masses appreciated.  Rectal:  Deferred  Musculoskalatal:  Symmetrical without gross deformities. Neurologic:  Alert and oriented x4;  grossly normal neurologically. Psych:  Alert and cooperative. Normal mood and affect. Heme/Lymph/Immune: No excessive bruising noted.    12/24/2017 3:55 PM   Disclaimer: This note was dictated with voice recognition software. Similar sounding words can inadvertently be transcribed and may not be corrected upon review.

## 2017-12-24 NOTE — Assessment & Plan Note (Signed)
The patient admits solid food dysphasia approximately once a day.  He does have a chronic history of long-standing GERD which has been difficult to manage on medications, as per below.  He did have an EGD in 2014 which found retained gastric contents and recommend a gastric emptying study.  This was not done.  At this point, given his new onset dysphagia we will repeat an upper endoscopy to further evaluate and consider dilation if needed.  Return for follow-up in 3 months.  Proceed with EGD +/- dilation on propofol/MAC with Dr. Gala Romney in near future: the risks, benefits, and alternatives have been discussed with the patient in detail. The patient states understanding and desires to proceed.  Patient is currently on Xanax, Abilify, Celexa, Neurontin, Restoril, Ultram.  No other anticoagulants, anxiolytics, chronic pain medications, or antidepressants.  We will plan for the procedure on propofol/MAC to promote adequate sedation.

## 2017-12-24 NOTE — H&P (View-Only) (Signed)
Primary Care Physician:  Sinda Du, MD Primary Gastroenterologist:  Dr. Gala Romney  Chief Complaint  Patient presents with  . Gastroesophageal Reflux    SEVERE, per pt.   . Heartburn    feels like fire per pt.  . Emesis    acid    HPI:   [name] is a 42 y.o. male who presents on referral from primary care for GERD.  The patient has not been seen in our office since 02/04/2013.  At that point he was seen for GERD, constipation, epigastric abdominal pain.  EGD on file from May 2009 with erosive esophagitis.  Status post cholecystectomy.  He was not having improvement at that time on Prilosec, Protonix, or Nexium.  Did have improvement with Dexilant.  History of chronic constipation at one point requiring a MiraLAX purge.  Vomits to 3 times a week, abdominal cramping after eating.  Appetite poor.  Has gone as long as 2 weeks without a bowel movement.  Had results with MiraLAX but then went right back to constipation.  No rectal bleeding at that time.  Recommended Linzess daily, continue Dexilant, CT of the abdomen and pelvis, upper endoscopy.  CT completed 02/05/2013 which found no acute abnormality, no evidence of hernia, status post cholecystectomy.  EGD completed 02/18/2013 which found gastric contents precluding complete examination and suspect gastroparesis at play.  Recommended solid phase gastric emptying study.  Does not appear this was completed.  Follow-up office visit in 2018 he was a no-show.  Today he states he's having severe reflux. Has had chronic GERD for a number of years. Saw a GI in [place] and they were going to do "something laparoscopic to help cause I'm throwing up acid." Has tried Nexium, Prilosec, Protonix, Dexilant, Zantac, Pepcid. Has not tried Aciphex. Is currently on Protonix bid. Denies abdominal pain. Admits nausea and vomiting of acid. Symptoms have been "this bad for about 7-8 years." Has solid food dysphagia about once a day, occasional regurgitation.  Denies hematochezia, melena, unintentional weight loss, fever, chills, acute changes in bowel habits. Has waxing and waning bowel movements, sometimes "right after eating" and sometimes will go 3 days with no bowel movement. Did not have GES done after his last EGD. States he did have one in [place]. Denies chest pain, dyspnea, dizziness, lightheadedness, syncope, near syncope. Denies any other upper or lower GI symptoms.  Denies NSAIDs and ASA powders.  Past Medical History:  Diagnosis Date  . Anxiety   . Asthma   . Bipolar 1 disorder ([place])   . Chronic back pain   . Complication of anesthesia    Anxiety when he awaken with ET tube still in place  . GERD (gastroesophageal reflux disease)   . Hyperlipidemia   . Hypertension   . Hypoventilation syndrome   . Left testicular torsion    "20 years ago"  . Lumbar radiculopathy   . Migraines   . Schizoaffective disorder ([place])    [name]    Past Surgical History:  Procedure Laterality Date  . ANKLE SURGERY    . APPENDECTOMY    . CHOLECYSTECTOMY    . ESOPHAGOGASTRODUODENOSCOPY  03/17/2008   RMR: A tiny distal esophageal erosions consistent with mild  erosive reflux esophagitis, otherwise normal esophagus, small hiatal hernia, minimally polypoid antral mucosa with doubtful clinical was significance.  Otherwise, normal stomach, patent pylorus, and normal D1-D2  . ESOPHAGOGASTRODUODENOSCOPY (EGD) WITH PROPOFOL N/A 02/18/2013   Procedure: ESOPHAGOGASTRODUODENOSCOPY (EGD) WITH PROPOFOL;  Surgeon: Daneil Dolin,  MD;  Location: [facility];  Service: Endoscopy;  Laterality: N/A;  . NASAL SINUS SURGERY  01/24/2012   Procedure: ENDOSCOPIC SINUS SURGERY;  Surgeon: Izora Gala, MD;  Location: [place] [facility];  Service: ENT;  Laterality: Left;  left ethmoidectomy, maxillary, frontal  . UMBILICAL HERNIA REPAIR      Current Outpatient Medications  Medication Sig Dispense Refill  . ALPRAZolam (XANAX) 1 MG tablet Take 1 mg by mouth 4 (four) times daily.     .  ARIPiprazole (ABILIFY) 20 MG tablet Take 20 mg by mouth at bedtime.     . baclofen (LIORESAL) 20 MG tablet Take 20 mg by mouth 3 (three) times daily.  5  . citalopram (CELEXA) 40 MG tablet Take 40 mg by mouth every morning.     . gabapentin (NEURONTIN) 300 MG capsule Take 1 capsule by mouth 3 (three) times daily.    . methylphenidate (RITALIN) 20 MG tablet Take 20 mg by mouth daily.  0  . pantoprazole (PROTONIX) 40 MG tablet Take 40 mg by mouth at bedtime.     . temazepam (RESTORIL) 30 MG capsule Take 1 capsule by mouth daily.    . traMADol (ULTRAM) 50 MG tablet Take 50 mg by mouth 4 (four) times daily.      No current facility-administered medications for this visit.     Allergies as of 12/24/2017 - Review Complete 12/24/2017  Allergen Reaction Noted  . Compazine Shortness Of Breath 11/01/2011  . Imitrex [sumatriptan base] Swelling 10/18/2011  . Risperidone and related Other (See Comments) 12/21/2017  . Risperidone Rash 01/07/2008    Family History  Problem Relation Age of Onset  . Anesthesia problems Mother   . Colon cancer Maternal Grandfather   . Crohn's disease Maternal Grandfather   . Gastric cancer Paternal Uncle   . Esophageal cancer Neg Hx     Social History   Socioeconomic History  . Marital status: Divorced    Spouse name: Not on file  . Number of children: Not on file  . Years of education: Not on file  . Highest education level: Not on file  Social Needs  . Financial resource strain: Not on file  . Food insecurity - worry: Not on file  . Food insecurity - inability: Not on file  . Transportation needs - medical: Not on file  . Transportation needs - non-medical: Not on file  Occupational History  . Not on file  Tobacco Use  . Smoking status: Current Every Day Smoker    Packs/day: 1.00    Years: 22.00    Pack years: 22.00    Types: Cigarettes  . Smokeless tobacco: Never Used  Substance and Sexual Activity  . Alcohol use: Yes    Comment: Rare: 1 beer  every 6-7 months  . Drug use: No  . Sexual activity: Not on file  Other Topics Concern  . Not on file  Social History Narrative  . Not on file    Review of Systems: General: Negative for anorexia, weight loss, fever, chills, fatigue, weakness. ENT: Negative for hoarseness. Admits solid food dysphagia. CV: Negative for chest pain, angina, palpitations, peripheral edema.  Respiratory: Negative for dyspnea at rest, cough, sputum, wheezing.  GI: See history of present illness. MS: Chronic pain on disability.  Derm: Negative for rash or itching.  Endo: Negative for unusual weight change.  Heme: Negative for bruising or bleeding. Allergy: Negative for rash or hives.    Physical Exam: BP (!) 167/85   Pulse 99  Temp (!) 97.4 F (36.3 C) (Oral)   Ht 6\' 3"  (1.905 m)   Wt (!) 326 lb 12.8 oz (148.2 kg)   BMI 40.85 kg/m  General:   Obese male. Alert and oriented. Pleasant and cooperative. Well-nourished and well-developed.  Head:  Normocephalic and atraumatic. Eyes:  Without icterus, sclera clear and conjunctiva pink.  Ears:  Normal auditory acuity. Cardiovascular:  S1, S2 present without murmurs appreciated. Extremities without clubbing or edema. Respiratory:  Clear to auscultation bilaterally. No wheezes, rales, or rhonchi. No distress.  Gastrointestinal:  +BS, obese but soft, non-tender and non-distended. No HSM noted. No guarding or rebound. No masses appreciated.  Rectal:  Deferred  Musculoskalatal:  Symmetrical without gross deformities. Neurologic:  Alert and oriented x4;  grossly normal neurologically. Psych:  Alert and cooperative. Normal mood and affect. Heme/Lymph/Immune: No excessive bruising noted.    12/24/2017 3:55 PM   Disclaimer: This note was dictated with voice recognition software. Similar sounding words can inadvertently be transcribed and may not be corrected upon review.

## 2017-12-25 ENCOUNTER — Encounter: Payer: Self-pay | Admitting: *Deleted

## 2017-12-25 ENCOUNTER — Telehealth: Payer: Self-pay | Admitting: *Deleted

## 2017-12-25 NOTE — Telephone Encounter (Signed)
GES scheduled for 12/31/17 at 8:00am, arrival time 7:45am, NPO after midnight, no medications the morning.  Pre-op scheduled for 01/09/18 at 12:45pm Called pt and made him aware of appt details. He stopped by the office and I typed up his appt information for him.   No PA is needed for GES.

## 2017-12-25 NOTE — Progress Notes (Signed)
CC'D TO PCP °

## 2017-12-29 ENCOUNTER — Emergency Department (HOSPITAL_COMMUNITY)
Admission: EM | Admit: 2017-12-29 | Discharge: 2017-12-29 | Disposition: A | Discharge status: Home / Self Care | Payer: Medicare HMO | Attending: Emergency Medicine | Admitting: Emergency Medicine

## 2017-12-29 ENCOUNTER — Emergency Department (HOSPITAL_COMMUNITY): Payer: Medicare HMO

## 2017-12-29 ENCOUNTER — Encounter (HOSPITAL_COMMUNITY): Payer: Self-pay | Admitting: *Deleted

## 2017-12-29 DIAGNOSIS — Y929 Unspecified place or not applicable: Secondary | ICD-10-CM | POA: Diagnosis not present

## 2017-12-29 DIAGNOSIS — I10 Essential (primary) hypertension: Secondary | ICD-10-CM | POA: Insufficient documentation

## 2017-12-29 DIAGNOSIS — Y939 Activity, unspecified: Secondary | ICD-10-CM | POA: Insufficient documentation

## 2017-12-29 DIAGNOSIS — Y999 Unspecified external cause status: Secondary | ICD-10-CM | POA: Insufficient documentation

## 2017-12-29 DIAGNOSIS — F17210 Nicotine dependence, cigarettes, uncomplicated: Secondary | ICD-10-CM | POA: Diagnosis not present

## 2017-12-29 DIAGNOSIS — J45909 Unspecified asthma, uncomplicated: Secondary | ICD-10-CM | POA: Insufficient documentation

## 2017-12-29 DIAGNOSIS — Z79899 Other long term (current) drug therapy: Secondary | ICD-10-CM | POA: Diagnosis not present

## 2017-12-29 DIAGNOSIS — S86111A Strain of other muscle(s) and tendon(s) of posterior muscle group at lower leg level, right leg, initial encounter: Secondary | ICD-10-CM | POA: Diagnosis not present

## 2017-12-29 DIAGNOSIS — X501XXA Overexertion from prolonged static or awkward postures, initial encounter: Secondary | ICD-10-CM | POA: Diagnosis not present

## 2017-12-29 DIAGNOSIS — S81821A Laceration with foreign body, right lower leg, initial encounter: Secondary | ICD-10-CM | POA: Diagnosis not present

## 2017-12-29 DIAGNOSIS — S99911A Unspecified injury of right ankle, initial encounter: Secondary | ICD-10-CM | POA: Diagnosis present

## 2017-12-29 DIAGNOSIS — M25571 Pain in right ankle and joints of right foot: Secondary | ICD-10-CM | POA: Diagnosis not present

## 2017-12-29 MED ORDER — OXYCODONE-ACETAMINOPHEN 5-325 MG PO TABS
2.0000 | ORAL_TABLET | Freq: Once | ORAL | Status: AC
Start: 2017-12-29 — End: 2017-12-29
  Administered 2017-12-29: 2 via ORAL
  Filled 2017-12-29: qty 2

## 2017-12-29 NOTE — ED Triage Notes (Signed)
Pt was going down some steps and heard and felt a pop in right calf,  C/o right ankle pain.

## 2017-12-29 NOTE — Discharge Instructions (Signed)
Elevate your leg when possible.  Apply ice packs on/off, no heat.  Wear the ACE wrap as needed for support, but do not wear it continuously.  Call your orthopedic provider to arrange a follow-up appt.

## 2017-12-30 NOTE — ED Provider Notes (Signed)
[facility] EMERGENCY DEPARTMENT Provider Note   CSN: [id number] Arrival date & time: 12/29/17  1521     History   Chief Complaint Chief Complaint  Patient presents with  . Ankle Pain    HPI [name] is a 42 y.o. male.  HPI   [name] is a 42 y.o. male who presents to the Emergency Department complaining of right posterior lower leg pain and ankle pain.  Symptoms began after he was stepping over something on the steps, felt a "pop" and "pulling" sensations to the back of his calf.  Pain associated with flexing the right foot and weight bearing.  Denies fall.  Hx of right ankle surgery with limited ROM of the ankle at baseline.  Denies numbness or the extremity, redness, knee pain.     Past Medical History:  Diagnosis Date  . Anxiety   . Asthma   . Bipolar 1 disorder ([place])   . Chronic back pain   . Complication of anesthesia    Anxiety when he awaken with ET tube still in place  . GERD (gastroesophageal reflux disease)   . Hyperlipidemia   . Hypertension   . Hypoventilation syndrome   . Left testicular torsion    "20 years ago"  . Lumbar radiculopathy   . Migraines   . Schizoaffective disorder ([place])    Day Mark    Patient Active Problem List   Diagnosis Date Noted  . Dysphagia 12/24/2017  . Schizophrenia ([place]) 05/28/2016  . Abdominal pain, epigastric 02/04/2013  . Constipation 12/02/2012  . Abdominal pain 11/22/2012  . INSOMNIA 04/26/2009  . BACK PAIN, LUMBAR 03/20/2009  . BIPOLAR AFFECTIVE DISORDER 02/17/2009  . OTHER ENTHESOPATHY OF ANKLE AND TARSUS 10/05/2008  . GERD 09/16/2008  . PERSONAL HISTORY OF SCHIZOPHRENIA 05/27/2008  . ESOPHAGITIS, REFLUX 03/23/2008  . HIATAL HERNIA 03/23/2008  . HYPERLIPIDEMIA 01/07/2008  . MORBID OBESITY 01/07/2008  . ANXIETY DEPRESSION 01/07/2008  . TOBACCO ABUSE 01/07/2008  . HYPERTENSION 01/07/2008  . ALLERGIC RHINITIS 01/07/2008  . IBS 01/07/2008    Past Surgical History:  Procedure Laterality  Date  . ANKLE SURGERY    . APPENDECTOMY    . CHOLECYSTECTOMY    . ESOPHAGOGASTRODUODENOSCOPY  03/17/2008   RMR: A tiny distal esophageal erosions consistent with mild  erosive reflux esophagitis, otherwise normal esophagus, small hiatal hernia, minimally polypoid antral mucosa with doubtful clinical was significance.  Otherwise, normal stomach, patent pylorus, and normal D1-D2  . ESOPHAGOGASTRODUODENOSCOPY (EGD) WITH PROPOFOL N/A 02/18/2013   Procedure: ESOPHAGOGASTRODUODENOSCOPY (EGD) WITH PROPOFOL;  Surgeon: Daneil Dolin, MD;  Location: [facility];  Service: Endoscopy;  Laterality: N/A;  . NASAL SINUS SURGERY  01/24/2012   Procedure: ENDOSCOPIC SINUS SURGERY;  Surgeon: Izora Gala, MD;  Location: [place];  Service: ENT;  Laterality: Left;  left ethmoidectomy, maxillary, frontal  . UMBILICAL HERNIA REPAIR         Home Medications    Prior to Admission medications   Medication Sig Start Date End Date Taking? Authorizing Provider  ALPRAZolam Duanne Moron) 1 MG tablet Take 1 mg by mouth 4 (four) times daily.     [provider]  ARIPiprazole (ABILIFY) 20 MG tablet Take 20 mg by mouth at bedtime.     [provider]  baclofen (LIORESAL) 20 MG tablet Take 20 mg by mouth 3 (three) times daily. 11/02/17   [provider]  citalopram (CELEXA) 40 MG tablet Take 40 mg by mouth every morning.     [provider]  gabapentin (NEURONTIN) 300 MG capsule Take 1 capsule by mouth 3 (three) times daily. 11/20/17   [provider]  methylphenidate (RITALIN) 20 MG tablet Take 20 mg by mouth daily. 10/28/17   [provider]  pantoprazole (PROTONIX) 40 MG tablet Take 40 mg by mouth at bedtime.     [provider]  sucralfate (CARAFATE) 1 g tablet Take 1 tablet (1 g total) by mouth 4 (four) times daily as needed. 12/24/17   Carlis Stable, NP  temazepam (RESTORIL) 30 MG capsule Take 1 capsule by mouth daily. 11/24/17   [provider]  traMADol (ULTRAM)  50 MG tablet Take 50 mg by mouth 4 (four) times daily.     [provider]    Family History Family History  Problem Relation Age of Onset  . Anesthesia problems Mother   . Colon cancer Maternal Grandfather   . Crohn's disease Maternal Grandfather   . Gastric cancer Paternal Uncle   . Esophageal cancer Neg Hx     Social History Social History   Tobacco Use  . Smoking status: Current Every Day Smoker    Packs/day: 1.00    Years: 22.00    Pack years: 22.00    Types: Cigarettes  . Smokeless tobacco: Never Used  Substance Use Topics  . Alcohol use: Yes    Comment: Rare: 1 beer every 6-7 months  . Drug use: No     Allergies   Compazine; Imitrex [sumatriptan base]; Risperidone and related; and Risperidone   Review of Systems Review of Systems  Constitutional: Negative for chills and fever.  Respiratory: Negative for chest tightness and shortness of breath.   Cardiovascular: Negative for chest pain.  Musculoskeletal: Positive for arthralgias (right ankle pain) and myalgias (right calf pain). Negative for joint swelling.  Skin: Negative for color change and wound.  Neurological: Negative for weakness and numbness.  All other systems reviewed and are negative.    Physical Exam Updated Vital Signs BP (!) 135/94   Pulse 96   Temp 97.6 F (36.4 C) (Oral)   Resp (!) 22   Ht 6' 2.5" (1.892 m)   Wt 136.1 kg (300 lb)   SpO2 96%   BMI 38.00 kg/m   Physical Exam  Constitutional: He appears well-developed and well-nourished. No distress.  HENT:  Head: Normocephalic and atraumatic.  Cardiovascular: Normal rate, regular rhythm and intact distal pulses.  Pulmonary/Chest: Effort normal and breath sounds normal.  Musculoskeletal: He exhibits tenderness. He exhibits no edema.  Mild to moderate edema of the posterior right lower leg.  Tender along the right soleus muscle.  Mildly positive Thompson test.  Achilles tendon appears intact.  No erythema.  DP and PT pulses  intact.   Neurological: He is alert. No sensory deficit. He exhibits normal muscle tone. Coordination normal.  Skin: Skin is warm and dry. Capillary refill takes less than 2 seconds. No erythema.  Psychiatric: He has a normal mood and affect.  Nursing note and vitals reviewed.    ED Treatments / Results  Labs (all labs ordered are listed, but only abnormal results are displayed) Labs Reviewed - No data to display  EKG  EKG Interpretation None       Radiology Dg Ankle Complete Right  Result Date: 12/29/2017 CLINICAL DATA:  Pain EXAM: RIGHT ANKLE - COMPLETE 3+ VIEW COMPARISON:  None. FINDINGS: No fracture or dislocation is seen. The ankle mortise is intact. The base of the fifth metatarsal is unremarkable. Small plantar  calcaneal enthesophyte. IMPRESSION: No fracture or dislocation is seen. Electronically Signed   By: Julian Hy M.D.   On: 12/29/2017 16:14    Procedures Procedures (including critical care time)  Medications Ordered in ED Medications  oxyCODONE-acetaminophen (PERCOCET/ROXICET) 5-325 MG per tablet 2 tablet (2 tablets Oral Given 12/29/17 1809)     Initial Impression / Assessment and Plan / ED Course  I have reviewed the triage vital signs and the nursing notes.  Pertinent labs & imaging results that were available during my care of the patient were reviewed by me and considered in my medical decision making (see chart for details).       Probable muscle tear.  NV intact.   ACE wrap applied and crutches given.  Pt prefers to f/u with [facility].    Pt reviewed on narcotic database, received tramadol few days ago, agrees to take for pain.    Final Clinical Impressions(s) / ED Diagnoses   Final diagnoses:  Gastrocnemius muscle tear, right, initial encounter    ED Discharge Orders    None       Bufford Lope 12/30/17 2111    Nat Christen, MD 12/31/17 0001

## 2017-12-31 ENCOUNTER — Encounter (HOSPITAL_COMMUNITY): Payer: Medicare HMO

## 2018-01-06 DIAGNOSIS — S86311A Strain of muscle(s) and tendon(s) of peroneal muscle group at lower leg level, right leg, initial encounter: Secondary | ICD-10-CM | POA: Diagnosis not present

## 2018-01-06 DIAGNOSIS — M79661 Pain in right lower leg: Secondary | ICD-10-CM | POA: Diagnosis not present

## 2018-01-07 DIAGNOSIS — M545 Low back pain: Secondary | ICD-10-CM | POA: Diagnosis not present

## 2018-01-07 DIAGNOSIS — M5136 Other intervertebral disc degeneration, lumbar region: Secondary | ICD-10-CM | POA: Diagnosis not present

## 2018-01-08 ENCOUNTER — Encounter (HOSPITAL_COMMUNITY): Payer: Self-pay | Admitting: *Deleted

## 2018-01-08 ENCOUNTER — Emergency Department (HOSPITAL_COMMUNITY)
Admission: EM | Admit: 2018-01-08 | Discharge: 2018-01-08 | Disposition: A | Discharge status: Home / Self Care | Payer: Medicare HMO | Attending: Emergency Medicine | Admitting: Emergency Medicine

## 2018-01-08 ENCOUNTER — Other Ambulatory Visit: Payer: Self-pay

## 2018-01-08 ENCOUNTER — Emergency Department (HOSPITAL_COMMUNITY): Payer: Medicare HMO

## 2018-01-08 DIAGNOSIS — Z9049 Acquired absence of other specified parts of digestive tract: Secondary | ICD-10-CM | POA: Diagnosis not present

## 2018-01-08 DIAGNOSIS — S60222A Contusion of left hand, initial encounter: Secondary | ICD-10-CM | POA: Insufficient documentation

## 2018-01-08 DIAGNOSIS — F17210 Nicotine dependence, cigarettes, uncomplicated: Secondary | ICD-10-CM | POA: Diagnosis not present

## 2018-01-08 DIAGNOSIS — F319 Bipolar disorder, unspecified: Secondary | ICD-10-CM | POA: Insufficient documentation

## 2018-01-08 DIAGNOSIS — F419 Anxiety disorder, unspecified: Secondary | ICD-10-CM | POA: Insufficient documentation

## 2018-01-08 DIAGNOSIS — Z79899 Other long term (current) drug therapy: Secondary | ICD-10-CM | POA: Diagnosis not present

## 2018-01-08 DIAGNOSIS — W231XXA Caught, crushed, jammed, or pinched between stationary objects, initial encounter: Secondary | ICD-10-CM | POA: Diagnosis not present

## 2018-01-08 DIAGNOSIS — J45909 Unspecified asthma, uncomplicated: Secondary | ICD-10-CM | POA: Insufficient documentation

## 2018-01-08 DIAGNOSIS — Y998 Other external cause status: Secondary | ICD-10-CM | POA: Diagnosis not present

## 2018-01-08 DIAGNOSIS — I10 Essential (primary) hypertension: Secondary | ICD-10-CM | POA: Diagnosis not present

## 2018-01-08 DIAGNOSIS — F209 Schizophrenia, unspecified: Secondary | ICD-10-CM | POA: Diagnosis not present

## 2018-01-08 DIAGNOSIS — Y9389 Activity, other specified: Secondary | ICD-10-CM | POA: Insufficient documentation

## 2018-01-08 DIAGNOSIS — S6992XA Unspecified injury of left wrist, hand and finger(s), initial encounter: Secondary | ICD-10-CM | POA: Diagnosis not present

## 2018-01-08 DIAGNOSIS — Y92810 Car as the place of occurrence of the external cause: Secondary | ICD-10-CM | POA: Diagnosis not present

## 2018-01-08 DIAGNOSIS — M79642 Pain in left hand: Secondary | ICD-10-CM | POA: Diagnosis not present

## 2018-01-08 MED ORDER — TRAMADOL HCL 50 MG PO TABS: 50.0000 mg | ORAL_TABLET | Freq: Four times a day (QID) | ORAL | 0 refills | Status: DC | PRN

## 2018-01-08 MED ORDER — IBUPROFEN 600 MG PO TABS: 600.0000 mg | ORAL_TABLET | Freq: Four times a day (QID) | ORAL | 0 refills | Status: DC | PRN

## 2018-01-08 MED ORDER — KETOROLAC TROMETHAMINE 30 MG/ML IJ SOLN
30.0000 mg | Freq: Once | INTRAMUSCULAR | Status: AC
  Administered 2018-01-08: 30 mg via INTRAMUSCULAR
  Filled 2018-01-08: qty 1

## 2018-01-08 NOTE — ED Provider Notes (Addendum)
[facility] EMERGENCY DEPARTMENT Provider Note   CSN: [id number] Arrival date & time: 01/08/18  1834     History   Chief Complaint Chief Complaint  Patient presents with  . Hand Injury    HPI [name] is a 42 y.o. male with past medical history as outlined below presenting with left hand pain and swelling after pinning it between a recliner and the metal edge of the mini Lucianne Lei he was trying to move it out of just prior to arrival.  He reports numbness in his ring finger but is able to flex and extend the fingers although pain.  He has had no treatment prior to arrival but is now actively using ice .  The history is provided by the patient.    Past Medical History:  Diagnosis Date  . Anxiety   . Asthma   . Bipolar 1 disorder ([place])   . Chronic back pain   . Complication of anesthesia    Anxiety when he awaken with ET tube still in place  . GERD (gastroesophageal reflux disease)   . Hyperlipidemia   . Hypertension   . Hypoventilation syndrome   . Left testicular torsion    "20 years ago"  . Lumbar radiculopathy   . Migraines   . Schizoaffective disorder ([place])    Day Mark    Patient Active Problem List   Diagnosis Date Noted  . Dysphagia 12/24/2017  . Schizophrenia ([place]) 05/28/2016  . Abdominal pain, epigastric 02/04/2013  . Constipation 12/02/2012  . Abdominal pain 11/22/2012  . INSOMNIA 04/26/2009  . BACK PAIN, LUMBAR 03/20/2009  . BIPOLAR AFFECTIVE DISORDER 02/17/2009  . OTHER ENTHESOPATHY OF ANKLE AND TARSUS 10/05/2008  . GERD 09/16/2008  . PERSONAL HISTORY OF SCHIZOPHRENIA 05/27/2008  . ESOPHAGITIS, REFLUX 03/23/2008  . HIATAL HERNIA 03/23/2008  . HYPERLIPIDEMIA 01/07/2008  . MORBID OBESITY 01/07/2008  . ANXIETY DEPRESSION 01/07/2008  . TOBACCO ABUSE 01/07/2008  . HYPERTENSION 01/07/2008  . ALLERGIC RHINITIS 01/07/2008  . IBS 01/07/2008    Past Surgical History:  Procedure Laterality Date  . ANKLE SURGERY    . APPENDECTOMY    .  CHOLECYSTECTOMY    . ESOPHAGOGASTRODUODENOSCOPY  03/17/2008   RMR: A tiny distal esophageal erosions consistent with mild  erosive reflux esophagitis, otherwise normal esophagus, small hiatal hernia, minimally polypoid antral mucosa with doubtful clinical was significance.  Otherwise, normal stomach, patent pylorus, and normal D1-D2  . ESOPHAGOGASTRODUODENOSCOPY (EGD) WITH PROPOFOL N/A 02/18/2013   Procedure: ESOPHAGOGASTRODUODENOSCOPY (EGD) WITH PROPOFOL;  Surgeon: Daneil Dolin, MD;  Location: [facility];  Service: Endoscopy;  Laterality: N/A;  . NASAL SINUS SURGERY  01/24/2012   Procedure: ENDOSCOPIC SINUS SURGERY;  Surgeon: Izora Gala, MD;  Location: [place];  Service: ENT;  Laterality: Left;  left ethmoidectomy, maxillary, frontal  . UMBILICAL HERNIA REPAIR         Home Medications    Prior to Admission medications   Medication Sig Start Date End Date Taking? Authorizing Provider  ALPRAZolam Duanne Moron) 1 MG tablet Take 1 mg by mouth 4 (four) times daily.     [provider]  ARIPiprazole (ABILIFY) 20 MG tablet Take 20 mg by mouth at bedtime.     [provider]  baclofen (LIORESAL) 20 MG tablet Take 20 mg by mouth 3 (three) times daily. 11/02/17   [provider]  citalopram (CELEXA) 40 MG tablet Take 40 mg by mouth every morning.     [provider]  gabapentin (NEURONTIN) 300 MG capsule  Take 1 capsule by mouth 3 (three) times daily. 11/20/17   [provider]  ibuprofen (ADVIL,MOTRIN) 600 MG tablet Take 1 tablet (600 mg total) by mouth every 6 (six) hours as needed. 01/08/18   Evalee Jefferson, PA-C  methylphenidate (RITALIN) 20 MG tablet Take 20 mg by mouth daily. 10/28/17   [provider]  naproxen sodium (ALEVE) 220 MG tablet Take 220 mg by mouth daily as needed (headache / pain).    [provider]  pantoprazole (PROTONIX) 40 MG tablet Take 40 mg by mouth daily.     [provider]  sucralfate (CARAFATE) 1 g tablet Take 1  tablet (1 g total) by mouth 4 (four) times daily as needed. Patient taking differently: Take 1 g by mouth 3 (three) times daily.  12/24/17   Carlis Stable, NP  temazepam (RESTORIL) 30 MG capsule Take 30 mg by mouth at bedtime.  11/24/17   [provider]  traMADol (ULTRAM) 50 MG tablet Take 1 tablet (50 mg total) by mouth every 6 (six) hours as needed. 01/08/18   Evalee Jefferson, PA-C    Family History Family History  Problem Relation Age of Onset  . Anesthesia problems Mother   . Colon cancer Maternal Grandfather   . Crohn's disease Maternal Grandfather   . Gastric cancer Paternal Uncle   . Esophageal cancer Neg Hx     Social History Social History   Tobacco Use  . Smoking status: Current Every Day Smoker    Packs/day: 1.00    Years: 22.00    Pack years: 22.00    Types: Cigarettes  . Smokeless tobacco: Never Used  Substance Use Topics  . Alcohol use: Yes    Comment: Rare: 1 beer every 6-7 months  . Drug use: No     Allergies   Compazine; Imitrex [sumatriptan base]; Risperidone and related; and Risperidone   Review of Systems Review of Systems  Constitutional: Negative for fever.  Musculoskeletal: Positive for arthralgias. Negative for joint swelling and myalgias.  Skin: Positive for color change.  Neurological: Negative for weakness and numbness.     Physical Exam Updated Vital Signs BP 124/73   Pulse (!) 109   Temp 99 F (37.2 C) (Oral)   Resp (!) 24   Ht 6' 2.5" (1.892 m)   Wt 136.1 kg (300 lb)   SpO2 96%   BMI 38.00 kg/m   Physical Exam  Constitutional: He appears well-developed and well-nourished.  HENT:  Head: Atraumatic.  Neck: Normal range of motion.  Cardiovascular:  Pulses equal bilaterally  Musculoskeletal: He exhibits edema and tenderness. He exhibits no deformity.       Left hand: He exhibits swelling. Decreased sensation noted.  Decreased sensation entire ring finger with less than 2 sec cap refill in fingertip. He can flex/ext the  finger but limited by pain. Moderate sized hematoma dorsal hand at base of long and ring fingers.  Neurological: He is alert. He has normal strength. He displays normal reflexes. No sensory deficit.  Skin: Skin is warm and dry.  Psychiatric: He has a normal mood and affect.     ED Treatments / Results  Labs (all labs ordered are listed, but only abnormal results are displayed) Labs Reviewed - No data to display  EKG  EKG Interpretation None       Radiology Dg Hand Complete Left  Result Date: 01/08/2018 CLINICAL DATA:  Hand slammed between car and couch today. Pain over third-fourth metacarpals. EXAM: LEFT HAND -  COMPLETE 3+ VIEW COMPARISON:  None. FINDINGS: There is no evidence of fracture or dislocation. There is no evidence of arthropathy or other focal bone abnormality. Soft tissues are unremarkable. IMPRESSION: Negative. Electronically Signed   By: Marin Olp M.D.   On: 01/08/2018 19:02    Procedures Procedures (including critical care time)  Medications Ordered in ED Medications  ketorolac (TORADOL) 30 MG/ML injection 30 mg (not administered)     Initial Impression / Assessment and Plan / ED Course  I have reviewed the triage vital signs and the nursing notes.  Pertinent labs & imaging results that were available during my care of the patient were reviewed by me and considered in my medical decision making (see chart for details).     Advised ice, elevation, ace wraps.  Plan recheck ortho if not improving.  Expect the numbness should improve with swelling reduction. He has appt with [facility] in 1 week for ankle injury eval. Tramadol, ibuprofen prescribed.    [place] controlled substance database reviewed. Pt last received tramadol by pcp 7 day supply on 12/22/17.  Final Clinical Impressions(s) / ED Diagnoses   Final diagnoses:  Contusion of left hand, initial encounter    ED Discharge Orders        Ordered    ibuprofen (ADVIL,MOTRIN) 600 MG tablet  Every 6  hours PRN     01/08/18 1935    traMADol (ULTRAM) 50 MG tablet  Every 6 hours PRN     01/08/18 1935       Evalee Jefferson, Hershal Coria 01/08/18 1936    Evalee Jefferson, PA-C 01/08/18 1936    Fredia Sorrow, MD 01/09/18 [id number]

## 2018-01-08 NOTE — ED Notes (Signed)
Patient transported to X-ray 

## 2018-01-08 NOTE — ED Triage Notes (Addendum)
Pt was moving a recliner sectional into a minivan. Pt reports he got his hand jammed in between the [place] door and the couch. Pt has swelling to left hand.   Pt had black ring to left ring finger and he was able to remove it.

## 2018-01-09 ENCOUNTER — Encounter (HOSPITAL_COMMUNITY): Payer: Self-pay

## 2018-01-09 ENCOUNTER — Encounter (HOSPITAL_COMMUNITY)
Admission: RE | Admit: 2018-01-09 | Discharge: 2018-01-09 | Disposition: A | Discharge status: Home / Self Care | Payer: Medicare HMO | Source: Ambulatory Visit | Attending: Internal Medicine | Admitting: Internal Medicine

## 2018-01-15 ENCOUNTER — Ambulatory Visit (HOSPITAL_COMMUNITY): Payer: Medicare HMO | Admitting: Anesthesiology

## 2018-01-15 ENCOUNTER — Encounter (HOSPITAL_COMMUNITY)
Admission: RE | Disposition: A | Discharge status: Home / Self Care | Payer: Self-pay | Source: Ambulatory Visit | Attending: Internal Medicine

## 2018-01-15 ENCOUNTER — Encounter (HOSPITAL_COMMUNITY): Payer: Self-pay | Admitting: *Deleted

## 2018-01-15 ENCOUNTER — Ambulatory Visit (HOSPITAL_COMMUNITY)
Admission: RE | Admit: 2018-01-15 | Discharge: 2018-01-15 | Disposition: A | Discharge status: Home / Self Care | Payer: Medicare HMO | Source: Ambulatory Visit | Attending: Internal Medicine | Admitting: Internal Medicine

## 2018-01-15 DIAGNOSIS — F319 Bipolar disorder, unspecified: Secondary | ICD-10-CM | POA: Diagnosis not present

## 2018-01-15 DIAGNOSIS — R1310 Dysphagia, unspecified: Secondary | ICD-10-CM

## 2018-01-15 DIAGNOSIS — F259 Schizoaffective disorder, unspecified: Secondary | ICD-10-CM | POA: Diagnosis not present

## 2018-01-15 DIAGNOSIS — K259 Gastric ulcer, unspecified as acute or chronic, without hemorrhage or perforation: Secondary | ICD-10-CM | POA: Insufficient documentation

## 2018-01-15 DIAGNOSIS — F17210 Nicotine dependence, cigarettes, uncomplicated: Secondary | ICD-10-CM | POA: Insufficient documentation

## 2018-01-15 DIAGNOSIS — K3189 Other diseases of stomach and duodenum: Secondary | ICD-10-CM | POA: Diagnosis not present

## 2018-01-15 DIAGNOSIS — R1319 Other dysphagia: Secondary | ICD-10-CM

## 2018-01-15 DIAGNOSIS — Z79899 Other long term (current) drug therapy: Secondary | ICD-10-CM | POA: Insufficient documentation

## 2018-01-15 DIAGNOSIS — I10 Essential (primary) hypertension: Secondary | ICD-10-CM | POA: Insufficient documentation

## 2018-01-15 DIAGNOSIS — K219 Gastro-esophageal reflux disease without esophagitis: Secondary | ICD-10-CM | POA: Insufficient documentation

## 2018-01-15 DIAGNOSIS — K222 Esophageal obstruction: Secondary | ICD-10-CM | POA: Diagnosis not present

## 2018-01-15 DIAGNOSIS — E785 Hyperlipidemia, unspecified: Secondary | ICD-10-CM | POA: Insufficient documentation

## 2018-01-15 DIAGNOSIS — F419 Anxiety disorder, unspecified: Secondary | ICD-10-CM | POA: Diagnosis not present

## 2018-01-15 HISTORY — PX: MALONEY DILATION: SHX5535

## 2018-01-15 HISTORY — PX: BIOPSY: SHX5522

## 2018-01-15 HISTORY — PX: ESOPHAGOGASTRODUODENOSCOPY (EGD) WITH PROPOFOL: SHX5813

## 2018-01-15 SURGERY — ESOPHAGOGASTRODUODENOSCOPY (EGD) WITH PROPOFOL
Anesthesia: Monitor Anesthesia Care

## 2018-01-15 MED ORDER — GLYCOPYRROLATE 0.2 MG/ML IJ SOLN
INTRAMUSCULAR | Status: AC
  Filled 2018-01-15: qty 1

## 2018-01-15 MED ORDER — LACTATED RINGERS IV SOLN
INTRAVENOUS | Status: DC
  Administered 2018-01-15 (×3): via INTRAVENOUS

## 2018-01-15 MED ORDER — MIDAZOLAM HCL 2 MG/2ML IJ SOLN
INTRAMUSCULAR | Status: AC
  Filled 2018-01-15: qty 2

## 2018-01-15 MED ORDER — PROPOFOL 10 MG/ML IV BOLUS
INTRAVENOUS | Status: AC
  Filled 2018-01-15: qty 20

## 2018-01-15 MED ORDER — CHLORHEXIDINE GLUCONATE CLOTH 2 % EX PADS: 6.0000 | MEDICATED_PAD | Freq: Once | CUTANEOUS | Status: DC

## 2018-01-15 MED ORDER — GLYCOPYRROLATE 0.2 MG/ML IJ SOLN
0.2000 mg | Freq: Once | INTRAMUSCULAR | Status: AC | PRN
  Administered 2018-01-15: 0.2 mg via INTRAVENOUS

## 2018-01-15 MED ORDER — PROPOFOL 10 MG/ML IV BOLUS
INTRAVENOUS | Status: AC
  Filled 2018-01-15: qty 40

## 2018-01-15 MED ORDER — MIDAZOLAM HCL 2 MG/2ML IJ SOLN
1.0000 mg | INTRAMUSCULAR | Status: AC | PRN
  Administered 2018-01-15 (×2): 2 mg via INTRAVENOUS
  Filled 2018-01-15: qty 2

## 2018-01-15 MED ORDER — LIDOCAINE HCL (PF) 1 % IJ SOLN
INTRAMUSCULAR | Status: AC
  Filled 2018-01-15: qty 5

## 2018-01-15 MED ORDER — PROPOFOL 500 MG/50ML IV EMUL
INTRAVENOUS | Status: DC | PRN
  Administered 2018-01-15 (×2): via INTRAVENOUS
  Administered 2018-01-15: 150 ug/kg/min via INTRAVENOUS

## 2018-01-15 MED ORDER — LIDOCAINE VISCOUS 2 % MT SOLN
OROMUCOSAL | Status: AC
  Filled 2018-01-15: qty 15

## 2018-01-15 MED ORDER — LIDOCAINE VISCOUS 2 % MT SOLN
OROMUCOSAL | Status: DC | PRN
  Administered 2018-01-15: 6 mL via OROMUCOSAL

## 2018-01-15 MED ORDER — FENTANYL CITRATE (PF) 100 MCG/2ML IJ SOLN
INTRAMUSCULAR | Status: AC
  Filled 2018-01-15: qty 2

## 2018-01-15 MED ORDER — LIDOCAINE VISCOUS 2 % MT SOLN
15.0000 mL | Freq: Once | OROMUCOSAL | Status: AC
  Administered 2018-01-15: 6 mL via OROMUCOSAL

## 2018-01-15 MED ORDER — MIDAZOLAM HCL 5 MG/5ML IJ SOLN
INTRAMUSCULAR | Status: DC | PRN
  Administered 2018-01-15 (×2): 2 mg via INTRAVENOUS

## 2018-01-15 MED ORDER — FENTANYL CITRATE (PF) 100 MCG/2ML IJ SOLN
25.0000 ug | INTRAMUSCULAR | Status: DC | PRN
  Administered 2018-01-15: 25 ug via INTRAVENOUS

## 2018-01-15 MED ORDER — FENTANYL CITRATE (PF) 100 MCG/2ML IJ SOLN
25.0000 ug | Freq: Once | INTRAMUSCULAR | Status: AC
  Administered 2018-01-15: 25 ug via INTRAVENOUS

## 2018-01-15 NOTE — Anesthesia Postprocedure Evaluation (Signed)
Anesthesia Post Note  Patient: MAINOR HELLMANN  Procedure(s) Performed: ESOPHAGOGASTRODUODENOSCOPY (EGD) WITH PROPOFOL (N/A ) MALONEY DILATION (N/A )  Patient location during evaluation: PACU Anesthesia Type: MAC Level of consciousness: awake and patient cooperative Pain management: pain level controlled Vital Signs Assessment: post-procedure vital signs reviewed and stable Respiratory status: spontaneous breathing, nonlabored ventilation and respiratory function stable Cardiovascular status: blood pressure returned to baseline Postop Assessment: no apparent nausea or vomiting Anesthetic complications: no     Last Vitals:  Vitals:   01/15/18 1300 01/15/18 1426  BP: 116/62 113/67  Pulse:  100  Resp: 20 (!) 24  Temp:  (!) 36.3 C  SpO2: 94% 95%    Last Pain:  Vitals:   01/15/18 1012  TempSrc: Oral  PainSc: 8                  Tarvaris Puglia J

## 2018-01-15 NOTE — Discharge Instructions (Addendum)
PATIENT INSTRUCTIONS POST-ANESTHESIA  IMMEDIATELY FOLLOWING SURGERY:  Do not drive or operate machinery for the first twenty four hours after surgery.  Do not make any important decisions for twenty four hours after surgery or while taking narcotic pain medications or sedatives.  If you develop intractable nausea and vomiting or a severe headache please notify your doctor immediately.  FOLLOW-UP:  Please make an appointment with your surgeon as instructed. You do not need to follow up with anesthesia unless specifically instructed to do so.  WOUND CARE INSTRUCTIONS (if applicable):  Keep a dry clean dressing on the anesthesia/puncture wound site if there is drainage.  Once the wound has quit draining you may leave it open to air.  Generally you should leave the bandage intact for twenty four hours unless there is drainage.  If the epidural site drains for more than 36-48 hours please call the anesthesia department.  QUESTIONS?:  Please feel free to call your physician or the hospital operator if you have any questions, and they will be happy to assist you.        EGD Discharge instructions Please read the instructions outlined below and refer to this sheet in the next few weeks. These discharge instructions provide you with general information on caring for yourself after you leave the hospital. Your doctor may also give you specific instructions. While your treatment has been planned according to the most current medical practices available, unavoidable complications occasionally occur. If you have any problems or questions after discharge, please call your doctor. ACTIVITY  You may resume your regular activity but move at a slower pace for the next 24 hours.   Take frequent rest periods for the next 24 hours.   Walking will help expel (get rid of) the air and reduce the bloated feeling in your abdomen.   No driving for 24 hours (because of the anesthesia (medicine) used during the test).    You may shower.   Do not sign any important legal documents or operate any machinery for 24 hours (because of the anesthesia used during the test).  NUTRITION  Drink plenty of fluids.   You may resume your normal diet.   Begin with a light meal and progress to your normal diet.   Avoid alcoholic beverages for 24 hours or as instructed by your caregiver.  MEDICATIONS  You may resume your normal medications unless your caregiver tells you otherwise.  WHAT YOU CAN EXPECT TODAY  You may experience abdominal discomfort such as a feeling of fullness or gas pains.  FOLLOW-UP  Your doctor will discuss the results of your test with you.  SEEK IMMEDIATE MEDICAL ATTENTION IF ANY OF THE FOLLOWING OCCUR:  Excessive nausea (feeling sick to your stomach) and/or vomiting.   Severe abdominal pain and distention (swelling).   Trouble swallowing.   Temperature over 101 F (37.8 C).   Rectal bleeding or vomiting of blood.    GERD information provided  Continue Protonix 40 mg twice daily  Continue Carafate as previously directed  Office visit with [place] in 12 weeks  Further recommendations to follow pending review of pathology report   Gastroesophageal Reflux Disease, Adult Normally, food travels down the esophagus and stays in the stomach to be digested. If a person has gastroesophageal reflux disease (GERD), food and stomach acid move back up into the esophagus. When this happens, the esophagus becomes sore and swollen (inflamed). Over time, GERD can make small holes (ulcers) in the lining of the esophagus. Follow these  instructions at home: Diet  Follow a diet as told by your doctor. You may need to avoid foods and drinks such as: ? Coffee and tea (with or without caffeine). ? Drinks that contain alcohol. ? Energy drinks and sports drinks. ? Carbonated drinks or sodas. ? Chocolate and cocoa. ? Peppermint and mint flavorings. ? Garlic and onions. ? Horseradish. ? Spicy and  acidic foods, such as peppers, chili powder, curry powder, vinegar, hot sauces, and BBQ sauce. ? Citrus fruit juices and citrus fruits, such as oranges, lemons, and limes. ? Tomato-based foods, such as red sauce, chili, salsa, and pizza with red sauce. ? Fried and fatty foods, such as donuts, french fries, potato chips, and high-fat dressings. ? High-fat meats, such as hot dogs, rib eye steak, sausage, ham, and bacon. ? High-fat dairy items, such as whole milk, butter, and cream cheese.  Eat small meals often. Avoid eating large meals.  Avoid drinking large amounts of liquid with your meals.  Avoid eating meals during the 2-3 hours before bedtime.  Avoid lying down right after you eat.  Do not exercise right after you eat. General instructions  Pay attention to any changes in your symptoms.  Take over-the-counter and prescription medicines only as told by your doctor. Do not take aspirin, ibuprofen, or other NSAIDs unless your doctor says it is okay.  Do not use any tobacco products, including cigarettes, chewing tobacco, and e-cigarettes. If you need help quitting, ask your doctor.  Wear loose clothes. Do not wear anything tight around your waist.  Raise (elevate) the head of your bed about 6 inches (15 cm).  Try to lower your stress. If you need help doing this, ask your doctor.  If you are overweight, lose an amount of weight that is healthy for you. Ask your doctor about a safe weight loss goal.  Keep all follow-up visits as told by your doctor. This is important. Contact a doctor if:  You have new symptoms.  You lose weight and you do not know why it is happening.  You have trouble swallowing, or it hurts to swallow.  You have wheezing or a cough that keeps happening.  Your symptoms do not get better with treatment.  You have a hoarse voice. Get help right away if:  You have pain in your arms, neck, jaw, teeth, or back.  You feel sweaty, dizzy, or  light-headed.  You have chest pain or shortness of breath.  You throw up (vomit) and your throw up looks like blood or coffee grounds.  You pass out (faint).  Your poop (stool) is bloody or black.  You cannot swallow, drink, or eat. This information is not intended to replace advice given to you by your health care provider. Make sure you discuss any questions you have with your health care provider.

## 2018-01-15 NOTE — Anesthesia Preprocedure Evaluation (Signed)
Anesthesia Evaluation  Patient identified by MRN, date of birth, ID band Patient awake    Reviewed: Allergy & Precautions, NPO status , Patient's Chart, lab work & pertinent test results  History of Anesthesia Complications (+) history of anesthetic complications ( Anxiety when he awaken with ET tube still in place)  Airway Mallampati: I  TM Distance: >3 FB Neck ROM: Full    Dental  (+) Edentulous Upper, Edentulous Lower   Pulmonary asthma , Current Smoker,    breath sounds clear to auscultation       Cardiovascular hypertension, Pt. on medications  Rhythm:Regular Rate:Normal     Neuro/Psych  Headaches, PSYCHIATRIC DISORDERS Anxiety Depression Bipolar Disorder Schizophrenia  Neuromuscular disease    GI/Hepatic GERD  Medicated,  Endo/Other    Renal/GU      Musculoskeletal   Abdominal   Peds  Hematology   Anesthesia Other Findings   Reproductive/Obstetrics                             Anesthesia Physical Anesthesia Plan  ASA: III  Anesthesia Plan: MAC   Post-op Pain Management:    Induction: Intravenous  PONV Risk Score and Plan:   Airway Management Planned: Simple Face Mask  Additional Equipment:   Intra-op Plan:   Post-operative Plan:   Informed Consent: I have reviewed the patients History and Physical, chart, labs and discussed the procedure including the risks, benefits and alternatives for the proposed anesthesia with the patient or authorized representative who has indicated his/her understanding and acceptance.     Plan Discussed with:   Anesthesia Plan Comments:         Anesthesia Quick Evaluation

## 2018-01-15 NOTE — Transfer of Care (Signed)
Immediate Anesthesia Transfer of Care Note  Patient: [name]  Procedure(s) Performed: ESOPHAGOGASTRODUODENOSCOPY (EGD) WITH PROPOFOL (N/A ) MALONEY DILATION (N/A )  Patient Location: PACU  Anesthesia Type:MAC  Level of Consciousness: awake and patient cooperative  Airway & Oxygen Therapy: Patient Spontanous Breathing  Post-op Assessment: Report given to RN and Post -op Vital signs reviewed and stable  Post vital signs: Reviewed and stable  Last Vitals:  Vitals:   01/15/18 1200 01/15/18 1300  BP: 127/70 116/62  Resp: 17 20  Temp:    SpO2: 92% 94%    Last Pain:  Vitals:   01/15/18 1012  TempSrc: Oral  PainSc: 8          Complications: No apparent anesthesia complications

## 2018-01-15 NOTE — Addendum Note (Signed)
Addendum  created 01/15/18 1457 by Charmaine Downs, CRNA   Intraprocedure Staff edited

## 2018-01-15 NOTE — Interval H&P Note (Signed)
History and Physical Interval Note:  01/15/2018 1:40 PM  Doran Stabler  has presented today for surgery, with the diagnosis of GERD/DYSPHAGIA  The various methods of treatment have been discussed with the patient and family. After consideration of risks, benefits and other options for treatment, the patient has consented to  Procedure(s) with comments: ESOPHAGOGASTRODUODENOSCOPY (EGD) WITH PROPOFOL (N/A) - 2:15pm MALONEY DILATION (N/A) as a surgical intervention .  The patient's history has been reviewed, patient examined, no change in status, stable for surgery.  I have reviewed the patient's chart and labs.  Questions were answered to the patient's satisfaction.     Letticia Bhattacharyya  No change. Carafate has has helped considerably. Still with dysphagia. EGD with ED per plan.  The risks, benefits, limitations, alternatives and imponderables have been reviewed with the patient. Potential for esophageal dilation, biopsy, etc. have also been reviewed.  Questions have been answered. All parties agreeable.

## 2018-01-15 NOTE — Op Note (Signed)
[facility] Patient Name: [name] Procedure Date: 01/15/2018 1:29 PM MRN: [id number] Date of Birth: [birth year] Attending MD: Norvel Richards , MD CSN: [id number] Age: 42 Admit Type: Outpatient Procedure:                Upper GI endoscopy Indications:              Dysphagia, ; reflux Providers:                Norvel Richards, MD, Lurline Del, RN, Aram Candela Referring MD:              Medicines:                Propofol per Anesthesia Complications:            No immediate complications. Estimated Blood Loss:     Estimated blood loss was minimal. Procedure:                Pre-Anesthesia Assessment:                           - Prior to the procedure, a History and Physical                            was performed, and patient medications and                            allergies were reviewed. The patient's tolerance of                            previous anesthesia was also reviewed. The risks                            and benefits of the procedure and the sedation                            options and risks were discussed with the patient.                            All questions were answered, and informed consent                            was obtained. Prior Anticoagulants: The patient has                            taken no previous anticoagulant or antiplatelet                            agents. ASA Grade Assessment: II - A patient with                            mild systemic disease. After reviewing the risks  and benefits, the patient was deemed in                            satisfactory condition to undergo the procedure.                           After obtaining informed consent, the endoscope was                            passed under direct vision. Throughout the                            procedure, the patient's blood pressure, pulse, and                            oxygen saturations were  monitored continuously. The                            [id number] ([id number]) scope was introduced through                            the and advanced to the second part of duodenum.                            The upper GI endoscopy was accomplished without                            difficulty. The patient tolerated the procedure                            well. Scope In: 2:09:01 PM Scope Out: 2:20:39 PM Total Procedure Duration: 0 hours 11 minutes 38 seconds  Findings:      The examined esophagus was normal. ined following endoscope reinsertion       and showed no change. Estimated blood loss: none.      A few erosions were found in the gastric antrum. There were multiple 4       mm cratered ulcer versus well.No appearance infiltrating process.      The duodenal bulb and second portion of the duodenum were normal. The       scope was withdrawn. Dilation was performed with a Maloney dilator with       no resistance at 56 Fr. The scope was withdrawn. Dilation was performed       with a Maloney dilator with mild resistance at 30 Fr. The dilation site       was examined following endoscope reinsertion and showed no change.       Estimated blood loss: none. Impression:               - Normal esophagus. Status post Maloney dilation.                           - Erosive gastropathy. gastric ulcer. Status post                            biopsy.                           -  Normal duodenal bulb and second portion of the                            duodenum.                           - . Moderate Sedation:      Moderate (conscious) sedation was personally administered by an       anesthesia professional. The following parameters were monitored: oxygen       saturation, heart rate, blood pressure, and response to care. Total       physician intraservice time was 24 minutes. Recommendation:           - Patient has a contact number available for                            emergencies. The signs and  symptoms of potential                            delayed complications were discussed with the                            patient. Return to normal activities tomorrow.                            Written discharge instructions were provided to the                            patient.                           - Advance diet as tolerated.                           - Continue present medications. Procedure Code(s):        --- Professional ---                           [phone], Esophagogastroduodenoscopy, flexible,                            transoral; diagnostic, including collection of                            specimen(s) by brushing or washing, when performed                            (separate procedure)                           43450, Dilation of esophagus, by unguided sound or                            bougie, single or multiple passes Diagnosis Code(s):        --- Professional ---  K31.89, Other diseases of stomach and duodenum                           R13.10, Dysphagia, unspecified CPT copyright 2016 [employer]. All rights reserved. The codes documented in this report are preliminary and upon coder review may  be revised to meet current compliance requirements. Cristopher Estimable. Rourk, MD Norvel Richards, MD 01/15/2018 2:34:49 PM This report has been signed electronically. Number of Addenda: 0

## 2018-01-19 ENCOUNTER — Other Ambulatory Visit (HOSPITAL_COMMUNITY): Payer: Medicare HMO

## 2018-01-19 DIAGNOSIS — M545 Low back pain: Secondary | ICD-10-CM | POA: Diagnosis not present

## 2018-01-19 DIAGNOSIS — M5136 Other intervertebral disc degeneration, lumbar region: Secondary | ICD-10-CM | POA: Diagnosis not present

## 2018-01-19 DIAGNOSIS — M5416 Radiculopathy, lumbar region: Secondary | ICD-10-CM | POA: Diagnosis not present

## 2018-01-20 ENCOUNTER — Encounter (HOSPITAL_COMMUNITY): Payer: Self-pay | Admitting: Internal Medicine

## 2018-01-22 DIAGNOSIS — J449 Chronic obstructive pulmonary disease, unspecified: Secondary | ICD-10-CM | POA: Diagnosis not present

## 2018-01-22 DIAGNOSIS — Z79891 Long term (current) use of opiate analgesic: Secondary | ICD-10-CM | POA: Diagnosis not present

## 2018-01-24 ENCOUNTER — Encounter: Payer: Self-pay | Admitting: Internal Medicine

## 2018-01-30 ENCOUNTER — Other Ambulatory Visit: Payer: Self-pay

## 2018-01-30 ENCOUNTER — Emergency Department (HOSPITAL_COMMUNITY): Payer: Medicare HMO

## 2018-01-30 ENCOUNTER — Emergency Department (HOSPITAL_COMMUNITY)
Admission: EM | Admit: 2018-01-30 | Discharge: 2018-01-30 | Discharge status: Against Medical Advice | Payer: Medicare HMO | Attending: Emergency Medicine | Admitting: Emergency Medicine

## 2018-01-30 ENCOUNTER — Encounter (HOSPITAL_COMMUNITY): Payer: Self-pay | Admitting: Emergency Medicine

## 2018-01-30 DIAGNOSIS — R200 Anesthesia of skin: Secondary | ICD-10-CM | POA: Diagnosis not present

## 2018-01-30 DIAGNOSIS — R1013 Epigastric pain: Secondary | ICD-10-CM | POA: Diagnosis not present

## 2018-01-30 DIAGNOSIS — G43011 Migraine without aura, intractable, with status migrainosus: Secondary | ICD-10-CM

## 2018-01-30 DIAGNOSIS — Z79899 Other long term (current) drug therapy: Secondary | ICD-10-CM | POA: Insufficient documentation

## 2018-01-30 DIAGNOSIS — R51 Headache: Secondary | ICD-10-CM | POA: Diagnosis not present

## 2018-01-30 DIAGNOSIS — F17210 Nicotine dependence, cigarettes, uncomplicated: Secondary | ICD-10-CM | POA: Diagnosis not present

## 2018-01-30 DIAGNOSIS — J45909 Unspecified asthma, uncomplicated: Secondary | ICD-10-CM | POA: Insufficient documentation

## 2018-01-30 DIAGNOSIS — I10 Essential (primary) hypertension: Secondary | ICD-10-CM | POA: Insufficient documentation

## 2018-01-30 DIAGNOSIS — G43119 Migraine with aura, intractable, without status migrainosus: Secondary | ICD-10-CM | POA: Diagnosis not present

## 2018-01-30 LAB — COMPREHENSIVE METABOLIC PANEL
ALBUMIN: 4 g/dL (ref 3.5–5.0)
ALT: 19 U/L (ref 17–63)
ANION GAP: 10 (ref 5–15)
AST: 20 U/L (ref 15–41)
Alkaline Phosphatase: 103 U/L (ref 38–126)
BUN: 15 mg/dL (ref 6–20)
CHLORIDE: 107 mmol/L (ref 101–111)
CO2: 22 mmol/L (ref 22–32)
Calcium: 8.9 mg/dL (ref 8.9–10.3)
Creatinine, Ser: 0.83 mg/dL (ref 0.61–1.24)
GFR calc Af Amer: >60 mL/min (ref >60)
GLUCOSE: 111 mg/dL — AB (ref 65–99)
POTASSIUM: 3.6 mmol/L (ref 3.5–5.1)
Sodium: 139 mmol/L (ref 135–145)
TOTAL PROTEIN: 6.9 g/dL (ref 6.5–8.1)
Total Bilirubin: 0.6 mg/dL (ref 0.3–1.2)

## 2018-01-30 LAB — CBC
HCT: 44.5 % (ref 39.0–52.0)
Hemoglobin: 15.1 g/dL (ref 13.0–17.0)
MCH: 30.6 pg (ref 26.0–34.0)
MCHC: 33.9 g/dL (ref 30.0–36.0)
MCV: 90.1 fL (ref 78.0–100.0)
Platelets: 208 10*3/uL (ref 150–400)
RBC: 4.94 MIL/uL (ref 4.22–5.81)
RDW: 14.4 % (ref 11.5–15.5)
WBC: 9.6 10*3/uL (ref 4.0–10.5)

## 2018-01-30 LAB — I-STAT CHEM 8, ED
BUN: 13 mg/dL (ref 6–20)
CREATININE: 0.8 mg/dL (ref 0.61–1.24)
Calcium, Ion: 1.17 mmol/L (ref 1.15–1.40)
Chloride: 107 mmol/L (ref 101–111)
Glucose, Bld: 111 mg/dL — ABNORMAL HIGH (ref 65–99)
HEMATOCRIT: 44 % (ref 39.0–52.0)
HEMOGLOBIN: 15 g/dL (ref 13.0–17.0)
POTASSIUM: 3.7 mmol/L (ref 3.5–5.1)
SODIUM: 143 mmol/L (ref 135–145)
TCO2: 22 mmol/L (ref 22–32)

## 2018-01-30 LAB — PROTIME-INR
INR: 1.03
PROTHROMBIN TIME: 13.4 s (ref 11.4–15.2)

## 2018-01-30 LAB — DIFFERENTIAL
BASOS ABS: 0 10*3/uL (ref 0.0–0.1)
BASOS PCT: 0 %
EOS ABS: 0.2 10*3/uL (ref 0.0–0.7)
EOS PCT: 2 %
Lymphocytes Relative: 22 %
Lymphs Abs: 2.1 10*3/uL (ref 0.7–4.0)
Monocytes Absolute: 0.9 10*3/uL (ref 0.1–1.0)
Monocytes Relative: 9 %
NEUTROS PCT: 67 %
Neutro Abs: 6.4 10*3/uL (ref 1.7–7.7)

## 2018-01-30 LAB — ETHANOL

## 2018-01-30 LAB — APTT: APTT: 34 s (ref 24–36)

## 2018-01-30 LAB — TROPONIN I

## 2018-01-30 MED ORDER — IOPAMIDOL (ISOVUE-370) INJECTION 76%
150.0000 mL | Freq: Once | INTRAVENOUS | Status: AC | PRN
  Administered 2018-01-30: 150 mL via INTRAVENOUS

## 2018-01-30 MED ORDER — DEXAMETHASONE SODIUM PHOSPHATE 10 MG/ML IJ SOLN
10.0000 mg | Freq: Once | INTRAMUSCULAR | Status: AC
  Administered 2018-01-30: 10 mg via INTRAVENOUS
  Filled 2018-01-30: qty 1

## 2018-01-30 MED ORDER — METOCLOPRAMIDE HCL 5 MG/ML IJ SOLN
10.0000 mg | Freq: Once | INTRAMUSCULAR | Status: AC
  Administered 2018-01-30: 10 mg via INTRAVENOUS
  Filled 2018-01-30: qty 2

## 2018-01-30 MED ORDER — DIPHENHYDRAMINE HCL 50 MG/ML IJ SOLN
25.0000 mg | Freq: Once | INTRAMUSCULAR | Status: AC
  Administered 2018-01-30: 25 mg via INTRAVENOUS
  Filled 2018-01-30: qty 1

## 2018-01-30 MED ORDER — MAGNESIUM SULFATE 2 GM/50ML IV SOLN
2.0000 g | Freq: Once | INTRAVENOUS | Status: AC
  Administered 2018-01-30: 2 g via INTRAVENOUS
  Filled 2018-01-30: qty 50

## 2018-01-30 NOTE — Consult Note (Signed)
   [facility] Tele[facility]urology Consult Services  Date of service: 01/30/18  Impression:  acute onset right face, arm, and leg numbness and questionable right VF cut - recommend CTA head/neck and perfusion. Complicated migraine vs small stroke. - - -   Not a tpa candidate due to: hx of intracranial hemorrhage. Presentation is not suggestive of Large Vessel Occlusive Disease. Thrombectomy would not be recommended.   Differential Diagnosis:   1. Cardioembolic stroke  2. Small vessel disease/lacune  3. Thromboembolic, artery-to-artery mechanism  4. Hypercoagulable state-related infarct  5. Transient ischemic attack  6. Thrombotic mechanism, large artery disease   Comments:   Door time: 0050 [facility] contacted: [phone] [facility] at bedside: 0148 NIHSS assessment time: 0159  Recommendations:  CTA/P head/neck ASA if CT head is neg for hemorrhage DVT proph - lovenox permissive htn PT/OT/speech bedside swallow eval  Inpatient neurology consultation Inpatient stroke evaluation as per Neurology/ Internal Medicine Discussed with ED MD Please call with questions  -----------------------------------------------------------------------------------------  CC right sided numbness/tingling.   History of Present Illness   Patient is a 42 yo man with acute onset right face, arm, and leg tingling starting at approx 2230. No LOC/convulsion. No trouble speaking/swallowing. No hx of stroke/MI. No blood thinners. He has had a traumatic intracranial hemorrhage in the past.  Diagnostic: CT head neg for hemorrhage.  Exam:  RESULT SUMMARY: 4 points NIH Stroke Scale   INPUTS: 1A: Level of consciousness -> 0 = Alert; keenly responsive 1B: Ask month and age -> 0 = Both questions right 1C: 'Blink eyes' & 'squeeze hands' -> 0 = Performs both tasks 2: Horizontal extraocular movements -> 2 = Forced gaze palsy: cannot be overcome 3: Visual fields -> 0 = No visual loss 4: Facial  palsy -> 0 = Normal symmetry 5A: Left arm motor drift -> 0 = No drift for 10 seconds 5B: Right arm motor drift -> 0 = No drift for 10 seconds 6A: Left leg motor drift -> 0 = No drift for 5 seconds 6B: Right leg motor drift -> 0 = No drift for 5 seconds 7: Limb Ataxia -> 0 = No ataxia 8: Sensation -> 2 = Complete loss: cannot sense being touched at all 9: Language/aphasia -> 0 = Normal; no aphasia 10: Dysarthria -> 0 = Normal 11: Extinction/inattention -> 0 = No abnormality Medical Decision Making:  - Extensive number of diagnosis or management options are considered above.   - Extensive amount of complex data reviewed.   - High risk of complication and/or morbidity or mortality are associated with differential diagnostic considerations above.  - There may be Uncertain outcome and increased probability of prolonged functional impairment or high probability of severe prolonged functional impairment associated with some of these differential diagnosis.  Medical Data Reviewed:  1.Data reviewed include clinical labs, radiology,  Medical Tests;   2.Tests results discussed w/performing or interpreting physician;   3.Obtaining/reviewing old medical records;  4.Obtaining case history from another source;  5.Independent review of image, tracing or specimen.    Patient was informed the Neurology Consult would happen via telehealth (remote video) and consented to receiving care in this manner.

## 2018-01-30 NOTE — ED Notes (Signed)
Pt ambulated up to nurses station, advised this nurse to remove his IV so he can leave and go home. This nurse advised pt to return to room to remove IV. Pt then advised this nurse that he wants to go home he feels worse than before. This nurse adivsed pt of risks of leaving AMA, pt verbalized understanding of risks. MD notifed. Pt ambulated out of emergency department.
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
## 2018-01-30 NOTE — ED Triage Notes (Signed)
Pt c/o left sided head pain that radiates into neck. Pt c/o right sided face numbness and lips tingling. Pt states he took Fioricet and phenergan.

## 2018-01-30 NOTE — ED Notes (Signed)
Code stroke cancelled 

## 2018-01-30 NOTE — Progress Notes (Signed)
Code stroke  Beeper  135 am Call  None In Room 139 Out of room [place]  145 Radiologist 145

## 2018-02-02 ENCOUNTER — Other Ambulatory Visit: Payer: Self-pay

## 2018-02-02 NOTE — Patient Outreach (Signed)
[place] [facility]) Care Management  02/02/2018  LOGIN MUCKLEROY 1976/02/28 [id number]   Telephone Screen Referral Date :  02/02/2018 Referral Source: [facility] ED Census Referral Reason: ED Utilization Insurance: HTA   Outreach attempt # 1 To patient.  Outreach made to the patient for screening.  The patient answered the phone and [employer] explained about our services and the patient hung up the phone.   Plan: RN Health Coach will send letter for outreach attempt. RN Health Coach will wait for a response If no response in  ten business days will proceed with case closure.     [name] RN, BSN, [place] Direct Dial:  [phone] Fax: [phone]

## 2018-02-11 DIAGNOSIS — M5136 Other intervertebral disc degeneration, lumbar region: Secondary | ICD-10-CM | POA: Diagnosis not present

## 2018-02-11 DIAGNOSIS — G894 Chronic pain syndrome: Secondary | ICD-10-CM | POA: Diagnosis not present

## 2018-02-11 DIAGNOSIS — M545 Low back pain: Secondary | ICD-10-CM | POA: Diagnosis not present

## 2018-02-13 ENCOUNTER — Other Ambulatory Visit: Payer: Self-pay

## 2018-02-13 NOTE — Patient Outreach (Signed)
[name] [facility]) Care Management  02/13/2018  [name] 07-20-1976 [id number]   RN Health Coach closing the case due to unable to connect with the patient.  [name] RN, BSN, [place] Direct Dial:  [phone]  Fax: [phone]   '

## 2018-02-16 ENCOUNTER — Emergency Department (HOSPITAL_COMMUNITY)
Admission: EM | Admit: 2018-02-16 | Discharge: 2018-02-16 | Disposition: A | Discharge status: Home / Self Care | Payer: Medicare HMO | Attending: Emergency Medicine | Admitting: Emergency Medicine

## 2018-02-16 ENCOUNTER — Emergency Department (HOSPITAL_COMMUNITY): Payer: Medicare HMO

## 2018-02-16 ENCOUNTER — Other Ambulatory Visit: Payer: Self-pay

## 2018-02-16 ENCOUNTER — Encounter (HOSPITAL_COMMUNITY): Payer: Self-pay | Admitting: Emergency Medicine

## 2018-02-16 DIAGNOSIS — Z5321 Procedure and treatment not carried out due to patient leaving prior to being seen by health care provider: Secondary | ICD-10-CM | POA: Diagnosis not present

## 2018-02-16 DIAGNOSIS — N50811 Right testicular pain: Secondary | ICD-10-CM | POA: Insufficient documentation

## 2018-02-16 DIAGNOSIS — N5082 Scrotal pain: Secondary | ICD-10-CM | POA: Diagnosis not present

## 2018-02-16 DIAGNOSIS — R52 Pain, unspecified: Secondary | ICD-10-CM

## 2018-02-16 NOTE — ED Triage Notes (Signed)
Pt having right testicular pain and nausea since this am. History of torsion on left side 20 years ago.

## 2018-02-18 DIAGNOSIS — M129 Arthropathy, unspecified: Secondary | ICD-10-CM | POA: Diagnosis not present

## 2018-02-18 DIAGNOSIS — Z0000 Encounter for general adult medical examination without abnormal findings: Secondary | ICD-10-CM | POA: Diagnosis not present

## 2018-02-18 DIAGNOSIS — F17200 Nicotine dependence, unspecified, uncomplicated: Secondary | ICD-10-CM | POA: Diagnosis not present

## 2018-02-18 DIAGNOSIS — M5442 Lumbago with sciatica, left side: Secondary | ICD-10-CM | POA: Diagnosis not present

## 2018-02-18 DIAGNOSIS — E7800 Pure hypercholesterolemia, unspecified: Secondary | ICD-10-CM | POA: Diagnosis not present

## 2018-02-18 DIAGNOSIS — M5441 Lumbago with sciatica, right side: Secondary | ICD-10-CM | POA: Diagnosis not present

## 2018-02-18 DIAGNOSIS — Z79899 Other long term (current) drug therapy: Secondary | ICD-10-CM | POA: Diagnosis not present

## 2018-02-18 DIAGNOSIS — R0602 Shortness of breath: Secondary | ICD-10-CM | POA: Diagnosis not present

## 2018-02-18 DIAGNOSIS — F17210 Nicotine dependence, cigarettes, uncomplicated: Secondary | ICD-10-CM | POA: Diagnosis not present

## 2018-02-18 DIAGNOSIS — R5383 Other fatigue: Secondary | ICD-10-CM | POA: Diagnosis not present

## 2018-02-18 DIAGNOSIS — Z131 Encounter for screening for diabetes mellitus: Secondary | ICD-10-CM | POA: Diagnosis not present

## 2018-02-18 DIAGNOSIS — E559 Vitamin D deficiency, unspecified: Secondary | ICD-10-CM | POA: Diagnosis not present

## 2018-02-23 DIAGNOSIS — J301 Allergic rhinitis due to pollen: Secondary | ICD-10-CM | POA: Diagnosis not present

## 2018-02-23 DIAGNOSIS — I10 Essential (primary) hypertension: Secondary | ICD-10-CM | POA: Diagnosis not present

## 2018-02-23 DIAGNOSIS — G43909 Migraine, unspecified, not intractable, without status migrainosus: Secondary | ICD-10-CM | POA: Diagnosis not present

## 2018-02-25 DIAGNOSIS — G43909 Migraine, unspecified, not intractable, without status migrainosus: Secondary | ICD-10-CM | POA: Diagnosis not present

## 2018-02-25 DIAGNOSIS — J301 Allergic rhinitis due to pollen: Secondary | ICD-10-CM | POA: Diagnosis not present

## 2018-02-25 DIAGNOSIS — I10 Essential (primary) hypertension: Secondary | ICD-10-CM | POA: Diagnosis not present

## 2018-03-02 ENCOUNTER — Emergency Department (HOSPITAL_COMMUNITY)
Admission: EM | Admit: 2018-03-02 | Discharge: 2018-03-02 | Disposition: A | Discharge status: Home / Self Care | Payer: Medicare HMO | Attending: Emergency Medicine | Admitting: Emergency Medicine

## 2018-03-02 ENCOUNTER — Other Ambulatory Visit: Payer: Self-pay

## 2018-03-02 ENCOUNTER — Encounter (HOSPITAL_COMMUNITY): Payer: Self-pay | Admitting: *Deleted

## 2018-03-02 DIAGNOSIS — I10 Essential (primary) hypertension: Secondary | ICD-10-CM | POA: Insufficient documentation

## 2018-03-02 DIAGNOSIS — Z79899 Other long term (current) drug therapy: Secondary | ICD-10-CM | POA: Diagnosis not present

## 2018-03-02 DIAGNOSIS — M5440 Lumbago with sciatica, unspecified side: Secondary | ICD-10-CM | POA: Diagnosis not present

## 2018-03-02 DIAGNOSIS — M545 Low back pain: Secondary | ICD-10-CM | POA: Diagnosis not present

## 2018-03-02 DIAGNOSIS — J45909 Unspecified asthma, uncomplicated: Secondary | ICD-10-CM | POA: Insufficient documentation

## 2018-03-02 DIAGNOSIS — F17210 Nicotine dependence, cigarettes, uncomplicated: Secondary | ICD-10-CM | POA: Diagnosis not present

## 2018-03-02 MED ORDER — TRAMADOL HCL 50 MG PO TABS: 50.0000 mg | ORAL_TABLET | Freq: Four times a day (QID) | ORAL | 0 refills | Status: DC | PRN

## 2018-03-02 NOTE — ED Provider Notes (Signed)
[facility] EMERGENCY DEPARTMENT Provider Note   CSN: [id number] Arrival date & time: 03/02/18  1123     History   Chief Complaint Chief Complaint  Patient presents with  . Back Pain  . Medication Refill    HPI [name] is a 42 y.o. male.  He presents to the emergency department complaining of acute on chronic low back pain.  He is usually prescribed tramadol 50 mg 2 tablets 4 times a day as needed for pain by his primary care doctor.  He was unable to reach his primary care doctor as he was not in the office and the covering PCP was unwilling to write for the patient and referred him to the emergency department.  Patient is complaining of moderate to severe low back pain with no radiation.  Does not associate with any new weakness or numbness.  Patient is waiting referral to neurosurgery in [facility] also for possible surgery.  He has been taking Tylenol with minimal relief.  The history is provided by the patient.  Back Pain   This is a chronic problem. The current episode started more than 2 days ago. The problem occurs constantly. The problem has not changed since onset.The pain is associated with falling. The pain is present in the lumbar spine. The pain does not radiate. The pain is severe. Pertinent negatives include no chest pain, no fever, no abdominal pain, no bowel incontinence, no bladder incontinence, no dysuria and no weakness. He has tried analgesics for the symptoms. The treatment provided no relief.  Medication Refill  Medications/supplies requested:  Tramadol Reason for request:  Clinic/provider not available and medications ran out Medications taken before: yes - see home medications     Past Medical History:  Diagnosis Date  . Anxiety   . Asthma   . Bipolar 1 disorder ([place])   . Chronic back pain   . Complication of anesthesia    Anxiety when he awaken with ET tube still in place  . GERD (gastroesophageal reflux disease)   . Hyperlipidemia   .  Hypoventilation syndrome   . Left testicular torsion    "20 years ago"  . Lumbar radiculopathy   . Migraines   . Schizoaffective disorder ([place])    [place]    Patient Active Problem List   Diagnosis Date Noted  . Dysphagia 12/24/2017  . Schizophrenia ([place]) 05/28/2016  . Abdominal pain, epigastric 02/04/2013  . Constipation 12/02/2012  . Abdominal pain 11/22/2012  . INSOMNIA 04/26/2009  . BACK PAIN, LUMBAR 03/20/2009  . BIPOLAR AFFECTIVE DISORDER 02/17/2009  . OTHER ENTHESOPATHY OF ANKLE AND TARSUS 10/05/2008  . GERD 09/16/2008  . PERSONAL HISTORY OF SCHIZOPHRENIA 05/27/2008  . ESOPHAGITIS, REFLUX 03/23/2008  . HIATAL HERNIA 03/23/2008  . HYPERLIPIDEMIA 01/07/2008  . MORBID OBESITY 01/07/2008  . ANXIETY DEPRESSION 01/07/2008  . TOBACCO ABUSE 01/07/2008  . HYPERTENSION 01/07/2008  . ALLERGIC RHINITIS 01/07/2008  . IBS 01/07/2008    Past Surgical History:  Procedure Laterality Date  . ANKLE SURGERY    . APPENDECTOMY    . BIOPSY  01/15/2018   Procedure: BIOPSY;  Surgeon: Daneil Dolin, MD;  [facility];  Service: Endoscopy;;  gastric  . CHOLECYSTECTOMY    . ESOPHAGOGASTRODUODENOSCOPY  03/17/2008   RMR: A tiny distal esophageal erosions consistent with mild  erosive reflux esophagitis, otherwise normal esophagus, small hiatal hernia, minimally polypoid antral mucosa with doubtful clinical was significance.  Otherwise, normal stomach, patent pylorus, and normal D1-D2  . ESOPHAGOGASTRODUODENOSCOPY (  EGD) WITH PROPOFOL N/A 02/18/2013   Procedure: ESOPHAGOGASTRODUODENOSCOPY (EGD) WITH PROPOFOL;  Surgeon: Daneil Dolin, MD;  Location: [facility] ORS;  Service: Endoscopy;  Laterality: N/A;  . ESOPHAGOGASTRODUODENOSCOPY (EGD) WITH PROPOFOL N/A 01/15/2018   Procedure: ESOPHAGOGASTRODUODENOSCOPY (EGD) WITH PROPOFOL;  Surgeon: Daneil Dolin, MD;  [facility];  Service: Endoscopy;  Laterality: N/A;  2:15pm  . MALONEY DILATION N/A 01/15/2018   Procedure: Venia Minks DILATION;   Surgeon: Daneil Dolin, MD;  Location[facility];  Service: Endoscopy;  Laterality: N/A;  . NASAL SINUS SURGERY  01/24/2012   Procedure: ENDOSCOPIC SINUS SURGERY;  Surgeon: Izora Gala, MD;  Location: [place];  Service: ENT;  Laterality: Left;  left ethmoidectomy, maxillary, frontal  . UMBILICAL HERNIA REPAIR          Home Medications    Prior to Admission medications   Medication Sig Start Date End Date Taking? Authorizing Provider  ALPRAZolam Duanne Moron) 1 MG tablet Take 1 mg by mouth 4 (four) times daily.     [provider]  ARIPiprazole (ABILIFY) 20 MG tablet Take 20 mg by mouth at bedtime.     [provider]  baclofen (LIORESAL) 20 MG tablet Take 20 mg by mouth 3 (three) times daily. 11/02/17   [provider]  citalopram (CELEXA) 40 MG tablet Take 40 mg by mouth every morning.     [provider]  gabapentin (NEURONTIN) 300 MG capsule Take 1 capsule by mouth 3 (three) times daily. 11/20/17   [provider]  ibuprofen (ADVIL,MOTRIN) 600 MG tablet Take 1 tablet (600 mg total) by mouth every 6 (six) hours as needed. 01/08/18   Evalee Jefferson, PA-C  methylphenidate (RITALIN) 20 MG tablet Take 20 mg by mouth daily. 10/28/17   [provider]  naproxen sodium (ALEVE) 220 MG tablet Take 220 mg by mouth daily as needed (headache / pain).    [provider]  pantoprazole (PROTONIX) 40 MG tablet Take 40 mg by mouth daily.     [provider]  sucralfate (CARAFATE) 1 g tablet Take 1 tablet (1 g total) by mouth 4 (four) times daily as needed. Patient taking differently: Take 1 g by mouth 3 (three) times daily.  12/24/17   Carlis Stable, NP  temazepam (RESTORIL) 30 MG capsule Take 30 mg by mouth at bedtime.  11/24/17   [provider]  traMADol (ULTRAM) 50 MG tablet Take 1 tablet (50 mg total) by mouth every 6 (six) hours as needed. 01/08/18   Evalee Jefferson, PA-C    Family History Family History  Problem Relation Age of  Onset  . Anesthesia problems Mother   . Colon cancer Maternal Grandfather   . Crohn's disease Maternal Grandfather   . Gastric cancer Paternal Uncle   . Esophageal cancer Neg Hx     Social History Social History   Tobacco Use  . Smoking status: Current Every Day Smoker    Packs/day: 1.00    Years: 22.00    Pack years: 22.00    Types: Cigarettes  . Smokeless tobacco: Never Used  Substance Use Topics  . Alcohol use: Yes    Comment: Rare: 1 beer every 6-7 months  . Drug use: No     Allergies   Compazine; Imitrex [sumatriptan base]; Risperidone and related; and Risperidone   Review of Systems Review of Systems  Constitutional: Negative for fever.  HENT: Negative for sore throat.   Respiratory: Negative for shortness of breath.   Cardiovascular: Negative  for chest pain.  Gastrointestinal: Negative for abdominal pain and bowel incontinence.  Genitourinary: Negative for bladder incontinence and dysuria.  Musculoskeletal: Positive for back pain.  Skin: Negative for rash.  Neurological: Negative for weakness.     Physical Exam Updated Vital Signs BP 130/76 (BP Location: Right Arm)   Pulse 96   Temp 98.3 F (36.8 C) (Tympanic)   Resp 18   Ht 6\' 3"  (1.905 m)   Wt (!) 142.4 kg (314 lb)   SpO2 95%   BMI 39.25 kg/m   Physical Exam  Constitutional: He appears well-developed and well-nourished.  HENT:  Head: Normocephalic and atraumatic.  Neck: Neck supple.  Cardiovascular: Normal rate and regular rhythm.  No murmur heard. Pulmonary/Chest: Effort normal and breath sounds normal. No respiratory distress.  Abdominal: Soft. There is no tenderness.  Musculoskeletal: He exhibits no edema or deformity.       Lumbar back: He exhibits tenderness, bony tenderness and pain.  Neurological: He is alert.  Skin: Skin is warm and dry.  Psychiatric: He has a normal mood and affect.  Nursing note and vitals reviewed.    ED Treatments / Results  Labs (all labs ordered are  listed, but only abnormal results are displayed) Labs Reviewed - No data to display  EKG None  Radiology No results found.  Procedures Procedures (including critical care time)  Medications Ordered in ED Medications - No data to display   Initial Impression / Assessment and Plan / ED Course  I have reviewed the triage vital signs and the nursing notes.  Pertinent labs & imaging results that were available during my care of the patient were reviewed by me and considered in my medical decision making (see chart for details).  Clinical Course as of Mar 04 1111  Mon Mar 02, 2018  1330 Patient with a history of chronic back pain here as he is out of his tramadol which he takes for his back pain.  He also states he fell on Tuesday and has worsening of his pain since then.  On exam his tenderness over his lumbar spine but the mechanism sounded minor and I do not plain film imaging would be any abuse.  I will refill his tramadol for a week until he can see his PCP.   [MB]    Clinical Course User Index [MB] Hayden Rasmussen, MD    Final Clinical Impressions(s) / ED Diagnoses   Final diagnoses:  Acute midline low back pain, with sciatica presence unspecified    ED Discharge Orders        Ordered    traMADol (ULTRAM) 50 MG tablet  Every 6 hours PRN     03/02/18 1332       Hayden Rasmussen, MD 03/03/18 1113

## 2018-03-02 NOTE — ED Triage Notes (Signed)
Pt c/o acute on chronic lower back pain that started a few days ago. Denies injury. Pt reports he normally gets Tramadol prn from his PCP when he has flare ups but his PCP is closed today.

## 2018-03-02 NOTE — Discharge Instructions (Addendum)
You were evaluated in the emergency department for worsening of your low back pain and being out of your pain medicine tramadol.  We are giving you a small prescription of tramadol but you will need to contact her primary care doctor for further medication.

## 2018-03-02 NOTE — ED Notes (Addendum)
Pt reports out of his tramadol  Called [facility] for Dr Luan Pulling who would not refill  Reports fell, and here for refill of meds as he has no pain meds  Has been referred to [place] for pain clinic

## 2018-03-04 ENCOUNTER — Telehealth: Payer: Self-pay | Admitting: Internal Medicine

## 2018-03-04 ENCOUNTER — Other Ambulatory Visit: Payer: Self-pay

## 2018-03-04 MED ORDER — PANTOPRAZOLE SODIUM 40 MG PO TBEC: 40.0000 mg | DELAYED_RELEASE_TABLET | Freq: Two times a day (BID) | ORAL | 5 refills | Status: DC

## 2018-03-04 NOTE — Telephone Encounter (Signed)
[phone] PATIENT NEEDS PROTONIX 2X DAY PRESCRIPTION SENT TO [facility]   RMR CHANGED HIM FROM 1 A DAY TO 2 DAILY

## 2018-03-04 NOTE — Addendum Note (Signed)
Addended by: Mahala Menghini on: 03/04/2018 12:05 PM   Modules accepted: Orders

## 2018-03-04 NOTE — Patient Outreach (Signed)
[facility]) Care Management  03/04/2018  [name] [birth year] [id number]   Telephone Screen Referral Date : 03/03/2018 Referral Source: [place] Referral Reason: ED Utilization Insurance: [place] attempt # 1 To patient.  Telephone to the patient for screening. No answer.  HIPAA compliant voicemail left with contact information.  Plan: RN Health Coach will make an outreach attempt to the patient in three to four days.   [name] RN, BSN, [place] Direct Dial:  [phone] Fax: [phone]

## 2018-03-04 NOTE — Telephone Encounter (Signed)
Noted  

## 2018-03-04 NOTE — Telephone Encounter (Signed)
Forwarding to Refill box.  

## 2018-03-05 ENCOUNTER — Telehealth: Payer: Self-pay

## 2018-03-05 NOTE — Telephone Encounter (Signed)
Received an approval letter from [facility]. Pantoprazole is good through 02/2019. Pt was notified and he's going to contact his pharmacy.

## 2018-03-05 NOTE — Telephone Encounter (Signed)
Pt walked in office saying [employer] sent a fax stating that pt can get his Protonix's if he's not taking Omeprazole. Our office hasn't received a letter from [facility] [place]. I will check into to this for pt. Samples of Dexilant were given to pt per [facility] while he waits for his Protonix.

## 2018-03-05 NOTE — Telephone Encounter (Signed)
Spoke with [name] and they had pt taking Omeprazole and Protonix. They asked a few questions about pts medication history. [employer] is aware that pt is taking Protonix only at this time. They will contact our office with an updated review once PA is completed. Pt is aware of this conversation.

## 2018-03-09 ENCOUNTER — Other Ambulatory Visit: Payer: Self-pay

## 2018-03-09 DIAGNOSIS — B078 Other viral warts: Secondary | ICD-10-CM | POA: Diagnosis not present

## 2018-03-09 DIAGNOSIS — D485 Neoplasm of uncertain behavior of skin: Secondary | ICD-10-CM | POA: Diagnosis not present

## 2018-03-09 NOTE — Patient Outreach (Signed)
[facility]) Care Management  03/09/2018  DAIVIK OVERLEY 1976/10/25 [id number]   Telephone Screen Referral Date : 03/03/2018 Referral Source: [place] Referral Reason: ED Utilization Insurance: [place] attempt # 2 To patient.  Telephone to the patient for screening. No answer.  HIPAA compliant voicemail left with contact information.  Plan: RN Health Coach will make an outreach attempt to the patient in three to four days.   [name] RN, BSN, [place] Direct Dial: [phone]

## 2018-03-10 ENCOUNTER — Encounter (HOSPITAL_COMMUNITY): Payer: Self-pay | Admitting: *Deleted

## 2018-03-10 ENCOUNTER — Emergency Department (HOSPITAL_COMMUNITY)
Admission: EM | Admit: 2018-03-10 | Discharge: 2018-03-11 | Disposition: A | Discharge status: Home / Self Care | Payer: Medicare HMO | Attending: Emergency Medicine | Admitting: Emergency Medicine

## 2018-03-10 DIAGNOSIS — F1120 Opioid dependence, uncomplicated: Secondary | ICD-10-CM | POA: Diagnosis not present

## 2018-03-10 DIAGNOSIS — J45909 Unspecified asthma, uncomplicated: Secondary | ICD-10-CM | POA: Insufficient documentation

## 2018-03-10 DIAGNOSIS — M79641 Pain in right hand: Secondary | ICD-10-CM | POA: Diagnosis not present

## 2018-03-10 DIAGNOSIS — B079 Viral wart, unspecified: Secondary | ICD-10-CM | POA: Diagnosis not present

## 2018-03-10 DIAGNOSIS — Z79899 Other long term (current) drug therapy: Secondary | ICD-10-CM | POA: Diagnosis not present

## 2018-03-10 DIAGNOSIS — Z5189 Encounter for other specified aftercare: Secondary | ICD-10-CM

## 2018-03-10 DIAGNOSIS — Z4801 Encounter for change or removal of surgical wound dressing: Secondary | ICD-10-CM | POA: Diagnosis not present

## 2018-03-10 DIAGNOSIS — I10 Essential (primary) hypertension: Secondary | ICD-10-CM | POA: Insufficient documentation

## 2018-03-10 DIAGNOSIS — F17210 Nicotine dependence, cigarettes, uncomplicated: Secondary | ICD-10-CM | POA: Insufficient documentation

## 2018-03-10 MED ORDER — IBUPROFEN 600 MG PO TABS: 600.0000 mg | ORAL_TABLET | Freq: Four times a day (QID) | ORAL | 0 refills | Status: DC | PRN

## 2018-03-10 MED ORDER — KETOROLAC TROMETHAMINE 30 MG/ML IJ SOLN
30.0000 mg | Freq: Once | INTRAMUSCULAR | Status: AC
  Administered 2018-03-11: 30 mg via INTRAMUSCULAR
  Filled 2018-03-10: qty 1

## 2018-03-10 MED ORDER — BACITRACIN ZINC 500 UNIT/GM EX OINT: 1.0000 "application " | TOPICAL_OINTMENT | Freq: Two times a day (BID) | CUTANEOUS | 0 refills | Status: DC

## 2018-03-10 MED ORDER — DOUBLE ANTIBIOTIC 500-10000 UNIT/GM EX OINT
TOPICAL_OINTMENT | Freq: Once | CUTANEOUS | Status: AC
  Administered 2018-03-11: via TOPICAL
  Filled 2018-03-10: qty 1

## 2018-03-10 NOTE — ED Provider Notes (Signed)
[facility] EMERGENCY DEPARTMENT Provider Note   CSN: [id number] Arrival date & time: 03/10/18  2254     History   Chief Complaint Chief Complaint  Patient presents with  . Wound Check    HPI [name] is a 42 y.o. male.  HPI  This 42 year old male with a history of bipolar disorder, asthma, hyperlipidemia who presents for wound check.  Patient reports that he had a wart removed from his right hand yesterday morning.  It was cut off.  He states that he removed the bandage yesterday afternoon.  Since that time he has had increasing pain that radiates up into his wrist.  He has taken naproxen with minimal relief of pain.  Currently he rates his pain at 7 out of 10.  He has also noted some drainage.  Denies fevers or skin changes.  Past Medical History:  Diagnosis Date  . Anxiety   . Asthma   . Bipolar 1 disorder ([place])   . Chronic back pain   . Complication of anesthesia    Anxiety when he awaken with ET tube still in place  . GERD (gastroesophageal reflux disease)   . Hyperlipidemia   . Hypoventilation syndrome   . Left testicular torsion    "20 years ago"  . Lumbar radiculopathy   . Migraines   . Schizoaffective disorder ([place])    [place]    Patient Active Problem List   Diagnosis Date Noted  . Dysphagia 12/24/2017  . Schizophrenia ([place]) 05/28/2016  . Abdominal pain, epigastric 02/04/2013  . Constipation 12/02/2012  . Abdominal pain 11/22/2012  . INSOMNIA 04/26/2009  . BACK PAIN, LUMBAR 03/20/2009  . BIPOLAR AFFECTIVE DISORDER 02/17/2009  . OTHER ENTHESOPATHY OF ANKLE AND TARSUS 10/05/2008  . GERD 09/16/2008  . PERSONAL HISTORY OF SCHIZOPHRENIA 05/27/2008  . ESOPHAGITIS, REFLUX 03/23/2008  . HIATAL HERNIA 03/23/2008  . HYPERLIPIDEMIA 01/07/2008  . MORBID OBESITY 01/07/2008  . ANXIETY DEPRESSION 01/07/2008  . TOBACCO ABUSE 01/07/2008  . HYPERTENSION 01/07/2008  . ALLERGIC RHINITIS 01/07/2008  . IBS 01/07/2008    Past Surgical History:    Procedure Laterality Date  . ANKLE SURGERY    . APPENDECTOMY    . BIOPSY  01/15/2018   Procedure: BIOPSY;  Surgeon: Daneil Dolin, MD;  [facility];  Service: Endoscopy;;  gastric  . CHOLECYSTECTOMY    . ESOPHAGOGASTRODUODENOSCOPY  03/17/2008   RMR: A tiny distal esophageal erosions consistent with mild  erosive reflux esophagitis, otherwise normal esophagus, small hiatal hernia, minimally polypoid antral mucosa with doubtful clinical was significance.  Otherwise, normal stomach, patent pylorus, and normal D1-D2  . ESOPHAGOGASTRODUODENOSCOPY (EGD) WITH PROPOFOL N/A 02/18/2013   Procedure: ESOPHAGOGASTRODUODENOSCOPY (EGD) WITH PROPOFOL;  Surgeon: Daneil Dolin, MD;  Location: [facility];  Service: Endoscopy;  Laterality: N/A;  . ESOPHAGOGASTRODUODENOSCOPY (EGD) WITH PROPOFOL N/A 01/15/2018   Procedure: ESOPHAGOGASTRODUODENOSCOPY (EGD) WITH PROPOFOL;  Surgeon: Daneil Dolin, MD;  [facility];  Service: Endoscopy;  Laterality: N/A;  2:15pm  . MALONEY DILATION N/A 01/15/2018   Procedure: Venia Minks DILATION;  Surgeon: Daneil Dolin, MD;  [facility];  Service: Endoscopy;  Laterality: N/A;  . NASAL SINUS SURGERY  01/24/2012   Procedure: ENDOSCOPIC SINUS SURGERY;  Surgeon: Izora Gala, MD;  Location: [place];  Service: ENT;  Laterality: Left;  left ethmoidectomy, maxillary, frontal  . UMBILICAL HERNIA REPAIR          Home Medications    Prior to Admission medications  Medication Sig Start Date End Date Taking? Authorizing Provider  ALPRAZolam Duanne Moron) 1 MG tablet Take 1 mg by mouth 4 (four) times daily.     [provider]  ARIPiprazole (ABILIFY) 20 MG tablet Take 20 mg by mouth at bedtime.     [provider]  bacitracin ointment Apply 1 application topically 2 (two) times daily. 03/10/18   Leam Madero, Barbette Hair, MD  baclofen (LIORESAL) 20 MG tablet Take 20 mg by mouth 3 (three) times daily. 11/02/17   [provider]  citalopram (CELEXA) 40  MG tablet Take 40 mg by mouth every morning.     [provider]  gabapentin (NEURONTIN) 300 MG capsule Take 1 capsule by mouth 3 (three) times daily. 11/20/17   [provider]  ibuprofen (ADVIL,MOTRIN) 600 MG tablet Take 1 tablet (600 mg total) by mouth every 6 (six) hours as needed. 01/08/18   Evalee Jefferson, PA-C  ibuprofen (ADVIL,MOTRIN) 600 MG tablet Take 1 tablet (600 mg total) by mouth every 6 (six) hours as needed. 03/10/18   Mychelle Kendra, Barbette Hair, MD  methylphenidate (RITALIN) 20 MG tablet Take 20 mg by mouth daily. 10/28/17   [provider]  naproxen sodium (ALEVE) 220 MG tablet Take 220 mg by mouth daily as needed (headache / pain).    [provider]  pantoprazole (PROTONIX) 40 MG tablet Take 1 tablet (40 mg total) by mouth 2 (two) times daily before a meal. 03/04/18   Mahala Menghini, PA-C  sucralfate (CARAFATE) 1 g tablet Take 1 tablet (1 g total) by mouth 4 (four) times daily as needed. Patient taking differently: Take 1 g by mouth 3 (three) times daily.  12/24/17   Carlis Stable, NP  temazepam (RESTORIL) 30 MG capsule Take 30 mg by mouth at bedtime.  11/24/17   [provider]  traMADol (ULTRAM) 50 MG tablet Take 1 tablet (50 mg total) by mouth every 6 (six) hours as needed. 03/02/18   Hayden Rasmussen, MD    Family History Family History  Problem Relation Age of Onset  . Anesthesia problems Mother   . Colon cancer Maternal Grandfather   . Crohn's disease Maternal Grandfather   . Gastric cancer Paternal Uncle   . Esophageal cancer Neg Hx     Social History Social History   Tobacco Use  . Smoking status: Current Every Day Smoker    Packs/day: 1.00    Years: 22.00    Pack years: 22.00    Types: Cigarettes  . Smokeless tobacco: Never Used  Substance Use Topics  . Alcohol use: Yes    Comment: Rare: 1 beer every 6-7 months  . Drug use: No     Allergies   Compazine; Imitrex [sumatriptan base]; Risperidone and related; and  Risperidone   Review of Systems Review of Systems  Constitutional: Negative for fever.  Skin: Positive for wound. Negative for color change.  All other systems reviewed and are negative.    Physical Exam Updated Vital Signs BP 124/80 (BP Location: Right Arm)   Pulse 94   Temp 98.7 F (37.1 C) (Oral)   Resp 20   Ht 6\' 3"  (1.905 m)   Wt 133.8 kg (295 lb)   SpO2 98%   BMI 36.87 kg/m   Physical Exam  Constitutional: He is oriented to person, place, and time. He appears well-developed and well-nourished.  Obese  HENT:  Head: Normocephalic and atraumatic.  Cardiovascular: Normal rate and regular rhythm.  Pulmonary/Chest: Effort normal. No respiratory  distress.  Musculoskeletal: He exhibits no edema.  Neurological: He is alert and oriented to person, place, and time.  Skin: Skin is warm and dry.  7 to 8 mm well-circumscribed wound on the medial aspect of the right hand just proximal to the fifth digit, no active bleeding, no significant erythema or redness, no active drainage, wound base appears well-healing, no streaking redness, normal range of motion of the MCP, DIP and PIP joints  Psychiatric: He has a normal mood and affect.  Nursing note and vitals reviewed.      ED Treatments / Results  Labs (all labs ordered are listed, but only abnormal results are displayed) Labs Reviewed - No data to display  EKG None  Radiology No results found.  Procedures Procedures (including critical care time)  Medications Ordered in ED Medications  polymixin-bacitracin (POLYSPORIN) ointment (has no administration in time range)  ketorolac (TORADOL) 30 MG/ML injection 30 mg (has no administration in time range)     Initial Impression / Assessment and Plan / ED Course  I have reviewed the triage vital signs and the nursing notes.  Pertinent labs & imaging results that were available during my care of the patient were reviewed by me and considered in my medical decision making  (see chart for details).     Patient presents with increasing pain at site of wart removal.  No signs of active infection.  Appears well-healing.  No signs or symptoms of cellulitis.  Patient given IM Toradol.  Recommend antibiotic ointment given reports of drainage.  Also follow-up closely with primary physician who removed wart for recheck.  If he develops fevers, signs or symptoms of cellulitis, he should be reevaluated immediately.  After history, exam, and medical workup I feel the patient has been appropriately medically screened and is safe for discharge home. Pertinent diagnoses were discussed with the patient. Patient was given return precautions.   Final Clinical Impressions(s) / ED Diagnoses   Final diagnoses:  Visit for wound check    ED Discharge Orders        Ordered    bacitracin ointment  2 times daily     03/10/18 2354    ibuprofen (ADVIL,MOTRIN) 600 MG tablet  Every 6 hours PRN     03/10/18 2355       Merryl Hacker, MD 03/11/18 0000

## 2018-03-10 NOTE — Discharge Instructions (Addendum)
You are seen today for wound check.  There is no obvious active infection of the skin.  Take ibuprofen for pain.  Recommend applying bacitracin ointment twice daily and follow-up with your doctor who removed the wart for wound check.  If you develop fever, streaking redness, redness of the skin, you should be reevaluated and may need systemic antibiotics.

## 2018-03-10 NOTE — ED Triage Notes (Signed)
Pt had a wart removed to right hand yesterday, c/o pain. Pt did not apply a dsg to site.

## 2018-03-11 ENCOUNTER — Encounter: Payer: Self-pay | Admitting: Internal Medicine

## 2018-03-11 ENCOUNTER — Other Ambulatory Visit: Payer: Self-pay

## 2018-03-11 DIAGNOSIS — B079 Viral wart, unspecified: Secondary | ICD-10-CM | POA: Diagnosis not present

## 2018-03-11 NOTE — Patient Outreach (Signed)
[facility]) Care Management  03/11/2018  LYRIC HOAR 13-Jun-1976 [id number]   Telephone Screen Referral Date : 03/03/2018 Referral Source: [facility] ED Census Referral Reason: Ed Utilization Insurance: [employer]anny [employer]  Outreach attempt # 3 To patient.  Telephone call to the patient for screening.  The patient answered the phone and hung up.  Plan: RN Health Coach will close the case due to unable to contact.   [name] RN, BSN, [place] Direct Dial:  [phone] Fax: [phone]

## 2018-03-16 ENCOUNTER — Other Ambulatory Visit: Payer: Self-pay | Admitting: *Deleted

## 2018-03-16 NOTE — Patient Outreach (Signed)
[name]) Care Management  03/16/2018  [name] [birth year] [id number]   Telephone Screen  Referral Date:  03/03/2018 Referral Source:  [facility] ED Census Reason for Referral:  6 or more ED Visits in the past 6 months Insurance:  [employer]   Outreach Attempt:  No return call from previous voice messages and mailings left by previous Research scientist (medical).  Plan:  RN Health Coach will close case at this time due to inability to establish contact with patient.   [place] [phone] [email or web address]

## 2018-03-23 ENCOUNTER — Other Ambulatory Visit: Payer: Self-pay | Admitting: Pulmonary Disease

## 2018-03-23 DIAGNOSIS — G8929 Other chronic pain: Secondary | ICD-10-CM

## 2018-03-23 DIAGNOSIS — M545 Low back pain: Principal | ICD-10-CM

## 2018-03-25 ENCOUNTER — Ambulatory Visit: Payer: Medicare HMO | Admitting: Nurse Practitioner

## 2018-03-27 ENCOUNTER — Ambulatory Visit
Admission: RE | Admit: 2018-03-27 | Discharge: 2018-03-27 | Disposition: A | Discharge status: Home / Self Care | Payer: Medicare HMO | Source: Ambulatory Visit | Attending: Pulmonary Disease | Admitting: Pulmonary Disease

## 2018-03-27 DIAGNOSIS — G8929 Other chronic pain: Secondary | ICD-10-CM

## 2018-03-27 DIAGNOSIS — M47817 Spondylosis without myelopathy or radiculopathy, lumbosacral region: Secondary | ICD-10-CM | POA: Diagnosis not present

## 2018-03-27 DIAGNOSIS — M545 Low back pain: Principal | ICD-10-CM

## 2018-03-27 MED ORDER — IOPAMIDOL (ISOVUE-M 200) INJECTION 41%
1.0000 mL | Freq: Once | INTRAMUSCULAR | Status: AC
  Administered 2018-03-27: 1 mL via EPIDURAL

## 2018-03-27 MED ORDER — METHYLPREDNISOLONE ACETATE 40 MG/ML INJ SUSP (RADIOLOG
120.0000 mg | Freq: Once | INTRAMUSCULAR | Status: AC
  Administered 2018-03-27: 120 mg via EPIDURAL

## 2018-03-27 NOTE — Discharge Instructions (Signed)

## 2018-03-30 DIAGNOSIS — F1120 Opioid dependence, uncomplicated: Secondary | ICD-10-CM | POA: Diagnosis not present

## 2018-03-30 DIAGNOSIS — Z7151 Drug abuse counseling and surveillance of drug abuser: Secondary | ICD-10-CM | POA: Diagnosis not present

## 2018-03-30 DIAGNOSIS — F1320 Sedative, hypnotic or anxiolytic dependence, uncomplicated: Secondary | ICD-10-CM | POA: Diagnosis not present

## 2018-04-01 DIAGNOSIS — F200 Paranoid schizophrenia: Secondary | ICD-10-CM | POA: Diagnosis not present

## 2018-04-01 DIAGNOSIS — F1120 Opioid dependence, uncomplicated: Secondary | ICD-10-CM | POA: Diagnosis not present

## 2018-04-01 DIAGNOSIS — F1320 Sedative, hypnotic or anxiolytic dependence, uncomplicated: Secondary | ICD-10-CM | POA: Diagnosis not present

## 2018-04-08 DIAGNOSIS — F17200 Nicotine dependence, unspecified, uncomplicated: Secondary | ICD-10-CM | POA: Diagnosis not present

## 2018-04-08 DIAGNOSIS — Z7151 Drug abuse counseling and surveillance of drug abuser: Secondary | ICD-10-CM | POA: Diagnosis not present

## 2018-04-08 DIAGNOSIS — F1320 Sedative, hypnotic or anxiolytic dependence, uncomplicated: Secondary | ICD-10-CM | POA: Diagnosis not present

## 2018-04-08 DIAGNOSIS — F419 Anxiety disorder, unspecified: Secondary | ICD-10-CM | POA: Diagnosis not present

## 2018-04-08 DIAGNOSIS — F1120 Opioid dependence, uncomplicated: Secondary | ICD-10-CM | POA: Diagnosis not present

## 2018-04-14 ENCOUNTER — Other Ambulatory Visit: Payer: Self-pay | Admitting: *Deleted

## 2018-04-14 ENCOUNTER — Encounter: Payer: Self-pay | Admitting: *Deleted

## 2018-04-14 ENCOUNTER — Telehealth: Payer: Self-pay | Admitting: *Deleted

## 2018-04-14 ENCOUNTER — Encounter: Payer: Self-pay | Admitting: Nurse Practitioner

## 2018-04-14 ENCOUNTER — Ambulatory Visit (INDEPENDENT_AMBULATORY_CARE_PROVIDER_SITE_OTHER): Payer: Medicare HMO | Admitting: Nurse Practitioner

## 2018-04-14 VITALS — BP 141/82 | HR 88 | Temp 97.4°F | Ht 75.0 in | Wt 309.2 lb

## 2018-04-14 DIAGNOSIS — K625 Hemorrhage of anus and rectum: Secondary | ICD-10-CM

## 2018-04-14 DIAGNOSIS — K5900 Constipation, unspecified: Secondary | ICD-10-CM

## 2018-04-14 DIAGNOSIS — K219 Gastro-esophageal reflux disease without esophagitis: Secondary | ICD-10-CM | POA: Diagnosis not present

## 2018-04-14 MED ORDER — PEG 3350-KCL-NA BICARB-NACL 420 G PO SOLR: 4000.0000 mL | Freq: Once | ORAL | 0 refills | Status: AC

## 2018-04-14 NOTE — Assessment & Plan Note (Signed)
Significant constipation, only on over-the-counter medications.  He was previously on Linzess and this worked well.  This point I will start him on Linzess 145 mcg.  We will provide samples for 1 to 2 weeks and request progress report 1 to 2 weeks.  Proceed with colonoscopy as per above.  Follow-up in 3 months.

## 2018-04-14 NOTE — Progress Notes (Signed)
Referring Provider: Sinda Du, MD Primary Care Physician:  Sinda Du, MD Primary GI:  Dr. Gala Romney  Chief Complaint  Patient presents with  . Rectal Bleeding    sometimes dark, sometimes bright red    HPI:   [name] is a 42 y.o. male who presents follow-up on GERD and dysphasia.  The patient was last seen in our office 12/24/2017 for esophagitis, dysphagia, gastroparesis.  EGD previously completed May 2009 with erosive esophagitis.  Status post cholecystectomy.  No improvement with Prilosec, Protonix, Nexium.  Did well with Dexilant.  Constipation at one point required MiraLAX purge.  Has gone as long as 2 weeks without a bowel movement.  CT completed 02/05/2013 which found no acute abnormality, no evidence of hernia, status post cholecystectomy.  His last visit he noted severe reflux, chronic GERD for a number years.  A gastroenterologist in [place] stated they were going to "do something laparoscopic to help Throwing up Acid."  Has Tried Nexium, Prilosec, Protonix, Dexilant, Zantac, Pepcid.  Has Not Tried AcipHex.  Currently on Protonix, Although It Is Previously Not Worked.  Has Nausea and Regurgitation of Acid/Bile.  Noted Solid Food Dysphagia Daily with Occasional Regurgitation.  Waxing and Waning Bowel Movements Sometimes Right after Eating and Sometimes 3 Days with No Bowel Movement.  Did Not Have GES Completed As Recommended after His Last EGD.  States He Had One in [place].  No Other GI Symptoms.  Recommended GES, Protonix Twice a Day, Carafate As Needed, EGD with Possible Dilation, Follow-Up in 3 Months.  EGD completed 01/15/2018 which found normal esophagus status post dilation, erosive gastropathy, gastric ulcer status post biopsy, normal duodenum.  Recommended continue current medications.  Surgical pathology found the biopsy to be reactive gastropathy without H. Pylori.  Again recommended continue Protonix 40 mg twice daily.  Today he states he is  still having constipation. Has a bowel movement about every day to every 3 days; stools are hard and require straining. Sometimes has diarrhea. Has abdominal pain lower abdomen, no improvement after a bowel movement. Started seeing blood prior to EGD. This was mentioned prior to his EGD and was told if it continues that he'd likely need a colonoscopy. Sees blood anywhere from every bowel movement and as infrequently as every 3 bowel movements. The blood ranges from bright red to dark red. Has had some unintentional weight loss of about 8 lbs over 3 weeks, subjectively. Objectively has lost about 17 lbs over the past 3-4 months. Has N/V, vomiting variable frequency. Avoids trigger foods. GERD is doing better on bid Protonix. Denies fever, chills. Denies chest pain, dyspnea, dizziness, lightheadedness, syncope, near syncope. Denies any other upper or lower GI symptoms.  He is not on any medications for constipation currently.  Past Medical History:  Diagnosis Date  . Anxiety   . Asthma   . Bipolar 1 disorder ([place])   . Chronic back pain   . Complication of anesthesia    Anxiety when he awaken with ET tube still in place  . GERD (gastroesophageal reflux disease)   . Hyperlipidemia   . Hypoventilation syndrome   . Left testicular torsion    "20 years ago"  . Lumbar radiculopathy   . Migraines   . Schizoaffective disorder ([place])    Day Mark    Past Surgical History:  Procedure Laterality Date  . ANKLE SURGERY    . APPENDECTOMY    . BIOPSY  01/15/2018   Procedure: BIOPSY;  Surgeon: Daneil Dolin,  MD;  [facility];  Service: Endoscopy;;  gastric  . CHOLECYSTECTOMY    . ESOPHAGOGASTRODUODENOSCOPY  03/17/2008   RMR: A tiny distal esophageal erosions consistent with mild  erosive reflux esophagitis, otherwise normal esophagus, small hiatal hernia, minimally polypoid antral mucosa with doubtful clinical was significance.  Otherwise, normal stomach, patent pylorus, and normal D1-D2  .  ESOPHAGOGASTRODUODENOSCOPY (EGD) WITH PROPOFOL N/A 02/18/2013   Procedure: ESOPHAGOGASTRODUODENOSCOPY (EGD) WITH PROPOFOL;  Surgeon: Daneil Dolin, MD;  Location: [facility];  Service: Endoscopy;  Laterality: N/A;  . ESOPHAGOGASTRODUODENOSCOPY (EGD) WITH PROPOFOL N/A 01/15/2018   Procedure: ESOPHAGOGASTRODUODENOSCOPY (EGD) WITH PROPOFOL;  Surgeon: Daneil Dolin, MD;  [facility];  Service: Endoscopy;  Laterality: N/A;  2:15pm  . MALONEY DILATION N/A 01/15/2018   Procedure: Venia Minks DILATION;  Surgeon: Daneil Dolin, MD;  [facility];  Service: Endoscopy;  Laterality: N/A;  . NASAL SINUS SURGERY  01/24/2012   Procedure: ENDOSCOPIC SINUS SURGERY;  Surgeon: Izora Gala, MD;  Location: [facility];  Service: ENT;  Laterality: Left;  left ethmoidectomy, maxillary, frontal  . UMBILICAL HERNIA REPAIR      Current Outpatient Medications  Medication Sig Dispense Refill  . ALPRAZolam (XANAX) 1 MG tablet Take 1 mg by mouth 4 (four) times daily.     . ARIPiprazole (ABILIFY) 20 MG tablet Take 20 mg by mouth at bedtime.     . baclofen (LIORESAL) 20 MG tablet Take 20 mg by mouth 3 (three) times daily.  5  . citalopram (CELEXA) 40 MG tablet Take 40 mg by mouth every morning.     . gabapentin (NEURONTIN) 600 MG tablet Take 1-3 tablets by mouth 3 (three) times daily. 600mg  in am, 600mg  in afternoon, 1800mg  at bedtime    . methylphenidate (RITALIN) 20 MG tablet Take 20 mg by mouth daily.  0  . pantoprazole (PROTONIX) 40 MG tablet Take 1 tablet (40 mg total) by mouth 2 (two) times daily before a meal. 60 tablet 5  . temazepam (RESTORIL) 30 MG capsule Take 30 mg by mouth at bedtime.     . traMADol (ULTRAM) 50 MG tablet Take 1 tablet (50 mg total) by mouth every 6 (six) hours as needed. (Patient taking differently: Take 100 mg by mouth 4 (four) times daily. ) 20 tablet 0   No current facility-administered medications for this visit.     Allergies as of 04/14/2018 - Review Complete 04/14/2018    Allergen Reaction Noted  . Compazine Shortness Of Breath 11/01/2011  . Imitrex [sumatriptan base] Swelling 10/18/2011  . Risperidone and related Other (See Comments) 12/21/2017  . Risperidone Rash 01/07/2008    Family History  Problem Relation Age of Onset  . Anesthesia problems Mother   . Colon cancer Maternal Grandfather   . Crohn's disease Maternal Grandfather   . Gastric cancer Paternal Uncle   . Esophageal cancer Neg Hx     Social History   Socioeconomic History  . Marital status: Divorced    Spouse name: Not on file  . Number of children: Not on file  . Years of education: Not on file  . Highest education level: Not on file  Occupational History  . Not on file  Social Needs  . Financial resource strain: Not on file  . Food insecurity:    Worry: Not on file    Inability: Not on file  . Transportation needs:    Medical: Not on file    Non-medical: Not on file  Tobacco  Use  . Smoking status: Current Every Day Smoker    Packs/day: 1.00    Years: 22.00    Pack years: 22.00    Types: Cigarettes  . Smokeless tobacco: Never Used  Substance and Sexual Activity  . Alcohol use: Yes    Comment: occ  . Drug use: No  . Sexual activity: Not on file  Lifestyle  . Physical activity:    Days per week: Not on file    Minutes per session: Not on file  . Stress: Not on file  Relationships  . Social connections:    Talks on phone: Not on file    Gets together: Not on file    Attends religious service: Not on file    Active member of club or organization: Not on file    Attends meetings of clubs or organizations: Not on file    Relationship status: Not on file  Other Topics Concern  . Not on file  Social History Narrative  . Not on file    Review of Systems: General: Negative for anorexia, weight loss, fever, chills, fatigue, weakness. ENT: Negative for hoarseness, difficulty swallowing. CV: Negative for chest pain, angina, palpitations, peripheral edema.   Respiratory: Negative for dyspnea at rest, cough, sputum, wheezing.  GI: See history of present illness. Endo: Negative for unusual weight change.  Heme: Negative for bruising or bleeding.   Physical Exam: BP (!) 141/82   Pulse 88   Temp (!) 97.4 F (36.3 C) (Oral)   Ht 6\' 3"  (1.905 m)   Wt (!) 309 lb 3.2 oz (140.3 kg)   BMI 38.65 kg/m  General:   Alert and oriented. Pleasant and cooperative. Well-nourished and well-developed.  Eyes:  Without icterus, sclera clear and conjunctiva pink.  Ears:  Normal auditory acuity. Cardiovascular:  S1, S2 present without murmurs appreciated. Extremities without clubbing or edema. Respiratory:  Clear to auscultation bilaterally. No wheezes, rales, or rhonchi. No distress.  Gastrointestinal:  +BS, soft, and non-distended. Mild left-sided and lower abdominal TTP. No HSM noted. No guarding or rebound. No masses appreciated.  Rectal:  Deferred  Musculoskalatal:  Symmetrical without gross deformities. Skin:  Intact without significant lesions or rashes. Neurologic:  Alert and oriented x4;  grossly normal neurologically. Psych:  Alert and cooperative. Normal mood and affect. Heme/Lymph/Immune: No excessive bruising noted.    04/14/2018 1:57 PM   Disclaimer: This note was dictated with voice recognition software. Similar sounding words can inadvertently be transcribed and may not be corrected upon review.

## 2018-04-14 NOTE — Assessment & Plan Note (Signed)
The patient notes rectal bleeding ranging from bright red to dark red.  This occurs with every bowel movement or as infrequently as every third bowel movement.  Denies chest pain, lightheadedness, dizziness, weakness, syncope, chest pain, dyspnea.  No symptoms otherwise of anemia.  He does have constipation.  Query benign anorectal source.  However, cannot rule out other more insidious pathology such as bleeding polyp, anal fissure, colorectal cancer.  At this point I will check a CBC to ensure he has not lost a significant amount of blood.  We will treat his constipation as per below.  We will set him up for colonoscopy to further evaluate.  Follow-up in 3 months.  Proceed with TCS on propofol/MAC with Dr. Gala Romney in near future: the risks, benefits, and alternatives have been discussed with the patient in detail. The patient states understanding and desires to proceed.  He is currently on Xanax, Abilify, Celexa, Neurontin, Ritalin, Restoril, Ultram.  Occasional alcohol use.  Denies drug use.  No other anticoagulants, anxiolytics, chronic pain medications, or antidepressants.  We will plan for the procedure on propofol/MAC to promote adequate sedation.

## 2018-04-14 NOTE — Telephone Encounter (Signed)
Pre-op scheduled for 06/12/18 at 9:00am. Letter mailed. [email or web address]

## 2018-04-14 NOTE — Assessment & Plan Note (Signed)
GERD symptoms doing well.  Continue PPI twice daily.  He does have peptic ulcer disease on EGD.  I will send a message to Dr. Gala Romney to see if it is needed to repeat his EGD for ulcer healing.  Follow-up in 3 months.

## 2018-04-14 NOTE — Progress Notes (Signed)
cc'ed to pcp °

## 2018-04-14 NOTE — Patient Instructions (Signed)
1. I am providing you samples of Linzess 145 mcg.  Take 1 pill, once a day on an empty stomach. 2. As we discussed this may cause temporary diarrhea.  If it does occur, it typically resolves in a few days. 3. Call us in 1 to 2 weeks and let us know if it is helping. 4. Have your labs checked as soon as you can. 5. We will schedule your colonoscopy for you. 6. Further recommendations will be made based on your colonoscopy results. 7. Follow-up in 3 months. 8. Call us if you have any questions or concerns.  At [facility] we value your feedback. You may receive a survey about your visit today. Please share your experience as we strive to create trusting relationships with our patients to provide genuine, compassionate, quality care.  It was great to see you today!  I hope you have a great summer!!

## 2018-04-24 ENCOUNTER — Other Ambulatory Visit: Payer: Self-pay

## 2018-04-24 ENCOUNTER — Emergency Department (HOSPITAL_COMMUNITY)
Admission: EM | Admit: 2018-04-24 | Discharge: 2018-04-24 | Disposition: A | Discharge status: Home / Self Care | Payer: Medicare HMO | Attending: Emergency Medicine | Admitting: Emergency Medicine

## 2018-04-24 ENCOUNTER — Encounter (HOSPITAL_COMMUNITY): Payer: Self-pay | Admitting: Emergency Medicine

## 2018-04-24 DIAGNOSIS — J45909 Unspecified asthma, uncomplicated: Secondary | ICD-10-CM | POA: Diagnosis not present

## 2018-04-24 DIAGNOSIS — H6123 Impacted cerumen, bilateral: Secondary | ICD-10-CM

## 2018-04-24 DIAGNOSIS — F17210 Nicotine dependence, cigarettes, uncomplicated: Secondary | ICD-10-CM | POA: Diagnosis not present

## 2018-04-24 DIAGNOSIS — Z79899 Other long term (current) drug therapy: Secondary | ICD-10-CM | POA: Insufficient documentation

## 2018-04-24 DIAGNOSIS — H9203 Otalgia, bilateral: Secondary | ICD-10-CM | POA: Diagnosis present

## 2018-04-24 MED ORDER — CARBAMIDE PEROXIDE 6.5 % OT SOLN: 5.0000 [drp] | Freq: Two times a day (BID) | OTIC | 0 refills | Status: DC

## 2018-04-24 MED ORDER — HYDROGEN PEROXIDE 3 % EX SOLN
CUTANEOUS | Status: AC
  Administered 2018-04-24: 17:00:00
  Filled 2018-04-24: qty 473

## 2018-04-24 NOTE — Discharge Instructions (Signed)
Continue to use Debrox drops in both ears daily, avoid using Q-tips or put anything in the ear as this will push wax back further.  Your ears may feel a bit irritated, but there was no evidence of ear infection on exam today.  Please follow-up with your primary care doctor as needed.  Return to the emergency department for fevers, severe ear pain or any other new or concerning symptoms.

## 2018-04-24 NOTE — ED Provider Notes (Signed)
[facility] EMERGENCY DEPARTMENT Provider Note   CSN: [id number] Arrival date & time: 04/24/18  1553     History   Chief Complaint Chief Complaint  Patient presents with  . Otalgia    HPI [name] is a 42 y.o. male.  [name] is a 42 y.o. Male with a history of migraines, GERD, hyperlipidemia, chronic back pain, schizoaffective disorder and anxiety, who presents to the emergency department for evaluation of bilateral ear pain, worse on right than left.  Patient reports for the past 2 to 3 days his ears have felt stopped up, and his hearing is especially muffled on the right side, patient is concerned that he has wax buildup.  He denies any drainage from the ears.  No fevers or chills.  He reports some mild nasal congestion and rhinorrhea consistent with his typical seasonal allergies.  Denies any sore throat or cough, no chest pain or shortness of breath.  Patient has not been using Q-tips or anything else in the ears has not tried anything to clear out the wax at home.      Past Medical History:  Diagnosis Date  . Anxiety   . Asthma   . Bipolar 1 disorder ([place])   . Chronic back pain   . Complication of anesthesia    Anxiety when he awaken with ET tube still in place  . GERD (gastroesophageal reflux disease)   . Hyperlipidemia   . Hypoventilation syndrome   . Left testicular torsion    "20 years ago"  . Lumbar radiculopathy   . Migraines   . Schizoaffective disorder ([place])    Day Mark    Patient Active Problem List   Diagnosis Date Noted  . Rectal bleeding 04/14/2018  . Dysphagia 12/24/2017  . Schizophrenia ([place]) 05/28/2016  . Abdominal pain, epigastric 02/04/2013  . Constipation 12/02/2012  . Abdominal pain 11/22/2012  . INSOMNIA 04/26/2009  . BACK PAIN, LUMBAR 03/20/2009  . BIPOLAR AFFECTIVE DISORDER 02/17/2009  . OTHER ENTHESOPATHY OF ANKLE AND TARSUS 10/05/2008  . GERD 09/16/2008  . PERSONAL HISTORY OF SCHIZOPHRENIA 05/27/2008  .  ESOPHAGITIS, REFLUX 03/23/2008  . HIATAL HERNIA 03/23/2008  . HYPERLIPIDEMIA 01/07/2008  . MORBID OBESITY 01/07/2008  . ANXIETY DEPRESSION 01/07/2008  . TOBACCO ABUSE 01/07/2008  . HYPERTENSION 01/07/2008  . ALLERGIC RHINITIS 01/07/2008  . IBS 01/07/2008    Past Surgical History:  Procedure Laterality Date  . ANKLE SURGERY    . APPENDECTOMY    . BIOPSY  01/15/2018   Procedure: BIOPSY;  Surgeon: Daneil Dolin, MD;  [facility];  Service: Endoscopy;;  gastric  . CHOLECYSTECTOMY    . ESOPHAGOGASTRODUODENOSCOPY  03/17/2008   RMR: A tiny distal esophageal erosions consistent with mild  erosive reflux esophagitis, otherwise normal esophagus, small hiatal hernia, minimally polypoid antral mucosa with doubtful clinical was significance.  Otherwise, normal stomach, patent pylorus, and normal D1-D2  . ESOPHAGOGASTRODUODENOSCOPY (EGD) WITH PROPOFOL N/A 02/18/2013   Procedure: ESOPHAGOGASTRODUODENOSCOPY (EGD) WITH PROPOFOL;  Surgeon: Daneil Dolin, MD;  [facility];  Service: Endoscopy;  Laterality: N/A;  . ESOPHAGOGASTRODUODENOSCOPY (EGD) WITH PROPOFOL N/A 01/15/2018   Procedure: ESOPHAGOGASTRODUODENOSCOPY (EGD) WITH PROPOFOL;  Surgeon: Daneil Dolin, MD;  [facility];  Service: Endoscopy;  Laterality: N/A;  2:15pm  . MALONEY DILATION N/A 01/15/2018   Procedure: Venia Minks DILATION;  Surgeon: Daneil Dolin, MD;  [facility];  Service: Endoscopy;  Laterality: N/A;  . NASAL SINUS SURGERY  01/24/2012  Procedure: ENDOSCOPIC SINUS SURGERY;  Surgeon: Izora Gala, MD;  Location: [place];  Service: ENT;  Laterality: Left;  left ethmoidectomy, maxillary, frontal  . UMBILICAL HERNIA REPAIR          Home Medications    Prior to Admission medications   Medication Sig Start Date End Date Taking? Authorizing Provider  ALPRAZolam Duanne Moron) 1 MG tablet Take 1 mg by mouth 4 (four) times daily.     [provider]  ARIPiprazole (ABILIFY) 20 MG tablet Take 20 mg by  mouth at bedtime.     [provider]  baclofen (LIORESAL) 20 MG tablet Take 20 mg by mouth 3 (three) times daily. 11/02/17   [provider]  citalopram (CELEXA) 40 MG tablet Take 40 mg by mouth every morning.     [provider]  gabapentin (NEURONTIN) 600 MG tablet Take 1-3 tablets by mouth 3 (three) times daily. 600mg  in am, 600mg  in afternoon, 1800mg  at bedtime 04/01/18   [provider]  methylphenidate (RITALIN) 20 MG tablet Take 20 mg by mouth daily. 10/28/17   [provider]  pantoprazole (PROTONIX) 40 MG tablet Take 1 tablet (40 mg total) by mouth 2 (two) times daily before a meal. 03/04/18   Mahala Menghini, PA-C  temazepam (RESTORIL) 30 MG capsule Take 30 mg by mouth at bedtime.  11/24/17   [provider]  traMADol (ULTRAM) 50 MG tablet Take 1 tablet (50 mg total) by mouth every 6 (six) hours as needed. Patient taking differently: Take 100 mg by mouth 4 (four) times daily.  03/02/18   Hayden Rasmussen, MD    Family History Family History  Problem Relation Age of Onset  . Anesthesia problems Mother   . Colon cancer Maternal Grandfather   . Crohn's disease Maternal Grandfather   . Gastric cancer Paternal Uncle   . Esophageal cancer Neg Hx     Social History Social History   Tobacco Use  . Smoking status: Current Every Day Smoker    Packs/day: 1.00    Years: 22.00    Pack years: 22.00    Types: Cigarettes  . Smokeless tobacco: Never Used  Substance Use Topics  . Alcohol use: Yes    Comment: occ  . Drug use: No     Allergies   Compazine; Imitrex [sumatriptan base]; Risperidone and related; and Risperidone   Review of Systems Review of Systems  Constitutional: Negative for chills and fever.  HENT: Positive for congestion, ear pain and rhinorrhea. Negative for ear discharge and sore throat.   Respiratory: Negative for cough and shortness of breath.   Gastrointestinal: Negative for nausea and vomiting.  Skin:  Negative for color change and rash.  Neurological: Negative for dizziness.     Physical Exam Updated Vital Signs BP (!) 134/98 (BP Location: Right Arm)   Pulse (!) 102   Temp 98.9 F (37.2 C) (Temporal)   Resp 18   Ht 6\' 3"  (1.905 m)   Wt 136.1 kg (300 lb)   SpO2 98%   BMI 37.50 kg/m   Physical Exam  Constitutional: He appears well-developed and well-nourished. No distress.  HENT:  Head: Normocephalic and atraumatic.  Mouth/Throat: Oropharynx is clear and moist.  Bilateral ears with cerumen impaction, right TM is completely occluded by wax, left TM is partially visualized, muffled hearing in the right ear, moderate nasal mucosa edema with clear rhinorrhea, posterior oropharynx clear and moist, with no erythema, no edema or exudates  Eyes: Right eye exhibits  no discharge. Left eye exhibits no discharge.  Neck: Neck supple.  Pulmonary/Chest: Effort normal. No respiratory distress.  Lymphadenopathy:    He has no cervical adenopathy.  Neurological: He is alert. Coordination normal.  Skin: Skin is warm and dry. He is not diaphoretic.  Psychiatric: He has a normal mood and affect. His behavior is normal.  Nursing note and vitals reviewed.    ED Treatments / Results  Labs (all labs ordered are listed, but only abnormal results are displayed) Labs Reviewed - No data to display  EKG None  Radiology No results found.  Procedures .Ear Cerumen Removal Date/Time: 04/24/2018 5:24 PM Performed by: Jacqlyn Larsen, PA-C Authorized by: Jacqlyn Larsen, PA-C   Consent:    Consent obtained:  Verbal   Consent given by:  Patient   Risks discussed:  Bleeding, infection, incomplete removal, pain and TM perforation   Alternatives discussed:  No treatment Procedure details:    Location:  L ear and R ear   Procedure type: curette   Post-procedure details:    Inspection:  TM intact   Hearing quality:  Normal   Patient tolerance of procedure:  Tolerated well, no immediate  complications   (including critical care time)  Medications Ordered in ED Medications  hydrogen peroxide 3 % external solution (has no administration in time range)     Initial Impression / Assessment and Plan / ED Course  I have reviewed the triage vital signs and the nursing notes.  Pertinent labs & imaging results that were available during my care of the patient were reviewed by me and considered in my medical decision making (see chart for details).  Patient presents to the emergency department for evaluation of bilateral otalgia with sensation in the ears are clogged up, he has had some mild allergic rhinitis symptoms, no fevers, no ear drainage.  On exam patient has bilateral cerumen impactions worse on the right than the left.  Bilateral ears were irrigated with saline, hydrogen peroxide solution and earwax removed with curette.  Hearing normalized and patient reports significant improvement in symptoms.  Counseled patient on avoiding Q-tips or anything in the ear.  Will provide prescription for Debrox drops.  Return precautions discussed.  Patient expresses understanding and is in agreement with plan.  Final Clinical Impressions(s) / ED Diagnoses   Final diagnoses:  Bilateral impacted cerumen    ED Discharge Orders        Ordered    carbamide peroxide (DEBROX) 6.5 % OTIC solution  2 times daily     04/24/18 1722       Jacqlyn Larsen, [place] 04/24/18 1727    Daleen Bo, MD 04/24/18 2332

## 2018-04-24 NOTE — ED Triage Notes (Signed)
Patient complaining of bilateral ears being stopped up x 2 days. Denies pain.

## 2018-04-28 DIAGNOSIS — Z7151 Drug abuse counseling and surveillance of drug abuser: Secondary | ICD-10-CM | POA: Diagnosis not present

## 2018-04-28 DIAGNOSIS — F1120 Opioid dependence, uncomplicated: Secondary | ICD-10-CM | POA: Diagnosis not present

## 2018-04-29 DIAGNOSIS — F200 Paranoid schizophrenia: Secondary | ICD-10-CM | POA: Diagnosis not present

## 2018-04-29 DIAGNOSIS — F419 Anxiety disorder, unspecified: Secondary | ICD-10-CM | POA: Diagnosis not present

## 2018-04-29 DIAGNOSIS — Z7151 Drug abuse counseling and surveillance of drug abuser: Secondary | ICD-10-CM | POA: Diagnosis not present

## 2018-04-29 DIAGNOSIS — F1120 Opioid dependence, uncomplicated: Secondary | ICD-10-CM | POA: Diagnosis not present

## 2018-05-05 ENCOUNTER — Other Ambulatory Visit: Payer: Self-pay | Admitting: *Deleted

## 2018-05-05 DIAGNOSIS — Z7151 Drug abuse counseling and surveillance of drug abuser: Secondary | ICD-10-CM | POA: Diagnosis not present

## 2018-05-05 DIAGNOSIS — F1120 Opioid dependence, uncomplicated: Secondary | ICD-10-CM | POA: Diagnosis not present

## 2018-05-05 NOTE — Patient Outreach (Addendum)
[place] [facility]) Care Management  05/05/2018  DEHAVEN SINE 20-Oct-1976 [id number]  RN attempted #1 call to patient 1st home number and mobile number. RN received service message stating The subscriber dialed is not in service. RN Health Coach attempted #1 screening outreach call to patient 2nd home number listed..  Patient was unavailable. Phone line busy. No answering service pickup. Plan: RN will call patient again within 3-5days.  [place] Care Management [phone]

## 2018-05-06 DIAGNOSIS — F1120 Opioid dependence, uncomplicated: Secondary | ICD-10-CM | POA: Diagnosis not present

## 2018-05-06 DIAGNOSIS — Z7151 Drug abuse counseling and surveillance of drug abuser: Secondary | ICD-10-CM | POA: Diagnosis not present

## 2018-05-06 DIAGNOSIS — F419 Anxiety disorder, unspecified: Secondary | ICD-10-CM | POA: Diagnosis not present

## 2018-05-08 ENCOUNTER — Other Ambulatory Visit: Payer: Self-pay | Admitting: *Deleted

## 2018-05-08 NOTE — Patient Outreach (Signed)
[facility] [facility]) Care Management  05/08/2018  [name] 1976/08/20 [id number]   RN attempted screening call to home and mobile number. Both number are not in service RN attempted outreach to 2nd home number and it is continuously busy.   Plan: RN will send unsuccessful outreach letter and attempt again in 3-5 business days.  [place] Care Management [phone]

## 2018-05-12 ENCOUNTER — Telehealth: Payer: Self-pay | Admitting: *Deleted

## 2018-05-12 NOTE — Telephone Encounter (Signed)
Called [employer] and spoke with Mae (PA rep intake). Was advised no PA required for TCS. Ref # [id number]

## 2018-05-13 ENCOUNTER — Other Ambulatory Visit: Payer: Self-pay | Admitting: *Deleted

## 2018-05-13 DIAGNOSIS — Z7151 Drug abuse counseling and surveillance of drug abuser: Secondary | ICD-10-CM | POA: Diagnosis not present

## 2018-05-13 DIAGNOSIS — F1120 Opioid dependence, uncomplicated: Secondary | ICD-10-CM | POA: Diagnosis not present

## 2018-05-13 NOTE — Patient Outreach (Signed)
[place] [facility]) Care Management  05/13/2018  [name] 08/27/1976 [id number]   RN attempted screening call to home and mobile number. Both number are not in service RN attempted outreach to 2nd home number and it is continuously busy.   Plan: RN will close case due to unable to contact.  [place] Care Management [phone]

## 2018-05-15 DIAGNOSIS — I10 Essential (primary) hypertension: Secondary | ICD-10-CM | POA: Diagnosis not present

## 2018-05-15 DIAGNOSIS — E7800 Pure hypercholesterolemia, unspecified: Secondary | ICD-10-CM | POA: Diagnosis not present

## 2018-05-15 DIAGNOSIS — G43909 Migraine, unspecified, not intractable, without status migrainosus: Secondary | ICD-10-CM | POA: Diagnosis not present

## 2018-05-15 DIAGNOSIS — R51 Headache: Secondary | ICD-10-CM | POA: Diagnosis not present

## 2018-05-15 DIAGNOSIS — Z79899 Other long term (current) drug therapy: Secondary | ICD-10-CM | POA: Diagnosis not present

## 2018-05-15 DIAGNOSIS — F17200 Nicotine dependence, unspecified, uncomplicated: Secondary | ICD-10-CM | POA: Diagnosis not present

## 2018-05-20 DIAGNOSIS — M545 Low back pain: Secondary | ICD-10-CM | POA: Diagnosis not present

## 2018-05-30 DIAGNOSIS — E299 Testicular dysfunction, unspecified: Secondary | ICD-10-CM | POA: Diagnosis not present

## 2018-05-30 DIAGNOSIS — N503 Cyst of epididymis: Secondary | ICD-10-CM | POA: Diagnosis not present

## 2018-05-30 DIAGNOSIS — N5082 Scrotal pain: Secondary | ICD-10-CM | POA: Diagnosis not present

## 2018-05-30 DIAGNOSIS — Z888 Allergy status to other drugs, medicaments and biological substances status: Secondary | ICD-10-CM | POA: Diagnosis not present

## 2018-05-30 DIAGNOSIS — F17210 Nicotine dependence, cigarettes, uncomplicated: Secondary | ICD-10-CM | POA: Diagnosis not present

## 2018-05-30 DIAGNOSIS — Z5321 Procedure and treatment not carried out due to patient leaving prior to being seen by health care provider: Secondary | ICD-10-CM | POA: Diagnosis not present

## 2018-05-30 DIAGNOSIS — R109 Unspecified abdominal pain: Secondary | ICD-10-CM | POA: Diagnosis not present

## 2018-06-08 DIAGNOSIS — R1110 Vomiting, unspecified: Secondary | ICD-10-CM | POA: Diagnosis not present

## 2018-06-08 DIAGNOSIS — G43019 Migraine without aura, intractable, without status migrainosus: Secondary | ICD-10-CM | POA: Diagnosis not present

## 2018-06-08 DIAGNOSIS — F17210 Nicotine dependence, cigarettes, uncomplicated: Secondary | ICD-10-CM | POA: Diagnosis not present

## 2018-06-08 DIAGNOSIS — R1084 Generalized abdominal pain: Secondary | ICD-10-CM | POA: Diagnosis not present

## 2018-06-08 DIAGNOSIS — E860 Dehydration: Secondary | ICD-10-CM | POA: Diagnosis not present

## 2018-06-08 DIAGNOSIS — R079 Chest pain, unspecified: Secondary | ICD-10-CM | POA: Diagnosis not present

## 2018-06-08 DIAGNOSIS — G8929 Other chronic pain: Secondary | ICD-10-CM | POA: Diagnosis not present

## 2018-06-08 DIAGNOSIS — I5030 Unspecified diastolic (congestive) heart failure: Secondary | ICD-10-CM | POA: Diagnosis not present

## 2018-06-08 DIAGNOSIS — E876 Hypokalemia: Secondary | ICD-10-CM | POA: Diagnosis not present

## 2018-06-08 DIAGNOSIS — K219 Gastro-esophageal reflux disease without esophagitis: Secondary | ICD-10-CM | POA: Diagnosis not present

## 2018-06-08 DIAGNOSIS — R51 Headache: Secondary | ICD-10-CM | POA: Diagnosis not present

## 2018-06-08 DIAGNOSIS — R000 Tachycardia, unspecified: Secondary | ICD-10-CM | POA: Diagnosis not present

## 2018-06-08 DIAGNOSIS — R0789 Other chest pain: Secondary | ICD-10-CM | POA: Diagnosis not present

## 2018-06-08 DIAGNOSIS — I5041 Acute combined systolic (congestive) and diastolic (congestive) heart failure: Secondary | ICD-10-CM | POA: Diagnosis not present

## 2018-06-08 DIAGNOSIS — E785 Hyperlipidemia, unspecified: Secondary | ICD-10-CM | POA: Diagnosis not present

## 2018-06-08 DIAGNOSIS — R61 Generalized hyperhidrosis: Secondary | ICD-10-CM | POA: Diagnosis not present

## 2018-06-08 DIAGNOSIS — F419 Anxiety disorder, unspecified: Secondary | ICD-10-CM | POA: Diagnosis not present

## 2018-06-09 DIAGNOSIS — I5030 Unspecified diastolic (congestive) heart failure: Secondary | ICD-10-CM | POA: Diagnosis not present

## 2018-06-10 DIAGNOSIS — R079 Chest pain, unspecified: Secondary | ICD-10-CM | POA: Diagnosis not present

## 2018-06-10 NOTE — Patient Instructions (Addendum)
[name]  06/10/2018     @PREFPERIOPPHARMACY @   Your procedure is scheduled on  06/18/2018 .  Report to [facility] at  615   A.M.  Call this number if you have problems the morning of surgery:  [phone]   Remember:  Do not eat or drink after midnight.  You may drink clear liquids until ( follow the instructions given to you) .  Clear liquids allowed are:                    Water, Juice (non-citric and without pulp), Carbonated beverages, Clear Tea, Black Coffee only, Plain Jell-O only, Gatorade and Plain Popsicles only    Take these medicines the morning of surgery with A SIP OF WATER  Xanax (if needed), baclofen, celexa, gabapentin, ritalin, protonix, ultram.    Do not wear jewelry, make-up or nail polish.  Do not wear lotions, powders, or perfumes, or deodorant.  Do not shave 48 hours prior to surgery.  Men may shave face and neck.  Do not bring valuables to the hospital.  [name] is not responsible for any belongings or valuables.  Contacts, dentures or bridgework may not be worn into surgery.  Leave your suitcase in the car.  After surgery it may be brought to your room.  For patients admitted to the hospital, discharge time will be determined by your treatment team.  Patients discharged the day of surgery will not be allowed to drive home.   Name and phone number of your driver:   family Special instructions:  Follow the diet and prep instructions given to you by Dr Roseanne Kaufman office.  Please read over the following fact sheets that you were given. Anesthesia Post-op Instructions and Care and Recovery After Surgery       Colonoscopy, Adult A colonoscopy is an exam to look at the large intestine. It is done to check for problems, such as:  Lumps (tumors).  Growths (polyps).  Swelling (inflammation).  Bleeding.  What happens before the procedure? Eating and drinking Follow instructions from your doctor about eating and drinking. These  instructions may include:  A few days before the procedure - follow a low-fiber diet. ? Avoid nuts. ? Avoid seeds. ? Avoid dried fruit. ? Avoid raw fruits. ? Avoid vegetables.  1-3 days before the procedure - follow a clear liquid diet. Avoid liquids that have red or purple dye. Drink only clear liquids, such as: ? Clear broth or bouillon. ? Black coffee or tea. ? Clear juice. ? Clear soft drinks or sports drinks. ? Gelatin dessert. ? Popsicles.  On the day of the procedure - do not eat or drink anything during the 2 hours before the procedure.  Bowel prep If you were prescribed an oral bowel prep:  Take it as told by your doctor. Starting the day before your procedure, you will need to drink a lot of liquid. The liquid will cause you to poop (have bowel movements) until your poop is almost clear or light green.  If your skin or butt gets irritated from diarrhea, you may: ? Wipe the area with wipes that have medicine in them, such as adult wet wipes with aloe and vitamin E. ? Put something on your skin that soothes the area, such as petroleum jelly.  If you throw up (vomit) while drinking the bowel prep, take a break for up to 60 minutes. Then begin the bowel prep again. If  you keep throwing up and you cannot take the bowel prep without throwing up, call your doctor.  General instructions  Ask your doctor about changing or stopping your normal medicines. This is important if you take diabetes medicines or blood thinners.  Plan to have someone take you home from the hospital or clinic. What happens during the procedure?  An IV tube may be put into one of your veins.  You will be given medicine to help you relax (sedative).  To reduce your risk of infection: ? Your doctors will wash their hands. ? Your anal area will be washed with soap.  You will be asked to lie on your side with your knees bent.  Your doctor will get a long, thin, flexible tube ready. The tube will have  a camera and a light on the end.  The tube will be put into your anus.  The tube will be gently put into your large intestine.  Air will be delivered into your large intestine to keep it open. You may feel some pressure or cramping.  The camera will be used to take photos.  A small tissue sample may be removed from your body to be looked at under a microscope (biopsy). If any possible problems are found, the tissue will be sent to a lab for testing.  If small growths are found, your doctor may remove them and have them checked for cancer.  The tube that was put into your anus will be slowly removed. The procedure may vary among doctors and hospitals. What happens after the procedure?  Your doctor will check on you often until the medicines you were given have worn off.  Do not drive for 24 hours after the procedure.  You may have a small amount of blood in your poop.  You may pass gas.  You may have mild cramps or bloating in your belly (abdomen).  It is up to you to get the results of your procedure. Ask your doctor, or the department performing the procedure, when your results will be ready. This information is not intended to replace advice given to you by your health care provider. Make sure you discuss any questions you have with your health care provider. Document Released: 11/30/2010 Document Revised: 08/28/2016 Document Reviewed: 01/09/2016 Elsevier Interactive Patient Education  2017 [employer]  Colonoscopy, Adult, Care After This sheet gives you information about how to care for yourself after your procedure. Your health care provider may also give you more specific instructions. If you have problems or questions, contact your health care provider. What can I expect after the procedure? After the procedure, it is common to have:  A small amount of blood in your stool for 24 hours after the procedure.  Some gas.  Mild abdominal cramping or bloating.  Follow  these instructions at home: General instructions   For the first 24 hours after the procedure: ? Do not drive or use machinery. ? Do not sign important documents. ? Do not drink alcohol. ? Do your regular daily activities at a slower pace than normal. ? Eat soft, easy-to-digest foods. ? Rest often.  Take over-the-counter or prescription medicines only as told by your health care provider.  It is up to you to get the results of your procedure. Ask your health care provider, or the department performing the procedure, when your results will be ready. Relieving cramping and bloating  Try walking around when you have cramps or feel bloated.  Apply heat  to your abdomen as told by your health care provider. Use a heat source that your health care provider recommends, such as a moist heat pack or a heating pad. ? Place a towel between your skin and the heat source. ? Leave the heat on for 20-30 minutes. ? Remove the heat if your skin turns bright red. This is especially important if you are unable to feel pain, heat, or cold. You may have a greater risk of getting burned. Eating and drinking  Drink enough fluid to keep your urine clear or pale yellow.  Resume your normal diet as instructed by your health care provider. Avoid heavy or fried foods that are hard to digest.  Avoid drinking alcohol for as long as instructed by your health care provider. Contact a health care provider if:  You have blood in your stool 2-3 days after the procedure. Get help right away if:  You have more than a small spotting of blood in your stool.  You pass large blood clots in your stool.  Your abdomen is swollen.  You have nausea or vomiting.  You have a fever.  You have increasing abdominal pain that is not relieved with medicine. This information is not intended to replace advice given to you by your health care provider. Make sure you discuss any questions you have with your health care  provider. Document Released: 06/11/2004 Document Revised: 07/22/2016 Document Reviewed: 01/09/2016 Elsevier Interactive Patient Education  2018 [place] Anesthesia is a term that refers to techniques, procedures, and medicines that help a person stay safe and comfortable during a medical procedure. Monitored anesthesia care, or sedation, is one type of anesthesia. Your anesthesia specialist may recommend sedation if you will be having a procedure that does not require you to be unconscious, such as:  Cataract surgery.  A dental procedure.  A biopsy.  A colonoscopy.  During the procedure, you may receive a medicine to help you relax (sedative). There are three levels of sedation:  Mild sedation. At this level, you may feel awake and relaxed. You will be able to follow directions.  Moderate sedation. At this level, you will be sleepy. You may not remember the procedure.  Deep sedation. At this level, you will be asleep. You will not remember the procedure.  The more medicine you are given, the deeper your level of sedation will be. Depending on how you respond to the procedure, the anesthesia specialist may change your level of sedation or the type of anesthesia to fit your needs. An anesthesia specialist will monitor you closely during the procedure. Let your health care provider know about:  Any allergies you have.  All medicines you are taking, including vitamins, herbs, eye drops, creams, and over-the-counter medicines.  Any use of steroids (by mouth or as a cream).  Any problems you or family members have had with sedatives and anesthetic medicines.  Any blood disorders you have.  Any surgeries you have had.  Any medical conditions you have, such as sleep apnea.  Whether you are pregnant or may be pregnant.  Any use of cigarettes, alcohol, or street drugs. What are the risks? Generally, this is a safe procedure. However, problems may  occur, including:  Getting too much medicine (oversedation).  Nausea.  Allergic reaction to medicines.  Trouble breathing. If this happens, a breathing tube may be used to help with breathing. It will be removed when you are awake and breathing on your own.  Heart  trouble.  Lung trouble.  Before the procedure Staying hydrated Follow instructions from your health care provider about hydration, which may include:  Up to 2 hours before the procedure - you may continue to drink clear liquids, such as water, clear fruit juice, black coffee, and plain tea.  Eating and drinking restrictions Follow instructions from your health care provider about eating and drinking, which may include:  8 hours before the procedure - stop eating heavy meals or foods such as meat, fried foods, or fatty foods.  6 hours before the procedure - stop eating light meals or foods, such as toast or cereal.  6 hours before the procedure - stop drinking milk or drinks that contain milk.  2 hours before the procedure - stop drinking clear liquids.  Medicines Ask your health care provider about:  Changing or stopping your regular medicines. This is especially important if you are taking diabetes medicines or blood thinners.  Taking medicines such as aspirin and ibuprofen. These medicines can thin your blood. Do not take these medicines before your procedure if your health care provider instructs you not to.  Tests and exams  You will have a physical exam.  You may have blood tests done to show: ? How well your kidneys and liver are working. ? How well your blood can clot.  General instructions  Plan to have someone take you home from the hospital or clinic.  If you will be going home right after the procedure, plan to have someone with you for 24 hours.  What happens during the procedure?  Your blood pressure, heart rate, breathing, level of pain and overall condition will be monitored.  An IV  tube will be inserted into one of your veins.  Your anesthesia specialist will give you medicines as needed to keep you comfortable during the procedure. This may mean changing the level of sedation.  The procedure will be performed. After the procedure  Your blood pressure, heart rate, breathing rate, and blood oxygen level will be monitored until the medicines you were given have worn off.  Do not drive for 24 hours if you received a sedative.  You may: ? Feel sleepy, clumsy, or nauseous. ? Feel forgetful about what happened after the procedure. ? Have a sore throat if you had a breathing tube during the procedure. ? Vomit. This information is not intended to replace advice given to you by your health care provider. Make sure you discuss any questions you have with your health care provider. Document Released: 07/24/2005 Document Revised: 04/05/2016 Document Reviewed: 02/18/2016 Elsevier Interactive Patient Education  2018 [place], Care After These instructions provide you with information about caring for yourself after your procedure. Your health care provider may also give you more specific instructions. Your treatment has been planned according to current medical practices, but problems sometimes occur. Call your health care provider if you have any problems or questions after your procedure. What can I expect after the procedure? After your procedure, it is common to:  Feel sleepy for several hours.  Feel clumsy and have poor balance for several hours.  Feel forgetful about what happened after the procedure.  Have poor judgment for several hours.  Feel nauseous or vomit.  Have a sore throat if you had a breathing tube during the procedure.  Follow these instructions at home: For at least 24 hours after the procedure:   Do not: ? Participate in activities in which you could fall or  become injured. ? Drive. ? Use heavy  machinery. ? Drink alcohol. ? Take sleeping pills or medicines that cause drowsiness. ? Make important decisions or sign legal documents. ? Take care of children on your own.  Rest. Eating and drinking  Follow the diet that is recommended by your health care provider.  If you vomit, drink water, juice, or soup when you can drink without vomiting.  Make sure you have little or no nausea before eating solid foods. General instructions  Have a responsible adult stay with you until you are awake and alert.  Take over-the-counter and prescription medicines only as told by your health care provider.  If you smoke, do not smoke without supervision.  Keep all follow-up visits as told by your health care provider. This is important. Contact a health care provider if:  You keep feeling nauseous or you keep vomiting.  You feel light-headed.  You develop a rash.  You have a fever. Get help right away if:  You have trouble breathing. This information is not intended to replace advice given to you by your health care provider. Make sure you discuss any questions you have with your health care provider. Document Released: 02/18/2016 Document Revised: 06/19/2016 Document Reviewed: 02/18/2016 Elsevier Interactive Patient Education  [employer].

## 2018-06-11 ENCOUNTER — Encounter: Payer: Self-pay | Admitting: Internal Medicine

## 2018-06-12 ENCOUNTER — Telehealth: Payer: Self-pay | Admitting: Internal Medicine

## 2018-06-12 ENCOUNTER — Encounter (HOSPITAL_COMMUNITY)
Admission: RE | Admit: 2018-06-12 | Discharge: 2018-06-12 | Disposition: A | Discharge status: Home / Self Care | Payer: Medicare HMO | Source: Ambulatory Visit | Attending: Internal Medicine | Admitting: Internal Medicine

## 2018-06-12 ENCOUNTER — Encounter (HOSPITAL_COMMUNITY): Payer: Self-pay

## 2018-06-12 NOTE — Telephone Encounter (Signed)
Ex-girlfriend [name]) is also listed as emergency contact. Only number listed on DPR is number in chart. No one listed on DPR.

## 2018-06-12 NOTE — Telephone Encounter (Signed)
Pt was a no show for his pre op and the number listed is his ex girlfriend's and he doesn't live there anymore

## 2018-06-15 NOTE — Telephone Encounter (Signed)
Unable to contact pt. Endo scheduler cancelled procedure for 06/18/18 d/t no show for pre-op appt.

## 2018-06-15 NOTE — Telephone Encounter (Signed)
Tried to call number listed in chart, busy signal.

## 2018-06-18 ENCOUNTER — Encounter (HOSPITAL_COMMUNITY): Admission: RE | Payer: Self-pay | Source: Ambulatory Visit

## 2018-06-18 ENCOUNTER — Ambulatory Visit (HOSPITAL_COMMUNITY): Admission: RE | Admit: 2018-06-18 | Payer: Medicare HMO | Source: Ambulatory Visit | Admitting: Internal Medicine

## 2018-06-18 DIAGNOSIS — R079 Chest pain, unspecified: Secondary | ICD-10-CM | POA: Diagnosis not present

## 2018-06-18 DIAGNOSIS — I509 Heart failure, unspecified: Secondary | ICD-10-CM | POA: Diagnosis not present

## 2018-06-18 DIAGNOSIS — F209 Schizophrenia, unspecified: Secondary | ICD-10-CM | POA: Diagnosis not present

## 2018-06-18 DIAGNOSIS — Z8669 Personal history of other diseases of the nervous system and sense organs: Secondary | ICD-10-CM | POA: Diagnosis not present

## 2018-06-18 DIAGNOSIS — Z9889 Other specified postprocedural states: Secondary | ICD-10-CM | POA: Diagnosis not present

## 2018-06-18 DIAGNOSIS — R072 Precordial pain: Secondary | ICD-10-CM | POA: Diagnosis not present

## 2018-06-18 DIAGNOSIS — M549 Dorsalgia, unspecified: Secondary | ICD-10-CM | POA: Diagnosis not present

## 2018-06-18 DIAGNOSIS — Z9089 Acquired absence of other organs: Secondary | ICD-10-CM | POA: Diagnosis not present

## 2018-06-18 DIAGNOSIS — R45851 Suicidal ideations: Secondary | ICD-10-CM | POA: Diagnosis not present

## 2018-06-18 DIAGNOSIS — Z888 Allergy status to other drugs, medicaments and biological substances status: Secondary | ICD-10-CM | POA: Diagnosis not present

## 2018-06-18 DIAGNOSIS — F6089 Other specific personality disorders: Secondary | ICD-10-CM | POA: Diagnosis not present

## 2018-06-18 DIAGNOSIS — I110 Hypertensive heart disease with heart failure: Secondary | ICD-10-CM | POA: Diagnosis not present

## 2018-06-18 DIAGNOSIS — Z8679 Personal history of other diseases of the circulatory system: Secondary | ICD-10-CM | POA: Diagnosis not present

## 2018-06-18 DIAGNOSIS — F250 Schizoaffective disorder, bipolar type: Secondary | ICD-10-CM | POA: Diagnosis not present

## 2018-06-18 DIAGNOSIS — F419 Anxiety disorder, unspecified: Secondary | ICD-10-CM | POA: Diagnosis not present

## 2018-06-18 DIAGNOSIS — R45850 Homicidal ideations: Secondary | ICD-10-CM | POA: Diagnosis not present

## 2018-06-18 DIAGNOSIS — Z9049 Acquired absence of other specified parts of digestive tract: Secondary | ICD-10-CM | POA: Diagnosis not present

## 2018-06-18 DIAGNOSIS — R4183 Borderline intellectual functioning: Secondary | ICD-10-CM | POA: Diagnosis not present

## 2018-06-18 DIAGNOSIS — K219 Gastro-esophageal reflux disease without esophagitis: Secondary | ICD-10-CM | POA: Diagnosis not present

## 2018-06-18 SURGERY — COLONOSCOPY WITH PROPOFOL
Anesthesia: Monitor Anesthesia Care

## 2018-06-18 NOTE — Telephone Encounter (Signed)
Noted  

## 2018-06-23 DIAGNOSIS — Z8679 Personal history of other diseases of the circulatory system: Secondary | ICD-10-CM | POA: Diagnosis not present

## 2018-06-23 DIAGNOSIS — F209 Schizophrenia, unspecified: Secondary | ICD-10-CM | POA: Diagnosis not present

## 2018-06-23 DIAGNOSIS — Z9049 Acquired absence of other specified parts of digestive tract: Secondary | ICD-10-CM | POA: Diagnosis not present

## 2018-06-23 DIAGNOSIS — Z9089 Acquired absence of other organs: Secondary | ICD-10-CM | POA: Diagnosis not present

## 2018-06-23 DIAGNOSIS — R079 Chest pain, unspecified: Secondary | ICD-10-CM | POA: Diagnosis not present

## 2018-06-23 DIAGNOSIS — Z9889 Other specified postprocedural states: Secondary | ICD-10-CM | POA: Diagnosis not present

## 2018-06-23 DIAGNOSIS — Z8669 Personal history of other diseases of the nervous system and sense organs: Secondary | ICD-10-CM | POA: Diagnosis not present

## 2018-06-23 DIAGNOSIS — K219 Gastro-esophageal reflux disease without esophagitis: Secondary | ICD-10-CM | POA: Diagnosis not present

## 2018-06-23 DIAGNOSIS — R072 Precordial pain: Secondary | ICD-10-CM | POA: Diagnosis not present

## 2018-06-23 DIAGNOSIS — F419 Anxiety disorder, unspecified: Secondary | ICD-10-CM | POA: Diagnosis not present

## 2018-06-29 DIAGNOSIS — Z720 Tobacco use: Secondary | ICD-10-CM | POA: Diagnosis not present

## 2018-06-29 DIAGNOSIS — R0600 Dyspnea, unspecified: Secondary | ICD-10-CM | POA: Diagnosis not present

## 2018-06-29 DIAGNOSIS — R0989 Other specified symptoms and signs involving the circulatory and respiratory systems: Secondary | ICD-10-CM | POA: Diagnosis not present

## 2018-06-29 DIAGNOSIS — I509 Heart failure, unspecified: Secondary | ICD-10-CM | POA: Diagnosis not present

## 2018-06-29 DIAGNOSIS — R079 Chest pain, unspecified: Secondary | ICD-10-CM | POA: Diagnosis not present

## 2018-06-30 DIAGNOSIS — F209 Schizophrenia, unspecified: Secondary | ICD-10-CM | POA: Diagnosis not present

## 2018-06-30 DIAGNOSIS — Z049 Encounter for examination and observation for unspecified reason: Secondary | ICD-10-CM | POA: Diagnosis not present

## 2018-06-30 DIAGNOSIS — I509 Heart failure, unspecified: Secondary | ICD-10-CM | POA: Diagnosis not present

## 2018-06-30 DIAGNOSIS — R079 Chest pain, unspecified: Secondary | ICD-10-CM | POA: Diagnosis not present

## 2018-06-30 DIAGNOSIS — Z888 Allergy status to other drugs, medicaments and biological substances status: Secondary | ICD-10-CM | POA: Diagnosis not present

## 2018-06-30 DIAGNOSIS — E782 Mixed hyperlipidemia: Secondary | ICD-10-CM | POA: Diagnosis not present

## 2018-06-30 DIAGNOSIS — R072 Precordial pain: Secondary | ICD-10-CM | POA: Diagnosis not present

## 2018-06-30 DIAGNOSIS — Z9889 Other specified postprocedural states: Secondary | ICD-10-CM | POA: Diagnosis not present

## 2018-06-30 DIAGNOSIS — M5440 Lumbago with sciatica, unspecified side: Secondary | ICD-10-CM | POA: Diagnosis not present

## 2018-06-30 DIAGNOSIS — E7800 Pure hypercholesterolemia, unspecified: Secondary | ICD-10-CM | POA: Diagnosis not present

## 2018-06-30 DIAGNOSIS — F419 Anxiety disorder, unspecified: Secondary | ICD-10-CM | POA: Diagnosis not present

## 2018-06-30 DIAGNOSIS — Z590 Homelessness: Secondary | ICD-10-CM | POA: Diagnosis not present

## 2018-06-30 DIAGNOSIS — R45851 Suicidal ideations: Secondary | ICD-10-CM | POA: Diagnosis not present

## 2018-06-30 DIAGNOSIS — Z01818 Encounter for other preprocedural examination: Secondary | ICD-10-CM | POA: Diagnosis not present

## 2018-06-30 DIAGNOSIS — F250 Schizoaffective disorder, bipolar type: Secondary | ICD-10-CM | POA: Diagnosis not present

## 2018-06-30 DIAGNOSIS — Z599 Problem related to housing and economic circumstances, unspecified: Secondary | ICD-10-CM | POA: Diagnosis not present

## 2018-06-30 DIAGNOSIS — I110 Hypertensive heart disease with heart failure: Secondary | ICD-10-CM | POA: Diagnosis not present

## 2018-06-30 DIAGNOSIS — K219 Gastro-esophageal reflux disease without esophagitis: Secondary | ICD-10-CM | POA: Diagnosis not present

## 2018-06-30 DIAGNOSIS — F200 Paranoid schizophrenia: Secondary | ICD-10-CM | POA: Diagnosis not present

## 2018-06-30 DIAGNOSIS — Z9089 Acquired absence of other organs: Secondary | ICD-10-CM | POA: Diagnosis not present

## 2018-07-07 DIAGNOSIS — N50811 Right testicular pain: Secondary | ICD-10-CM | POA: Diagnosis not present

## 2018-07-07 DIAGNOSIS — R238 Other skin changes: Secondary | ICD-10-CM | POA: Diagnosis not present

## 2018-07-07 DIAGNOSIS — Z87891 Personal history of nicotine dependence: Secondary | ICD-10-CM | POA: Diagnosis not present

## 2018-07-07 DIAGNOSIS — N50819 Testicular pain, unspecified: Secondary | ICD-10-CM | POA: Diagnosis not present

## 2018-07-07 DIAGNOSIS — Z888 Allergy status to other drugs, medicaments and biological substances status: Secondary | ICD-10-CM | POA: Diagnosis not present

## 2018-07-07 DIAGNOSIS — Z5321 Procedure and treatment not carried out due to patient leaving prior to being seen by health care provider: Secondary | ICD-10-CM | POA: Diagnosis not present

## 2018-07-07 DIAGNOSIS — Z886 Allergy status to analgesic agent status: Secondary | ICD-10-CM | POA: Diagnosis not present

## 2018-07-09 DIAGNOSIS — G4700 Insomnia, unspecified: Secondary | ICD-10-CM | POA: Diagnosis not present

## 2018-07-09 DIAGNOSIS — F6089 Other specific personality disorders: Secondary | ICD-10-CM | POA: Diagnosis not present

## 2018-07-09 DIAGNOSIS — F251 Schizoaffective disorder, depressive type: Secondary | ICD-10-CM | POA: Diagnosis not present

## 2018-07-09 DIAGNOSIS — Z915 Personal history of self-harm: Secondary | ICD-10-CM | POA: Diagnosis not present

## 2018-07-09 DIAGNOSIS — M1990 Unspecified osteoarthritis, unspecified site: Secondary | ICD-10-CM | POA: Diagnosis not present

## 2018-07-09 DIAGNOSIS — F250 Schizoaffective disorder, bipolar type: Secondary | ICD-10-CM | POA: Diagnosis not present

## 2018-07-09 DIAGNOSIS — E876 Hypokalemia: Secondary | ICD-10-CM | POA: Diagnosis not present

## 2018-07-09 DIAGNOSIS — Z7982 Long term (current) use of aspirin: Secondary | ICD-10-CM | POA: Diagnosis not present

## 2018-07-09 DIAGNOSIS — E669 Obesity, unspecified: Secondary | ICD-10-CM | POA: Diagnosis not present

## 2018-07-09 DIAGNOSIS — I5020 Unspecified systolic (congestive) heart failure: Secondary | ICD-10-CM | POA: Diagnosis not present

## 2018-07-09 DIAGNOSIS — R45851 Suicidal ideations: Secondary | ICD-10-CM | POA: Diagnosis not present

## 2018-07-09 DIAGNOSIS — M5136 Other intervertebral disc degeneration, lumbar region: Secondary | ICD-10-CM | POA: Diagnosis not present

## 2018-07-09 DIAGNOSIS — K219 Gastro-esophageal reflux disease without esophagitis: Secondary | ICD-10-CM | POA: Diagnosis not present

## 2018-07-09 DIAGNOSIS — R001 Bradycardia, unspecified: Secondary | ICD-10-CM | POA: Diagnosis not present

## 2018-07-09 DIAGNOSIS — F17200 Nicotine dependence, unspecified, uncomplicated: Secondary | ICD-10-CM | POA: Diagnosis not present

## 2018-07-09 DIAGNOSIS — F419 Anxiety disorder, unspecified: Secondary | ICD-10-CM | POA: Diagnosis not present

## 2018-07-09 DIAGNOSIS — G8929 Other chronic pain: Secondary | ICD-10-CM | POA: Diagnosis not present

## 2018-07-09 DIAGNOSIS — F259 Schizoaffective disorder, unspecified: Secondary | ICD-10-CM | POA: Diagnosis not present

## 2018-07-09 DIAGNOSIS — R462 Strange and inexplicable behavior: Secondary | ICD-10-CM | POA: Diagnosis not present

## 2018-07-09 DIAGNOSIS — M545 Low back pain: Secondary | ICD-10-CM | POA: Diagnosis not present

## 2018-07-16 DIAGNOSIS — M549 Dorsalgia, unspecified: Secondary | ICD-10-CM | POA: Diagnosis not present

## 2018-07-16 DIAGNOSIS — I509 Heart failure, unspecified: Secondary | ICD-10-CM | POA: Diagnosis not present

## 2018-07-16 DIAGNOSIS — M5136 Other intervertebral disc degeneration, lumbar region: Secondary | ICD-10-CM | POA: Diagnosis not present

## 2018-07-16 DIAGNOSIS — G8929 Other chronic pain: Secondary | ICD-10-CM | POA: Diagnosis not present

## 2018-07-16 DIAGNOSIS — M545 Low back pain: Secondary | ICD-10-CM | POA: Diagnosis not present

## 2018-07-16 DIAGNOSIS — M5126 Other intervertebral disc displacement, lumbar region: Secondary | ICD-10-CM | POA: Diagnosis not present

## 2018-07-16 DIAGNOSIS — Z79899 Other long term (current) drug therapy: Secondary | ICD-10-CM | POA: Diagnosis not present

## 2018-07-18 DIAGNOSIS — I509 Heart failure, unspecified: Secondary | ICD-10-CM | POA: Diagnosis not present

## 2018-07-18 DIAGNOSIS — R000 Tachycardia, unspecified: Secondary | ICD-10-CM | POA: Diagnosis not present

## 2018-07-18 DIAGNOSIS — R0682 Tachypnea, not elsewhere classified: Secondary | ICD-10-CM | POA: Diagnosis not present

## 2018-07-18 DIAGNOSIS — R197 Diarrhea, unspecified: Secondary | ICD-10-CM | POA: Diagnosis not present

## 2018-07-18 DIAGNOSIS — R1110 Vomiting, unspecified: Secondary | ICD-10-CM | POA: Diagnosis not present

## 2018-07-18 DIAGNOSIS — R0602 Shortness of breath: Secondary | ICD-10-CM | POA: Diagnosis not present

## 2018-07-18 DIAGNOSIS — R1013 Epigastric pain: Secondary | ICD-10-CM | POA: Diagnosis not present

## 2018-07-18 DIAGNOSIS — Z79899 Other long term (current) drug therapy: Secondary | ICD-10-CM | POA: Diagnosis not present

## 2018-07-18 DIAGNOSIS — R079 Chest pain, unspecified: Secondary | ICD-10-CM | POA: Diagnosis not present

## 2018-07-21 ENCOUNTER — Ambulatory Visit: Payer: Self-pay | Admitting: Nurse Practitioner

## 2018-07-21 DIAGNOSIS — R1031 Right lower quadrant pain: Secondary | ICD-10-CM | POA: Diagnosis not present

## 2018-07-21 DIAGNOSIS — F209 Schizophrenia, unspecified: Secondary | ICD-10-CM | POA: Diagnosis not present

## 2018-07-21 DIAGNOSIS — K219 Gastro-esophageal reflux disease without esophagitis: Secondary | ICD-10-CM | POA: Diagnosis not present

## 2018-07-21 DIAGNOSIS — Z7982 Long term (current) use of aspirin: Secondary | ICD-10-CM | POA: Diagnosis not present

## 2018-07-21 DIAGNOSIS — E785 Hyperlipidemia, unspecified: Secondary | ICD-10-CM | POA: Diagnosis not present

## 2018-07-21 DIAGNOSIS — F329 Major depressive disorder, single episode, unspecified: Secondary | ICD-10-CM | POA: Diagnosis not present

## 2018-07-21 DIAGNOSIS — I110 Hypertensive heart disease with heart failure: Secondary | ICD-10-CM | POA: Diagnosis not present

## 2018-07-21 DIAGNOSIS — N503 Cyst of epididymis: Secondary | ICD-10-CM | POA: Diagnosis not present

## 2018-07-21 DIAGNOSIS — R1030 Lower abdominal pain, unspecified: Secondary | ICD-10-CM | POA: Diagnosis not present

## 2018-07-21 DIAGNOSIS — F17200 Nicotine dependence, unspecified, uncomplicated: Secondary | ICD-10-CM | POA: Diagnosis not present

## 2018-07-21 DIAGNOSIS — I509 Heart failure, unspecified: Secondary | ICD-10-CM | POA: Diagnosis not present

## 2018-07-22 ENCOUNTER — Ambulatory Visit: Payer: Medicare HMO | Admitting: Nurse Practitioner

## 2018-07-22 ENCOUNTER — Telehealth: Payer: Self-pay | Admitting: Nurse Practitioner

## 2018-07-22 ENCOUNTER — Encounter: Payer: Self-pay | Admitting: Internal Medicine

## 2018-07-22 NOTE — Telephone Encounter (Signed)
Noted  

## 2018-07-22 NOTE — Progress Notes (Deleted)
Referring Provider: Sinda Du, MD Primary Care Physician:  Sinda Du, MD Primary GI:  Dr. Gala Romney  No chief complaint on file.   HPI:   [name] is a 42 y.o. male who presents for follow-up on constipation and rectal bleeding.  Patient was last seen in our office 04/14/2018 for the same as well as GERD.  Status post cholecystectomy.  No GERD improvement with Prilosec, Protonix, Nexium.  Did well on Dexilant.  Has required a MiraLAX purge in the past.  At one point in [place] he was told they were going to "do something laparoscopic to help throwing up acid."  Has previously tried Nexium, Prilosec, Protonix, Dexilant, Zantac, Pepcid.  Had not tried AcipHex.  Had previously been recommended to take Protonix twice a day, Carafate as needed, EGD with possible dilation, complete GES, follow-up 3 months.  EGD completed 01/15/2018 which found normal esophagus status post dilation, erosive gastropathy, gastric ulcer status post biopsy, normal duodenum.  Recommended continue current medications.  Surgical pathology found the biopsy to be reactive gastropathy without H. Pylori.  Again recommended continue Protonix 40 mg twice daily.  At his last visit he noted still having constipation with a bowel movement every day to every other day but hard stools which require straining.  Occasional diarrhea and abdominal pain but no improvement in pain after a bowel movement.  Started seeing blood prior to his EGD and indicated may possibly need a colonoscopy.  Intermittent blood with his bowel movements as infrequently as every 3 bowel movements or sometimes every bowel movement.  Blood is bright red to dark red.  Unintentional weight loss of 8 pounds in 3 weeks subjectively but objectively has lost 17 pounds over the past 3 to 4 months.  Has nausea and vomiting variable frequency.  GERD doing better on Protonix.  No constipation medications currently.  Recommended Linzess 105 mcg with samples  and request a progress report, complete labs, schedule colonoscopy, follow-up in 3 months.  The patient was a no-show to his preop appointment.  His colonoscopy was canceled.  Today he states   Past Medical History:  Diagnosis Date  . Anxiety   . Asthma   . Bipolar 1 disorder ([place])   . Chronic back pain   . Complication of anesthesia    Anxiety when he awaken with ET tube still in place  . GERD (gastroesophageal reflux disease)   . Hyperlipidemia   . Hypoventilation syndrome   . Left testicular torsion    "20 years ago"  . Lumbar radiculopathy   . Migraines   . Schizoaffective disorder ([place])    [name]    Past Surgical History:  Procedure Laterality Date  . ANKLE SURGERY    . APPENDECTOMY    . BIOPSY  01/15/2018   Procedure: BIOPSY;  Surgeon: Daneil Dolin, MD;  [facility];  Service: Endoscopy;;  gastric  . CHOLECYSTECTOMY    . ESOPHAGOGASTRODUODENOSCOPY  03/17/2008   RMR: A tiny distal esophageal erosions consistent with mild  erosive reflux esophagitis, otherwise normal esophagus, small hiatal hernia, minimally polypoid antral mucosa with doubtful clinical was significance.  Otherwise, normal stomach, patent pylorus, and normal D1-D2  . ESOPHAGOGASTRODUODENOSCOPY (EGD) WITH PROPOFOL N/A 02/18/2013   Procedure: ESOPHAGOGASTRODUODENOSCOPY (EGD) WITH PROPOFOL;  Surgeon: Daneil Dolin, MD;  Location: [facility];  Service: Endoscopy;  Laterality: N/A;  . ESOPHAGOGASTRODUODENOSCOPY (EGD) WITH PROPOFOL N/A 01/15/2018   Procedure: ESOPHAGOGASTRODUODENOSCOPY (EGD) WITH PROPOFOL;  Surgeon: Daneil Dolin, MD;  [facility];  Service: Endoscopy;  Laterality: N/A;  2:15pm  . MALONEY DILATION N/A 01/15/2018   Procedure: Venia Minks DILATION;  Surgeon: Daneil Dolin, MD;  [facility]: [facility];  Service: Endoscopy;  Laterality: N/A;  . NASAL SINUS SURGERY  01/24/2012   Procedure: ENDOSCOPIC SINUS SURGERY;  Surgeon: Izora Gala, MD;  Location: [facility];  Service: ENT;   Laterality: Left;  left ethmoidectomy, maxillary, frontal  . UMBILICAL HERNIA REPAIR      Current Outpatient Medications  Medication Sig Dispense Refill  . ALPRAZolam (XANAX) 1 MG tablet Take 1 mg by mouth 4 (four) times daily.     . ARIPiprazole (ABILIFY) 20 MG tablet Take 20 mg by mouth at bedtime.     . baclofen (LIORESAL) 20 MG tablet Take 20 mg by mouth 3 (three) times daily.  5  . carbamide peroxide (DEBROX) 6.5 % OTIC solution Place 5 drops into both ears 2 (two) times daily. 15 mL 0  . citalopram (CELEXA) 40 MG tablet Take 40 mg by mouth every morning.     . gabapentin (NEURONTIN) 600 MG tablet Take 1-3 tablets by mouth 3 (three) times daily. 600mg  in am, 600mg  in afternoon, 1800mg  at bedtime    . methylphenidate (RITALIN) 20 MG tablet Take 20 mg by mouth daily.  0  . pantoprazole (PROTONIX) 40 MG tablet Take 1 tablet (40 mg total) by mouth 2 (two) times daily before a meal. 60 tablet 5  . temazepam (RESTORIL) 30 MG capsule Take 30 mg by mouth at bedtime.     . traMADol (ULTRAM) 50 MG tablet Take 1 tablet (50 mg total) by mouth every 6 (six) hours as needed. (Patient taking differently: Take 100 mg by mouth 4 (four) times daily. ) 20 tablet 0   No current facility-administered medications for this visit.     Allergies as of 07/22/2018 - Review Complete 04/24/2018  Allergen Reaction Noted  . Compazine Shortness Of Breath 11/01/2011  . Imitrex [sumatriptan base] Swelling 10/18/2011  . Risperidone and related Other (See Comments) 12/21/2017  . Risperidone Rash 01/07/2008    Family History  Problem Relation Age of Onset  . Anesthesia problems Mother   . Colon cancer Maternal Grandfather   . Crohn's disease Maternal Grandfather   . Gastric cancer Paternal Uncle   . Esophageal cancer Neg Hx     Social History   Socioeconomic History  . Marital status: Divorced    Spouse name: Not on file  . Number of children: Not on file  . Years of education: Not on file  . Highest  education level: Not on file  Occupational History  . Not on file  Social Needs  . Financial resource strain: Not on file  . Food insecurity:    Worry: Not on file    Inability: Not on file  . Transportation needs:    Medical: Not on file    Non-medical: Not on file  Tobacco Use  . Smoking status: Current Every Day Smoker    Packs/day: 1.00    Years: 22.00    Pack years: 22.00    Types: Cigarettes  . Smokeless tobacco: Never Used  Substance and Sexual Activity  . Alcohol use: Yes    Comment: occ  . Drug use: No  . Sexual activity: Not on file  Lifestyle  . Physical activity:    Days per week: Not on file    Minutes per session: Not on file  . Stress: Not on  file  Relationships  . Social connections:    Talks on phone: Not on file    Gets together: Not on file    Attends religious service: Not on file    Active member of club or organization: Not on file    Attends meetings of clubs or organizations: Not on file    Relationship status: Not on file  Other Topics Concern  . Not on file  Social History Narrative  . Not on file    Review of Systems: General: Negative for anorexia, weight loss, fever, chills, fatigue, weakness. Eyes: Negative for vision changes.  ENT: Negative for hoarseness, difficulty swallowing , nasal congestion. CV: Negative for chest pain, angina, palpitations, dyspnea on exertion, peripheral edema.  Respiratory: Negative for dyspnea at rest, dyspnea on exertion, cough, sputum, wheezing.  GI: See history of present illness. GU:  Negative for dysuria, hematuria, urinary incontinence, urinary frequency, nocturnal urination.  MS: Negative for joint pain, low back pain.  Derm: Negative for rash or itching.  Neuro: Negative for weakness, abnormal sensation, seizure, frequent headaches, memory loss, confusion.  Psych: Negative for anxiety, depression, suicidal ideation, hallucinations.  Endo: Negative for unusual weight change.  Heme: Negative for  bruising or bleeding. Allergy: Negative for rash or hives.   Physical Exam: There were no vitals taken for this visit. General:   Alert and oriented. Pleasant and cooperative. Well-nourished and well-developed.  Head:  Normocephalic and atraumatic. Eyes:  Without icterus, sclera clear and conjunctiva pink.  Ears:  Normal auditory acuity. Mouth:  No deformity or lesions, oral mucosa pink.  Throat/Neck:  Supple, without mass or thyromegaly. Cardiovascular:  S1, S2 present without murmurs appreciated. Normal pulses noted. Extremities without clubbing or edema. Respiratory:  Clear to auscultation bilaterally. No wheezes, rales, or rhonchi. No distress.  Gastrointestinal:  +BS, soft, non-tender and non-distended. No HSM noted. No guarding or rebound. No masses appreciated.  Rectal:  Deferred  Musculoskalatal:  Symmetrical without gross deformities. Normal posture. Skin:  Intact without significant lesions or rashes. Neurologic:  Alert and oriented x4;  grossly normal neurologically. Psych:  Alert and cooperative. Normal mood and affect. Heme/Lymph/Immune: No significant cervical adenopathy. No excessive bruising noted.    07/22/2018 9:43 AM   Disclaimer: This note was dictated with voice recognition software. Similar sounding words can inadvertently be transcribed and may not be corrected upon review.

## 2018-07-22 NOTE — Telephone Encounter (Signed)
PATIENT WAS A NO SHOW AND LETTER SENT  °

## 2018-07-27 DIAGNOSIS — J029 Acute pharyngitis, unspecified: Secondary | ICD-10-CM | POA: Diagnosis not present

## 2018-07-27 DIAGNOSIS — R590 Localized enlarged lymph nodes: Secondary | ICD-10-CM | POA: Diagnosis not present

## 2018-07-27 DIAGNOSIS — I110 Hypertensive heart disease with heart failure: Secondary | ICD-10-CM | POA: Diagnosis not present

## 2018-07-27 DIAGNOSIS — Z7982 Long term (current) use of aspirin: Secondary | ICD-10-CM | POA: Diagnosis not present

## 2018-07-27 DIAGNOSIS — E785 Hyperlipidemia, unspecified: Secondary | ICD-10-CM | POA: Diagnosis not present

## 2018-07-27 DIAGNOSIS — F17200 Nicotine dependence, unspecified, uncomplicated: Secondary | ICD-10-CM | POA: Diagnosis not present

## 2018-07-27 DIAGNOSIS — I509 Heart failure, unspecified: Secondary | ICD-10-CM | POA: Diagnosis not present

## 2018-07-27 DIAGNOSIS — R591 Generalized enlarged lymph nodes: Secondary | ICD-10-CM | POA: Diagnosis not present

## 2018-07-27 DIAGNOSIS — Z720 Tobacco use: Secondary | ICD-10-CM | POA: Diagnosis not present

## 2018-07-28 ENCOUNTER — Encounter (HOSPITAL_COMMUNITY): Payer: Self-pay | Admitting: Physical Therapy

## 2018-07-28 NOTE — Therapy (Signed)
[facility] [place], [place], [address] Phone: [phone]   Fax:  [phone]  Patient Details  Name: DRYDEN TAPLEY MRN: [id number] Date of Birth: 07/22/1976 Referring Provider:  No ref. provider found  Encounter Date: 07/28/2018  PHYSICAL THERAPY DISCHARGE SUMMARY  Visits from Start of Care: 2  Current functional level related to goals / functional outcomes:  Patient has not returned to PT- DC    Remaining deficits: Unable to assess    Education / Equipment: None  Plan: Patient agrees to discharge.  Patient goals were not met. Patient is being discharged due to not returning since the last visit.  ?????      Deniece Ree PT, DPT, CBIS  Supplemental Physical Therapist [facility]    Pager [phone] Acute Rehab Office [facility] [address] [place], [place], [address] Phone: [phone]   Fax:  [phone]

## 2018-09-25 DIAGNOSIS — K602 Anal fissure, unspecified: Secondary | ICD-10-CM | POA: Diagnosis not present

## 2018-09-25 DIAGNOSIS — K6289 Other specified diseases of anus and rectum: Secondary | ICD-10-CM | POA: Diagnosis not present

## 2018-09-25 IMAGING — US US ART/VEN ABD/PELV/SCROTUM DOPPLER LTD
1 series · 13 of 25 positions shown · non-contrast
Comparison: None.

CLINICAL DATA: Right scrotal region pain

EXAM:
SCROTAL ULTRASOUND
DOPPLER ULTRASOUND OF THE TESTICLES
TECHNIQUE: Complete ultrasound examination of the testicles, epididymis, and
other scrotal structures was performed. Color and spectral Doppler
ultrasound were also utilized to evaluate blood flow to the
testicles.

[Series 1: us art/ven abd/pelv/scrotum doppler ltd · 0.07mm/px · 13 of 56 slices shown]
[im 1/56]
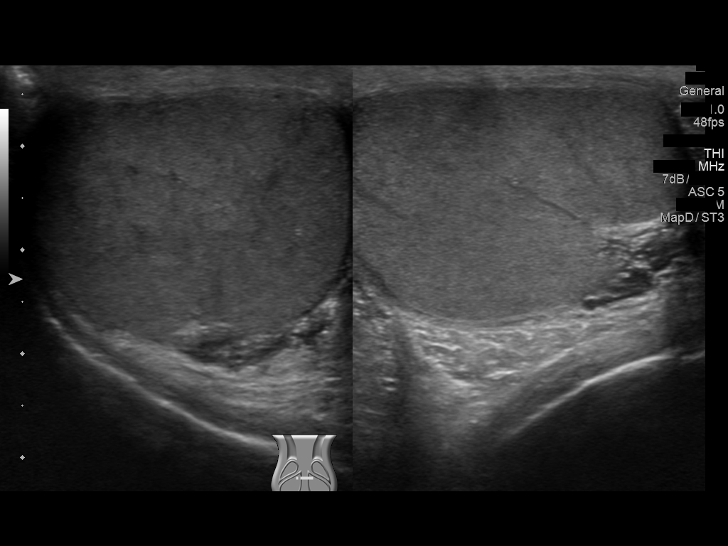
[im 5/56]
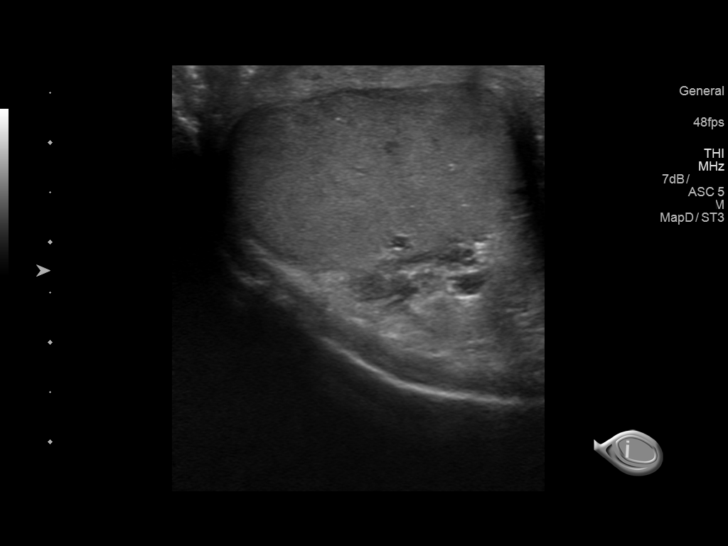
[im 10/56]
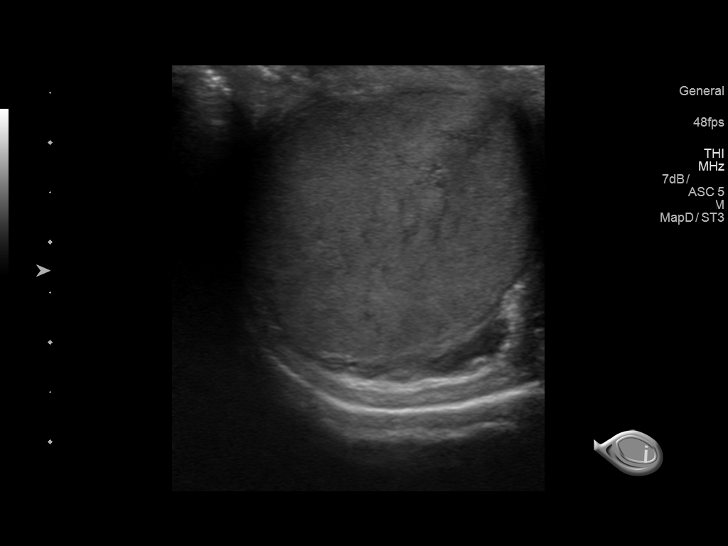
[im 14/56]
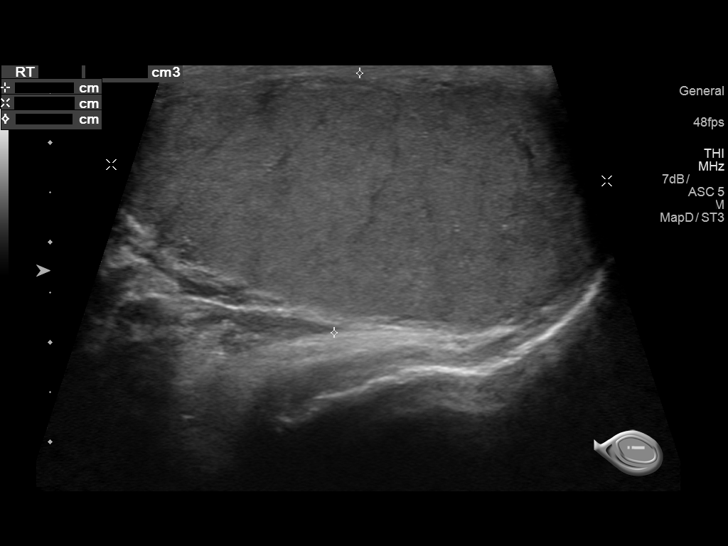
[im 19/56]
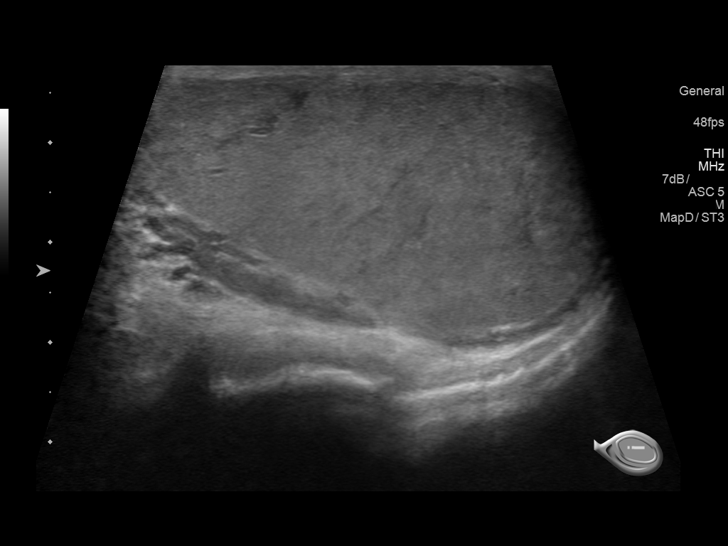
[im 23/56]
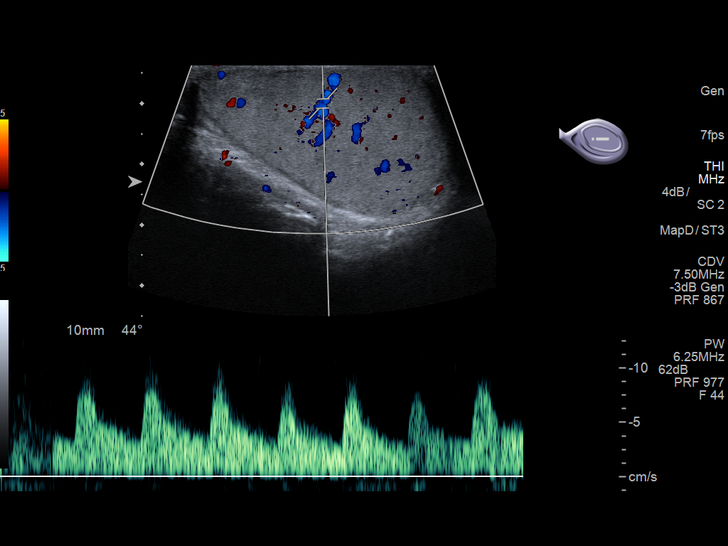
[im 28/56]
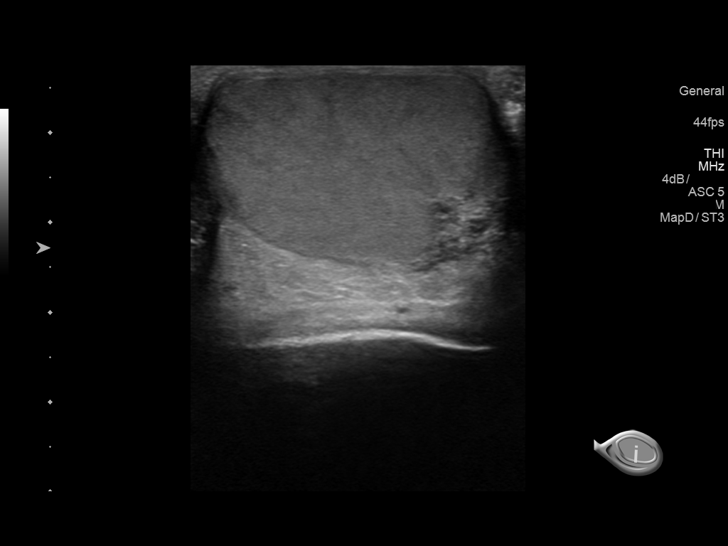
[im 33/56]
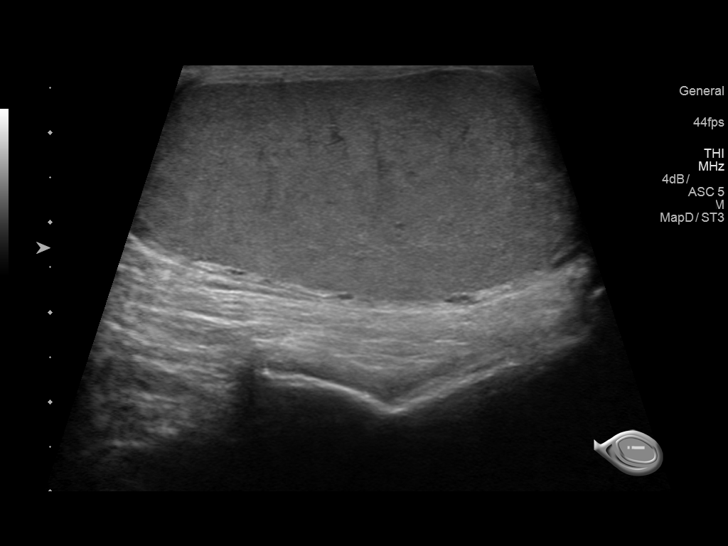
[im 37/56]
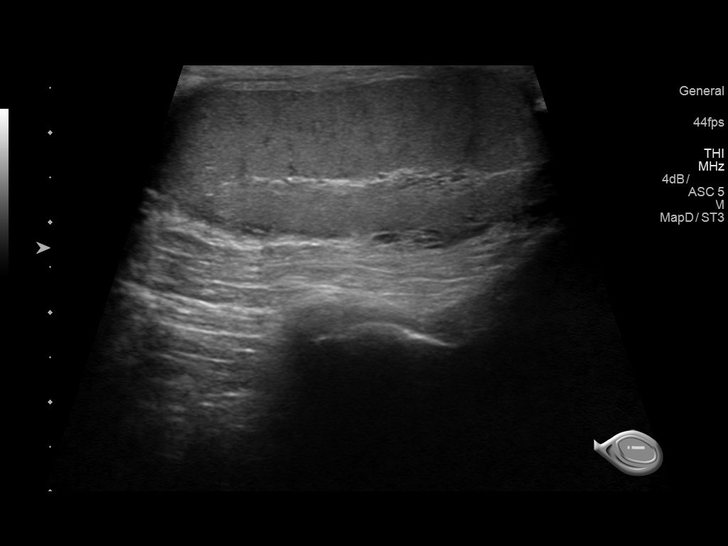
[im 42/56]
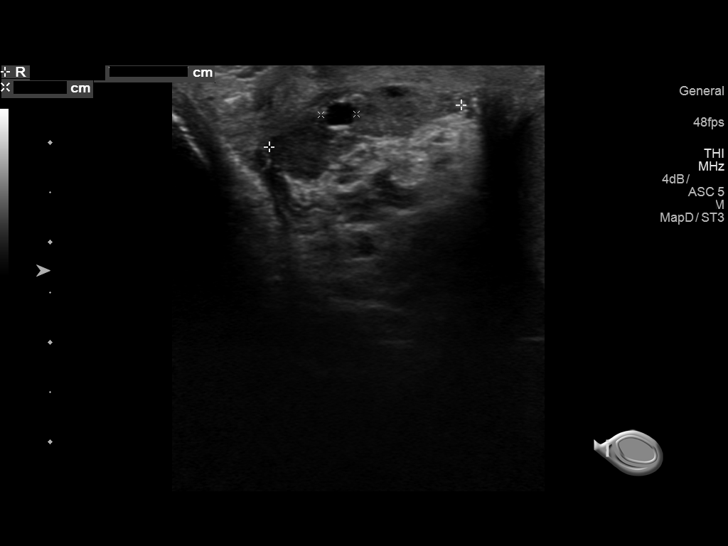
[im 46/56]
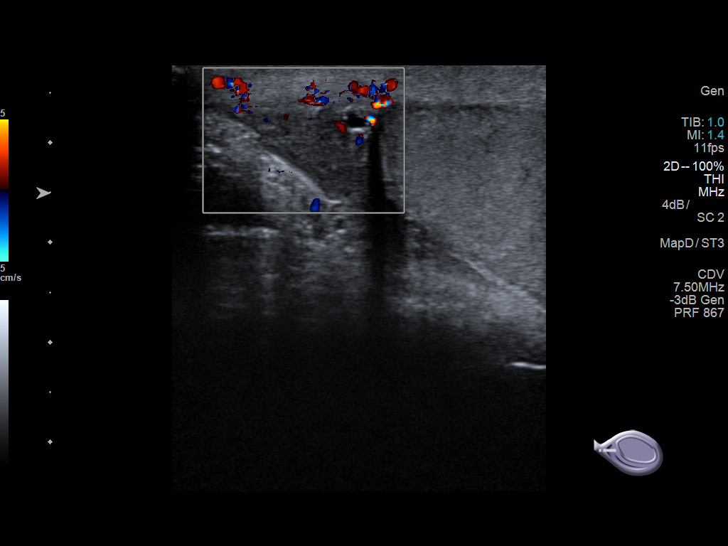
[im 51/56]
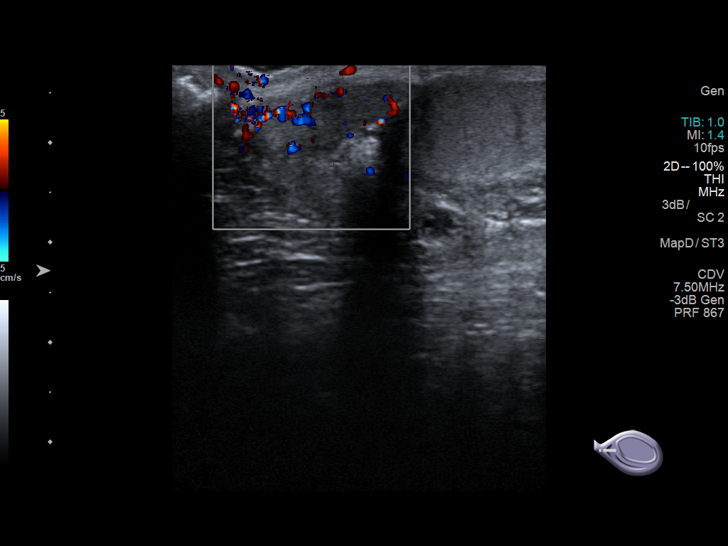
[im 56/56]
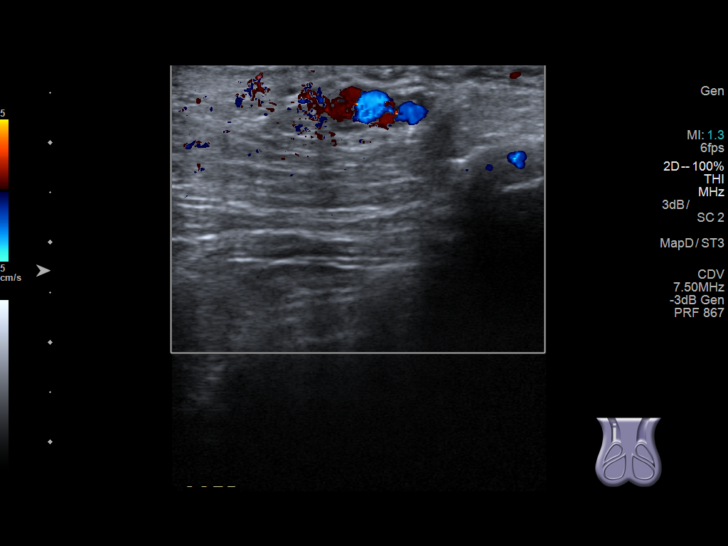

[13 of 25 positions shown; findings below may reference images not displayed]

FINDINGS: Right testicle

Measurements: 5.0 x 2.6 x 3.2 cm. No mass or microlithiasis
visualized.

Left testicle

Measurements: 5.3 x 2.5 x 3.4 cm. No mass or microlithiasis
visualized.

Right epididymis: There is a cyst in the head of the epididymis on
the right measuring 5 x 3 x 4 mm. No other mass seen on the right.
No inflammatory focus or hyperemia.

Left epididymis: Normal in size and appearance. No inflammatory
focus or hyperemia.

Hydrocele:  None visualized.

Varicocele:  None visualized.

Pulsed Doppler interrogation of both testes demonstrates normal low
resistance arterial and venous waveforms bilaterally.

No scrotal wall thickening or abscess on either side.
IMPRESSION: Subcentimeter cyst in head of the epididymis on the right. No
intratesticular mass. No testicular torsion on either side. No
inflammatory focus on either side.

## 2018-09-28 DIAGNOSIS — Z79899 Other long term (current) drug therapy: Secondary | ICD-10-CM | POA: Diagnosis not present

## 2018-09-28 DIAGNOSIS — K6289 Other specified diseases of anus and rectum: Secondary | ICD-10-CM | POA: Diagnosis not present

## 2018-09-28 DIAGNOSIS — R1030 Lower abdominal pain, unspecified: Secondary | ICD-10-CM | POA: Diagnosis not present

## 2018-09-28 DIAGNOSIS — R52 Pain, unspecified: Secondary | ICD-10-CM | POA: Diagnosis not present

## 2018-09-28 DIAGNOSIS — Z9049 Acquired absence of other specified parts of digestive tract: Secondary | ICD-10-CM | POA: Diagnosis not present

## 2018-09-28 DIAGNOSIS — F209 Schizophrenia, unspecified: Secondary | ICD-10-CM | POA: Diagnosis not present

## 2018-09-28 DIAGNOSIS — R112 Nausea with vomiting, unspecified: Secondary | ICD-10-CM | POA: Diagnosis not present

## 2018-09-29 DIAGNOSIS — F411 Generalized anxiety disorder: Secondary | ICD-10-CM | POA: Diagnosis not present

## 2018-09-29 DIAGNOSIS — E785 Hyperlipidemia, unspecified: Secondary | ICD-10-CM | POA: Diagnosis not present

## 2018-09-29 DIAGNOSIS — F23 Brief psychotic disorder: Secondary | ICD-10-CM | POA: Diagnosis not present

## 2018-09-29 DIAGNOSIS — K219 Gastro-esophageal reflux disease without esophagitis: Secondary | ICD-10-CM | POA: Diagnosis not present

## 2018-09-29 DIAGNOSIS — F17200 Nicotine dependence, unspecified, uncomplicated: Secondary | ICD-10-CM | POA: Diagnosis not present

## 2018-09-29 DIAGNOSIS — M5136 Other intervertebral disc degeneration, lumbar region: Secondary | ICD-10-CM | POA: Diagnosis not present

## 2018-09-29 DIAGNOSIS — I509 Heart failure, unspecified: Secondary | ICD-10-CM | POA: Diagnosis not present

## 2018-09-29 DIAGNOSIS — F251 Schizoaffective disorder, depressive type: Secondary | ICD-10-CM | POA: Diagnosis not present

## 2018-09-29 DIAGNOSIS — R45850 Homicidal ideations: Secondary | ICD-10-CM | POA: Diagnosis not present

## 2018-09-29 DIAGNOSIS — R440 Auditory hallucinations: Secondary | ICD-10-CM | POA: Diagnosis not present

## 2018-09-29 DIAGNOSIS — I10 Essential (primary) hypertension: Secondary | ICD-10-CM | POA: Diagnosis not present

## 2018-09-29 DIAGNOSIS — I110 Hypertensive heart disease with heart failure: Secondary | ICD-10-CM | POA: Diagnosis not present

## 2018-09-29 DIAGNOSIS — K6289 Other specified diseases of anus and rectum: Secondary | ICD-10-CM | POA: Diagnosis not present

## 2018-10-03 DIAGNOSIS — R079 Chest pain, unspecified: Secondary | ICD-10-CM | POA: Diagnosis not present

## 2018-10-03 DIAGNOSIS — F419 Anxiety disorder, unspecified: Secondary | ICD-10-CM | POA: Diagnosis not present

## 2018-10-03 DIAGNOSIS — G8929 Other chronic pain: Secondary | ICD-10-CM | POA: Diagnosis not present

## 2018-10-03 DIAGNOSIS — F329 Major depressive disorder, single episode, unspecified: Secondary | ICD-10-CM | POA: Diagnosis not present

## 2018-10-03 DIAGNOSIS — F17210 Nicotine dependence, cigarettes, uncomplicated: Secondary | ICD-10-CM | POA: Diagnosis not present

## 2018-10-03 DIAGNOSIS — R404 Transient alteration of awareness: Secondary | ICD-10-CM | POA: Diagnosis not present

## 2018-10-03 DIAGNOSIS — M545 Low back pain: Secondary | ICD-10-CM | POA: Diagnosis not present

## 2018-10-03 DIAGNOSIS — F331 Major depressive disorder, recurrent, moderate: Secondary | ICD-10-CM | POA: Diagnosis not present

## 2018-10-03 DIAGNOSIS — F259 Schizoaffective disorder, unspecified: Secondary | ICD-10-CM | POA: Diagnosis not present

## 2018-10-03 DIAGNOSIS — Z202 Contact with and (suspected) exposure to infections with a predominantly sexual mode of transmission: Secondary | ICD-10-CM | POA: Diagnosis not present

## 2018-10-03 DIAGNOSIS — R0789 Other chest pain: Secondary | ICD-10-CM | POA: Diagnosis not present

## 2018-10-03 DIAGNOSIS — M546 Pain in thoracic spine: Secondary | ICD-10-CM | POA: Diagnosis not present

## 2018-10-03 DIAGNOSIS — F5101 Primary insomnia: Secondary | ICD-10-CM | POA: Diagnosis not present

## 2018-10-03 DIAGNOSIS — Z888 Allergy status to other drugs, medicaments and biological substances status: Secondary | ICD-10-CM | POA: Diagnosis not present

## 2018-10-04 DIAGNOSIS — F259 Schizoaffective disorder, unspecified: Secondary | ICD-10-CM | POA: Diagnosis not present

## 2018-10-04 DIAGNOSIS — F17210 Nicotine dependence, cigarettes, uncomplicated: Secondary | ICD-10-CM | POA: Diagnosis not present

## 2018-10-04 DIAGNOSIS — Z202 Contact with and (suspected) exposure to infections with a predominantly sexual mode of transmission: Secondary | ICD-10-CM | POA: Diagnosis not present

## 2018-10-04 DIAGNOSIS — Z590 Homelessness: Secondary | ICD-10-CM | POA: Diagnosis not present

## 2018-10-04 DIAGNOSIS — Z7251 High risk heterosexual behavior: Secondary | ICD-10-CM | POA: Diagnosis not present

## 2018-10-04 DIAGNOSIS — M545 Low back pain: Secondary | ICD-10-CM | POA: Diagnosis not present

## 2018-10-04 DIAGNOSIS — K6289 Other specified diseases of anus and rectum: Secondary | ICD-10-CM | POA: Diagnosis not present

## 2018-10-04 DIAGNOSIS — F419 Anxiety disorder, unspecified: Secondary | ICD-10-CM | POA: Diagnosis not present

## 2018-10-04 DIAGNOSIS — G8929 Other chronic pain: Secondary | ICD-10-CM | POA: Diagnosis not present

## 2018-10-04 DIAGNOSIS — K602 Anal fissure, unspecified: Secondary | ICD-10-CM | POA: Diagnosis not present

## 2018-10-04 DIAGNOSIS — R102 Pelvic and perineal pain: Secondary | ICD-10-CM | POA: Diagnosis not present

## 2018-10-04 DIAGNOSIS — F331 Major depressive disorder, recurrent, moderate: Secondary | ICD-10-CM | POA: Diagnosis not present

## 2018-10-04 DIAGNOSIS — F329 Major depressive disorder, single episode, unspecified: Secondary | ICD-10-CM | POA: Diagnosis not present

## 2018-10-04 DIAGNOSIS — S3663XD Laceration of rectum, subsequent encounter: Secondary | ICD-10-CM | POA: Diagnosis not present

## 2018-10-04 DIAGNOSIS — F418 Other specified anxiety disorders: Secondary | ICD-10-CM | POA: Diagnosis not present

## 2018-10-04 DIAGNOSIS — R1030 Lower abdominal pain, unspecified: Secondary | ICD-10-CM | POA: Diagnosis not present

## 2018-10-04 DIAGNOSIS — Z9049 Acquired absence of other specified parts of digestive tract: Secondary | ICD-10-CM | POA: Diagnosis not present

## 2018-10-04 DIAGNOSIS — S3669XA Other injury of rectum, initial encounter: Secondary | ICD-10-CM | POA: Diagnosis not present

## 2018-10-04 DIAGNOSIS — K219 Gastro-esophageal reflux disease without esophagitis: Secondary | ICD-10-CM | POA: Diagnosis not present

## 2018-10-05 DIAGNOSIS — Z590 Homelessness: Secondary | ICD-10-CM | POA: Diagnosis not present

## 2018-10-05 DIAGNOSIS — K219 Gastro-esophageal reflux disease without esophagitis: Secondary | ICD-10-CM | POA: Diagnosis not present

## 2018-10-05 DIAGNOSIS — Z7251 High risk heterosexual behavior: Secondary | ICD-10-CM | POA: Diagnosis not present

## 2018-10-05 DIAGNOSIS — K602 Anal fissure, unspecified: Secondary | ICD-10-CM | POA: Diagnosis not present

## 2018-10-05 DIAGNOSIS — F259 Schizoaffective disorder, unspecified: Secondary | ICD-10-CM | POA: Diagnosis not present

## 2018-10-05 DIAGNOSIS — F418 Other specified anxiety disorders: Secondary | ICD-10-CM | POA: Diagnosis not present

## 2018-10-05 DIAGNOSIS — K6389 Other specified diseases of intestine: Secondary | ICD-10-CM | POA: Diagnosis not present

## 2018-10-05 DIAGNOSIS — S3669XA Other injury of rectum, initial encounter: Secondary | ICD-10-CM | POA: Diagnosis not present

## 2018-10-06 DIAGNOSIS — F259 Schizoaffective disorder, unspecified: Secondary | ICD-10-CM | POA: Diagnosis not present

## 2018-10-06 DIAGNOSIS — Z0000 Encounter for general adult medical examination without abnormal findings: Secondary | ICD-10-CM | POA: Diagnosis not present

## 2018-10-06 DIAGNOSIS — Z202 Contact with and (suspected) exposure to infections with a predominantly sexual mode of transmission: Secondary | ICD-10-CM | POA: Diagnosis not present

## 2018-10-06 DIAGNOSIS — Z09 Encounter for follow-up examination after completed treatment for conditions other than malignant neoplasm: Secondary | ICD-10-CM | POA: Diagnosis not present

## 2018-10-06 DIAGNOSIS — Z23 Encounter for immunization: Secondary | ICD-10-CM | POA: Diagnosis not present

## 2018-10-06 DIAGNOSIS — K602 Anal fissure, unspecified: Secondary | ICD-10-CM | POA: Diagnosis not present

## 2018-10-11 DIAGNOSIS — G8929 Other chronic pain: Secondary | ICD-10-CM | POA: Diagnosis not present

## 2018-10-11 DIAGNOSIS — G8918 Other acute postprocedural pain: Secondary | ICD-10-CM | POA: Diagnosis not present

## 2018-10-11 DIAGNOSIS — Z888 Allergy status to other drugs, medicaments and biological substances status: Secondary | ICD-10-CM | POA: Diagnosis not present

## 2018-10-11 DIAGNOSIS — F329 Major depressive disorder, single episode, unspecified: Secondary | ICD-10-CM | POA: Diagnosis not present

## 2018-10-11 DIAGNOSIS — K602 Anal fissure, unspecified: Secondary | ICD-10-CM | POA: Diagnosis not present

## 2018-10-11 DIAGNOSIS — F259 Schizoaffective disorder, unspecified: Secondary | ICD-10-CM | POA: Diagnosis not present

## 2018-10-11 DIAGNOSIS — R52 Pain, unspecified: Secondary | ICD-10-CM | POA: Diagnosis not present

## 2018-10-11 DIAGNOSIS — R109 Unspecified abdominal pain: Secondary | ICD-10-CM | POA: Diagnosis not present

## 2018-10-11 DIAGNOSIS — Z79899 Other long term (current) drug therapy: Secondary | ICD-10-CM | POA: Diagnosis not present

## 2018-10-11 DIAGNOSIS — F17210 Nicotine dependence, cigarettes, uncomplicated: Secondary | ICD-10-CM | POA: Diagnosis not present

## 2018-10-11 DIAGNOSIS — K6289 Other specified diseases of anus and rectum: Secondary | ICD-10-CM | POA: Diagnosis not present

## 2018-10-13 DIAGNOSIS — F5101 Primary insomnia: Secondary | ICD-10-CM | POA: Diagnosis not present

## 2018-10-13 DIAGNOSIS — F419 Anxiety disorder, unspecified: Secondary | ICD-10-CM | POA: Diagnosis not present

## 2018-10-13 DIAGNOSIS — F259 Schizoaffective disorder, unspecified: Secondary | ICD-10-CM | POA: Diagnosis not present

## 2018-10-13 DIAGNOSIS — J029 Acute pharyngitis, unspecified: Secondary | ICD-10-CM | POA: Diagnosis not present

## 2018-10-14 DIAGNOSIS — F17210 Nicotine dependence, cigarettes, uncomplicated: Secondary | ICD-10-CM | POA: Diagnosis not present

## 2018-10-14 DIAGNOSIS — Z886 Allergy status to analgesic agent status: Secondary | ICD-10-CM | POA: Diagnosis not present

## 2018-10-14 DIAGNOSIS — Z9889 Other specified postprocedural states: Secondary | ICD-10-CM | POA: Diagnosis not present

## 2018-10-14 DIAGNOSIS — K602 Anal fissure, unspecified: Secondary | ICD-10-CM | POA: Diagnosis not present

## 2018-10-14 DIAGNOSIS — F329 Major depressive disorder, single episode, unspecified: Secondary | ICD-10-CM | POA: Diagnosis not present

## 2018-10-14 DIAGNOSIS — K645 Perianal venous thrombosis: Secondary | ICD-10-CM | POA: Diagnosis not present

## 2018-10-14 DIAGNOSIS — Z79899 Other long term (current) drug therapy: Secondary | ICD-10-CM | POA: Diagnosis not present

## 2018-10-15 DIAGNOSIS — F17210 Nicotine dependence, cigarettes, uncomplicated: Secondary | ICD-10-CM | POA: Diagnosis not present

## 2018-10-15 DIAGNOSIS — F329 Major depressive disorder, single episode, unspecified: Secondary | ICD-10-CM | POA: Diagnosis not present

## 2018-10-15 DIAGNOSIS — Z79899 Other long term (current) drug therapy: Secondary | ICD-10-CM | POA: Diagnosis not present

## 2018-10-15 DIAGNOSIS — Z886 Allergy status to analgesic agent status: Secondary | ICD-10-CM | POA: Diagnosis not present

## 2018-10-15 DIAGNOSIS — K645 Perianal venous thrombosis: Secondary | ICD-10-CM | POA: Diagnosis not present

## 2018-10-17 DIAGNOSIS — R457 State of emotional shock and stress, unspecified: Secondary | ICD-10-CM | POA: Diagnosis not present

## 2018-10-17 DIAGNOSIS — F29 Unspecified psychosis not due to a substance or known physiological condition: Secondary | ICD-10-CM | POA: Diagnosis not present

## 2018-10-18 DIAGNOSIS — R457 State of emotional shock and stress, unspecified: Secondary | ICD-10-CM | POA: Diagnosis not present

## 2018-10-18 DIAGNOSIS — G8918 Other acute postprocedural pain: Secondary | ICD-10-CM | POA: Diagnosis not present

## 2018-10-18 DIAGNOSIS — Z743 Need for continuous supervision: Secondary | ICD-10-CM | POA: Diagnosis not present

## 2018-10-18 DIAGNOSIS — F17210 Nicotine dependence, cigarettes, uncomplicated: Secondary | ICD-10-CM | POA: Diagnosis not present

## 2018-10-18 DIAGNOSIS — Z21 Asymptomatic human immunodeficiency virus [HIV] infection status: Secondary | ICD-10-CM | POA: Diagnosis not present

## 2018-10-18 DIAGNOSIS — Z760 Encounter for issue of repeat prescription: Secondary | ICD-10-CM | POA: Diagnosis not present

## 2018-10-18 DIAGNOSIS — K6289 Other specified diseases of anus and rectum: Secondary | ICD-10-CM | POA: Diagnosis not present

## 2018-10-18 DIAGNOSIS — R52 Pain, unspecified: Secondary | ICD-10-CM | POA: Diagnosis not present

## 2018-10-21 DIAGNOSIS — F419 Anxiety disorder, unspecified: Secondary | ICD-10-CM | POA: Diagnosis not present

## 2018-10-21 DIAGNOSIS — F331 Major depressive disorder, recurrent, moderate: Secondary | ICD-10-CM | POA: Diagnosis not present

## 2018-10-21 DIAGNOSIS — F259 Schizoaffective disorder, unspecified: Secondary | ICD-10-CM | POA: Diagnosis not present

## 2018-10-26 DIAGNOSIS — F259 Schizoaffective disorder, unspecified: Secondary | ICD-10-CM | POA: Diagnosis not present

## 2018-10-26 DIAGNOSIS — R58 Hemorrhage, not elsewhere classified: Secondary | ICD-10-CM | POA: Diagnosis not present

## 2018-10-26 DIAGNOSIS — K6289 Other specified diseases of anus and rectum: Secondary | ICD-10-CM | POA: Diagnosis not present

## 2018-10-26 DIAGNOSIS — Z888 Allergy status to other drugs, medicaments and biological substances status: Secondary | ICD-10-CM | POA: Diagnosis not present

## 2018-10-26 DIAGNOSIS — K30 Functional dyspepsia: Secondary | ICD-10-CM | POA: Diagnosis not present

## 2018-10-26 DIAGNOSIS — R52 Pain, unspecified: Secondary | ICD-10-CM | POA: Diagnosis not present

## 2018-10-26 DIAGNOSIS — Z9049 Acquired absence of other specified parts of digestive tract: Secondary | ICD-10-CM | POA: Diagnosis not present

## 2018-10-26 DIAGNOSIS — F17210 Nicotine dependence, cigarettes, uncomplicated: Secondary | ICD-10-CM | POA: Diagnosis not present

## 2018-10-26 DIAGNOSIS — K760 Fatty (change of) liver, not elsewhere classified: Secondary | ICD-10-CM | POA: Diagnosis not present

## 2018-10-26 DIAGNOSIS — Z79899 Other long term (current) drug therapy: Secondary | ICD-10-CM | POA: Diagnosis not present

## 2018-10-30 DIAGNOSIS — F17210 Nicotine dependence, cigarettes, uncomplicated: Secondary | ICD-10-CM | POA: Diagnosis not present

## 2018-10-30 DIAGNOSIS — R52 Pain, unspecified: Secondary | ICD-10-CM | POA: Diagnosis not present

## 2018-10-30 DIAGNOSIS — K6289 Other specified diseases of anus and rectum: Secondary | ICD-10-CM | POA: Diagnosis not present

## 2018-10-30 DIAGNOSIS — Z888 Allergy status to other drugs, medicaments and biological substances status: Secondary | ICD-10-CM | POA: Diagnosis not present

## 2018-10-31 DIAGNOSIS — G8929 Other chronic pain: Secondary | ICD-10-CM | POA: Diagnosis not present

## 2018-10-31 DIAGNOSIS — M545 Low back pain: Secondary | ICD-10-CM | POA: Diagnosis not present

## 2018-10-31 DIAGNOSIS — F5101 Primary insomnia: Secondary | ICD-10-CM | POA: Diagnosis not present

## 2018-10-31 DIAGNOSIS — F419 Anxiety disorder, unspecified: Secondary | ICD-10-CM | POA: Diagnosis not present

## 2018-11-03 DIAGNOSIS — F329 Major depressive disorder, single episode, unspecified: Secondary | ICD-10-CM | POA: Diagnosis not present

## 2018-11-03 DIAGNOSIS — B349 Viral infection, unspecified: Secondary | ICD-10-CM | POA: Diagnosis not present

## 2018-11-03 DIAGNOSIS — B1920 Unspecified viral hepatitis C without hepatic coma: Secondary | ICD-10-CM | POA: Diagnosis not present

## 2018-11-03 DIAGNOSIS — F259 Schizoaffective disorder, unspecified: Secondary | ICD-10-CM | POA: Diagnosis not present

## 2018-11-03 DIAGNOSIS — F17210 Nicotine dependence, cigarettes, uncomplicated: Secondary | ICD-10-CM | POA: Diagnosis not present

## 2018-11-03 DIAGNOSIS — Z888 Allergy status to other drugs, medicaments and biological substances status: Secondary | ICD-10-CM | POA: Diagnosis not present

## 2018-11-03 DIAGNOSIS — Z79899 Other long term (current) drug therapy: Secondary | ICD-10-CM | POA: Diagnosis not present

## 2018-11-05 DIAGNOSIS — J111 Influenza due to unidentified influenza virus with other respiratory manifestations: Secondary | ICD-10-CM | POA: Diagnosis not present

## 2018-11-05 DIAGNOSIS — R6889 Other general symptoms and signs: Secondary | ICD-10-CM | POA: Diagnosis not present

## 2018-11-05 DIAGNOSIS — R110 Nausea: Secondary | ICD-10-CM | POA: Diagnosis not present

## 2018-11-06 DIAGNOSIS — J101 Influenza due to other identified influenza virus with other respiratory manifestations: Secondary | ICD-10-CM | POA: Diagnosis not present

## 2018-11-06 DIAGNOSIS — F259 Schizoaffective disorder, unspecified: Secondary | ICD-10-CM | POA: Diagnosis not present

## 2018-11-06 DIAGNOSIS — F329 Major depressive disorder, single episode, unspecified: Secondary | ICD-10-CM | POA: Diagnosis not present

## 2018-11-06 DIAGNOSIS — F17210 Nicotine dependence, cigarettes, uncomplicated: Secondary | ICD-10-CM | POA: Diagnosis not present

## 2018-11-06 DIAGNOSIS — E860 Dehydration: Secondary | ICD-10-CM | POA: Diagnosis not present

## 2018-11-06 DIAGNOSIS — Z590 Homelessness: Secondary | ICD-10-CM | POA: Diagnosis not present

## 2018-11-06 DIAGNOSIS — F419 Anxiety disorder, unspecified: Secondary | ICD-10-CM | POA: Diagnosis not present

## 2019-01-19 ENCOUNTER — Other Ambulatory Visit: Payer: Self-pay

## 2019-01-19 ENCOUNTER — Emergency Department (HOSPITAL_COMMUNITY)
Admission: EM | Admit: 2019-01-19 | Discharge: 2019-01-21 | Disposition: A | Discharge status: Home / Self Care | Payer: Medicare (Managed Care) | Attending: Emergency Medicine | Admitting: Emergency Medicine

## 2019-01-19 DIAGNOSIS — Z008 Encounter for other general examination: Secondary | ICD-10-CM | POA: Diagnosis not present

## 2019-01-19 DIAGNOSIS — R440 Auditory hallucinations: Secondary | ICD-10-CM | POA: Diagnosis present

## 2019-01-19 DIAGNOSIS — Z79899 Other long term (current) drug therapy: Secondary | ICD-10-CM | POA: Insufficient documentation

## 2019-01-19 DIAGNOSIS — F250 Schizoaffective disorder, bipolar type: Secondary | ICD-10-CM | POA: Insufficient documentation

## 2019-01-19 LAB — CBC WITH DIFFERENTIAL/PLATELET
ABS IMMATURE GRANULOCYTES: 0.01 10*3/uL (ref 0.00–0.07)
Basophils Absolute: 0 10*3/uL (ref 0.0–0.1)
Basophils Relative: 0 %
Eosinophils Absolute: 0.3 10*3/uL (ref 0.0–0.5)
Eosinophils Relative: 6 %
HCT: 41.7 % (ref 39.0–52.0)
Hemoglobin: 13 g/dL (ref 13.0–17.0)
Immature Granulocytes: 0 %
Lymphocytes Relative: 25 %
Lymphs Abs: 1.2 10*3/uL (ref 0.7–4.0)
MCH: 28.9 pg (ref 26.0–34.0)
MCHC: 31.2 g/dL (ref 30.0–36.0)
MCV: 92.7 fL (ref 80.0–100.0)
MONOS PCT: 13 %
Monocytes Absolute: 0.7 10*3/uL (ref 0.1–1.0)
Neutro Abs: 2.7 10*3/uL (ref 1.7–7.7)
Neutrophils Relative %: 56 %
Platelets: 186 10*3/uL (ref 150–400)
RBC: 4.5 MIL/uL (ref 4.22–5.81)
RDW: 14.6 % (ref 11.5–15.5)
WBC: 4.9 10*3/uL (ref 4.0–10.5)
nRBC: 0 % (ref 0.0–0.2)

## 2019-01-19 LAB — COMPREHENSIVE METABOLIC PANEL
ALT: 58 U/L — AB (ref 0–44)
AST: 20 U/L (ref 15–41)
Albumin: 3.7 g/dL (ref 3.5–5.0)
Alkaline Phosphatase: 116 U/L (ref 38–126)
Anion gap: 7 (ref 5–15)
BUN: 9 mg/dL (ref 6–20)
CO2: 26 mmol/L (ref 22–32)
CREATININE: 0.73 mg/dL (ref 0.61–1.24)
Calcium: 8.7 mg/dL — ABNORMAL LOW (ref 8.9–10.3)
Chloride: 107 mmol/L (ref 98–111)
GFR calc Af Amer: >60 mL/min (ref >60)
GFR calc non Af Amer: >60 mL/min (ref >60)
Glucose, Bld: 101 mg/dL — ABNORMAL HIGH (ref 70–99)
Potassium: 4 mmol/L (ref 3.5–5.1)
Sodium: 140 mmol/L (ref 135–145)
Total Bilirubin: 0.5 mg/dL (ref 0.3–1.2)
Total Protein: 6.5 g/dL (ref 6.5–8.1)

## 2019-01-19 LAB — RAPID URINE DRUG SCREEN, HOSP PERFORMED
Amphetamines: NOT DETECTED
BENZODIAZEPINES: NOT DETECTED
Barbiturates: POSITIVE — AB
COCAINE: NOT DETECTED
Opiates: NOT DETECTED
Tetrahydrocannabinol: NOT DETECTED

## 2019-01-19 LAB — ETHANOL: Alcohol, Ethyl (B): <10 mg/dL (ref <10)

## 2019-01-19 MED ORDER — LORAZEPAM 1 MG PO TABS
2.0000 mg | ORAL_TABLET | Freq: Four times a day (QID) | ORAL | Status: DC | PRN
  Administered 2019-01-19: 2 mg via ORAL
  Filled 2019-01-19: qty 2

## 2019-01-19 NOTE — ED Notes (Signed)
EDP at bedside  

## 2019-01-19 NOTE — ED Notes (Signed)
Specimen cup provided. Pt encouraged to provide urine.

## 2019-01-19 NOTE — ED Triage Notes (Signed)
Pt to room #39, pleasant on approach, Reports recently relocating to [place]. Reports he has been in [place] for 2 days, and has been off his medication since he has been in [place]. Pt reports family is from [place], but he is currently homeless. Pt reports hx of schizoaffective disorder, depression, and anxiety. Reports being prescribed Celexa and Thorazine. Pt endorsing SI without plan- verbally contracts for safety. Endorsing AH. Paranoia noted, asking this nurse if there is poison in his lunch tray from cafeteria. Encouragement and support provided. Special checks q 15 mins in place for safety, Video monitoring in place. Will continue to monitor.

## 2019-01-19 NOTE — ED Notes (Signed)
TTS at bedside. 

## 2019-01-19 NOTE — ED Provider Notes (Signed)
[facility] DEPT Provider Note   CSN: [id number] Arrival date & time: 01/19/19  1148    History   Chief Complaint Chief Complaint  Patient presents with  . Psychiatric Evaluation    HPI Oshae Simmering is a 43 y.o. male.     43 year old male with history of schizophrenia who presents with increasing anxiety as well as altered hallucinations which are telling him to harm himself.  Does have a history of suicide attempt in the past.  States that he has not taken his medications in the last 48 hours.  Denies any current suicide attempt.  No recent use of alcohol or illicit drugs.  Moved here recently from [place].     No past medical history on file.  There are no active problems to display for this patient.         Home Medications    Prior to Admission medications   Not on File    Family History No family history on file.  Social History Social History   Tobacco Use  . Smoking status: Not on file  Substance Use Topics  . Alcohol use: Not on file  . Drug use: Not on file     Allergies   Patient has no allergy information on record.   Review of Systems Review of Systems  All other systems reviewed and are negative.    Physical Exam Updated Vital Signs BP (!) 111/48 (BP Location: Left Arm)   Pulse 99   Temp 98.3 F (36.8 C) (Oral)   Resp 20   Ht 1.905 m (6\' 3" )   Wt 129.3 kg   SpO2 98%   BMI 35.62 kg/m   Physical Exam Vitals signs and nursing note reviewed.  Constitutional:      General: He is not in acute distress.    Appearance: Normal appearance. He is well-developed. He is not toxic-appearing.  HENT:     Head: Normocephalic and atraumatic.  Eyes:     General: Lids are normal.     Conjunctiva/sclera: Conjunctivae normal.     Pupils: Pupils are equal, round, and reactive to light.  Neck:     Musculoskeletal: Normal range of motion and neck supple.     Thyroid: No thyroid mass.     Trachea: No  tracheal deviation.  Cardiovascular:     Rate and Rhythm: Normal rate and regular rhythm.     Heart sounds: Normal heart sounds. No murmur. No gallop.   Pulmonary:     Effort: Pulmonary effort is normal. No respiratory distress.     Breath sounds: Normal breath sounds. No stridor. No decreased breath sounds, wheezing, rhonchi or rales.  Abdominal:     General: Bowel sounds are normal. There is no distension.     Palpations: Abdomen is soft.     Tenderness: There is no abdominal tenderness. There is no rebound.  Musculoskeletal: Normal range of motion.        General: No tenderness.  Skin:    General: Skin is warm and dry.     Findings: No abrasion or rash.  Neurological:     Mental Status: He is alert and oriented to person, place, and time.     GCS: GCS eye subscore is 4. GCS verbal subscore is 5. GCS motor subscore is 6.     Cranial Nerves: No cranial nerve deficit.     Sensory: No sensory deficit.  Psychiatric:        Attention and Perception: He  perceives auditory hallucinations.        Mood and Affect: Mood is anxious.        Speech: Speech is rapid and pressured.        Behavior: Behavior is agitated and aggressive.        Thought Content: Thought content includes suicidal ideation. Thought content does not include homicidal ideation. Thought content does not include homicidal plan.      ED Treatments / Results  Labs (all labs ordered are listed, but only abnormal results are displayed) Labs Reviewed  ETHANOL  RAPID URINE DRUG SCREEN, HOSP PERFORMED  CBC WITH DIFFERENTIAL/PLATELET  COMPREHENSIVE METABOLIC PANEL    EKG None  Radiology No results found.  Procedures Procedures (including critical care time)  Medications Ordered in ED Medications  LORazepam (ATIVAN) tablet 2 mg (has no administration in time range)     Initial Impression / Assessment and Plan / ED Course  I have reviewed the triage vital signs and the nursing notes.  Pertinent labs &  imaging results that were available during my care of the patient were reviewed by me and considered in my medical decision making (see chart for details).        Patient to be medically clear for psychiatric disposition  Final Clinical Impressions(s) / ED Diagnoses   Final diagnoses:  None    ED Discharge Orders    None       Lacretia Leigh, MD 01/19/19 1227
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
## 2019-01-19 NOTE — ED Notes (Signed)
Received medication on first shift and has been sleeping since. This Probation officer has not spoken with him this shift to assess him due to him being asleep

## 2019-01-20 DIAGNOSIS — F419 Anxiety disorder, unspecified: Secondary | ICD-10-CM

## 2019-01-20 DIAGNOSIS — R45851 Suicidal ideations: Secondary | ICD-10-CM

## 2019-01-20 DIAGNOSIS — R440 Auditory hallucinations: Secondary | ICD-10-CM | POA: Diagnosis present

## 2019-01-20 DIAGNOSIS — Z590 Homelessness: Secondary | ICD-10-CM

## 2019-01-20 MED ORDER — TRAZODONE HCL 100 MG PO TABS
100.0000 mg | ORAL_TABLET | Freq: Every evening | ORAL | Status: DC | PRN
  Administered 2019-01-20: 100 mg via ORAL
  Filled 2019-01-20: qty 1

## 2019-01-20 MED ORDER — HYDROXYZINE HCL 25 MG PO TABS
50.0000 mg | ORAL_TABLET | Freq: Once | ORAL | Status: AC | PRN
  Administered 2019-01-20: 50 mg via ORAL
  Filled 2019-01-20: qty 2

## 2019-01-20 MED ORDER — GABAPENTIN 300 MG PO CAPS
300.0000 mg | ORAL_CAPSULE | Freq: Three times a day (TID) | ORAL | Status: DC
  Administered 2019-01-20 – 2019-01-21 (×3): 300 mg via ORAL
  Filled 2019-01-20 (×3): qty 1

## 2019-01-20 MED ORDER — PANTOPRAZOLE SODIUM 20 MG PO TBEC
20.0000 mg | DELAYED_RELEASE_TABLET | Freq: Every day | ORAL | Status: DC
  Administered 2019-01-20 – 2019-01-21 (×2): 20 mg via ORAL
  Filled 2019-01-20 (×2): qty 1

## 2019-01-20 MED ORDER — ZIPRASIDONE HCL 20 MG PO CAPS
20.0000 mg | ORAL_CAPSULE | Freq: Two times a day (BID) | ORAL | Status: DC
  Administered 2019-01-20 – 2019-01-21 (×2): 20 mg via ORAL
  Filled 2019-01-20 (×2): qty 1

## 2019-01-20 MED ORDER — HYDROXYZINE HCL 25 MG PO TABS: 50.0000 mg | ORAL_TABLET | Freq: Three times a day (TID) | ORAL | Status: DC | PRN

## 2019-01-20 MED ORDER — HYDROXYZINE HCL 25 MG PO TABS
25.0000 mg | ORAL_TABLET | Freq: Three times a day (TID) | ORAL | Status: DC | PRN
  Administered 2019-01-20: 25 mg via ORAL
  Filled 2019-01-20: qty 1

## 2019-01-20 NOTE — BH Assessment (Signed)
[facility] Assessment Progress Note   Per Buford Dresser, DO, this voluntary pt is to be held at [facility] overnight for further observation and stabilization.  Pt's nurse has been notified.  Jalene Mullet, [place] Coordinator [phone]

## 2019-01-20 NOTE — ED Notes (Signed)
Pt sleeping at present, no distress noted, calm & cooperative,  Monitoring for safety, Q 15 min checks in effect.

## 2019-01-20 NOTE — ED Notes (Signed)
Requested Protonix for GERD sx. Called Dr for order and gave him med as ordered. His room smells strongly of flatulence. He reports his voices are"no good" and he wanted his sleeping pill at same time he got his HS routine medicine and it was given.

## 2019-01-20 NOTE — ED Notes (Addendum)
Patient states that his voices are worsening and that they are telling him to hurt himself. Patient with increase anxiety. Spoke with Jinny Blossom NP made aware of patients symptoms.  New order for vistaril 50 mg will administer and monitor for results. Patient has received Geodon 20mg  today.

## 2019-01-20 NOTE — ED Notes (Signed)
Up to use the bathroom and immediately returned to bed after urinating.
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
## 2019-01-21 NOTE — ED Notes (Signed)
Pt d/c home per MD order. Discharge summary reviewed with pt. Pt verbalizes understanding. Denies SI/HI. Bus pass provided per pt request. Personal property returned. Pt signed e-signature. Ambulatory off unit with MHT.

## 2019-01-21 NOTE — Discharge Instructions (Signed)
For your mental health needs, you are advised to follow up with [facility].  New and returning patients are seen at their walk-in clinic.  Walk-in hours are Monday - Friday from 8:00 am - 3:00 pm.  Walk-in patients are seen on a first come, first served basis.  Try to arrive as early as possible for the best chance of being seen the same day:       [facility]      [address]      [place], [place] [address]      [phone]

## 2019-01-21 NOTE — BH Assessment (Signed)
[facility] Assessment Progress Note  Per Buford Dresser, DO, this pt does not require psychiatric hospitalization at this time.  Pt is to be discharged from [facility] with recommendation to follow up with [facility].  This has been included in pt's discharge instructions.  Pt's nurse, Caryl Pina, has been notified.  Jalene Mullet, [place] Triage Specialist [phone]

## 2019-01-21 NOTE — Consult Note (Addendum)
[facility] Psych ED Discharge  01/21/2019 12:04 PM [name]  MRN:  [id number] Principal Problem: Auditory hallucinations Discharge Diagnoses: Principal Problem:   Auditory hallucinations   Subjective: Pt was seen and chart reviewed with treatment team and Dr Mariea Clonts. Pt denies suicidal/homicidal ideation. He endorses chronic AH. He does not appear to be responding to internal stimuli. Pt is homeless and frequently comes to the emergency room complaining of anxiety and auditory hallucinations. Pt was restarted on his home medications and remained in the ED overnight. He is pleasant, calm and cooperative and wants resources for therapy and medication management. Pt has family in this area but remains homeless. He lives at [facility]. Pt is able to contract for safety upon discharge. Pt is psychiatrically clear.   Total Time spent with patient: 30 minutes  Past Psychiatric History: As above  Family History: No family history on file. Family Psychiatric  History: None per chart review.  Social History:  Social History   Substance and Sexual Activity  Alcohol Use Not on file     Social History   Substance and Sexual Activity  Drug Use Not on file    Social History   Socioeconomic History  . Marital status: Single    Spouse name: Not on file  . Number of children: Not on file  . Years of education: Not on file  . Highest education level: Not on file  Occupational History  . Not on file  Social Needs  . Financial resource strain: Not on file  . Food insecurity:    Worry: Not on file    Inability: Not on file  . Transportation needs:    Medical: Not on file    Non-medical: Not on file  Tobacco Use  . Smoking status: Not on file  Substance and Sexual Activity  . Alcohol use: Not on file  . Drug use: Not on file  . Sexual activity: Not on file  Lifestyle  . Physical activity:    Days per week: Not on file    Minutes per session: Not on file  . Stress: Not on file   Relationships  . Social connections:    Talks on phone: Not on file    Gets together: Not on file    Attends religious service: Not on file    Active member of club or organization: Not on file    Attends meetings of clubs or organizations: Not on file    Relationship status: Not on file  Other Topics Concern  . Not on file  Social History Narrative  . Not on file    Has this patient used any form of tobacco in the last 30 days? (Cigarettes, Smokeless Tobacco, Cigars, and/or Pipes) Prescription not provided because: Pt does not use tobacco  Current Medications: Current Facility-Administered Medications  Medication Dose Route Frequency Provider Last Rate Last Dose  . gabapentin (NEURONTIN) capsule 300 mg  300 mg Oral TID Ethelene Hal, NP   300 mg at 01/21/19 1049  . hydrOXYzine (ATARAX/VISTARIL) tablet 50 mg  50 mg Oral TID PRN Ethelene Hal, NP      . pantoprazole (PROTONIX) EC tablet 20 mg  20 mg Oral Daily Isla Pence, MD   20 mg at 01/21/19 1049  . traZODone (DESYREL) tablet 100 mg  100 mg Oral QHS PRN Faythe Dingwall, DO   100 mg at 01/20/19 2111  . ziprasidone (GEODON) capsule 20 mg  20 mg Oral BID WC Faythe Dingwall,  DO   20 mg at 01/21/19 1049   Current Outpatient Medications  Medication Sig Dispense Refill  . acetaminophen (TYLENOL) 650 MG suppository Place 650 mg rectally.    . ALPRAZolam (XANAX) 1 MG tablet Take 1 mg by mouth daily.    . busPIRone (BUSPAR) 10 MG tablet Take 10 mg by mouth 2 (two) times daily as needed.    . Cholecalciferol (VITAMIN D) 50 MCG (2000 UT) tablet Take 2,000 Units by mouth daily.    . DESCOVY 200-25 MG tablet Take 1 tablet by mouth daily.    . divalproex (DEPAKOTE) 500 MG DR tablet Take 500 mg by mouth 2 (two) times daily.    Mariane Baumgarten Sodium 100 MG capsule Take 100 mg by mouth daily.    Marland Kitchen gabapentin (NEURONTIN) 300 MG capsule Take 600 mg by mouth 3 (three) times daily.    Marland Kitchen HYDROcodone-acetaminophen  (NORCO/VICODIN) 5-325 MG tablet Take 1 tablet by mouth every 4 (four) hours as needed for pain.    Marland Kitchen ibuprofen (ADVIL,MOTRIN) 600 MG tablet Take 600 mg by mouth daily.    . methocarbamol (ROBAXIN) 500 MG tablet Take 500 mg by mouth 2 (two) times daily as needed.    . mirtazapine (REMERON) 15 MG tablet Take 15 mg by mouth at bedtime.    . mirtazapine (REMERON) 30 MG tablet Take 30 mg by mouth at bedtime.    . NONFORMULARY OR COMPOUNDED ITEM Apply topically. Nifedipine .2% in Lidocaine 4% ointment    . ondansetron (ZOFRAN) 4 MG tablet Take 1-2 tablets by mouth daily as needed for nausea/vomiting.    Marland Kitchen oxyCODONE-acetaminophen (PERCOCET/ROXICET) 5-325 MG tablet Take 1 tablet by mouth every 4 (four) hours as needed for pain.    . pantoprazole (PROTONIX) 40 MG tablet Take 40 mg by mouth daily.    Marland Kitchen Phenylephrine-DM-GG-APAP 5-10-200-325 MG TABS Take 1 tablet by mouth.    . prochlorperazine (COMPAZINE) 25 MG suppository Place 25 mg rectally.    . traZODone (DESYREL) 150 MG tablet Take 150 mg by mouth daily.    . ziprasidone (GEODON) 20 MG capsule Take 20 mg by mouth 2 (two) times daily.     Musculoskeletal: Strength & Muscle Tone: within normal limits Gait & Station: normal Patient leans: N/A  Psychiatric Specialty Exam: Physical Exam  Nursing note and vitals reviewed. Constitutional: He is oriented to person, place, and time. He appears well-developed and well-nourished.  HENT:  Head: Normocephalic and atraumatic.  Neck: Normal range of motion.  Respiratory: Effort normal.  Musculoskeletal: Normal range of motion.  Neurological: He is alert and oriented to person, place, and time.  Psychiatric: His speech is normal and behavior is normal. Judgment and thought content normal. His mood appears anxious. Cognition and memory are normal.    Review of Systems  Psychiatric/Behavioral: Positive for hallucinations (auditory). The patient is nervous/anxious.   All other systems reviewed and are  negative.   Blood pressure 117/75, pulse 74, temperature 98.5 F (36.9 C), temperature source Oral, resp. rate 18, height 6\' 3"  (1.905 m), weight 129.3 kg, SpO2 99 %.Body mass index is 35.62 kg/m.  General Appearance: Casual  Eye Contact:  Good  Speech:  Clear and Coherent and Normal Rate  Volume:  Normal  Mood:  Anxious  Affect:  Congruent  Thought Process:  Coherent, Goal Directed and Descriptions of Associations: Intact  Orientation:  Full (Time, Place, and Person)  Thought Content:  Hallucinations: Auditory  Suicidal Thoughts:  No  Homicidal Thoughts:  No  Memory:  Immediate;   Good Recent;   Good Remote;   Fair  Judgement:  Fair  Insight:  Fair  Psychomotor Activity:  Normal  Concentration:  Concentration: Good and Attention Span: Good  Recall:  Good  Fund of Knowledge:  Good  Language:  Good  Akathisia:  No  Handed:  Right  AIMS (if indicated):   N/A  Assets:  Agricultural consultant Social Support  ADL's:  Intact  Cognition:  WNL  Sleep:   N/A    Demographic Factors:  Male, Low socioeconomic status and Unemployed  Loss Factors: Financial problems/change in socioeconomic status  Historical Factors: Family history of mental illness or substance abuse  Risk Reduction Factors:   Sense of responsibility to family  Continued Clinical Symptoms:  Severe Anxiety and/or Agitation  Cognitive Features That Contribute To Risk:  Closed-mindedness    Suicide Risk:  Minimal: No identifiable suicidal ideation.  Patients presenting with no risk factors but with morbid ruminations; may be classified as minimal risk based on the severity of the depressive symptoms   Plan Of Care/Follow-up recommendations:  Activity:  as tolerated Diet:  Heart healthy  Disposition and Treatment Team: Auditory hallucinations Take all medications as prescribed by your outpatient provider. Keep all follow-up appointments as scheduled.  Do not consume alcohol  or use illegal drugs while on prescription medications. Report any adverse effects from your medications to your primary care provider promptly.  In the event of recurrent symptoms or worsening symptoms, call 911, a crisis hotline, or go to the nearest emergency department for evaluation.   Ethelene Hal, NP 01/21/2019, 12:04 PM   Patient seen face-to-face for psychiatric evaluation, chart reviewed and case discussed with the physician extender and developed treatment plan. Reviewed the information documented and agree with the treatment plan.  Buford Dresser, DO 01/21/19 12:50 PM

## 2019-02-14 IMAGING — XA Imaging study
2 series · 2 of 2 positions shown · non-contrast
Comparison: none

CLINICAL DATA: Lumbosacral spondylosis without myelopathy. Low back
and posterior right leg pain. Some numbness in the thighs. 90%
sustained improvement after the first epidural injection last month.

[Series 1: ortho standard · 1 of 1 slices shown (1 of 2)]
[im 1/1]
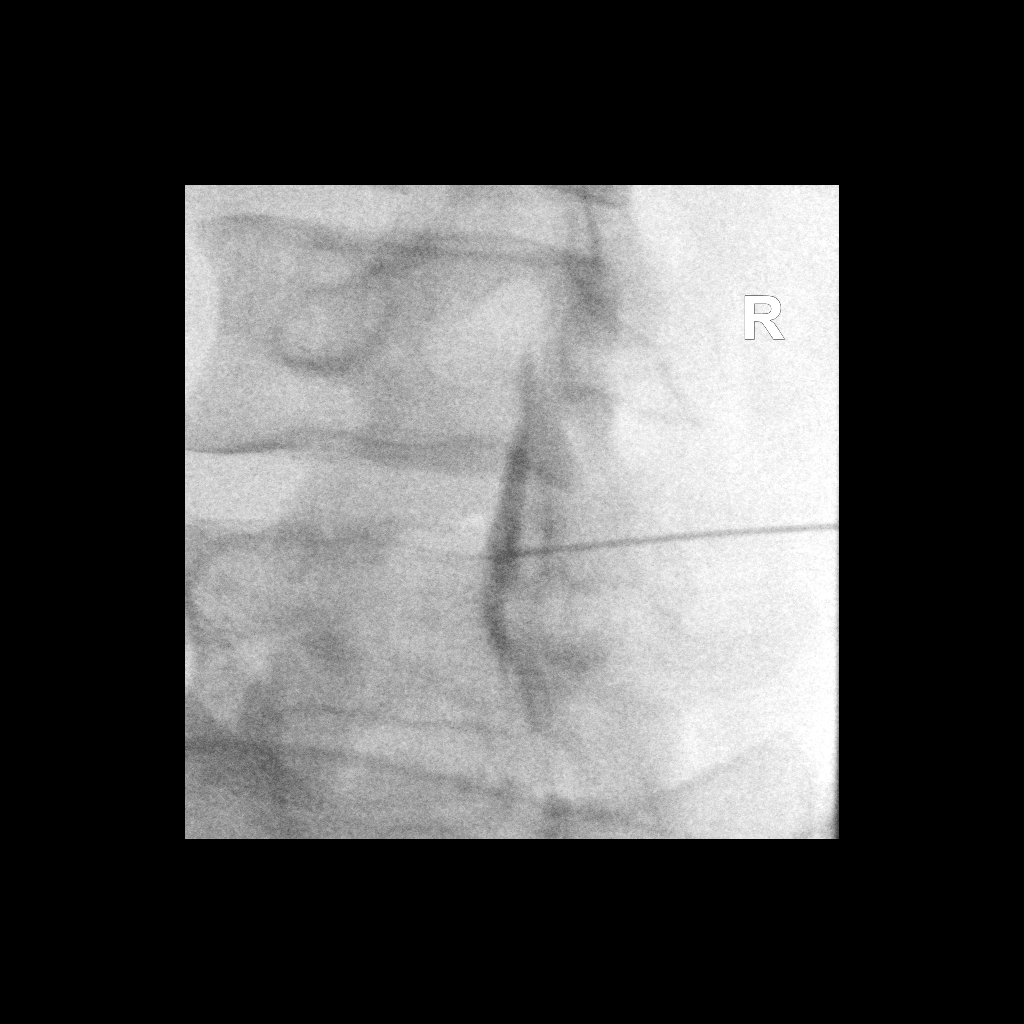

[Series 2: ortho standard · 1 of 1 slices shown (2 of 2)]
[im 1/1]
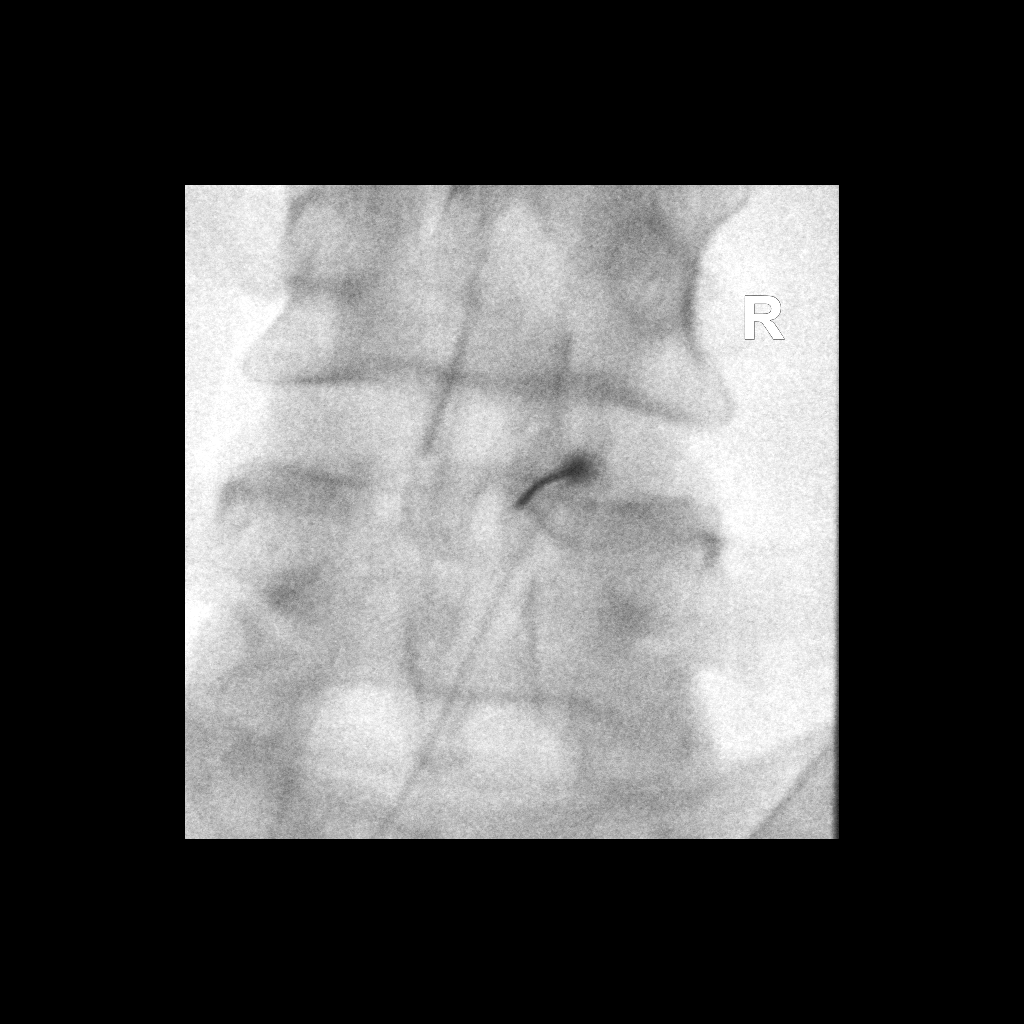

[2 of 2 positions shown; findings below may reference images not displayed]

FLUOROSCOPY TIME:  Radiation Exposure Index (as provided by the
fluoroscopic device): 32.25 microGray*m^2

Fluoroscopy Time (in minutes and seconds):  7 seconds

PROCEDURE:
The procedure, risks, benefits, and alternatives were explained to
the patient. Questions regarding the procedure were encouraged and
answered. The patient understands and consents to the procedure.

LUMBAR EPIDURAL INJECTION:

An interlaminar approach was performed on the right at L4-5. The
overlying skin was cleansed and anesthetized. A 6 inch 20 gauge
epidural needle was advanced using loss-of-resistance technique.

DIAGNOSTIC EPIDURAL INJECTION:

Injection of Isovue-M 200 shows a good epidural pattern with spread
above and below the level of needle placement, primarily on the
right. No vascular opacification is seen.

THERAPEUTIC EPIDURAL INJECTION:

120 Mg of Depo-Medrol mixed with 3 mL of 1% lidocaine were
instilled. The procedure was well-tolerated, and the patient was
discharged thirty minutes following the injection in good condition.

COMPLICATIONS:
None
IMPRESSION: Technically successful lumbar interlaminar epidural injection on the
right at L4-5.

## 2019-02-18 ENCOUNTER — Telehealth: Payer: Self-pay | Admitting: Pharmacy Technician

## 2019-02-18 NOTE — Telephone Encounter (Signed)
[employer] Patient Teacher, English as a foreign language completed.    The patient seems to be uninsured (no insurance found with usual methods) and will need patient assistance for medication.  We can complete the application and will need to meet with the patient for signatures and income documentation.  Venida Jarvis. Nadara Mustard [place] Patient [facility] Phone: [phone] Fax:  [phone]

## 2019-02-22 ENCOUNTER — Telehealth: Payer: Self-pay | Admitting: Pharmacist

## 2019-02-22 ENCOUNTER — Ambulatory Visit: Payer: Medicare HMO | Admitting: Pharmacist

## 2019-02-22 DIAGNOSIS — Z7252 High risk homosexual behavior: Secondary | ICD-10-CM

## 2019-02-22 MED ORDER — EMTRICITABINE-TENOFOVIR AF 200-25 MG PO TABS: 1.0000 | ORAL_TABLET | Freq: Every day | ORAL | 0 refills | Status: DC

## 2019-02-22 NOTE — Telephone Encounter (Signed)
Patient was supposed to come in today for a new PrEP appointment but did not want to ride the bus with the [place] pandemic going on. I normally would not do this, but I agreed to send in 30 days of Descovy (he has been on this for awhile) to last him until he can be seen in May.

## 2019-02-27 ENCOUNTER — Encounter (HOSPITAL_COMMUNITY): Payer: Self-pay | Admitting: Family Medicine

## 2019-02-27 ENCOUNTER — Other Ambulatory Visit: Payer: Self-pay

## 2019-02-27 ENCOUNTER — Ambulatory Visit (HOSPITAL_COMMUNITY)
Admission: EM | Admit: 2019-02-27 | Discharge: 2019-02-27 | Disposition: A | Discharge status: Home / Self Care | Payer: Medicaid Other | Attending: Family Medicine | Admitting: Family Medicine

## 2019-02-27 ENCOUNTER — Ambulatory Visit (INDEPENDENT_AMBULATORY_CARE_PROVIDER_SITE_OTHER): Payer: Medicaid Other

## 2019-02-27 DIAGNOSIS — M722 Plantar fascial fibromatosis: Secondary | ICD-10-CM

## 2019-02-27 MED ORDER — METHYLPREDNISOLONE 4 MG PO TABS: 4.0000 mg | ORAL_TABLET | Freq: Two times a day (BID) | ORAL | 1 refills | Status: DC

## 2019-02-27 MED ORDER — METHYLPREDNISOLONE SODIUM SUCC 125 MG IJ SOLR
INTRAMUSCULAR | Status: AC
  Filled 2019-02-27: qty 2

## 2019-02-27 MED ORDER — METHYLPREDNISOLONE SODIUM SUCC 125 MG IJ SOLR
60.0000 mg | Freq: Once | INTRAMUSCULAR | Status: AC
  Administered 2019-02-27: 60 mg via INTRAMUSCULAR

## 2019-02-27 NOTE — ED Triage Notes (Signed)
Per pt he has been having right foot pain for about 3 days that runs up his calf. Pt says it is in the center of his foot. No injuries.

## 2019-02-27 NOTE — ED Provider Notes (Signed)
[place]    CSN: [id number] Arrival date & time: 02/27/19  1004     History   Chief Complaint Chief Complaint  Patient presents with  . Foot Pain    HPI FLORENCIO HOLLIBAUGH is a 43 y.o. male.   This is the initial [facility] visit for this 43 yo man presenting with right foot pain. He has been having right foot pain for about 4 days that runs up his calf. Pt says it is in the center of his foot. No injuries.   No past h/o similar pain.  No new activities.  Recently moved from [place].  Patient says the only meds he is taking are gabapentin (back), Protonix, and prednisone.  The latter was prescribed by a local doctor 3 days ago.  Pain is 8/10 when at rest, but worse with weight bearing.  He has tried tylenol and ibuprofen without significant relief     Past Medical History:  Diagnosis Date  . Anxiety   . Asthma   . Bipolar 1 disorder ([place])   . Chronic back pain   . Complication of anesthesia    Anxiety when he awaken with ET tube still in place  . GERD (gastroesophageal reflux disease)   . Hyperlipidemia   . Hypoventilation syndrome   . Left testicular torsion    "20 years ago"  . Lumbar radiculopathy   . Migraines   . Schizoaffective disorder ([place])    [place]    Patient Active Problem List   Diagnosis Date Noted  . Auditory hallucinations 01/20/2019  . Rectal bleeding 04/14/2018  . Dysphagia 12/24/2017  . Schizophrenia ([place]) 05/28/2016  . Abdominal pain, epigastric 02/04/2013  . Constipation 12/02/2012  . Abdominal pain 11/22/2012  . INSOMNIA 04/26/2009  . BACK PAIN, LUMBAR 03/20/2009  . BIPOLAR AFFECTIVE DISORDER 02/17/2009  . OTHER ENTHESOPATHY OF ANKLE AND TARSUS 10/05/2008  . GERD 09/16/2008  . PERSONAL HISTORY OF SCHIZOPHRENIA 05/27/2008  . ESOPHAGITIS, REFLUX 03/23/2008  . HIATAL HERNIA 03/23/2008  . HYPERLIPIDEMIA 01/07/2008  . MORBID OBESITY 01/07/2008  . ANXIETY DEPRESSION 01/07/2008  . TOBACCO ABUSE 01/07/2008  . HYPERTENSION  01/07/2008  . ALLERGIC RHINITIS 01/07/2008  . IBS 01/07/2008    Past Surgical History:  Procedure Laterality Date  . ANKLE SURGERY    . APPENDECTOMY    . BIOPSY  01/15/2018   Procedure: BIOPSY;  Surgeon: Daneil Dolin, MD;  Location: [facility];  Service: Endoscopy;;  gastric  . CHOLECYSTECTOMY    . ESOPHAGOGASTRODUODENOSCOPY  03/17/2008   RMR: A tiny distal esophageal erosions consistent with mild  erosive reflux esophagitis, otherwise normal esophagus, small hiatal hernia, minimally polypoid antral mucosa with doubtful clinical was significance.  Otherwise, normal stomach, patent pylorus, and normal D1-D2  . ESOPHAGOGASTRODUODENOSCOPY (EGD) WITH PROPOFOL N/A 02/18/2013   Procedure: ESOPHAGOGASTRODUODENOSCOPY (EGD) WITH PROPOFOL;  Surgeon: Daneil Dolin, MD;  Location: [facility] ORS;  Service: Endoscopy;  Laterality: N/A;  . ESOPHAGOGASTRODUODENOSCOPY (EGD) WITH PROPOFOL N/A 01/15/2018   Procedure: ESOPHAGOGASTRODUODENOSCOPY (EGD) WITH PROPOFOL;  Surgeon: Daneil Dolin, MD;  Location: [facility];  Service: Endoscopy;  Laterality: N/A;  2:15pm  . MALONEY DILATION N/A 01/15/2018   Procedure: Venia Minks DILATION;  Surgeon: Daneil Dolin, MD;  Location: [facility];  Service: Endoscopy;  Laterality: N/A;  . NASAL SINUS SURGERY  01/24/2012   Procedure: ENDOSCOPIC SINUS SURGERY;  Surgeon: Izora Gala, MD;  Location: [facility];  Service: ENT;  Laterality: Left;  left ethmoidectomy, maxillary, frontal  .  UMBILICAL HERNIA REPAIR         Home Medications    Prior to Admission medications   Medication Sig Start Date End Date Taking? Authorizing Provider  emtricitabine-tenofovir AF (DESCOVY) 200-25 MG tablet Take 1 tablet by mouth daily. 02/22/19   Kuppelweiser, Cassie L, RPH-CPP  gabapentin (NEURONTIN) 300 MG capsule Take 600 mg by mouth 3 (three) times daily. 10/31/18   [provider]  gabapentin (NEURONTIN) 600 MG tablet Take 1-3 tablets by mouth 3 (three) times daily. 600mg  in am, 600mg   in afternoon, 1800mg  at bedtime 04/01/18   [provider]  methylPREDNISolone (MEDROL) 4 MG tablet Take 1 tablet (4 mg total) by mouth 2 (two) times daily. 02/27/19   Robyn Haber, MD  NONFORMULARY OR COMPOUNDED ITEM Apply topically. Nifedipine .2% in Lidocaine 4% ointment 10/30/18   [provider]  pantoprazole (PROTONIX) 40 MG tablet Take 1 tablet (40 mg total) by mouth 2 (two) times daily before a meal. 03/04/18   Mahala Menghini, PA-C  pantoprazole (PROTONIX) 40 MG tablet Take 40 mg by mouth daily. 10/03/18   [provider]    Family History Family History  Problem Relation Age of Onset  . Anesthesia problems Mother   . Colon cancer Maternal Grandfather   . Crohn's disease Maternal Grandfather   . Gastric cancer Paternal Uncle   . Esophageal cancer Neg Hx     Social History Social History   Tobacco Use  . Smoking status: Current Every Day Smoker    Packs/day: 1.00    Years: 22.00    Pack years: 22.00    Types: Cigarettes  . Smokeless tobacco: Never Used  Substance Use Topics  . Alcohol use: Yes    Comment: occ  . Drug use: No     Allergies   Compazine; Imitrex [sumatriptan base]; Ketorolac tromethamine; Prochlorperazine; Risperidone; Sumatriptan; Risperidone and related; and Risperidone   Review of Systems Review of Systems   Physical Exam Triage Vital Signs ED Triage Vitals  Enc Vitals Group     BP      Pulse      Resp      Temp      Temp src      SpO2      Weight      Height      Head Circumference      Peak Flow      Pain Score      Pain Loc      Pain Edu?      Excl. in [place]?    No data found.  Updated Vital Signs BP 126/85 (BP Location: Right Arm)   Pulse 80   Temp 98.3 F (36.8 C) (Oral)   Resp 16   SpO2 98%    Physical Exam Vitals signs and nursing note reviewed.  Constitutional:      General: He is not in acute distress.    Appearance: Normal appearance. He is obese. He is not ill-appearing.  HENT:      Head: Normocephalic.     Mouth/Throat:     Mouth: Mucous membranes are moist.  Neck:     Musculoskeletal: Normal range of motion and neck supple.  Cardiovascular:     Rate and Rhythm: Normal rate.  Pulmonary:     Effort: Pulmonary effort is normal.  Musculoskeletal:        General: Tenderness present. No swelling, deformity or signs of injury.     Comments: Normal appearing  Right foot.  Tender arch and heel with light touch.  Skin:    General: Skin is warm and dry.  Neurological:     General: No focal deficit present.     Mental Status: He is alert and oriented to person, place, and time.  Psychiatric:        Mood and Affect: Mood normal.      UC Treatments / Results  Labs (all labs ordered are listed, but only abnormal results are displayed) Labs Reviewed - No data to display  EKG None  Radiology Dg Foot Complete Right  Result Date: 02/27/2019 CLINICAL DATA:  Per patient having right foot pain x 3 days. Most of pain is under bottom of foot. No injury. Constant pain but more when walking. No previous surgery.Smoker-1ppdspontaneous plantar foot pain c/w plantar fasciities x 3 days EXAM: RIGHT FOOT COMPLETE - 3+ VIEW COMPARISON:  None. FINDINGS: No fracture or dislocation of mid foot or forefoot. The phalanges are normal. The calcaneus is normal. No soft tissue abnormality. Enthesopathic spurring along the plantar aspect of the calcaneus. IMPRESSION: 1.  No acute osseous abnormality. 2. Mild plantar spurring. Electronically Signed   By: Suzy Bouchard M.D.   On: 02/27/2019 10:43    Procedures Procedures (including critical care time)  Medications Ordered in UC Medications  methylPREDNISolone sodium succinate (SOLU-MEDROL) 125 mg/2 mL injection 60 mg (has no administration in time range)    Initial Impression / Assessment and Plan / UC Course  I have reviewed the triage vital signs and the nursing notes.  Pertinent labs & imaging results that were available during my  care of the patient were reviewed by me and considered in my medical decision making (see chart for details).    Final Clinical Impressions(s) / UC Diagnoses   Final diagnoses:  Plantar fasciitis of right foot   Discharge Instructions   None    ED Prescriptions    Medication Sig Dispense Auth. Provider   methylPREDNISolone (MEDROL) 4 MG tablet Take 1 tablet (4 mg total) by mouth 2 (two) times daily. 10 tablet Robyn Haber, MD     Controlled Substance Prescriptions [place] Controlled Substance Registry consulted? Not Applicable   Robyn Haber, MD 02/27/19 1053

## 2019-03-04 ENCOUNTER — Other Ambulatory Visit: Payer: Self-pay | Admitting: Behavioral Health

## 2019-03-04 DIAGNOSIS — Z7252 High risk homosexual behavior: Secondary | ICD-10-CM

## 2019-03-04 MED ORDER — EMTRICITABINE-TENOFOVIR AF 200-25 MG PO TABS: 1.0000 | ORAL_TABLET | Freq: Every day | ORAL | 0 refills | Status: DC

## 2019-03-19 IMAGING — DX DG ANKLE COMPLETE 3+V*R*
3 series · 3 of 3 positions shown · non-contrast
Comparison: None.

CLINICAL DATA: Pain

EXAM:
RIGHT ANKLE - COMPLETE 3+ VIEW

[ankle ap]
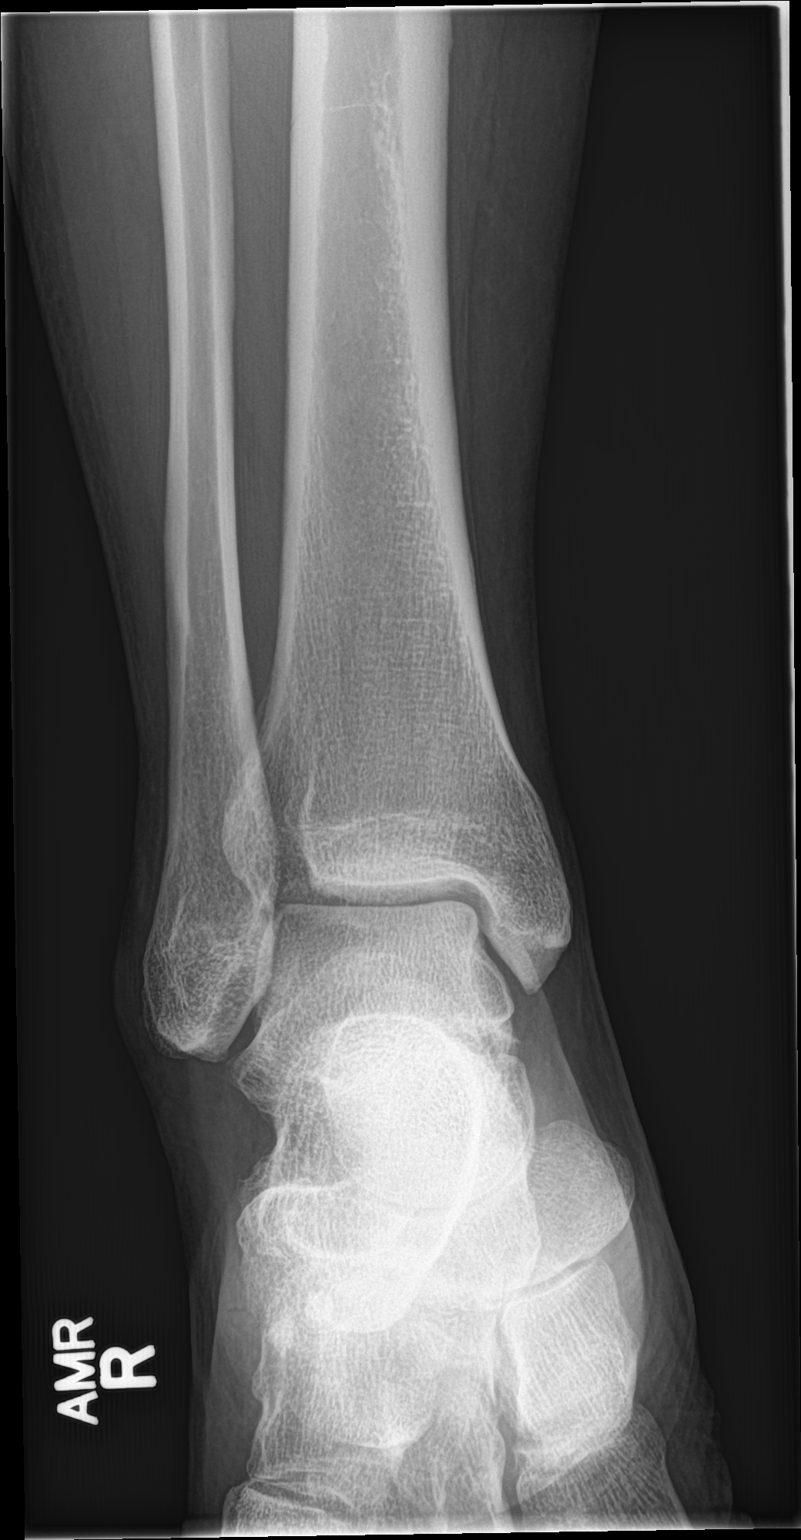

[ankle obl]
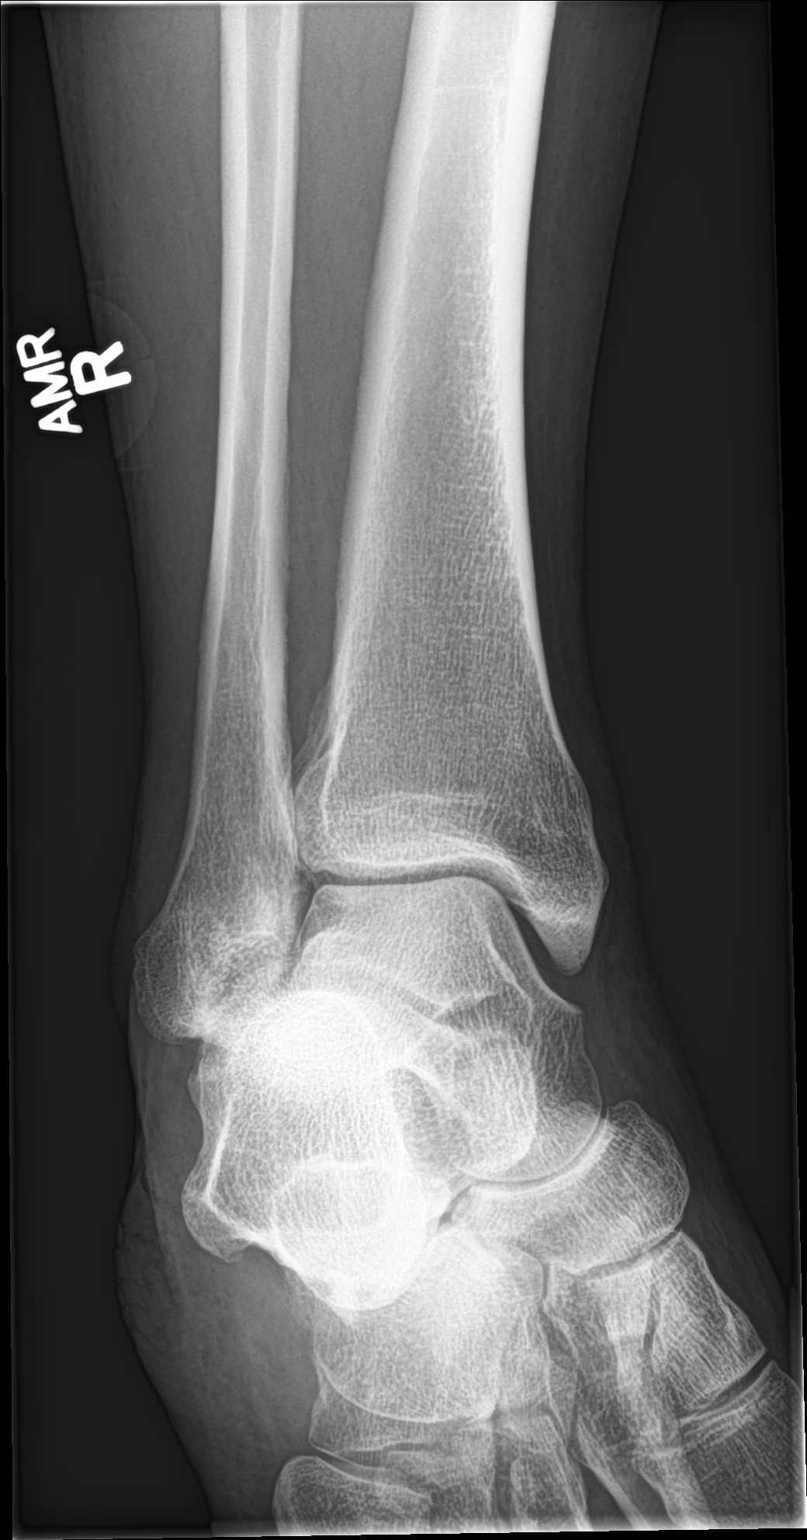

[ankle lat]
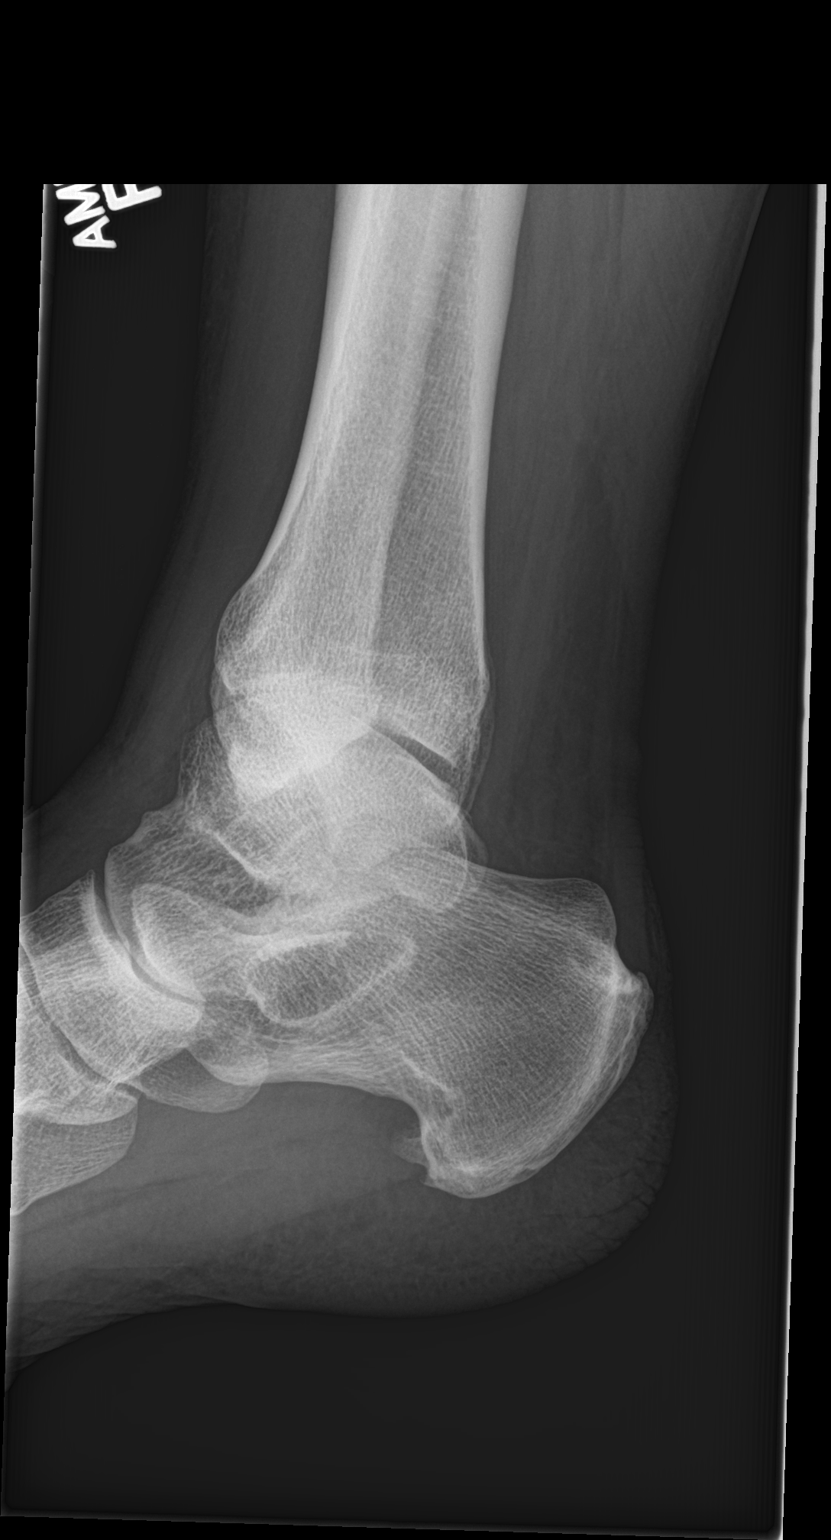

[3 of 3 positions shown; findings below may reference images not displayed]

FINDINGS: No fracture or dislocation is seen.

The ankle mortise is intact.

The base of the fifth metatarsal is unremarkable.

Small plantar calcaneal enthesophyte.
IMPRESSION: No fracture or dislocation is seen.

## 2019-03-24 ENCOUNTER — Ambulatory Visit: Payer: Self-pay | Admitting: Pharmacist

## 2019-03-29 ENCOUNTER — Ambulatory Visit (INDEPENDENT_AMBULATORY_CARE_PROVIDER_SITE_OTHER): Payer: Medicare Other | Admitting: Family Medicine

## 2019-03-29 ENCOUNTER — Encounter: Payer: Self-pay | Admitting: Family Medicine

## 2019-03-29 ENCOUNTER — Other Ambulatory Visit: Payer: Self-pay

## 2019-03-29 ENCOUNTER — Telehealth: Payer: Self-pay | Admitting: *Deleted

## 2019-03-29 VITALS — Resp 16

## 2019-03-29 DIAGNOSIS — M5441 Lumbago with sciatica, right side: Secondary | ICD-10-CM

## 2019-03-29 DIAGNOSIS — E782 Mixed hyperlipidemia: Secondary | ICD-10-CM

## 2019-03-29 DIAGNOSIS — F3130 Bipolar disorder, current episode depressed, mild or moderate severity, unspecified: Secondary | ICD-10-CM

## 2019-03-29 DIAGNOSIS — F17200 Nicotine dependence, unspecified, uncomplicated: Secondary | ICD-10-CM | POA: Diagnosis not present

## 2019-03-29 DIAGNOSIS — K219 Gastro-esophageal reflux disease without esophagitis: Secondary | ICD-10-CM

## 2019-03-29 DIAGNOSIS — G8929 Other chronic pain: Secondary | ICD-10-CM

## 2019-03-29 MED ORDER — PANTOPRAZOLE SODIUM 40 MG PO TBEC: 40.0000 mg | DELAYED_RELEASE_TABLET | Freq: Every day | ORAL | 1 refills | Status: AC

## 2019-03-29 MED ORDER — NICOTINE 21 MG/24HR TD PT24: 21.0000 mg | MEDICATED_PATCH | Freq: Every day | TRANSDERMAL | 0 refills | Status: DC

## 2019-03-29 MED ORDER — TRAMADOL HCL 50 MG PO TABS: 50.0000 mg | ORAL_TABLET | Freq: Three times a day (TID) | ORAL | 0 refills | Status: AC | PRN

## 2019-03-29 NOTE — Assessment & Plan Note (Signed)
For now I recommend low-fat diet. We will plan on rechecking lipid panel during CPE.

## 2019-03-29 NOTE — Assessment & Plan Note (Signed)
Problem is well controlled with Protonix 40 mg daily, no changes in current management. GERD precautions also recommended.

## 2019-03-29 NOTE — Assessment & Plan Note (Signed)
He is requesting at least a week supply of tramadol, 21 tablets sent to his pharmacy. He understands I will not refill medication. We discussed current guidelines in regard to opioid use for chronic pain management. Referral to pain clinic was placed, explained that it may take a few days and even weeks before appointment can be arranged. He does not need a prescription for gabapentin, he states that he is still taking it but has not had a refill since 04/2018.

## 2019-03-29 NOTE — Assessment & Plan Note (Signed)
Currently he has not on pharmacologic treatment. Name and phone # of several mental health providers in the area given.

## 2019-03-29 NOTE — Progress Notes (Signed)
Virtual Visit via Video Note   I connected with [name] on 03/29/19 at 10:30 AM EDT by a video enabled telemedicine application and verified that I am speaking with the correct person using two identifiers.  Location patient: home Location provider:home office Persons participating in the virtual visit: patient, provider  I discussed the limitations of evaluation and management by telemedicine and the availability of in person appointments. The patient expressed understanding and agreed to proceed.   HPI: He is a 43 yo male with hx of HLD,chronic pain,lower back DDD,bipolar disorder,and GERD among some;who is establishing care today. He lives with his partner. Last CPE in 2010. Former PCP: Dr Georgiann Cocker.   GERD and hiatal hernia: Currently he is on Protonix 40 mg daily, which has helped with heartburn, nausea, vomiting, and epigastric abdominal pain.  Since he has been taking PPI he has not had GI symptoms. He denies changes in bowel habits, blood in the stool, or melena.  Chronic lower back pain with "sciatic" pain:He is requesting prescription for tramadol, states that he has taking 100 mg 4 times daily as needed for pain. According to patient, he has followed with orthopedist and lower back surgery was recommended but he was not ready to do so. He is on gabapentin 600 mg 3 times daily, which does not help, last prescription in 04/2018. He denies saddle anesthesia or urinary/bowel incontinence. Lower back pain is constant, severe, radiates to right lower extremity. Exacerbated by movement and alleviated by rest.  He is reporting history of bipolar affective disorder, depression, and anxiety.  Currently he is going to [facility] back would like to see a different psychiatrist. Currently he is not taking medication. He denies suicidal thoughts. He is on disability due to psychiatry disease.  Tobacco use disorder, in the past he quit smoking with nicotine patches, he would like to try  again. He has been a smoker for many years, 1-2 PPD. He would like to quit because he already noted intermittent episodes of productive cough, denies hemoptysis. Negative for chest pain, dyspnea, wheezing, or abnormal weight loss.  Homosexual behavior, he is no longer on HIV prophylactic medication.  According to patient, now he has a stable partner, both screened for HIV and both negative.  He is due for his second hepatitis B vaccine.  He had labs done recently, noted mildly elevated ALT at 58. He denies high alcohol intake.  Lab Results  Component Value Date   ALT 58 (H) 01/19/2019   AST 20 01/19/2019   ALKPHOS 116 01/19/2019   BILITOT 0.5 01/19/2019   Hyperlipidemia, he is not on pharmacologic treatment. Not very consistent with low-fat diet.  Lab Results  Component Value Date   CHOL 231 (H) 03/13/2009   HDL 41 03/13/2009   LDLCALC 148 (H) 03/13/2009   TRIG 210 (H) 03/13/2009   CHOLHDL 5.6 Ratio 03/13/2009    ROS: See pertinent positives and negatives per HPI.  Past Medical History:  Diagnosis Date  . Anxiety   . Asthma   . Bipolar 1 disorder ([place])   . Chronic back pain   . Complication of anesthesia    Anxiety when he awaken with ET tube still in place  . GERD (gastroesophageal reflux disease)   . Hyperlipidemia   . Hypoventilation syndrome   . Left testicular torsion    "20 years ago"  . Lumbar radiculopathy   . Migraines   . Schizoaffective disorder ([place])    Day [place]    Past Surgical History:  Procedure Laterality Date  . ANKLE SURGERY    . APPENDECTOMY    . BIOPSY  01/15/2018   Procedure: BIOPSY;  Surgeon: Daneil Dolin, MD;  [facility];  Service: Endoscopy;;  gastric  . CHOLECYSTECTOMY    . ESOPHAGOGASTRODUODENOSCOPY  03/17/2008   RMR: A tiny distal esophageal erosions consistent with mild  erosive reflux esophagitis, otherwise normal esophagus, small hiatal hernia, minimally polypoid antral mucosa with doubtful clinical was  significance.  Otherwise, normal stomach, patent pylorus, and normal D1-D2  . ESOPHAGOGASTRODUODENOSCOPY (EGD) WITH PROPOFOL N/A 02/18/2013   Procedure: ESOPHAGOGASTRODUODENOSCOPY (EGD) WITH PROPOFOL;  Surgeon: Daneil Dolin, MD;  Location: [facility];  Service: Endoscopy;  Laterality: N/A;  . ESOPHAGOGASTRODUODENOSCOPY (EGD) WITH PROPOFOL N/A 01/15/2018   Procedure: ESOPHAGOGASTRODUODENOSCOPY (EGD) WITH PROPOFOL;  Surgeon: Daneil Dolin, MD;  [facility];  Service: Endoscopy;  Laterality: N/A;  2:15pm  . MALONEY DILATION N/A 01/15/2018   Procedure: Venia Minks DILATION;  Surgeon: Daneil Dolin, MD;  [facility];  Service: Endoscopy;  Laterality: N/A;  . NASAL SINUS SURGERY  01/24/2012   Procedure: ENDOSCOPIC SINUS SURGERY;  Surgeon: Izora Gala, MD;  Location: [facility] OR;  Service: ENT;  Laterality: Left;  left ethmoidectomy, maxillary, frontal  . UMBILICAL HERNIA REPAIR      Family History  Problem Relation Age of Onset  . Anesthesia problems Mother   . Colon cancer Maternal Grandfather   . Crohn's disease Maternal Grandfather   . Gastric cancer Paternal Uncle   . Esophageal cancer Neg Hx     Social History   Socioeconomic History  . Marital status: Divorced    Spouse name: Not on file  . Number of children: Not on file  . Years of education: Not on file  . Highest education level: Not on file  Occupational History  . Not on file  Social Needs  . Financial resource strain: Not on file  . Food insecurity:    Worry: Not on file    Inability: Not on file  . Transportation needs:    Medical: Not on file    Non-medical: Not on file  Tobacco Use  . Smoking status: Current Every Day Smoker    Packs/day: 1.00    Years: 22.00    Pack years: 22.00    Types: Cigarettes  . Smokeless tobacco: Never Used  Substance and Sexual Activity  . Alcohol use: Yes    Comment: occ  . Drug use: No  . Sexual activity: Not on file  Lifestyle  . Physical activity:    Days per  week: Not on file    Minutes per session: Not on file  . Stress: Not on file  Relationships  . Social connections:    Talks on phone: Not on file    Gets together: Not on file    Attends religious service: Not on file    Active member of club or organization: Not on file    Attends meetings of clubs or organizations: Not on file    Relationship status: Not on file  . Intimate partner violence:    Fear of current or ex partner: Not on file    Emotionally abused: Not on file    Physically abused: Not on file    Forced sexual activity: Not on file  Other Topics Concern  . Not on file  Social History Narrative  . Not on file      Current Outpatient Medications:  .  gabapentin (  NEURONTIN) 300 MG capsule, Take 600 mg by mouth 3 (three) times daily., Disp: , Rfl:  .  gabapentin (NEURONTIN) 600 MG tablet, Take 1-3 tablets by mouth 3 (three) times daily. 600mg  in am, 600mg  in afternoon, 1800mg  at bedtime, Disp: , Rfl:  .  nicotine (NICODERM CQ - DOSED IN MG/24 HOURS) 21 mg/24hr patch, Place 1 patch (21 mg total) onto the skin daily., Disp: 28 patch, Rfl: 0 .  NONFORMULARY OR COMPOUNDED ITEM, Apply topically. Nifedipine .2% in Lidocaine 4% ointment, Disp: , Rfl:  .  pantoprazole (PROTONIX) 40 MG tablet, Take 1 tablet (40 mg total) by mouth daily., Disp: 90 tablet, Rfl: 1 .  traMADol (ULTRAM) 50 MG tablet, Take 1 tablet (50 mg total) by mouth every 8 (eight) hours as needed for up to 7 days for severe pain., Disp: 21 tablet, Rfl: 0  EXAM:  VITALS per patient if applicable:Resp 16   GENERAL: alert, oriented, appears well and in no acute distress  HEENT: atraumatic, normocephalic, conjunttiva clear, no obvious facial abnormalities on inspection.   NECK: normal movements of the head and neck  LUNGS: on inspection no signs of respiratory distress, breathing rate appears normal, no obvious gross SOB, gasping or wheezing  CV: no obvious cyanosis  MS: moves all visible extremities without  noticeable abnormality  PSYCH/NEURO: pleasant and cooperative, no obvious depression or anxiety, speech and thought processing grossly intact  ASSESSMENT AND PLAN:  Discussed the following assessment and plan: Orders Placed This Encounter  Procedures  . Ambulatory referral to [facility] Currently he has not on pharmacologic treatment. Name and phone # of several mental health providers in the area given.  TOBACCO ABUSE Adverse effects of tobacco use and benefits of smoking cessation discussed. He has tolerated nicotine patches in the past and it has helped, we discussed some side effects. Nicotine patches 21 mg daily sent to his pharmacy.  We will try to titrate dose down to 14 and 7 mg as tolerated.  GERD Problem is well controlled with Protonix 40 mg daily, no changes in current management. GERD precautions also recommended.  BACK PAIN, LUMBAR He is requesting at least a week supply of tramadol, 21 tablets sent to his pharmacy. He understands I will not refill medication. We discussed current guidelines in regard to opioid use for chronic pain management. Referral to pain clinic was placed, explained that it may take a few days and even weeks before appointment can be arranged. He does not need a prescription for gabapentin, he states that he is still taking it but has not had a refill since 04/2018.  Hyperlipidemia, mixed For now I recommend low-fat diet. We will plan on rechecking lipid panel during CPE.     I discussed the assessment and treatment plan with the patient. The patient was provided an opportunity to ask questions and all were answered. The patient agreed with the plan and demonstrated an understanding of the instructions.   Return in about 6 months (around 09/29/2019) for cpe.     [place], MD

## 2019-03-29 NOTE — Telephone Encounter (Signed)
Copied from [place] [id number]. Topic: General - Other >> Mar 29, 2019  8:20 AM Carolyn Stare wrote: Call to say medicare put Dr Martinique on his insurance card and he called to make an appt as a new pt..  Please call pt to set up new pt appt    [phone]

## 2019-03-29 NOTE — Assessment & Plan Note (Signed)
Adverse effects of tobacco use and benefits of smoking cessation discussed. He has tolerated nicotine patches in the past and it has helped, we discussed some side effects. Nicotine patches 21 mg daily sent to his pharmacy.  We will try to titrate dose down to 14 and 7 mg as tolerated.

## 2019-03-30 ENCOUNTER — Telehealth: Payer: Self-pay | Admitting: Family Medicine

## 2019-03-30 NOTE — Telephone Encounter (Signed)
Message sent to Dr. Jordan for review and approval. 

## 2019-03-30 NOTE — Telephone Encounter (Signed)
Copied from [place] [phone]. Topic: Quick Communication - Rx Refill/Question >> Mar 30, 2019  8:05 AM Loma Boston wrote: Pt saw Dr Martinique as new pt yesterday 5/18, called in this am wanting an inhaler and something for congestion, says he is not sick but can not get congestion out of his lungs with any productive cough. He would like something to be called in to  [facility] [phone] (Phone) [phone] (Fax   If you have any questions his # is [phone]

## 2019-03-31 ENCOUNTER — Other Ambulatory Visit: Payer: Self-pay | Admitting: Family Medicine

## 2019-03-31 MED ORDER — ALBUTEROL SULFATE HFA 108 (90 BASE) MCG/ACT IN AERS: 2.0000 | INHALATION_SPRAY | Freq: Four times a day (QID) | RESPIRATORY_TRACT | 0 refills | Status: DC | PRN

## 2019-03-31 NOTE — Telephone Encounter (Signed)
Cough and congestion could be related to smoking, ? COPD. Albuterol inh 2 puff every 6 hours for a week then as needed for wheezing or shortness of breath.  Rx sent. OTC plain Mucinex may also help. If symptom persist f/u appt is needed,ideally Friday in the office.  Thanks, BJ

## 2019-04-01 NOTE — Telephone Encounter (Signed)
Tried calling patient, answered phone and then hung. Was calling to inform patient of providers recommendations.

## 2019-04-02 ENCOUNTER — Other Ambulatory Visit: Payer: Self-pay

## 2019-04-02 ENCOUNTER — Encounter (HOSPITAL_COMMUNITY): Payer: Self-pay | Admitting: Emergency Medicine

## 2019-04-02 ENCOUNTER — Ambulatory Visit (HOSPITAL_COMMUNITY)
Admission: EM | Admit: 2019-04-02 | Discharge: 2019-04-02 | Disposition: A | Discharge status: Home / Self Care | Payer: Medicare Other | Attending: Internal Medicine | Admitting: Internal Medicine

## 2019-04-02 DIAGNOSIS — F17210 Nicotine dependence, cigarettes, uncomplicated: Secondary | ICD-10-CM

## 2019-04-02 DIAGNOSIS — J209 Acute bronchitis, unspecified: Secondary | ICD-10-CM

## 2019-04-02 MED ORDER — ALBUTEROL SULFATE HFA 108 (90 BASE) MCG/ACT IN AERS: 2.0000 | INHALATION_SPRAY | Freq: Four times a day (QID) | RESPIRATORY_TRACT | 0 refills | Status: DC | PRN

## 2019-04-02 MED ORDER — PREDNISONE 20 MG PO TABS: 40.0000 mg | ORAL_TABLET | Freq: Every day | ORAL | 0 refills | Status: AC

## 2019-04-02 NOTE — ED Provider Notes (Addendum)
[facility]    CSN: [id number] Arrival date & time: 04/02/19  0813     History   Chief Complaint Chief Complaint  Patient presents with  . Cough    HPI [name] is a 43 y.o. male with a history of asthma comes to urgent care with complaints of cough, shortness of breath of 3 days duration.  Patient says that the symptoms started insidiously and is gotten progressively worse.  He denies any chest pain or chest pressure.  He continues to smoke.  No wheezing.  No runny nose, fever or chills.  No sick contacts.  No loss of taste or smell.  No diarrhea.  No relieving agents.  No sputum production.  HPI  Past Medical History:  Diagnosis Date  . Anxiety   . Asthma   . Bipolar 1 disorder ([place])   . Chronic back pain   . Complication of anesthesia    Anxiety when he awaken with ET tube still in place  . GERD (gastroesophageal reflux disease)   . Hyperlipidemia   . Hypoventilation syndrome   . Left testicular torsion    "20 years ago"  . Lumbar radiculopathy   . Migraines   . Schizoaffective disorder ([place])    Day [place]    Patient Active Problem List   Diagnosis Date Noted  . Auditory hallucinations 01/20/2019  . Rectal bleeding 04/14/2018  . Dysphagia 12/24/2017  . Schizophrenia ([place]) 05/28/2016  . Abdominal pain, epigastric 02/04/2013  . Constipation 12/02/2012  . Abdominal pain 11/22/2012  . INSOMNIA 04/26/2009  . BACK PAIN, LUMBAR 03/20/2009  . BIPOLAR AFFECTIVE DISORDER 02/17/2009  . OTHER ENTHESOPATHY OF ANKLE AND TARSUS 10/05/2008  . GERD 09/16/2008  . PERSONAL HISTORY OF SCHIZOPHRENIA 05/27/2008  . ESOPHAGITIS, REFLUX 03/23/2008  . HIATAL HERNIA 03/23/2008  . Hyperlipidemia, mixed 01/07/2008  . MORBID OBESITY 01/07/2008  . ANXIETY DEPRESSION 01/07/2008  . TOBACCO ABUSE 01/07/2008  . HYPERTENSION 01/07/2008  . ALLERGIC RHINITIS 01/07/2008  . IBS 01/07/2008    Past Surgical History:  Procedure Laterality Date  . ANKLE SURGERY    .  APPENDECTOMY    . BIOPSY  01/15/2018   Procedure: BIOPSY;  Surgeon: Daneil Dolin, MD;  [facility];  Service: Endoscopy;;  gastric  . CHOLECYSTECTOMY    . ESOPHAGOGASTRODUODENOSCOPY  03/17/2008   RMR: A tiny distal esophageal erosions consistent with mild  erosive reflux esophagitis, otherwise normal esophagus, small hiatal hernia, minimally polypoid antral mucosa with doubtful clinical was significance.  Otherwise, normal stomach, patent pylorus, and normal D1-D2  . ESOPHAGOGASTRODUODENOSCOPY (EGD) WITH PROPOFOL N/A 02/18/2013   Procedure: ESOPHAGOGASTRODUODENOSCOPY (EGD) WITH PROPOFOL;  Surgeon: Daneil Dolin, MD;  Location: [facility];  Service: Endoscopy;  Laterality: N/A;  . ESOPHAGOGASTRODUODENOSCOPY (EGD) WITH PROPOFOL N/A 01/15/2018   Procedure: ESOPHAGOGASTRODUODENOSCOPY (EGD) WITH PROPOFOL;  Surgeon: Daneil Dolin, MD;  [facility];  Service: Endoscopy;  Laterality: N/A;  2:15pm  . MALONEY DILATION N/A 01/15/2018   Procedure: Venia Minks DILATION;  Surgeon: Daneil Dolin, MD;  [facility];  Service: Endoscopy;  Laterality: N/A;  . NASAL SINUS SURGERY  01/24/2012   Procedure: ENDOSCOPIC SINUS SURGERY;  Surgeon: Izora Gala, MD;  Location: [facility];  Service: ENT;  Laterality: Left;  left ethmoidectomy, maxillary, frontal  . UMBILICAL HERNIA REPAIR         Home Medications    Prior to Admission medications   Medication Sig Start Date End Date Taking? Authorizing Provider  albuterol (  VENTOLIN HFA) 108 (90 Base) MCG/ACT inhaler Inhale 2 puffs into the lungs every 6 (six) hours as needed for wheezing or shortness of breath. 04/02/19   Katheryn Culliton, Myrene Galas, MD  gabapentin (NEURONTIN) 300 MG capsule Take 600 mg by mouth 3 (three) times daily. 10/31/18   [provider]  gabapentin (NEURONTIN) 600 MG tablet Take 1-3 tablets by mouth 3 (three) times daily. 600mg  in am, 600mg  in afternoon, 1800mg  at bedtime 04/01/18   [provider]  nicotine  (NICODERM CQ - DOSED IN MG/24 HOURS) 21 mg/24hr patch Place 1 patch (21 mg total) onto the skin daily. 03/29/19   Martinique, Betty G, MD  NONFORMULARY OR COMPOUNDED ITEM Apply topically. Nifedipine .2% in Lidocaine 4% ointment 10/30/18   [provider]  pantoprazole (PROTONIX) 40 MG tablet Take 1 tablet (40 mg total) by mouth daily. 03/29/19   [place], Betty G, MD  predniSONE (DELTASONE) 20 MG tablet Take 2 tablets (40 mg total) by mouth daily for 5 days. 04/02/19 04/07/19  LampteyMyrene Galas, MD  traMADol (ULTRAM) 50 MG tablet Take 1 tablet (50 mg total) by mouth every 8 (eight) hours as needed for up to 7 days for severe pain. 03/29/19 04/05/19  Martinique, Betty G, MD    Family History Family History  Problem Relation Age of Onset  . Anesthesia problems Mother   . Colon cancer Maternal Grandfather   . Crohn's disease Maternal Grandfather   . Gastric cancer Paternal Uncle   . Esophageal cancer Neg Hx     Social History Social History   Tobacco Use  . Smoking status: Current Every Day Smoker    Packs/day: 1.00    Years: 22.00    Pack years: 22.00    Types: Cigarettes  . Smokeless tobacco: Never Used  Substance Use Topics  . Alcohol use: Yes    Comment: occ  . Drug use: No     Allergies   Compazine; Imitrex [sumatriptan base]; Ketorolac tromethamine; Prochlorperazine; Risperidone; Sumatriptan; Risperidone and related; and Risperidone   Review of Systems Review of Systems  Constitutional: Negative.  Negative for activity change and appetite change.  HENT: Positive for rhinorrhea. Negative for hearing loss, mouth sores, postnasal drip, sinus pressure and sore throat.   Respiratory: Positive for cough, chest tightness, shortness of breath and wheezing.   Cardiovascular: Negative for chest pain and palpitations.  Gastrointestinal: Negative.   Genitourinary: Negative for dysuria, frequency and urgency.  Musculoskeletal: Negative.   Skin: Negative.   Neurological: Negative for  dizziness, syncope, weakness and light-headedness.     Physical Exam Triage Vital Signs ED Triage Vitals  Enc Vitals Group     BP 04/02/19 0841 120/77     Pulse Rate 04/02/19 0841 80     Resp 04/02/19 0841 18     Temp 04/02/19 0841 98.6 F (37 C)     Temp Source 04/02/19 0841 Oral     SpO2 04/02/19 0841 97 %     Weight --      Height --      Head Circumference --      Peak Flow --      Pain Score 04/02/19 0842 0     Pain Loc --      Pain Edu? --      Excl. in [place]? --    No data found.  Updated Vital Signs BP 120/77 (BP Location: Right Arm)   Pulse 80   Temp 98.6 F (37 C) (Oral)   Resp  18   SpO2 97%   Visual Acuity Right Eye Distance:   Left Eye Distance:   Bilateral Distance:    Right Eye Near:   Left Eye Near:    Bilateral Near:     Physical Exam Vitals signs and nursing note reviewed.  Constitutional:      General: He is not in acute distress.    Appearance: Normal appearance. He is not ill-appearing.  Neck:     Musculoskeletal: Normal range of motion.  Cardiovascular:     Rate and Rhythm: Normal rate and regular rhythm.     Pulses: Normal pulses.     Heart sounds: Normal heart sounds.  Pulmonary:     Effort: Pulmonary effort is normal. No respiratory distress.     Breath sounds: Wheezing present. No rhonchi or rales.  Abdominal:     General: Bowel sounds are normal.     Palpations: Abdomen is soft.  Musculoskeletal: Normal range of motion.  Skin:    Capillary Refill: Capillary refill takes less than 2 seconds.  Neurological:     General: No focal deficit present.     Mental Status: He is alert and oriented to person, place, and time.      UC Treatments / Results  Labs (all labs ordered are listed, but only abnormal results are displayed) Labs Reviewed - No data to display  EKG None  Radiology No results found.  Procedures Procedures (including critical care time)  Medications Ordered in UC Medications - No data to display   Initial Impression / Assessment and Plan / UC Course  I have reviewed the triage vital signs and the nursing notes.  Pertinent labs & imaging results that were available during my care of the patient were reviewed by me and considered in my medical decision making (see chart for details).     1.  Acute bronchitis with bronchospasm: Albuterol inhaler Prednisone 20 mg orally daily for 5 days No indication for antibiotics  2.  Chronic tobacco use: Smoke cessation advice given.  Patient is in the pre-contemplative state.  Time spent less than 10 minutes.   Final Clinical Impressions(s) / UC Diagnoses   Final diagnoses:  Acute bronchitis with bronchospasm   Discharge Instructions   None    ED Prescriptions    Medication Sig Dispense Auth. Provider   albuterol (VENTOLIN HFA) 108 (90 Base) MCG/ACT inhaler Inhale 2 puffs into the lungs every 6 (six) hours as needed for wheezing or shortness of breath. 1 Inhaler Zanyia Silbaugh, Myrene Galas, MD   predniSONE (DELTASONE) 20 MG tablet Take 2 tablets (40 mg total) by mouth daily for 5 days. 10 tablet Honora Searson, Myrene Galas, MD     Controlled Substance Prescriptions [place] Controlled Substance Registry consulted? No   Chase Picket, MD 04/02/19 2007    Chase Picket, MD 04/02/19 2009

## 2019-04-02 NOTE — ED Triage Notes (Signed)
PT presents to [facility] for assessment of cough, congestion and shortness of breath, especially with laying down, x 2-3 days.

## 2019-04-05 ENCOUNTER — Emergency Department (HOSPITAL_COMMUNITY): Payer: Medicare Other

## 2019-04-05 ENCOUNTER — Encounter (HOSPITAL_COMMUNITY): Payer: Self-pay | Admitting: Emergency Medicine

## 2019-04-05 ENCOUNTER — Emergency Department (HOSPITAL_COMMUNITY)
Admission: EM | Admit: 2019-04-05 | Discharge: 2019-04-05 | Discharge status: Against Medical Advice | Payer: Medicare Other | Attending: Emergency Medicine | Admitting: Emergency Medicine

## 2019-04-05 ENCOUNTER — Other Ambulatory Visit: Payer: Self-pay

## 2019-04-05 DIAGNOSIS — Z20828 Contact with and (suspected) exposure to other viral communicable diseases: Secondary | ICD-10-CM | POA: Insufficient documentation

## 2019-04-05 DIAGNOSIS — J45909 Unspecified asthma, uncomplicated: Secondary | ICD-10-CM | POA: Diagnosis not present

## 2019-04-05 DIAGNOSIS — F17210 Nicotine dependence, cigarettes, uncomplicated: Secondary | ICD-10-CM | POA: Insufficient documentation

## 2019-04-05 DIAGNOSIS — R0789 Other chest pain: Secondary | ICD-10-CM | POA: Diagnosis not present

## 2019-04-05 DIAGNOSIS — R05 Cough: Secondary | ICD-10-CM | POA: Insufficient documentation

## 2019-04-05 DIAGNOSIS — R062 Wheezing: Secondary | ICD-10-CM | POA: Insufficient documentation

## 2019-04-05 DIAGNOSIS — I10 Essential (primary) hypertension: Secondary | ICD-10-CM | POA: Diagnosis not present

## 2019-04-05 DIAGNOSIS — R0602 Shortness of breath: Secondary | ICD-10-CM | POA: Diagnosis present

## 2019-04-05 DIAGNOSIS — Z79899 Other long term (current) drug therapy: Secondary | ICD-10-CM | POA: Insufficient documentation

## 2019-04-05 DIAGNOSIS — R058 Other specified cough: Secondary | ICD-10-CM

## 2019-04-05 LAB — CBC
HCT: 47 % (ref 39.0–52.0)
Hemoglobin: 16.1 g/dL (ref 13.0–17.0)
MCH: 30.6 pg (ref 26.0–34.0)
MCHC: 34.3 g/dL (ref 30.0–36.0)
MCV: 89.2 fL (ref 80.0–100.0)
Platelets: 215 10*3/uL (ref 150–400)
RBC: 5.27 MIL/uL (ref 4.22–5.81)
RDW: 13.5 % (ref 11.5–15.5)
WBC: 7.5 10*3/uL (ref 4.0–10.5)
nRBC: 0 % (ref 0.0–0.2)

## 2019-04-05 LAB — BASIC METABOLIC PANEL
Anion gap: 11 (ref 5–15)
BUN: 8 mg/dL (ref 6–20)
CO2: 21 mmol/L — ABNORMAL LOW (ref 22–32)
Calcium: 9 mg/dL (ref 8.9–10.3)
Chloride: 105 mmol/L (ref 98–111)
Creatinine, Ser: 0.74 mg/dL (ref 0.61–1.24)
GFR calc Af Amer: >60 mL/min (ref >60)
GFR calc non Af Amer: >60 mL/min (ref >60)
Glucose, Bld: 121 mg/dL — ABNORMAL HIGH (ref 70–99)
Potassium: 4.1 mmol/L (ref 3.5–5.1)
Sodium: 137 mmol/L (ref 135–145)

## 2019-04-05 LAB — SARS CORONAVIRUS 2 BY RT PCR (HOSPITAL ORDER, PERFORMED IN ~~LOC~~ HOSPITAL LAB): SARS Coronavirus 2: NEGATIVE

## 2019-04-05 LAB — I-STAT TROPONIN, ED: Troponin i, poc: 0 ng/mL (ref 0.00–0.08)

## 2019-04-05 MED ORDER — ALBUTEROL SULFATE HFA 108 (90 BASE) MCG/ACT IN AERS
4.0000 | INHALATION_SPRAY | Freq: Once | RESPIRATORY_TRACT | Status: AC
  Administered 2019-04-05: 4 via RESPIRATORY_TRACT
  Filled 2019-04-05: qty 6.7

## 2019-04-05 MED ORDER — IPRATROPIUM BROMIDE 0.02 % IN SOLN: 0.5000 mg | Freq: Once | RESPIRATORY_TRACT | Status: DC

## 2019-04-05 MED ORDER — IPRATROPIUM BROMIDE HFA 17 MCG/ACT IN AERS
2.0000 | INHALATION_SPRAY | Freq: Once | RESPIRATORY_TRACT | Status: DC
  Filled 2019-04-05: qty 12.9

## 2019-04-05 NOTE — ED Notes (Addendum)
Patient walked out of the room dressed and asking "where is the door, I'm leaving." He is encouraged to wait for the Provider; patient states "I have an emergency, I have to go." as he briskly walked out of the department.

## 2019-04-05 NOTE — ED Notes (Signed)
Pt states that he hasn't taken his 5 mg valium in 2-3 bc he moved up here from [facility]. Pt states that he normal takes it at least 1-2 times a day.  Pt also states he hasnt taken his Abilify 20mg  2x day. Effexor 75mg  at night 2-3 weeks also.

## 2019-04-05 NOTE — ED Triage Notes (Signed)
Pt co shortness of breath, chest tightness, sweatness, coughing, weezing since 03/31/2019. Pt states he gets nervous and takes anxiety meds but hasnt taken them. Also co nausea/vomiting. Pt states he takes protonix.

## 2019-04-05 NOTE — ED Provider Notes (Signed)
[facility] Provider Note   CSN: [id number] Arrival date & time: 04/05/19  1115    History   Chief Complaint Chief Complaint  Patient presents with   Chest Pain   Shortness of Breath    HPI [name] is a 43 y.o. male.     For past week, pt c/o episodic, non prod cough, sob, chest tightness. Symptoms acute onset, moderate, persistent, constant, felt worse in past couple days. No fevers. Denies specific known ill contacts or known covid+ exposure. +smoker, hx asthma. Recently seen at urgent care and given rx pred/alb. Patient denies leg pain or swelling. No hx dvt or pe. No fam hx premature cad. No exertional cp.   The history is provided by the patient.  Chest Pain  Associated symptoms: cough and shortness of breath   Associated symptoms: no abdominal pain, no back pain, no fever, no headache and no vomiting   Shortness of Breath  Associated symptoms: chest pain and cough   Associated symptoms: no abdominal pain, no fever, no headaches, no neck pain, no rash, no sore throat and no vomiting     Past Medical History:  Diagnosis Date   Anxiety    Asthma    Bipolar 1 disorder (HCC)    Chronic back pain    Complication of anesthesia    Anxiety when he awaken with ET tube still in place   GERD (gastroesophageal reflux disease)    Hyperlipidemia    Hypoventilation syndrome    Left testicular torsion    "20 years ago"   Lumbar radiculopathy    Migraines    Schizoaffective disorder (HCC)    Day Mark    Patient Active Problem List   Diagnosis Date Noted   Auditory hallucinations 01/20/2019   Rectal bleeding 04/14/2018   Dysphagia 12/24/2017   Schizophrenia ([place]) 05/28/2016   Abdominal pain, epigastric 02/04/2013   Constipation 12/02/2012   Abdominal pain 11/22/2012   INSOMNIA 04/26/2009   BACK PAIN, LUMBAR 03/20/2009   BIPOLAR AFFECTIVE DISORDER 02/17/2009   OTHER ENTHESOPATHY OF ANKLE AND  TARSUS 10/05/2008   GERD 09/16/2008   PERSONAL HISTORY OF SCHIZOPHRENIA 05/27/2008   ESOPHAGITIS, REFLUX 03/23/2008   HIATAL HERNIA 03/23/2008   Hyperlipidemia, mixed 01/07/2008   MORBID OBESITY 01/07/2008   ANXIETY DEPRESSION 01/07/2008   TOBACCO ABUSE 01/07/2008   HYPERTENSION 01/07/2008   ALLERGIC RHINITIS 01/07/2008   IBS 01/07/2008    Past Surgical History:  Procedure Laterality Date   ANKLE SURGERY     APPENDECTOMY     BIOPSY  01/15/2018   Procedure: BIOPSY;  Surgeon: Daneil Dolin, MD;  [facility];  Service: Endoscopy;;  gastric   CHOLECYSTECTOMY     ESOPHAGOGASTRODUODENOSCOPY  03/17/2008   RMR: A tiny distal esophageal erosions consistent with mild  erosive reflux esophagitis, otherwise normal esophagus, small hiatal hernia, minimally polypoid antral mucosa with doubtful clinical was significance.  Otherwise, normal stomach, patent pylorus, and normal D1-D2   ESOPHAGOGASTRODUODENOSCOPY (EGD) WITH PROPOFOL N/A 02/18/2013   Procedure: ESOPHAGOGASTRODUODENOSCOPY (EGD) WITH PROPOFOL;  Surgeon: Daneil Dolin, MD;  Location: [facility];  Service: Endoscopy;  Laterality: N/A;   ESOPHAGOGASTRODUODENOSCOPY (EGD) WITH PROPOFOL N/A 01/15/2018   Procedure: ESOPHAGOGASTRODUODENOSCOPY (EGD) WITH PROPOFOL;  Surgeon: Daneil Dolin, MD;  [facility];  Service: Endoscopy;  Laterality: N/A;  2:15pm   MALONEY DILATION N/A 01/15/2018   Procedure: Venia Minks DILATION;  Surgeon: Daneil Dolin, MD;  [facility];  Service:  Endoscopy;  Laterality: N/A;   NASAL SINUS SURGERY  01/24/2012   Procedure: ENDOSCOPIC SINUS SURGERY;  Surgeon: Izora Gala, MD;  Location: [facility];  Service: ENT;  Laterality: Left;  left ethmoidectomy, maxillary, frontal   UMBILICAL HERNIA REPAIR          Home Medications    Prior to Admission medications   Medication Sig Start Date End Date Taking? Authorizing Provider  albuterol (VENTOLIN HFA) 108 (90 Base) MCG/ACT inhaler  Inhale 2 puffs into the lungs every 6 (six) hours as needed for wheezing or shortness of breath. 04/02/19   Lamptey, Myrene Galas, MD  gabapentin (NEURONTIN) 300 MG capsule Take 600 mg by mouth 3 (three) times daily. 10/31/18   [provider]  gabapentin (NEURONTIN) 600 MG tablet Take 1-3 tablets by mouth 3 (three) times daily. 600mg  in am, 600mg  in afternoon, 1800mg  at bedtime 04/01/18   [provider]  nicotine (NICODERM CQ - DOSED IN MG/24 HOURS) 21 mg/24hr patch Place 1 patch (21 mg total) onto the skin daily. 03/29/19   Martinique, Betty G, MD  NONFORMULARY OR COMPOUNDED ITEM Apply topically. Nifedipine .2% in Lidocaine 4% ointment 10/30/18   [provider]  pantoprazole (PROTONIX) 40 MG tablet Take 1 tablet (40 mg total) by mouth daily. 03/29/19   Martinique, Betty G, MD  predniSONE (DELTASONE) 20 MG tablet Take 2 tablets (40 mg total) by mouth daily for 5 days. 04/02/19 04/07/19  LampteyMyrene Galas, MD  traMADol (ULTRAM) 50 MG tablet Take 1 tablet (50 mg total) by mouth every 8 (eight) hours as needed for up to 7 days for severe pain. 03/29/19 04/05/19  Martinique, Betty G, MD    Family History Family History  Problem Relation Age of Onset   Anesthesia problems Mother    Colon cancer Maternal Grandfather    Crohn's disease Maternal Grandfather    Gastric cancer Paternal Uncle    Esophageal cancer Neg Hx     Social History Social History   Tobacco Use   Smoking status: Current Every Day Smoker    Packs/day: 1.00    Years: 22.00    Pack years: 22.00    Types: Cigarettes   Smokeless tobacco: Never Used  Substance Use Topics   Alcohol use: Yes    Comment: occ   Drug use: No     Allergies   Compazine; Imitrex [sumatriptan base]; Ketorolac tromethamine; Prochlorperazine; Risperidone; Sumatriptan; Risperidone and related; and Risperidone   Review of Systems Review of Systems  Constitutional: Negative for fever.  HENT: Negative for sore throat.   Eyes:  Negative for redness.  Respiratory: Positive for cough and shortness of breath.   Cardiovascular: Positive for chest pain. Negative for leg swelling.  Gastrointestinal: Negative for abdominal pain, diarrhea and vomiting.  Genitourinary: Negative for flank pain.  Musculoskeletal: Negative for back pain and neck pain.  Skin: Negative for rash.  Neurological: Negative for headaches.  Hematological: Does not bruise/bleed easily.  Psychiatric/Behavioral: Negative for confusion.     Physical Exam Updated Vital Signs BP 132/67    Pulse 64    Resp 17    Ht 1.905 m (6\' 3" )    Wt 129.3 kg    SpO2 95%    BMI 35.63 kg/m   Physical Exam Vitals signs and nursing note reviewed.  Constitutional:      Appearance: Normal appearance. He is well-developed.  HENT:     Head: Atraumatic.     Nose: Nose normal.     Mouth/Throat:  Mouth: Mucous membranes are moist.     Pharynx: Oropharynx is clear.  Eyes:     General: No scleral icterus.    Conjunctiva/sclera: Conjunctivae normal.  Neck:     Musculoskeletal: Normal range of motion and neck supple. No neck rigidity.     Trachea: No tracheal deviation.  Cardiovascular:     Rate and Rhythm: Normal rate and regular rhythm.     Pulses: Normal pulses.     Heart sounds: Normal heart sounds. No murmur. No friction rub. No gallop.   Pulmonary:     Effort: Pulmonary effort is normal. No accessory muscle usage or respiratory distress.     Breath sounds: Wheezing present.  Chest:     Chest wall: Tenderness present.  Abdominal:     General: Bowel sounds are normal. There is no distension.     Palpations: Abdomen is soft.     Tenderness: There is no abdominal tenderness. There is no guarding.  Genitourinary:    Comments: No cva tenderness. Musculoskeletal:        General: No swelling or tenderness.     Right lower leg: No edema.     Left lower leg: No edema.  Skin:    General: Skin is warm and dry.     Findings: No rash.  Neurological:      Mental Status: He is alert.     Comments: Alert, speech clear.   Psychiatric:        Mood and Affect: Mood normal.      ED Treatments / Results  Labs (all labs ordered are listed, but only abnormal results are displayed) Results for orders placed or performed during the hospital encounter of 04/05/19  SARS Coronavirus 2 (CEPHEID- Performed in [place] hospital lab), [facility] Order  Result Value Ref Range   SARS Coronavirus 2 NEGATIVE NEGATIVE  CBC  Result Value Ref Range   WBC 7.5 4.0 - 10.5 K/uL   RBC 5.27 4.22 - 5.81 MIL/uL   Hemoglobin 16.1 13.0 - 17.0 g/dL   HCT 47.0 39.0 - 52.0 %   MCV 89.2 80.0 - 100.0 fL   MCH 30.6 26.0 - 34.0 pg   MCHC 34.3 30.0 - 36.0 g/dL   RDW 13.5 11.5 - 15.5 %   Platelets 215 150 - 400 K/uL   nRBC 0.0 0.0 - 0.2 %  Basic metabolic panel  Result Value Ref Range   Sodium 137 135 - 145 mmol/L   Potassium 4.1 3.5 - 5.1 mmol/L   Chloride 105 98 - 111 mmol/L   CO2 21 (L) 22 - 32 mmol/L   Glucose, Bld 121 (H) 70 - 99 mg/dL   BUN 8 6 - 20 mg/dL   Creatinine, Ser 0.74 0.61 - 1.24 mg/dL   Calcium 9.0 8.9 - 10.3 mg/dL   GFR calc non Af Amer >60 >60 mL/min   GFR calc Af Amer >60 >60 mL/min   Anion gap 11 5 - 15  I-stat troponin, ED  Result Value Ref Range   Troponin i, poc 0.00 0.00 - 0.08 ng/mL   Comment 3           Dg Chest Port 1 View  Result Date: 04/05/2019 CLINICAL DATA:  Cough EXAM: PORTABLE CHEST 1 VIEW COMPARISON:  11/30/2017 FINDINGS: The heart size and mediastinal contours are within normal limits. Both lungs are clear. The visualized skeletal structures are unremarkable. IMPRESSION: No active disease. Electronically Signed   By: Kathreen Devoid   On: 04/05/2019 11:51  EKG None  Radiology Dg Chest Port 1 View  Result Date: 04/05/2019 CLINICAL DATA:  Cough EXAM: PORTABLE CHEST 1 VIEW COMPARISON:  11/30/2017 FINDINGS: The heart size and mediastinal contours are within normal limits. Both lungs are clear. The visualized skeletal  structures are unremarkable. IMPRESSION: No active disease. Electronically Signed   By: Kathreen Devoid   On: 04/05/2019 11:51    Procedures Procedures (including critical care time)  Medications Ordered in ED Medications - No data to display   Initial Impression / Assessment and Plan / ED Course  I have reviewed the triage vital signs and the nursing notes.  Pertinent labs & imaging results that were available during my care of the patient were reviewed by me and considered in my medical decision making (see chart for details).  Iv ns. Labs. Pcxr. Ecg.   Reviewed nursing notes and prior charts for additional history.   Albuterol/atrovent mdi tx.   Labs reviewed by me - after symptoms present/constant x days, trop is normal.  Cxr reviewed by me - no pna.  Post mdi tx, wheezing resolved. Pt feels improved.   covid test negative.   Went to reassess pt again - pt had exited ED without notifying myself/staff - pt left AMA prior to completion ED evaluation.   [name] was evaluated in Emergency Department on 04/05/2019 for the symptoms described in the history of present illness. He was evaluated in the context of the global COVID-19 pandemic, which necessitated consideration that the patient might be at risk for infection with the SARS-CoV-2 virus that causes COVID-19. Institutional protocols and algorithms that pertain to the evaluation of patients at risk for COVID-19 are in a state of rapid change based on information released by regulatory bodies including the CDC and federal and state organizations. These policies and algorithms were followed during the patient's care in the ED.   Final Clinical Impressions(s) / ED Diagnoses   Final diagnoses:  None    ED Discharge Orders    None       Lajean Saver, MD 04/05/19 1257

## 2019-04-05 NOTE — Discharge Instructions (Signed)
Pt left AMA prior to completion evaluation.

## 2019-04-05 NOTE — ED Triage Notes (Signed)
Pt in with c/o cp and sob, has bronchitis - dx 5/22. States more anxious today

## 2019-04-07 NOTE — Telephone Encounter (Signed)
Patient informed. 

## 2019-04-09 ENCOUNTER — Ambulatory Visit (INDEPENDENT_AMBULATORY_CARE_PROVIDER_SITE_OTHER): Payer: Medicare Other | Admitting: Family Medicine

## 2019-04-09 ENCOUNTER — Other Ambulatory Visit: Payer: Self-pay

## 2019-04-09 ENCOUNTER — Ambulatory Visit: Payer: Self-pay | Admitting: *Deleted

## 2019-04-09 ENCOUNTER — Encounter: Payer: Self-pay | Admitting: Family Medicine

## 2019-04-09 DIAGNOSIS — J209 Acute bronchitis, unspecified: Secondary | ICD-10-CM | POA: Diagnosis not present

## 2019-04-09 DIAGNOSIS — J45909 Unspecified asthma, uncomplicated: Secondary | ICD-10-CM | POA: Diagnosis not present

## 2019-04-09 DIAGNOSIS — F4310 Post-traumatic stress disorder, unspecified: Secondary | ICD-10-CM

## 2019-04-09 DIAGNOSIS — F259 Schizoaffective disorder, unspecified: Secondary | ICD-10-CM

## 2019-04-09 MED ORDER — ALPRAZOLAM 1 MG PO TABS: 1.0000 mg | ORAL_TABLET | Freq: Two times a day (BID) | ORAL | 0 refills | Status: DC | PRN

## 2019-04-09 MED ORDER — VENLAFAXINE HCL ER 75 MG PO CP24: 75.0000 mg | ORAL_CAPSULE | Freq: Every day | ORAL | 0 refills | Status: DC

## 2019-04-09 MED ORDER — AZITHROMYCIN 250 MG PO TABS: ORAL_TABLET | ORAL | 0 refills | Status: DC

## 2019-04-09 MED ORDER — BUDESONIDE 180 MCG/ACT IN AEPB: 2.0000 | INHALATION_SPRAY | Freq: Two times a day (BID) | RESPIRATORY_TRACT | 0 refills | Status: DC

## 2019-04-09 MED ORDER — ARIPIPRAZOLE 20 MG PO TABS: 20.0000 mg | ORAL_TABLET | Freq: Two times a day (BID) | ORAL | 0 refills | Status: DC

## 2019-04-09 NOTE — Progress Notes (Signed)
Subjective:    Patient ID: [name], male    DOB: June 11, 1976, 43 y.o.   MRN: [id number]  HPI Virtual Visit via Video Note  I connected with the patient on 04/09/19 at  9:15 AM EDT by a video enabled telemedicine application and verified that I am speaking with the correct person using two identifiers.  Location patient: home Location provider:work or home office Persons participating in the virtual visit: patient, provider  I discussed the limitations of evaluation and management by telemedicine and the availability of in person appointments. The patient expressed understanding and agreed to proceed.   HPI: Here for several issues. First he has been to the ER twice in the past week (on 04-02-19 and 04-05-19) for SOB, wheezing, and a dry cough. No fever. No HA or ST or body aches or NVD. He time he appeared to be in no distress, his VS were good and his O2 sats were normal. His exam was remarkable only for some wheezing. A CXR was clear. Labs were normal including the WBC count. A test for the Covid-19 virus was negative. Each time he was diagnosed with allergies and asthma. At the first visit he was given Prednisone 20 mg daily for 5 days and a Ventolin inhaler, but these did not help. At the second visit he left AMA before any treatment could be offered. The coughing has been present now for a week. He is using the Ventolin about 4 times a day. The other issue is he asks for hellp with his psychiatric medications. He sees the  Mental Health clinic for schizoaffective disorder and PTSD. He normally takes Xanax, Abilify, and Effexor for these issues, and he has done well on them. However he ran out of these medications 3 weeks ago, and he is now feeling extremely anxious and he is unable to sleep. He has attempted to contact the Mental Health clinic numerous times but they have not returned any of his calls.    ROS: See pertinent positives and negatives per HPI.  Past Medical History:   Diagnosis Date  . Anxiety   . Asthma   . Bipolar 1 disorder ([place])   . Chronic back pain   . Complication of anesthesia    Anxiety when he awaken with ET tube still in place  . GERD (gastroesophageal reflux disease)   . Hyperlipidemia   . Hypoventilation syndrome   . Left testicular torsion    "20 years ago"  . Lumbar radiculopathy   . Migraines   . Schizoaffective disorder ([place])    Day Mark    Past Surgical History:  Procedure Laterality Date  . ANKLE SURGERY    . APPENDECTOMY    . BIOPSY  01/15/2018   Procedure: BIOPSY;  Surgeon: Daneil Dolin, MD;  [facility];  Service: Endoscopy;;  gastric  . CHOLECYSTECTOMY    . ESOPHAGOGASTRODUODENOSCOPY  03/17/2008   RMR: A tiny distal esophageal erosions consistent with mild  erosive reflux esophagitis, otherwise normal esophagus, small hiatal hernia, minimally polypoid antral mucosa with doubtful clinical was significance.  Otherwise, normal stomach, patent pylorus, and normal D1-D2  . ESOPHAGOGASTRODUODENOSCOPY (EGD) WITH PROPOFOL N/A 02/18/2013   Procedure: ESOPHAGOGASTRODUODENOSCOPY (EGD) WITH PROPOFOL;  Surgeon: Daneil Dolin, MD;  Location[facility];  Service: Endoscopy;  Laterality: N/A;  . ESOPHAGOGASTRODUODENOSCOPY (EGD) WITH PROPOFOL N/A 01/15/2018   Procedure: ESOPHAGOGASTRODUODENOSCOPY (EGD) WITH PROPOFOL;  Surgeon: Daneil Dolin, MD;  [facility];  Service: Endoscopy;  Laterality:  N/A;  2:15pm  . MALONEY DILATION N/A 01/15/2018   Procedure: Venia Minks DILATION;  Surgeon: Daneil Dolin, MD;  [facility];  Service: Endoscopy;  Laterality: N/A;  . NASAL SINUS SURGERY  01/24/2012   Procedure: ENDOSCOPIC SINUS SURGERY;  Surgeon: Izora Gala, MD;  Location: [facility];  Service: ENT;  Laterality: Left;  left ethmoidectomy, maxillary, frontal  . UMBILICAL HERNIA REPAIR      Family History  Problem Relation Age of Onset  . Anesthesia problems Mother   . Colon cancer Maternal Grandfather   . Crohn's  disease Maternal Grandfather   . Gastric cancer Paternal Uncle   . Esophageal cancer Neg Hx      Current Outpatient Medications:  .  albuterol (VENTOLIN HFA) 108 (90 Base) MCG/ACT inhaler, Inhale 2 puffs into the lungs every 6 (six) hours as needed for wheezing or shortness of breath., Disp: 1 Inhaler, Rfl: 0 .  gabapentin (NEURONTIN) 300 MG capsule, Take 600 mg by mouth 3 (three) times daily., Disp: , Rfl:  .  nicotine (NICODERM CQ - DOSED IN MG/24 HOURS) 21 mg/24hr patch, Place 1 patch (21 mg total) onto the skin daily. (Patient not taking: Reported on 04/05/2019), Disp: 28 patch, Rfl: 0 .  pantoprazole (PROTONIX) 40 MG tablet, Take 1 tablet (40 mg total) by mouth daily., Disp: 90 tablet, Rfl: 1  EXAM:  VITALS per patient if applicable:  GENERAL: alert, oriented, appears well and in no acute distress  HEENT: atraumatic, conjunttiva clear, no obvious abnormalities on inspection of external nose and ears  NECK: normal movements of the head and neck  LUNGS: on inspection no signs of respiratory distress, breathing rate appears normal, no obvious gross SOB, gasping or wheezing  CV: no obvious cyanosis  MS: moves all visible extremities without noticeable abnormality  PSYCH/NEURO: pleasant and cooperative, he seems quite anxious,  speech and thought processing grossly intact  ASSESSMENT AND PLAN: He seems to have a bronchitis, possibly atypical, and this is exacerbating his asthma. We will add a Pulmicort inhaler BID and also give him a Zpack. He will follow up with Dr. Martinique, his PCP. For the psychiatric meds, I agreed to send in a 30 day supply of them to cover until he can get back with the Mental Health clinic.  Alysia Penna, MD  Discussed the following assessment and plan:  No diagnosis found.     I discussed the assessment and treatment plan with the patient. The patient was provided an opportunity to ask questions and all were answered. The patient agreed with the plan and  demonstrated an understanding of the instructions.   The patient was advised to call back or seek an in-person evaluation if the symptoms worsen or if the condition fails to improve as anticipated.     Review of Systems     Objective:   Physical Exam        Assessment & Plan:

## 2019-04-09 NOTE — Telephone Encounter (Signed)
Pt scheduled today with Dr Sarajane Jews

## 2019-04-09 NOTE — Telephone Encounter (Signed)
Asthma, bronchitis- patient is calling to report that he is having a flare with his asthma and bronchitis. Patient reports ongoing symptoms- he has never really gotten well. He has been tested for COVID and was negative. Cough and breathing difficulty- no fever present.   Patient is awaiting referral to pain clinic and patient has not heard anything from them yet about his intake appointment. Patient is having back pain and had developed shoulder pain and neck pain on the R. Patient was checked for COVID when he was last hospitalized- patient is using inhaler at least 4 times a day- sometimes more than that. Reason for Disposition . [1] MILD asthma attack (e.g., no SOB at rest, mild SOB with walking, speaks normally in sentences, mild wheezing) AND [2]  persists > 24 hours on appropriate treatment  Answer Assessment - Initial Assessment Questions 1. RESPIRATORY STATUS: "Describe your breathing?" (e.g., wheezing, shortness of breath, unable to speak, severe coughing)      Patient is breathing fine 2. ONSET: "When did this asthma attack begin?"      5 am this morning 3. TRIGGER: "What do you think triggered this attack?" (e.g., URI, exposure to pollen or other allergen, tobacco smoke)      Patient was sleeping and woke up 4. PEAK EXPIRATORY FLOW RATE (PEFR): "Do you use a peak flow meter?" If so, ask: "What's the current peak flow? What's your personal best peak flow?"      no 5. SEVERITY: "How bad is this attack?"    - MILD: No SOB at rest, mild SOB with walking, speaks normally in sentences, can lay down, no retractions, pulse < 100. (GREEN Zone: PEFR 80-100%)   - MODERATE: SOB at rest, SOB with minimal exertion and prefers to sit, cannot lie down flat, speaks in phrases, mild retractions, audible wheezing, pulse 100-120. (YELLOW Zone: PEFR 50-80%)    - SEVERE: Very SOB at rest, speaks in single words, struggling to breathe, sitting hunched forward, retractions, usually loud wheezing, sometimes  minimal wheezing because of decreased air movement, pulse > 120. (RED Zone: PEFR < 50%).      Recovered from attack 6. MEDICATIONS (Inhaler or nebs): "What are your asthma medications?" and "What treatments have you given so far?"    - Quick-relief: albuterol, metaproterenol, salbutamol, or other inhaled or nebulized beta-agonist medicines   - Long-term-control: steroids, cromolyn, or other anti-inflammatory medicines.     Inhaler- albuterol 7. OTHER SYMPTOMS: "Do you have any other symptoms? (e.g., runny nose, chest pain, fever)     Cough, SOB at times 8. PREGNANCY: "Is there any chance you are pregnant?" "When was your last menstrual period?"     n/a  Protocols used: ASTHMA ATTACK-A-AH

## 2019-04-12 ENCOUNTER — Ambulatory Visit (INDEPENDENT_AMBULATORY_CARE_PROVIDER_SITE_OTHER): Payer: Medicare Other | Admitting: Family Medicine

## 2019-04-12 ENCOUNTER — Encounter: Payer: Self-pay | Admitting: Family Medicine

## 2019-04-12 ENCOUNTER — Other Ambulatory Visit: Payer: Self-pay

## 2019-04-12 DIAGNOSIS — G8929 Other chronic pain: Secondary | ICD-10-CM | POA: Diagnosis not present

## 2019-04-12 DIAGNOSIS — M5441 Lumbago with sciatica, right side: Secondary | ICD-10-CM | POA: Diagnosis not present

## 2019-04-12 NOTE — Progress Notes (Signed)
Virtual Visit via Video Note   I connected with [name] on 04/12/19 at 11:15 AM EDT by a video enabled telemedicine application and verified that I am speaking with the correct person using two identifiers.  Location patient: home Location provider:work Persons participating in the virtual visit: patient, provider  I discussed the limitations of evaluation and management by telemedicine and the availability of in person appointments. The patient expressed understanding and agreed to proceed.   HPI: Charles. [name] is a 43 year old male with history of bipolar disorder, anxiety, tobacco use, and chronic pain among some; who is complaining about lower back pain, states that he cannot get up from bed. He states that this is his typical pain.  I last saw him for establishing care on 03/29/2019. Since his last visit he has been in the ER a couple times and also seen here in the clinic, treated with antibiotics for respiratory infection and given a prescription for alprazolam.  Requesting medication for pain.  Last visit I gave him a week tramadol prescription, he understood I would not be managing his chronic pain.  Referral for pain clinic was placed, pending appointment. Pain is severe, constant, exacerbated by movement and alleviated by rest. Negative for recent trauma or unusual physical activity.  He is allergic to Toradol. Ibuprofen and naproxen do not help. He has follow-up with orthopedist, refused surgical treatment. He is also on gabapentin 600 mg 3 times daily, which he does not feel it helps with the pain. Negative for saddle anesthesia or bowel/urine incontinence. He has not noted fever, chills, or rash on affected area.   ROS: See pertinent positives and negatives per HPI.  Past Medical History:  Diagnosis Date  . Anxiety   . Asthma   . Bipolar 1 disorder ([place])   . Chronic back pain   . Complication of anesthesia    Anxiety when he awaken with ET tube still in place  . GERD  (gastroesophageal reflux disease)   . Hyperlipidemia   . Hypoventilation syndrome   . Left testicular torsion    "20 years ago"  . Lumbar radiculopathy   . Migraines   . Schizoaffective disorder ([place])    Day Mark    Past Surgical History:  Procedure Laterality Date  . ANKLE SURGERY    . APPENDECTOMY    . BIOPSY  01/15/2018   Procedure: BIOPSY;  Surgeon: Daneil Dolin, MD;  [facility];  Service: Endoscopy;;  gastric  . CHOLECYSTECTOMY    . ESOPHAGOGASTRODUODENOSCOPY  03/17/2008   RMR: A tiny distal esophageal erosions consistent with mild  erosive reflux esophagitis, otherwise normal esophagus, small hiatal hernia, minimally polypoid antral mucosa with doubtful clinical was significance.  Otherwise, normal stomach, patent pylorus, and normal D1-D2  . ESOPHAGOGASTRODUODENOSCOPY (EGD) WITH PROPOFOL N/A 02/18/2013   Procedure: ESOPHAGOGASTRODUODENOSCOPY (EGD) WITH PROPOFOL;  Surgeon: Daneil Dolin, MD;  Location: [facility];  Service: Endoscopy;  Laterality: N/A;  . ESOPHAGOGASTRODUODENOSCOPY (EGD) WITH PROPOFOL N/A 01/15/2018   Procedure: ESOPHAGOGASTRODUODENOSCOPY (EGD) WITH PROPOFOL;  Surgeon: Daneil Dolin, MD;  [facility];  Service: Endoscopy;  Laterality: N/A;  2:15pm  . MALONEY DILATION N/A 01/15/2018   Procedure: Venia Minks DILATION;  Surgeon: Daneil Dolin, MD;  [facility];  Service: Endoscopy;  Laterality: N/A;  . NASAL SINUS SURGERY  01/24/2012   Procedure: ENDOSCOPIC SINUS SURGERY;  Surgeon: Izora Gala, MD;  Location: [place];  Service: ENT;  Laterality: Left;  left ethmoidectomy, maxillary, frontal  .  UMBILICAL HERNIA REPAIR      Family History  Problem Relation Age of Onset  . Anesthesia problems Mother   . Colon cancer Maternal Grandfather   . Crohn's disease Maternal Grandfather   . Gastric cancer Paternal Uncle   . Esophageal cancer Neg Hx     Social History   Socioeconomic History  . Marital status: Divorced    Spouse name: Not  on file  . Number of children: Not on file  . Years of education: Not on file  . Highest education level: Not on file  Occupational History  . Not on file  Social Needs  . Financial resource strain: Not on file  . Food insecurity:    Worry: Not on file    Inability: Not on file  . Transportation needs:    Medical: Not on file    Non-medical: Not on file  Tobacco Use  . Smoking status: Current Every Day Smoker    Packs/day: 1.00    Years: 22.00    Pack years: 22.00    Types: Cigarettes  . Smokeless tobacco: Never Used  Substance and Sexual Activity  . Alcohol use: Yes    Comment: occ  . Drug use: No  . Sexual activity: Not on file  Lifestyle  . Physical activity:    Days per week: Not on file    Minutes per session: Not on file  . Stress: Not on file  Relationships  . Social connections:    Talks on phone: Not on file    Gets together: Not on file    Attends religious service: Not on file    Active member of club or organization: Not on file    Attends meetings of clubs or organizations: Not on file    Relationship status: Not on file  . Intimate partner violence:    Fear of current or ex partner: Not on file    Emotionally abused: Not on file    Physically abused: Not on file    Forced sexual activity: Not on file  Other Topics Concern  . Not on file  Social History Narrative  . Not on file     Current Outpatient Medications:  .  albuterol (VENTOLIN HFA) 108 (90 Base) MCG/ACT inhaler, Inhale 2 puffs into the lungs every 6 (six) hours as needed for wheezing or shortness of breath., Disp: 1 Inhaler, Rfl: 0 .  ALPRAZolam (XANAX) 1 MG tablet, Take 1 tablet (1 mg total) by mouth 2 (two) times daily as needed for anxiety., Disp: 60 tablet, Rfl: 0 .  ARIPiprazole (ABILIFY) 20 MG tablet, Take 1 tablet (20 mg total) by mouth 2 (two) times daily., Disp: 60 tablet, Rfl: 0 .  azithromycin (ZITHROMAX) 250 MG tablet, As directed, Disp: 6 tablet, Rfl: 0 .  budesonide  (PULMICORT FLEXHALER) 180 MCG/ACT inhaler, Inhale 2 puffs into the lungs 2 (two) times daily., Disp: 1 Inhaler, Rfl: 0 .  gabapentin (NEURONTIN) 300 MG capsule, Take 600 mg by mouth 3 (three) times daily., Disp: , Rfl:  .  nicotine (NICODERM CQ - DOSED IN MG/24 HOURS) 21 mg/24hr patch, Place 1 patch (21 mg total) onto the skin daily. (Patient not taking: Reported on 04/05/2019), Disp: 28 patch, Rfl: 0 .  pantoprazole (PROTONIX) 40 MG tablet, Take 1 tablet (40 mg total) by mouth daily., Disp: 90 tablet, Rfl: 1 .  venlafaxine XR (EFFEXOR XR) 75 MG 24 hr capsule, Take 1 capsule (75 mg total) by mouth daily., Disp: 30 capsule,  Rfl: 0  EXAM:  VITALS per patient if applicable:N/A  GENERAL: alert, oriented, appears well and in no acute distress  HEENT: atraumatic, normocephalic, conjunttiva clear.  NECK: normal movements of the head and neck  LUNGS: on inspection no signs of respiratory distress, breathing rate appears normal, no obvious gross SOB, gasping or wheezing  MS: moves all visible extremities without noticeable abnormality.  He does not seem to be in pain.  PSYCH/NEURO: He is upset but cooperative, no obvious depression,he is anxious.  ASSESSMENT AND PLAN:  Discussed the following assessment and plan:  Chronic low back pain with right-sided sciatica, unspecified back pain laterality According to patient, this is his typical pain; so I do not think imaging is needed at this time. Pending appointment with pain management.He reached for paper to write down information about pain clinic, phone number, he did not seem to have difficulty doing so. [place] controlled substance report website was reviewed, he has received prescription for opioids from different providers (see scanned copy).  Explained that I do not feel comfortable prescribing opioids for chronic pain management, which we also discussed last visit. Upset because I am not prescribing medication at this time. Recommend relative  rest, move as tolerated.  He finished virtual visit.   I discussed the assessment and treatment plan with the patient. He was provided an opportunity to ask questions and all were answered. The patient agreed with the plan and demonstrated an understanding of the instructions.   The patient was advised to call back or seek an in-person evaluation if the symptoms worsen or if the condition fails to improve as anticipated.  Return if symptoms worsen or fail to improve.    Betty Martinique, MD

## 2019-04-15 ENCOUNTER — Other Ambulatory Visit: Payer: Self-pay

## 2019-04-15 ENCOUNTER — Encounter: Payer: Self-pay | Admitting: Family Medicine

## 2019-04-15 ENCOUNTER — Ambulatory Visit: Payer: Medicare Other | Admitting: Family Medicine

## 2019-04-15 DIAGNOSIS — M542 Cervicalgia: Secondary | ICD-10-CM

## 2019-04-15 NOTE — Progress Notes (Signed)
Needs in office visit and plans to schedule for tomorrow.

## 2019-04-16 ENCOUNTER — Encounter: Payer: Self-pay | Admitting: Family Medicine

## 2019-04-16 ENCOUNTER — Other Ambulatory Visit: Payer: Self-pay

## 2019-04-16 ENCOUNTER — Ambulatory Visit (INDEPENDENT_AMBULATORY_CARE_PROVIDER_SITE_OTHER): Payer: Medicare Other | Admitting: Family Medicine

## 2019-04-16 VITALS — BP 120/70 | HR 96 | Temp 98.6°F | Resp 12 | Ht 75.0 in | Wt 282.4 lb

## 2019-04-16 DIAGNOSIS — R202 Paresthesia of skin: Secondary | ICD-10-CM

## 2019-04-16 DIAGNOSIS — M549 Dorsalgia, unspecified: Secondary | ICD-10-CM

## 2019-04-16 MED ORDER — GABAPENTIN 300 MG PO CAPS: 300.0000 mg | ORAL_CAPSULE | Freq: Three times a day (TID) | ORAL | 0 refills | Status: AC

## 2019-04-16 MED ORDER — PREDNISONE 20 MG PO TABS: ORAL_TABLET | ORAL | 0 refills | Status: AC

## 2019-04-16 MED ORDER — BACLOFEN 10 MG PO TABS: 10.0000 mg | ORAL_TABLET | Freq: Three times a day (TID) | ORAL | 0 refills | Status: DC

## 2019-04-16 NOTE — Progress Notes (Signed)
ACUTE VISIT   HPI:  Chief Complaint  Patient presents with  . Follow-up    right shoulder andd neck pain    [name] is a 43 y.o. male, who is here today complaining of a week off right upper back severe pain radiated to right shoulder and right upper extremity. Associated with some tingling and "weakness" of right upper extremity. He is reporting this problem as new. He denies any unusual activity or recent trauma.  Negative for erythema or erythema on affected area. + Limitation of movement right shoulder and neck. Pain is constant, exacerbated by movement. No alleviating factors identified.  He has not taking OTC medications.  He is also requesting a refill on gabapentin, he takes 300 mg 4 times per Charles.  Last visit he reported that medication was not helping, states that "I have to take something" for pain, med helps "a little bit.". Chronic pain disorder,pending appointment with pain management.   Review of Systems  Constitutional: Negative for appetite change, chills and fever.  HENT: Negative for mouth sores and sore throat.   Musculoskeletal: Negative for gait problem and joint swelling.  Psychiatric/Behavioral: Negative for confusion. The patient is nervous/anxious.   Rest ROS see pertinent positives and negatives in HPI.    Current Outpatient Medications on File Prior to Visit  Medication Sig Dispense Refill  . albuterol (VENTOLIN HFA) 108 (90 Base) MCG/ACT inhaler Inhale 2 puffs into the lungs every 6 (six) hours as needed for wheezing or shortness of breath. 1 Inhaler 0  . ALPRAZolam (XANAX) 1 MG tablet Take 1 tablet (1 mg total) by mouth 2 (two) times daily as needed for anxiety. 60 tablet 0  . ARIPiprazole (ABILIFY) 20 MG tablet Take 1 tablet (20 mg total) by mouth 2 (two) times daily. 60 tablet 0  . budesonide (PULMICORT FLEXHALER) 180 MCG/ACT inhaler Inhale 2 puffs into the lungs 2 (two) times daily. 1 Inhaler 0  . pantoprazole  (PROTONIX) 40 MG tablet Take 1 tablet (40 mg total) by mouth daily. 90 tablet 1  . ramelteon (ROZEREM) 8 MG tablet     . venlafaxine XR (EFFEXOR XR) 75 MG 24 hr capsule Take 1 capsule (75 mg total) by mouth daily. 30 capsule 0   No current facility-administered medications on file prior to visit.      Past Medical History:  Diagnosis Date  . Anxiety   . Asthma   . Bipolar 1 disorder ([name])   . Chronic back pain   . Complication of anesthesia    Anxiety when he awaken with ET tube still in place  . GERD (gastroesophageal reflux disease)   . Hyperlipidemia   . Hypoventilation syndrome   . Left testicular torsion    "20 years ago"  . Lumbar radiculopathy   . Migraines   . Schizoaffective disorder ([facility])    [name]   Allergies  Allergen Reactions  . Compazine Shortness Of Breath    Had to be intubated  . Imitrex [Sumatriptan Base] Swelling    Facial swelling  . Ketorolac Tromethamine Anaphylaxis  . Prochlorperazine Anaphylaxis  . Risperidone Other (See Comments)  . Sumatriptan Other (See Comments)  . Risperidone And Related Other (See Comments)    Shaking and insomnia  . Risperidone Rash    Social History   Socioeconomic History  . Marital status: Divorced    Spouse name: Not on file  . Number of children: Not on file  . Years of education:  Not on file  . Highest education level: Not on file  Occupational History  . Not on file  Social Needs  . Financial resource strain: Not on file  . Food insecurity:    Worry: Not on file    Inability: Not on file  . Transportation needs:    Medical: Not on file    Non-medical: Not on file  Tobacco Use  . Smoking status: Current Every Charles Smoker    Packs/Charles: 1.00    Years: 22.00    Pack years: 22.00    Types: Cigarettes  . Smokeless tobacco: Never Used  Substance and Sexual Activity  . Alcohol use: Yes    Comment: occ  . Drug use: No  . Sexual activity: Not on file  Lifestyle  . Physical activity:    Days per  week: Not on file    Minutes per session: Not on file  . Stress: Not on file  Relationships  . Social connections:    Talks on phone: Not on file    Gets together: Not on file    Attends religious service: Not on file    Active member of club or organization: Not on file    Attends meetings of clubs or organizations: Not on file    Relationship status: Not on file  Other Topics Concern  . Not on file  Social History Narrative  . Not on file    Vitals:   04/16/19 1026  BP: 120/70  Pulse: 96  Resp: 12  Temp: 98.6 F (37 C)  SpO2: 96%   Body mass index is 35.29 kg/m.   Physical Exam  Nursing note and vitals reviewed. Constitutional: He is oriented to person, place, and time. He appears well-developed. He does not appear ill. No distress.  HENT:  Head: Normocephalic and atraumatic.  Edentulous.  Eyes: Conjunctivae are normal.  Cardiovascular: Normal rate and regular rhythm.  No murmur heard. Respiratory: Effort normal. No respiratory distress.  Musculoskeletal:        General: No edema.     Right shoulder: He exhibits decreased range of motion and tenderness. He exhibits no swelling.     Cervical back: He exhibits decreased range of motion and tenderness. He exhibits no edema and no deformity.     Thoracic back: He exhibits tenderness.     Comments: Pain elicited with barely palpating cervical and thoracic paraspinal muscles, shoulder.  Neurological: He is alert and oriented to person, place, and time. He has normal strength.  Skin: Skin is warm. No rash noted. No erythema.  Psychiatric: His mood appears anxious.  Well groomed,good eye contact.    ASSESSMENT AND PLAN:  [name] was seen today for follow-up.  Diagnoses and all orders for this visit:  Upper back pain on right side Reporting problem as new. Because no hx of trauma for now I do not think imaging is needed. He has taken Baclofen before and would like to resume medication. Baclofen 10 mg  tid,he can take 20 mg pm.Side effects discussed. Local ice and OTC Voltaren and soloma patches may also help. PT can be considered if pain is persistent.  -     baclofen (LIORESAL) 10 MG tablet; Take 1 tablet (10 mg total) by mouth 3 (three) times daily.  Tingling of right upper extremity ? Radiculopathy. After discussion of some side effects,he agrees with trying Prednisone taper. Instructed to take medication with breakfast. Instructed about warning signs. F/U as needed.  -  predniSONE (DELTASONE) 20 MG tablet; 3 tabs for 3 days, 2 tabs for 3 days, 1 tabs for 3 days, and 1/2 tab for 3 days. Take tables together with breakfast. -     gabapentin (NEURONTIN) 300 MG capsule; Take 1 capsule (300 mg total) by mouth 3 (three) times daily.    Return in about 6 months (around 10/16/2019) for cpe.   -[name] was advised to seek immediate medical attention if sudden worsening symptoms or to follow if they persist or if new concerns arise.      Purva Vessell G. Martinique, MD  [facility]. [place] office.

## 2019-04-16 NOTE — Patient Instructions (Signed)
A few things to remember from today's visit:   Tingling of right upper extremity - Plan: baclofen (LIORESAL) 10 MG tablet, predniSONE (DELTASONE) 20 MG tablet, gabapentin (NEURONTIN) 300 MG capsule  Upper back pain on right side  Local ice. Over the counter voltaren may also help. Soloma patches.  Please be sure medication list is accurate. If a new problem present, please set up appointment sooner than planned today.

## 2019-04-19 ENCOUNTER — Other Ambulatory Visit: Payer: Self-pay | Admitting: Orthopedic Surgery

## 2019-04-19 DIAGNOSIS — M25511 Pain in right shoulder: Secondary | ICD-10-CM

## 2019-04-24 ENCOUNTER — Other Ambulatory Visit: Payer: Self-pay | Admitting: Family Medicine

## 2019-04-27 ENCOUNTER — Encounter: Payer: Self-pay | Admitting: Adult Health

## 2019-04-27 ENCOUNTER — Ambulatory Visit (INDEPENDENT_AMBULATORY_CARE_PROVIDER_SITE_OTHER): Payer: Medicare Other | Admitting: Adult Health

## 2019-04-27 ENCOUNTER — Telehealth: Payer: Self-pay | Admitting: Family Medicine

## 2019-04-27 ENCOUNTER — Other Ambulatory Visit: Payer: Self-pay

## 2019-04-27 DIAGNOSIS — J45909 Unspecified asthma, uncomplicated: Secondary | ICD-10-CM | POA: Diagnosis not present

## 2019-04-27 DIAGNOSIS — J209 Acute bronchitis, unspecified: Secondary | ICD-10-CM

## 2019-04-27 NOTE — Telephone Encounter (Signed)
Pt called to set up appt regarding back pain and shoulder pain he is experiencing. I was advised to send over a phone message by front desk staff. Please advise.

## 2019-04-27 NOTE — Progress Notes (Signed)
Virtual Visit via Video Note  I connected with [name] on 04/27/19 at  3:00 PM EDT by a video enabled telemedicine application and verified that I am speaking with the correct person using two identifiers.  Location patient: home Location provider:work or home office Persons participating in the virtual visit: patient, provider  I discussed the limitations of evaluation and management by telemedicine and the availability of in person appointments. The patient expressed understanding and agreed to proceed.   HPI: 43 year old male who is being evaluated today via a video visit for the complaint of shortness of breath.  Patient states "I just need a nebulizer sheen my inhaler is not working."  Things have been present for approximately 1 month.  He reports wheezing, a dry cough with periods of shortness of breath.  He was seen by Dr. Sarajane Jews on 04/08/2018 for the same issue and prescribed a Pulmicort inhaler as well as a Z-Pak. Prior to that he had seen in urgent care and prescribed albuterol inhaler as well as a prednisone Dosepak: Then 3 days later was seen in the emergency room where chest x-ray was negative but he left AMA.  Continues to smoke half a pack to a pack of cigarettes a day  Additionally, states" I was referred to pain management by Dr. Martinique and I called them yesterday and found out that they use my referral on June 4th.  Why was and I contacted about this? And I need you to do something for my pain"    ROS: See pertinent positives and negatives per HPI.  Past Medical History:  Diagnosis Date  . Anxiety   . Asthma   . Bipolar 1 disorder ([place])   . Chronic back pain   . Complication of anesthesia    Anxiety when he awaken with ET tube still in place  . GERD (gastroesophageal reflux disease)   . Hyperlipidemia   . Hypoventilation syndrome   . Left testicular torsion    "20 years ago"  . Lumbar radiculopathy   . Migraines   . Schizoaffective disorder ([place])    Day Mark     Past Surgical History:  Procedure Laterality Date  . ANKLE SURGERY    . APPENDECTOMY    . BIOPSY  01/15/2018   Procedure: BIOPSY;  Surgeon: Daneil Dolin, MD;  [facility];  Service: Endoscopy;;  gastric  . CHOLECYSTECTOMY    . ESOPHAGOGASTRODUODENOSCOPY  03/17/2008   RMR: A tiny distal esophageal erosions consistent with mild  erosive reflux esophagitis, otherwise normal esophagus, small hiatal hernia, minimally polypoid antral mucosa with doubtful clinical was significance.  Otherwise, normal stomach, patent pylorus, and normal D1-D2  . ESOPHAGOGASTRODUODENOSCOPY (EGD) WITH PROPOFOL N/A 02/18/2013   Procedure: ESOPHAGOGASTRODUODENOSCOPY (EGD) WITH PROPOFOL;  Surgeon: Daneil Dolin, MD;  [facility];  Service: Endoscopy;  Laterality: N/A;  . ESOPHAGOGASTRODUODENOSCOPY (EGD) WITH PROPOFOL N/A 01/15/2018   Procedure: ESOPHAGOGASTRODUODENOSCOPY (EGD) WITH PROPOFOL;  Surgeon: Daneil Dolin, MD;  [facility];  Service: Endoscopy;  Laterality: N/A;  2:15pm  . MALONEY DILATION N/A 01/15/2018   Procedure: Venia Minks DILATION;  Surgeon: Daneil Dolin, MD;  [facility];  Service: Endoscopy;  Laterality: N/A;  . NASAL SINUS SURGERY  01/24/2012   Procedure: ENDOSCOPIC SINUS SURGERY;  Surgeon: Izora Gala, MD;  Location: [facility] OR;  Service: ENT;  Laterality: Left;  left ethmoidectomy, maxillary, frontal  . UMBILICAL HERNIA REPAIR      Family History  Problem Relation Age of  Onset  . Anesthesia problems Mother   . Colon cancer Maternal Grandfather   . Crohn's disease Maternal Grandfather   . Gastric cancer Paternal Uncle   . Esophageal cancer Neg Hx       Current Outpatient Medications:  .  albuterol (VENTOLIN HFA) 108 (90 Base) MCG/ACT inhaler, INHALE 2 PUFFS INTO THE LUNGS EVERY 6 HOURS AS NEEDED FOR WHEEZING OR SHORTNESS OF BREATH, Disp: 8.5 g, Rfl: 0 .  ALPRAZolam (XANAX) 1 MG tablet, Take 1 tablet (1 mg total) by mouth 2 (two) times daily as needed for  anxiety., Disp: 60 tablet, Rfl: 0 .  ARIPiprazole (ABILIFY) 20 MG tablet, Take 1 tablet (20 mg total) by mouth 2 (two) times daily., Disp: 60 tablet, Rfl: 0 .  baclofen (LIORESAL) 10 MG tablet, Take 1 tablet (10 mg total) by mouth 3 (three) times daily., Disp: 90 each, Rfl: 0 .  budesonide (PULMICORT FLEXHALER) 180 MCG/ACT inhaler, Inhale 2 puffs into the lungs 2 (two) times daily., Disp: 1 Inhaler, Rfl: 0 .  gabapentin (NEURONTIN) 300 MG capsule, Take 1 capsule (300 mg total) by mouth 3 (three) times daily., Disp: 90 capsule, Rfl: 0 .  pantoprazole (PROTONIX) 40 MG tablet, Take 1 tablet (40 mg total) by mouth daily., Disp: 90 tablet, Rfl: 1 .  ramelteon (ROZEREM) 8 MG tablet, , Disp: , Rfl:  .  venlafaxine XR (EFFEXOR XR) 75 MG 24 hr capsule, Take 1 capsule (75 mg total) by mouth daily., Disp: 30 capsule, Rfl: 0  EXAM:  VITALS per patient if applicable:  GENERAL: alert, oriented, appears well and in no acute distress  HEENT: atraumatic, conjunttiva clear, no obvious abnormalities on inspection of external nose and ears  NECK: normal movements of the head and neck  LUNGS: on inspection no signs of respiratory distress, breathing rate appears normal, no obvious gross SOB, gasping or wheezing  CV: no obvious cyanosis  MS: moves all visible extremities without noticeable abnormality  PSYCH/NEURO: pleasant and cooperative, no obvious depression or anxiety, speech and thought processing grossly intact  ASSESSMENT AND PLAN:  Discussed the following assessment and plan:  -He was advised that I cannot do anything for his pain management and that he would need to be referred elsewhere by his primary care provider.  When discussing treatment options for possible asthma exacerbation he stated "never mind man I am this going to get an appointment with Dr. Martinique tomorrow".    I discussed the assessment and treatment plan with the patient. The patient was provided an opportunity to ask questions  and all were answered. The patient agreed with the plan and demonstrated an understanding of the instructions.   The patient was advised to call back or seek an in-person evaluation if the symptoms worsen or if the condition fails to improve as anticipated.   Dorothyann Peng, NP

## 2019-04-28 NOTE — Telephone Encounter (Signed)
Pt wants to be seen in-office. Please advise. Thanks!

## 2019-04-28 NOTE — Telephone Encounter (Signed)
Because of tobacco use I am suspecting he has COPD,chronic bronchitis.We certainly can meet to discuss this problem. In regard to pain management , a list of pain clinics in the area can be provided ,so he can call offices and trying to establish with provider for chronic pain management. We tried to arrange appt but referral was not accepted.  If he wants to "find another doctor", it is his right to find a provider who meets his expectations, we have a few accepting new pts.  Thanks, BJ

## 2019-04-28 NOTE — Telephone Encounter (Signed)
Please advise. Should patient be seen in office? Patient had a virtual visit with [facility] yesterday, please see notes.

## 2019-04-28 NOTE — Telephone Encounter (Signed)
Patient called in stating he is in need of an in office visit in regards to his bronchitis, back and shoulder pain. Spoke with Mendel Ryder, who advised to send message to get approval. Advised him someone will reach out to him when we have approval for in office visit. Patient states he needs it done ASAP and will not wait. States he will find another MD if necessary. Please advise.

## 2019-04-29 ENCOUNTER — Ambulatory Visit (INDEPENDENT_AMBULATORY_CARE_PROVIDER_SITE_OTHER): Payer: Medicare Other | Admitting: Family Medicine

## 2019-04-29 ENCOUNTER — Telehealth: Payer: Self-pay

## 2019-04-29 ENCOUNTER — Other Ambulatory Visit: Payer: Self-pay

## 2019-04-29 ENCOUNTER — Encounter: Payer: Self-pay | Admitting: Family Medicine

## 2019-04-29 DIAGNOSIS — J4541 Moderate persistent asthma with (acute) exacerbation: Secondary | ICD-10-CM | POA: Diagnosis not present

## 2019-04-29 DIAGNOSIS — G894 Chronic pain syndrome: Secondary | ICD-10-CM

## 2019-04-29 DIAGNOSIS — J45909 Unspecified asthma, uncomplicated: Secondary | ICD-10-CM | POA: Insufficient documentation

## 2019-04-29 MED ORDER — ALBUTEROL SULFATE (2.5 MG/3ML) 0.083% IN NEBU: 2.5000 mg | INHALATION_SOLUTION | RESPIRATORY_TRACT | 0 refills | Status: AC | PRN

## 2019-04-29 NOTE — Telephone Encounter (Signed)
Pt already scheduled for virtual visit with Dr. Sarajane Jews today.

## 2019-04-29 NOTE — Progress Notes (Signed)
Subjective:    Patient ID: [name], male    DOB: 12-05-1975, 43 y.o.   MRN: [id number]  HPI Virtual Visit via Video Note  I connected with the patient on 04/29/19 at 10:30 AM EDT by a video enabled telemedicine application and verified that I am speaking with the correct person using two identifiers.  Location patient: home Location provider:work or home office Persons participating in the virtual visit: patient, provider  I discussed the limitations of evaluation and management by telemedicine and the availability of in person appointments. The patient expressed understanding and agreed to proceed.   HPI: Here for continuing SOB with a non-productive cough and wheezing. This has been an issue for the past 3 weeks. No chest pain or fever. We had a virtual visit with him on 04-09-19 and we gave him a Zpack and started him on Pulmicort BID. He says these have not helped him at all. He has also been given steroids in the past few weeks, but these did not help him either. He has tested negative for the Covid-19 virus recently. He has used a nebulizer in the past, and he thinks he needs one now. Also he has chronic pain in the back and shoulders, and he spoke to Dr. Martinique, his PCP, about this. She referred him to the [facility] pain management clinic, but they refused to see him. He now asks that he be referred somewhere else. He asks me again today to provide him with a short supply of pain medications.    ROS: See pertinent positives and negatives per HPI.  Past Medical History:  Diagnosis Date  . Anxiety   . Asthma   . Bipolar 1 disorder ([place])   . Chronic back pain   . Complication of anesthesia    Anxiety when he awaken with ET tube still in place  . GERD (gastroesophageal reflux disease)   . Hyperlipidemia   . Hypoventilation syndrome   . Left testicular torsion    "20 years ago"  . Lumbar radiculopathy   . Migraines   . Schizoaffective disorder ([place])    Day Mark     Past Surgical History:  Procedure Laterality Date  . ANKLE SURGERY    . APPENDECTOMY    . BIOPSY  01/15/2018   Procedure: BIOPSY;  Surgeon: Daneil Dolin, MD;  [facility];  Service: Endoscopy;;  gastric  . CHOLECYSTECTOMY    . ESOPHAGOGASTRODUODENOSCOPY  03/17/2008   RMR: A tiny distal esophageal erosions consistent with mild  erosive reflux esophagitis, otherwise normal esophagus, small hiatal hernia, minimally polypoid antral mucosa with doubtful clinical was significance.  Otherwise, normal stomach, patent pylorus, and normal D1-D2  . ESOPHAGOGASTRODUODENOSCOPY (EGD) WITH PROPOFOL N/A 02/18/2013   Procedure: ESOPHAGOGASTRODUODENOSCOPY (EGD) WITH PROPOFOL;  Surgeon: Daneil Dolin, MD;  Location: [facility];  Service: Endoscopy;  Laterality: N/A;  . ESOPHAGOGASTRODUODENOSCOPY (EGD) WITH PROPOFOL N/A 01/15/2018   Procedure: ESOPHAGOGASTRODUODENOSCOPY (EGD) WITH PROPOFOL;  Surgeon: Daneil Dolin, MD;  [facility];  Service: Endoscopy;  Laterality: N/A;  2:15pm  . MALONEY DILATION N/A 01/15/2018   Procedure: Venia Minks DILATION;  Surgeon: Daneil Dolin, MD;  [facility];  Service: Endoscopy;  Laterality: N/A;  . NASAL SINUS SURGERY  01/24/2012   Procedure: ENDOSCOPIC SINUS SURGERY;  Surgeon: Izora Gala, MD;  Location: [place];  Service: ENT;  Laterality: Left;  left ethmoidectomy, maxillary, frontal  . UMBILICAL HERNIA REPAIR      Family History  Problem Relation Age of Onset  . Anesthesia problems Mother   . Colon cancer Maternal Grandfather   . Crohn's disease Maternal Grandfather   . Gastric cancer Paternal Uncle   . Esophageal cancer Neg Hx      Current Outpatient Medications:  .  albuterol (VENTOLIN HFA) 108 (90 Base) MCG/ACT inhaler, INHALE 2 PUFFS INTO THE LUNGS EVERY 6 HOURS AS NEEDED FOR WHEEZING OR SHORTNESS OF BREATH, Disp: 8.5 g, Rfl: 0 .  ALPRAZolam (XANAX) 1 MG tablet, Take 1 tablet (1 mg total) by mouth 2 (two) times daily as needed for  anxiety., Disp: 60 tablet, Rfl: 0 .  ARIPiprazole (ABILIFY) 20 MG tablet, Take 1 tablet (20 mg total) by mouth 2 (two) times daily., Disp: 60 tablet, Rfl: 0 .  baclofen (LIORESAL) 10 MG tablet, Take 1 tablet (10 mg total) by mouth 3 (three) times daily., Disp: 90 each, Rfl: 0 .  budesonide (PULMICORT FLEXHALER) 180 MCG/ACT inhaler, Inhale 2 puffs into the lungs 2 (two) times daily., Disp: 1 Inhaler, Rfl: 0 .  gabapentin (NEURONTIN) 300 MG capsule, Take 1 capsule (300 mg total) by mouth 3 (three) times daily., Disp: 90 capsule, Rfl: 0 .  pantoprazole (PROTONIX) 40 MG tablet, Take 1 tablet (40 mg total) by mouth daily., Disp: 90 tablet, Rfl: 1 .  ramelteon (ROZEREM) 8 MG tablet, , Disp: , Rfl:  .  venlafaxine XR (EFFEXOR XR) 75 MG 24 hr capsule, Take 1 capsule (75 mg total) by mouth daily., Disp: 30 capsule, Rfl: 0  EXAM:  VITALS per patient if applicable:  GENERAL: alert, oriented, appears well and in no acute distress  HEENT: atraumatic, conjunttiva clear, no obvious abnormalities on inspection of external nose and ears  NECK: normal movements of the head and neck  LUNGS: on inspection no signs of respiratory distress, breathing rate appears normal, no obvious gross SOB, gasping or wheezing  CV: no obvious cyanosis  MS: moves all visible extremities without noticeable abnormality  PSYCH/NEURO: pleasant and cooperative, no obvious depression or anxiety, speech and thought processing grossly intact  ASSESSMENT AND PLAN: Asthma, I agreed to send in a prescription to his pharmacy to rpovide him with a nebulizer to use albuterol at home as needed. His PCP, Dr. Martinique, is the one who needs to refer him to another pain clinic, and I will relay this message to her. I declined to give him any pain medications today.  Alysia Penna, MD  Discussed the following assessment and plan:  Moderate persistent asthma with acute exacerbation  Chronic pain syndrome     I discussed the assessment and  treatment plan with the patient. The patient was provided an opportunity to ask questions and all were answered. The patient agreed with the plan and demonstrated an understanding of the instructions.   The patient was advised to call back or seek an in-person evaluation if the symptoms worsen or if the condition fails to improve as anticipated.     Review of Systems     Objective:   Physical Exam        Assessment & Plan:

## 2019-04-29 NOTE — Telephone Encounter (Signed)
Pt called in to [facility] and was very upset. He states he was told he would be getting a nebulizer and pain meds today. He states the nebulizer order was sent to [facility] and they don't have the machines. He wants to know why we "can't get anything right". I advised him usually neb machines are sent to DME companies and this may be the case for him, however this usually takes times as they company has to review the request and his insurance requirements before dispensing the machine. He also wants to know he was told Dr. Martinique was not in the office when she really is and states we "just told him that because she didn't have room to see him" - I advised the pt Dr. Martinique is off every Thursday and this is nothing new for her schedule. He states Dr. Sarajane Jews told him she was here today so we are lying. I advised him Dr. Sarajane Jews likely had his days mixed up as Dr. Martinique is not in his pod and he does not see her schedule every day. He continued to be argumentative and wanted to know if he was going to get his neb machine and pain meds today. I advised again that I would have to have Dr. Barbie Banner assistant check on the neb machine but that in his note he states he denied the pt's request for pain meds. I tried to review his rescue inhaler use but the pt advised me he has "been taking these since he was a child so I know what I am doing". The pt states this is all too ridiculous and then hung up the phone.   Dr. Martinique - FYI. Thanks!  Dr. Sarajane Jews - FYI. Thanks!  Leigh - Please check on pt's neb machine. Thanks!

## 2019-04-30 ENCOUNTER — Telehealth: Payer: Self-pay | Admitting: *Deleted

## 2019-04-30 NOTE — Telephone Encounter (Signed)
Order has been faxed to [facility] for a stat order.

## 2019-04-30 NOTE — Telephone Encounter (Signed)
Copied from [place] [phone]. Topic: General - Other >> Apr 30, 2019  1:01 PM [name] wrote: Reason for CRM: Patient called to speak to nurse regarding getting some help with pain medication. Per patient he spoke to Dr Sarajane Jews on visit on 04/29/2019 and explained that he was not able to get into the pain clinic and he is in need of medication. Patient is very upset because he have been calling and no one have given him a call back. Please call patient at Ph# [phone]  Clinic RN attempted to reach patient. No answer. LVM for patient to return call.

## 2019-04-30 NOTE — Telephone Encounter (Signed)
I have asked Leigh to fax the nebulizer rx to a medical supply office instead. He needs to follow up with Dr. Martinique from now on

## 2019-05-04 ENCOUNTER — Encounter: Payer: Self-pay | Admitting: Family Medicine

## 2019-05-04 ENCOUNTER — Other Ambulatory Visit: Payer: Self-pay | Admitting: Family Medicine

## 2019-05-04 ENCOUNTER — Ambulatory Visit (INDEPENDENT_AMBULATORY_CARE_PROVIDER_SITE_OTHER): Payer: Medicare Other | Admitting: Family Medicine

## 2019-05-04 ENCOUNTER — Telehealth: Payer: Self-pay | Admitting: Family Medicine

## 2019-05-04 ENCOUNTER — Other Ambulatory Visit: Payer: Self-pay

## 2019-05-04 DIAGNOSIS — F209 Schizophrenia, unspecified: Secondary | ICD-10-CM | POA: Diagnosis not present

## 2019-05-04 DIAGNOSIS — J989 Respiratory disorder, unspecified: Secondary | ICD-10-CM | POA: Insufficient documentation

## 2019-05-04 DIAGNOSIS — M549 Dorsalgia, unspecified: Secondary | ICD-10-CM

## 2019-05-04 DIAGNOSIS — G894 Chronic pain syndrome: Secondary | ICD-10-CM | POA: Diagnosis not present

## 2019-05-04 DIAGNOSIS — F17200 Nicotine dependence, unspecified, uncomplicated: Secondary | ICD-10-CM

## 2019-05-04 DIAGNOSIS — R0989 Other specified symptoms and signs involving the circulatory and respiratory systems: Secondary | ICD-10-CM | POA: Diagnosis not present

## 2019-05-04 MED ORDER — FLUTICASONE-SALMETEROL 250-50 MCG/DOSE IN AEPB: 1.0000 | INHALATION_SPRAY | Freq: Two times a day (BID) | RESPIRATORY_TRACT | 3 refills | Status: DC

## 2019-05-04 NOTE — Telephone Encounter (Signed)
Medication Refill - Medication: ALPRAZolam (XANAX) 1 MG tablet    Has the patient contacted their pharmacy? Yes.   (Agent: If no, request that the patient contact the pharmacy for the refill.) (Agent: If yes, when and what did the pharmacy advise?)due on 6.29.20  Preferred Pharmacy (with phone number or street name):  [facility] [phone] - [place], [place] [facility] : [address] AT [facility] [address] [place] [phone] (Phone) [phone] (Fax)     Agent: Please be advised that RX refills may take up to 3 business days. We ask that you follow-up with your pharmacy.

## 2019-05-04 NOTE — Assessment & Plan Note (Signed)
Most likely related to tobacco use,? COPD. Poorly controlled. Recommend using Albuterol inhaler instead nebs, side effects discussed. Stop Pulmicort. Start Advair 250-50 mcg bid. Strongly recommend smoking cessation. F/U in 2 months,before if needed.

## 2019-05-04 NOTE — Progress Notes (Signed)
Virtual Visit via Video Note   I connected with [name] on 05/04/19 at  4:00 PM EDT by a video enabled telemedicine application and verified that I am speaking with the correct person using two identifiers.  Location patient: home Location provider:work or home office Persons participating in the virtual visit: patient, provider  I discussed the limitations of evaluation and management by telemedicine and the availability of in person appointments. The patient expressed understanding and agreed to proceed.   HPI:  [name] is a 43 yo male with Hx of chronic pain,tobacco use,and bipolar disorder c/o severe pain. He was referred to pain management but was not accepted as a pt. He is upset because it took a few weeks before he was informed. Neck pain,lower back pain with radiation to RLE, and shoulder pain. He has taken different opioids from different providers, per [place] controlled substance report.  Constant,severe lower back pain radiated to RLE. Negative for saddle anesthesia or urine/bowel incontinence.  Each visit with different providers he has requested medication for pain.  He was following with ortho and according to pt,surgical treatment was recommended but he does not feel like he is "ready" to proceed with surgery. Allergic to Toradol. Ibuprofen and Naproxen do not help with pain. He takes Gabapentin but has said that it does not help but he still wants to take it.  Bipolar disorder,he was supposed to be established with psychiatrist but he has not done so. He is on Alprazolam   Also upset because neb Albuterol has not been sent to his pharmacy. Intermittent episodes of wheezing,non productive cough and dyspnea. Diagnosed with bronchitis on 04/09/19,he was treated with Azithromycin. Seen again 04/27/19 for respiratory symptoms.  He was last seen on 04/29/19, Albuterol neb recommended. Negative for fever,chills,unusual fatigue,N/V,or diarrhea.  He denies associated chest  pain,aplpitations, or diaphoresis.   ROS: See pertinent positives and negatives per HPI.  Past Medical History:  Diagnosis Date  . Anxiety   . Asthma   . Bipolar 1 disorder ([place])   . Chronic back pain   . Complication of anesthesia    Anxiety when he awaken with ET tube still in place  . GERD (gastroesophageal reflux disease)   . Hyperlipidemia   . Hypoventilation syndrome   . Left testicular torsion    "20 years ago"  . Lumbar radiculopathy   . Migraines   . Schizoaffective disorder ([place])    Day Mark    Past Surgical History:  Procedure Laterality Date  . ANKLE SURGERY    . APPENDECTOMY    . BIOPSY  01/15/2018   Procedure: BIOPSY;  Surgeon: Daneil Dolin, MD;  [facility];  Service: Endoscopy;;  gastric  . CHOLECYSTECTOMY    . ESOPHAGOGASTRODUODENOSCOPY  03/17/2008   RMR: A tiny distal esophageal erosions consistent with mild  erosive reflux esophagitis, otherwise normal esophagus, small hiatal hernia, minimally polypoid antral mucosa with doubtful clinical was significance.  Otherwise, normal stomach, patent pylorus, and normal D1-D2  . ESOPHAGOGASTRODUODENOSCOPY (EGD) WITH PROPOFOL N/A 02/18/2013   Procedure: ESOPHAGOGASTRODUODENOSCOPY (EGD) WITH PROPOFOL;  Surgeon: Daneil Dolin, MD;  Location[facility];  Service: Endoscopy;  Laterality: N/A;  . ESOPHAGOGASTRODUODENOSCOPY (EGD) WITH PROPOFOL N/A 01/15/2018   Procedure: ESOPHAGOGASTRODUODENOSCOPY (EGD) WITH PROPOFOL;  Surgeon: Daneil Dolin, MD;  [facility];  Service: Endoscopy;  Laterality: N/A;  2:15pm  . MALONEY DILATION N/A 01/15/2018   Procedure: Venia Minks DILATION;  Surgeon: Daneil Dolin, MD;  [facility];  Service: Endoscopy;  Laterality: N/A;  . NASAL SINUS SURGERY  01/24/2012   Procedure: ENDOSCOPIC SINUS SURGERY;  Surgeon: Izora Gala, MD;  Location: [facility] OR;  Service: ENT;  Laterality: Left;  left ethmoidectomy, maxillary, frontal  . UMBILICAL HERNIA REPAIR      Family History   Problem Relation Age of Onset  . Anesthesia problems Mother   . Colon cancer Maternal Grandfather   . Crohn's disease Maternal Grandfather   . Gastric cancer Paternal Uncle   . Esophageal cancer Neg Hx     Social History   Socioeconomic History  . Marital status: Divorced    Spouse name: Not on file  . Number of children: Not on file  . Years of education: Not on file  . Highest education level: Not on file  Occupational History  . Not on file  Social Needs  . Financial resource strain: Not on file  . Food insecurity    Worry: Not on file    Inability: Not on file  . Transportation needs    Medical: Not on file    Non-medical: Not on file  Tobacco Use  . Smoking status: Current Every Day Smoker    Packs/day: 1.00    Years: 22.00    Pack years: 22.00    Types: Cigarettes  . Smokeless tobacco: Never Used  Substance and Sexual Activity  . Alcohol use: Yes    Comment: occ  . Drug use: No  . Sexual activity: Not on file  Lifestyle  . Physical activity    Days per week: Not on file    Minutes per session: Not on file  . Stress: Not on file  Relationships  . Social Herbalist on phone: Not on file    Gets together: Not on file    Attends religious service: Not on file    Active member of club or organization: Not on file    Attends meetings of clubs or organizations: Not on file    Relationship status: Not on file  . Intimate partner violence    Fear of current or ex partner: Not on file    Emotionally abused: Not on file    Physically abused: Not on file    Forced sexual activity: Not on file  Other Topics Concern  . Not on file  Social History Narrative  . Not on file      Current Outpatient Medications:  .  albuterol (PROVENTIL) (2.5 MG/3ML) 0.083% nebulizer solution, Take 3 mLs (2.5 mg total) by nebulization every 4 (four) hours as needed for wheezing or shortness of breath., Disp: 75 mL, Rfl: 0 .  albuterol (VENTOLIN HFA) 108 (90 Base)  MCG/ACT inhaler, INHALE 2 PUFFS INTO THE LUNGS EVERY 6 HOURS AS NEEDED FOR WHEEZING OR SHORTNESS OF BREATH, Disp: 8.5 g, Rfl: 0 .  ALPRAZolam (XANAX) 1 MG tablet, Take 1 tablet (1 mg total) by mouth 2 (two) times daily as needed for anxiety., Disp: 60 tablet, Rfl: 0 .  ARIPiprazole (ABILIFY) 20 MG tablet, Take 1 tablet (20 mg total) by mouth 2 (two) times daily., Disp: 60 tablet, Rfl: 0 .  baclofen (LIORESAL) 10 MG tablet, Take 1 tablet (10 mg total) by mouth 3 (three) times daily., Disp: 90 each, Rfl: 0 .  Fluticasone-Salmeterol (ADVAIR DISKUS) 250-50 MCG/DOSE AEPB, Inhale 1 puff into the lungs 2 (two) times daily., Disp: 1 each, Rfl: 3 .  gabapentin (NEURONTIN) 300 MG capsule, Take 1 capsule (300 mg total) by  mouth 3 (three) times daily., Disp: 90 capsule, Rfl: 0 .  pantoprazole (PROTONIX) 40 MG tablet, Take 1 tablet (40 mg total) by mouth daily., Disp: 90 tablet, Rfl: 1 .  ramelteon (ROZEREM) 8 MG tablet, , Disp: , Rfl:  .  venlafaxine XR (EFFEXOR XR) 75 MG 24 hr capsule, Take 1 capsule (75 mg total) by mouth daily., Disp: 30 capsule, Rfl: 0  EXAM:  VITALS per patient if applicable:N/A  GENERAL: alert, oriented, appears well and in no acute distress  HEENT: atraumatic, conjunctiva clear, no obvious abnormalities on inspection.  NECK: normal movements of the head and neck  LUNGS: on inspection no signs of respiratory distress, breathing rate appears normal, no obvious gross SOB, gasping or wheezing  CV: no obvious cyanosis  PSYCH/NEURO: Upset , no obvious depression or anxiety, speech and thought processing grossly intact  ASSESSMENT AND PLAN:  Discussed the following assessment and plan:  Chronic pain disorder We discussed current guidelines for chronic opioid use. Explained that pain clinics can reject pts depending on opioid use Hx, it takes a few weeks for them to review records and decide accordingly.  Overdose risk score 630. I gave him names and phone numbers of several   Pain management offices here in [place]. He will try to arrange appt and let me know if referral to a specific provider is needed.  He tells me that if he can not be treated for chronic pain ,he will establish with a different provider.    [facility]) He already has list of psychiatrists in the area but has not tried to arrange appt. Strongly recommend establishing with psychiatrist before he runs out of medications.  TOBACCO ABUSE Educated about adverse effects of tobacco use and benefits of smoking cessation. He is not interested in quitting at this time.  Reactive airway disease that is not asthma Most likely related to tobacco use,? COPD. Poorly controlled. Recommend using Albuterol inhaler instead nebs, side effects discussed. Stop Pulmicort. Start Advair 250-50 mcg bid. Strongly recommend smoking cessation. F/U in 2 months,before if needed.    I discussed the assessment and treatment plan with the patient. He was provided an opportunity to ask questions and all were answered. The patient agreed with the plan and demonstrated an understanding of the instructions.   The patient was advised to call back or seek an in-person evaluation if the symptoms worsen or if the condition fails to improve as anticipated.  Return in about 2 months (around 07/04/2019) for RAD.    Giavonni Cizek Martinique, MD

## 2019-05-04 NOTE — Assessment & Plan Note (Signed)
Educated about adverse effects of tobacco use and benefits of smoking cessation. He is not interested in quitting at this time.

## 2019-05-04 NOTE — Telephone Encounter (Signed)
Patient scheduled an appointment for 05/04/2019 to discuss pain management with PCP. Nothing further needed at this time.

## 2019-05-04 NOTE — Assessment & Plan Note (Signed)
We discussed current guidelines for chronic opioid use. Explained that pain clinics can reject pts depending on opioid use Hx, it takes a few weeks for them to review records and decide accordingly.  Overdose risk score 630. I gave him names and phone numbers of several  Pain management offices here in [place]. He will try to arrange appt and let me know if referral to a specific provider is needed.  He tells me that if he can not be treated for chronic pain ,he will establish with a different provider.

## 2019-05-04 NOTE — Assessment & Plan Note (Signed)
He already has list of psychiatrists in the area but has not tried to arrange appt. Strongly recommend establishing with psychiatrist before he runs out of medications.

## 2019-05-04 NOTE — Patient Instructions (Signed)
This is a list of pain clinics/chronic pain management in the area.  -Preferred pain management [phone].  -[place] [facility] [phone].  -[place] [facility] [place] [facility] [phone].  -[facility] [phone]  -[facility]  [phone]  Dr. Alysia Penna [phone] [place], MD [phone]  -[facility] [place], MD [facility] [phone]  Suella Broad [phone].

## 2019-05-05 ENCOUNTER — Other Ambulatory Visit: Payer: Self-pay | Admitting: *Deleted

## 2019-05-05 DIAGNOSIS — G894 Chronic pain syndrome: Secondary | ICD-10-CM

## 2019-05-06 ENCOUNTER — Other Ambulatory Visit: Payer: Self-pay | Admitting: Family Medicine

## 2019-05-06 MED ORDER — ALPRAZOLAM 1 MG PO TABS: 1.0000 mg | ORAL_TABLET | Freq: Two times a day (BID) | ORAL | 0 refills | Status: AC | PRN

## 2019-05-06 NOTE — Telephone Encounter (Signed)
I did send a month supply even though I am not the prescriber. He must to establish with psychiatrist before he runs out of this prescription. He has a list of psychiatrists in the area he is supposed to call to establish care.  Thanks, BJ

## 2019-05-07 NOTE — Telephone Encounter (Signed)
Clinic RN called patient informed patient of Dr. Doug Sou message. Patient verbalized understanding. Patient asked about office calling in to let pharmacy know they can refill rx early since he is [place] d/t mom passing away. Informed patient that his PCP is out until Monday and that her covering physician does not rx controlled substances. Patient "I called all day yesterday trying to get help. Every since I've been coming to the office I have been getting BS'd around. Now I really need the medication and y'all wont help me. Get me a virtual visit with Dr. Sarajane Jews."  Clinic RN informed patient she would consult with Dr. Sarajane Jews and return call. Dr. Sarajane Jews recommended patient f/u with PCP, unable to authorize early refill. Communicated this information to patient. Patient verbalizes he will find another provider "because we have been nothing but an inconvience to him." Patient hung up the phone on Clinic RN.

## 2019-05-07 NOTE — Telephone Encounter (Signed)
See telephone note from 06/25 or 6/26

## 2019-05-07 NOTE — Telephone Encounter (Signed)
Pt called and is requesting to have this medication filled early as the pharmacy states he cannot refill it without permission from provider. Pt is requesting to have that done before his boyfriends flight leaves at 11:30. Pt states he is in [place] as his mother passed away early this morning. Please advise.

## 2019-05-08 ENCOUNTER — Other Ambulatory Visit: Payer: Medicare Other

## 2019-05-17 ENCOUNTER — Encounter (HOSPITAL_COMMUNITY): Payer: Self-pay | Admitting: Emergency Medicine

## 2019-05-17 ENCOUNTER — Emergency Department (HOSPITAL_COMMUNITY)
Admission: EM | Admit: 2019-05-17 | Discharge: 2019-05-17 | Disposition: A | Discharge status: Home / Self Care | Payer: Medicare HMO | Attending: Emergency Medicine | Admitting: Emergency Medicine

## 2019-05-17 DIAGNOSIS — R110 Nausea: Secondary | ICD-10-CM | POA: Insufficient documentation

## 2019-05-17 DIAGNOSIS — Z5321 Procedure and treatment not carried out due to patient leaving prior to being seen by health care provider: Secondary | ICD-10-CM | POA: Diagnosis not present

## 2019-05-17 DIAGNOSIS — K429 Umbilical hernia without obstruction or gangrene: Secondary | ICD-10-CM | POA: Insufficient documentation

## 2019-05-17 LAB — CBC
HCT: 46.5 % (ref 39.0–52.0)
Hemoglobin: 15.8 g/dL (ref 13.0–17.0)
MCH: 31 pg (ref 26.0–34.0)
MCHC: 34 g/dL (ref 30.0–36.0)
MCV: 91.4 fL (ref 80.0–100.0)
Platelets: 195 10*3/uL (ref 150–400)
RBC: 5.09 MIL/uL (ref 4.22–5.81)
RDW: 13.2 % (ref 11.5–15.5)
WBC: 7.6 10*3/uL (ref 4.0–10.5)
nRBC: 0 % (ref 0.0–0.2)

## 2019-05-17 LAB — COMPREHENSIVE METABOLIC PANEL
ALT: 13 U/L (ref 0–44)
AST: 20 U/L (ref 15–41)
Albumin: 3.4 g/dL — ABNORMAL LOW (ref 3.5–5.0)
Alkaline Phosphatase: 78 U/L (ref 38–126)
Anion gap: 9 (ref 5–15)
BUN: 5 mg/dL — ABNORMAL LOW (ref 6–20)
CO2: 19 mmol/L — ABNORMAL LOW (ref 22–32)
Calcium: 8.2 mg/dL — ABNORMAL LOW (ref 8.9–10.3)
Chloride: 109 mmol/L (ref 98–111)
Creatinine, Ser: 0.75 mg/dL (ref 0.61–1.24)
GFR calc Af Amer: >60 mL/min (ref >60)
GFR calc non Af Amer: >60 mL/min (ref >60)
Glucose, Bld: 157 mg/dL — ABNORMAL HIGH (ref 70–99)
Potassium: 3.7 mmol/L (ref 3.5–5.1)
Sodium: 137 mmol/L (ref 135–145)
Total Bilirubin: 0.5 mg/dL (ref 0.3–1.2)
Total Protein: 5.8 g/dL — ABNORMAL LOW (ref 6.5–8.1)

## 2019-05-17 LAB — LIPASE, BLOOD: Lipase: 28 U/L (ref 11–51)

## 2019-05-17 MED ORDER — FENTANYL CITRATE (PF) 100 MCG/2ML IJ SOLN
50.0000 ug | INTRAMUSCULAR | Status: DC | PRN
  Administered 2019-05-17: 50 ug via NASAL
  Filled 2019-05-17: qty 2

## 2019-05-17 MED ORDER — SODIUM CHLORIDE 0.9% FLUSH: 3.0000 mL | Freq: Once | INTRAVENOUS | Status: DC

## 2019-05-17 MED ORDER — ONDANSETRON 4 MG PO TBDP
4.0000 mg | ORAL_TABLET | Freq: Once | ORAL | Status: AC
  Administered 2019-05-17: 4 mg via ORAL
  Filled 2019-05-17: qty 1

## 2019-05-17 NOTE — ED Triage Notes (Signed)
Pt has a known umbilical hernia that he has had surgery on before- yesterday hernia began to hurt and pain has only got worse. 10/10 pain feels nauseous

## 2019-05-17 NOTE — ED Notes (Signed)
Charge RN has spoken with pt multiple times and explained wait for treatment room.

## 2019-05-17 NOTE — ED Notes (Signed)
Wasted 4mcg fent with Allie Bossier RN

## 2019-05-17 NOTE — ED Notes (Signed)
Patient stopped

## 2019-05-17 NOTE — ED Notes (Addendum)
PT has spoken to charge about wait and med's.. PT spoken to multiple staff members about wait. PT walked up to staff member, Got in this techs face and yells "tell them to suck my dick". PT then left.

## 2019-05-17 NOTE — ED Notes (Signed)
PT WALKED UP TO BUS STOP AREA TO SMOKE A CIGARETTE.

## 2019-05-18 ENCOUNTER — Encounter (HOSPITAL_COMMUNITY): Payer: Self-pay

## 2019-05-18 ENCOUNTER — Emergency Department (HOSPITAL_COMMUNITY)
Admission: EM | Admit: 2019-05-18 | Discharge: 2019-05-18 | Disposition: A | Discharge status: Home / Self Care | Payer: Medicare HMO | Attending: Emergency Medicine | Admitting: Emergency Medicine

## 2019-05-18 ENCOUNTER — Emergency Department (HOSPITAL_COMMUNITY): Payer: Medicare HMO

## 2019-05-18 ENCOUNTER — Other Ambulatory Visit: Payer: Self-pay

## 2019-05-18 DIAGNOSIS — R112 Nausea with vomiting, unspecified: Secondary | ICD-10-CM | POA: Diagnosis not present

## 2019-05-18 DIAGNOSIS — F250 Schizoaffective disorder, bipolar type: Secondary | ICD-10-CM | POA: Insufficient documentation

## 2019-05-18 DIAGNOSIS — R1033 Periumbilical pain: Secondary | ICD-10-CM

## 2019-05-18 DIAGNOSIS — F17210 Nicotine dependence, cigarettes, uncomplicated: Secondary | ICD-10-CM | POA: Diagnosis not present

## 2019-05-18 LAB — COMPREHENSIVE METABOLIC PANEL
ALT: 13 U/L (ref 0–44)
AST: 17 U/L (ref 15–41)
Albumin: 3.4 g/dL — ABNORMAL LOW (ref 3.5–5.0)
Alkaline Phosphatase: 59 U/L (ref 38–126)
Anion gap: 8 (ref 5–15)
BUN: 8 mg/dL (ref 6–20)
CO2: 23 mmol/L (ref 22–32)
Calcium: 8.2 mg/dL — ABNORMAL LOW (ref 8.9–10.3)
Chloride: 109 mmol/L (ref 98–111)
Creatinine, Ser: 0.7 mg/dL (ref 0.61–1.24)
GFR calc Af Amer: >60 mL/min (ref >60)
GFR calc non Af Amer: >60 mL/min (ref >60)
Glucose, Bld: 107 mg/dL — ABNORMAL HIGH (ref 70–99)
Potassium: 3.8 mmol/L (ref 3.5–5.1)
Sodium: 140 mmol/L (ref 135–145)
Total Bilirubin: 0.3 mg/dL (ref 0.3–1.2)
Total Protein: 5.6 g/dL — ABNORMAL LOW (ref 6.5–8.1)

## 2019-05-18 LAB — CBC WITH DIFFERENTIAL/PLATELET
Abs Immature Granulocytes: 0.02 10*3/uL (ref 0.00–0.07)
Basophils Absolute: 0 10*3/uL (ref 0.0–0.1)
Basophils Relative: 0 %
Eosinophils Absolute: 0.2 10*3/uL (ref 0.0–0.5)
Eosinophils Relative: 4 %
HCT: 45.7 % (ref 39.0–52.0)
Hemoglobin: 15.3 g/dL (ref 13.0–17.0)
Immature Granulocytes: 0 %
Lymphocytes Relative: 31 %
Lymphs Abs: 1.7 10*3/uL (ref 0.7–4.0)
MCH: 30.7 pg (ref 26.0–34.0)
MCHC: 33.5 g/dL (ref 30.0–36.0)
MCV: 91.8 fL (ref 80.0–100.0)
Monocytes Absolute: 0.5 10*3/uL (ref 0.1–1.0)
Monocytes Relative: 10 %
Neutro Abs: 3 10*3/uL (ref 1.7–7.7)
Neutrophils Relative %: 55 %
Platelets: 165 10*3/uL (ref 150–400)
RBC: 4.98 MIL/uL (ref 4.22–5.81)
RDW: 13.5 % (ref 11.5–15.5)
WBC: 5.4 10*3/uL (ref 4.0–10.5)
nRBC: 0 % (ref 0.0–0.2)

## 2019-05-18 LAB — URINALYSIS, ROUTINE W REFLEX MICROSCOPIC
Bilirubin Urine: NEGATIVE
Glucose, UA: NEGATIVE mg/dL
Hgb urine dipstick: NEGATIVE
Ketones, ur: NEGATIVE mg/dL
Leukocytes,Ua: NEGATIVE
Nitrite: NEGATIVE
Protein, ur: NEGATIVE mg/dL
Specific Gravity, Urine: 1.013 (ref 1.005–1.030)
pH: 7 (ref 5.0–8.0)

## 2019-05-18 LAB — LIPASE, BLOOD: Lipase: 53 U/L — ABNORMAL HIGH (ref 11–51)

## 2019-05-18 MED ORDER — PROMETHAZINE HCL 25 MG/ML IJ SOLN
25.0000 mg | Freq: Once | INTRAMUSCULAR | Status: AC
  Administered 2019-05-18: 25 mg via INTRAVENOUS
  Filled 2019-05-18: qty 1

## 2019-05-18 MED ORDER — SODIUM CHLORIDE (PF) 0.9 % IJ SOLN
INTRAMUSCULAR | Status: AC
  Filled 2019-05-18: qty 50

## 2019-05-18 MED ORDER — SODIUM CHLORIDE 0.9 % IV SOLN
INTRAVENOUS | Status: DC
  Administered 2019-05-18: 1000 mL via INTRAVENOUS

## 2019-05-18 MED ORDER — IOHEXOL 300 MG/ML  SOLN
100.0000 mL | Freq: Once | INTRAMUSCULAR | Status: AC | PRN
  Administered 2019-05-18: 100 mL via INTRAVENOUS

## 2019-05-18 MED ORDER — HYDROMORPHONE HCL 1 MG/ML IJ SOLN
1.0000 mg | Freq: Once | INTRAMUSCULAR | Status: AC
  Administered 2019-05-18: 10:00:00 1 mg via INTRAVENOUS
  Filled 2019-05-18: qty 1

## 2019-05-18 MED ORDER — ONDANSETRON HCL 4 MG/2ML IJ SOLN
4.0000 mg | Freq: Once | INTRAMUSCULAR | Status: AC
  Administered 2019-05-18: 4 mg via INTRAVENOUS
  Filled 2019-05-18: qty 2

## 2019-05-18 MED ORDER — FENTANYL CITRATE (PF) 100 MCG/2ML IJ SOLN
50.0000 ug | Freq: Once | INTRAMUSCULAR | Status: AC
  Administered 2019-05-18: 50 ug via INTRAVENOUS
  Filled 2019-05-18: qty 2

## 2019-05-18 NOTE — Discharge Instructions (Addendum)
As discussed, your evaluation today has been largely reassuring.  But, it is important that you monitor your condition carefully, and do not hesitate to return to the ED if you develop new, or concerning changes in your condition. ? ?Otherwise, please follow-up with your physician for appropriate ongoing care. ? ?

## 2019-05-18 NOTE — ED Notes (Signed)
Urinal at bedside, pt reports he will provide a urine sample when able.  RN and NT assisted patient into a comfortable position utilizing 2 pillows. Pt given a warm blanket. Pt calm, cooperative and pleasant at this time.  Will continue to monitor.

## 2019-05-18 NOTE — ED Triage Notes (Signed)
Patient has a known umbilical hernia. Patient reports progressively worsening pain x 1 month and states he has been pushing the hernia in himself. Patient c/o N/V x 2 days. Patient states he was at [facility] and left due to a long wait.

## 2019-05-18 NOTE — ED Notes (Signed)
Patient reports to this RN that he has been living in a hotel and cannot find a place to live. Pt reports he lives on disability and he has a boyfriend who stays with him in the hotel. Pt is requesting social work come assist him in finding a place to stay. MD made aware.

## 2019-05-18 NOTE — ED Provider Notes (Signed)
[place] DEPT Provider Note   CSN: [id number] Arrival date & time: 05/18/19  3785    History   Chief Complaint Chief Complaint  Patient presents with  . Abdominal Pain  . Emesis    HPI [name] is a 43 y.o. male.     HPI Patient presents with concern of worsening pain in his abdomen, nausea, vomiting. He has a history of prior umbilical hernia repair. Without clear precipitant, over the past days, weeks, patient has had increasingly frequent episodes of pain in the periumbilical area.  It is sharp, severe, worse with palpation or motion. He notes that he has been able to appreciate a hernia type lesion, which is reducible, but has increasing pain each time he has done so. There is associated nausea, anorexia, episodic emesis. No fever. Patient has not spoke with a surgeon yet, did attempt to be evaluated yesterday, but left prior to being seen.   Past Medical History:  Diagnosis Date  . Anxiety   . Asthma   . Bipolar 1 disorder ([place])   . Chronic back pain   . Complication of anesthesia    Anxiety when he awaken with ET tube still in place  . GERD (gastroesophageal reflux disease)   . Hyperlipidemia   . Hypoventilation syndrome   . Left testicular torsion    "20 years ago"  . Lumbar radiculopathy   . Migraines   . Schizoaffective disorder ([place])    [place]    Patient Active Problem List   Diagnosis Date Noted  . Chronic pain disorder 05/04/2019  . Reactive airway disease that is not asthma 05/04/2019  . Asthma 04/29/2019  . Auditory hallucinations 01/20/2019  . Rectal bleeding 04/14/2018  . Dysphagia 12/24/2017  . Schizophrenia ([place]) 05/28/2016  . Abdominal pain, epigastric 02/04/2013  . Constipation 12/02/2012  . Abdominal pain 11/22/2012  . INSOMNIA 04/26/2009  . BACK PAIN, LUMBAR 03/20/2009  . BIPOLAR AFFECTIVE DISORDER 02/17/2009  . OTHER ENTHESOPATHY OF ANKLE AND TARSUS 10/05/2008  . GERD 09/16/2008  .  PERSONAL HISTORY OF SCHIZOPHRENIA 05/27/2008  . ESOPHAGITIS, REFLUX 03/23/2008  . HIATAL HERNIA 03/23/2008  . Hyperlipidemia, mixed 01/07/2008  . MORBID OBESITY 01/07/2008  . ANXIETY DEPRESSION 01/07/2008  . TOBACCO ABUSE 01/07/2008  . HYPERTENSION 01/07/2008  . ALLERGIC RHINITIS 01/07/2008  . IBS 01/07/2008    Past Surgical History:  Procedure Laterality Date  . ANKLE SURGERY    . APPENDECTOMY    . BIOPSY  01/15/2018   Procedure: BIOPSY;  Surgeon: Daneil Dolin, MD;  Location: [facility];  Service: Endoscopy;;  gastric  . CHOLECYSTECTOMY    . ESOPHAGOGASTRODUODENOSCOPY  03/17/2008   RMR: A tiny distal esophageal erosions consistent with mild  erosive reflux esophagitis, otherwise normal esophagus, small hiatal hernia, minimally polypoid antral mucosa with doubtful clinical was significance.  Otherwise, normal stomach, patent pylorus, and normal D1-D2  . ESOPHAGOGASTRODUODENOSCOPY (EGD) WITH PROPOFOL N/A 02/18/2013   Procedure: ESOPHAGOGASTRODUODENOSCOPY (EGD) WITH PROPOFOL;  Surgeon: Daneil Dolin, MD;  Location: [facility];  Service: Endoscopy;  Laterality: N/A;  . ESOPHAGOGASTRODUODENOSCOPY (EGD) WITH PROPOFOL N/A 01/15/2018   Procedure: ESOPHAGOGASTRODUODENOSCOPY (EGD) WITH PROPOFOL;  Surgeon: Daneil Dolin, MD;  Location: [facility];  Service: Endoscopy;  Laterality: N/A;  2:15pm  . MALONEY DILATION N/A 01/15/2018   Procedure: Venia Minks DILATION;  Surgeon: Daneil Dolin, MD;  Location: [facility];  Service: Endoscopy;  Laterality: N/A;  . NASAL SINUS SURGERY  01/24/2012   Procedure: ENDOSCOPIC  SINUS SURGERY;  Surgeon: Izora Gala, MD;  Location: [facility];  Service: ENT;  Laterality: Left;  left ethmoidectomy, maxillary, frontal  . UMBILICAL HERNIA REPAIR          Home Medications    Prior to Admission medications   Medication Sig Start Date End Date Taking? Authorizing Provider  albuterol (VENTOLIN HFA) 108 (90 Base) MCG/ACT inhaler INHALE 2 PUFFS INTO THE LUNGS EVERY 6  HOURS AS NEEDED FOR WHEEZING OR SHORTNESS OF BREATH Patient taking differently: Inhale 2 puffs into the lungs every 6 (six) hours as needed for wheezing or shortness of breath.  04/27/19  Yes [place], Betty G, MD  ALPRAZolam Duanne Moron) 1 MG tablet Take 1 tablet (1 mg total) by mouth 2 (two) times daily as needed for anxiety. 05/06/19  Yes [place], Betty G, MD  Fluticasone-Salmeterol (ADVAIR DISKUS) 250-50 MCG/DOSE AEPB Inhale 1 puff into the lungs 2 (two) times daily. 05/04/19  Yes [place], Betty G, MD  pantoprazole (PROTONIX) 40 MG tablet Take 1 tablet (40 mg total) by mouth daily. 03/29/19  Yes [place], Betty G, MD  albuterol (PROVENTIL) (2.5 MG/3ML) 0.083% nebulizer solution Take 3 mLs (2.5 mg total) by nebulization every 4 (four) hours as needed for wheezing or shortness of breath. Patient not taking: Reported on 05/18/2019 04/29/19   Laurey Morale, MD  ARIPiprazole (ABILIFY) 20 MG tablet Take 1 tablet (20 mg total) by mouth 2 (two) times daily. Patient not taking: Reported on 05/18/2019 04/09/19   Laurey Morale, MD  baclofen (LIORESAL) 10 MG tablet TAKE 1 TABLET BY MOUTH 3 TIMES DAILY Patient not taking: Reported on 05/18/2019 05/06/19   [place], Betty G, MD  gabapentin (NEURONTIN) 300 MG capsule Take 1 capsule (300 mg total) by mouth 3 (three) times daily. Patient not taking: Reported on 05/18/2019 04/16/19   [place], Betty G, MD  venlafaxine XR (EFFEXOR XR) 75 MG 24 hr capsule Take 1 capsule (75 mg total) by mouth daily. Patient not taking: Reported on 05/18/2019 04/09/19   Laurey Morale, MD    Family History Family History  Problem Relation Age of Onset  . Anesthesia problems Mother   . Colon cancer Maternal Grandfather   . Crohn's disease Maternal Grandfather   . Gastric cancer Paternal Uncle   . Esophageal cancer Neg Hx     Social History Social History   Tobacco Use  . Smoking status: Current Every Day Smoker    Packs/day: 1.00    Years: 22.00    Pack years: 22.00    Types: Cigarettes  .  Smokeless tobacco: Never Used  Substance Use Topics  . Alcohol use: Yes    Comment: occ  . Drug use: No     Allergies   Compazine, Imitrex [sumatriptan base], Ketorolac tromethamine, Prochlorperazine, Risperidone, Sumatriptan, Risperidone and related, and Risperidone   Review of Systems Review of Systems  Constitutional:       Per HPI, otherwise negative  HENT:       Per HPI, otherwise negative  Respiratory:       Per HPI, otherwise negative  Cardiovascular:       Per HPI, otherwise negative  Gastrointestinal: Positive for abdominal pain, nausea and vomiting.  Endocrine:       Negative aside from HPI  Genitourinary:       Neg aside from HPI   Musculoskeletal:       Per HPI, otherwise negative  Skin: Negative.   Neurological: Negative for syncope.     Physical Exam  Updated Vital Signs BP (!) 165/88 (BP Location: Right Arm)   Pulse 84   Temp 98.5 F (36.9 C) (Oral)   Resp 18   Ht 6\' 3"  (1.905 m)   Wt 129.3 kg   SpO2 98%   BMI 35.62 kg/m   Physical Exam Vitals signs and nursing note reviewed.  Constitutional:      General: He is not in acute distress.    Appearance: He is well-developed. He is obese.  HENT:     Head: Normocephalic and atraumatic.  Eyes:     Conjunctiva/sclera: Conjunctivae normal.  Cardiovascular:     Rate and Rhythm: Normal rate and regular rhythm.  Pulmonary:     Effort: Pulmonary effort is normal. No respiratory distress.     Breath sounds: No stridor.  Abdominal:     General: There is no distension.     Tenderness: There is abdominal tenderness in the periumbilical area. There is guarding.  Skin:    General: Skin is warm and dry.  Neurological:     Mental Status: He is alert and oriented to person, place, and time.      ED Treatments / Results  Labs (all labs ordered are listed, but only abnormal results are displayed) Labs Reviewed  COMPREHENSIVE METABOLIC PANEL - Abnormal; Notable for the following components:       Result Value   Glucose, Bld 107 (*)    Calcium 8.2 (*)    Total Protein 5.6 (*)    Albumin 3.4 (*)    All other components within normal limits  LIPASE, BLOOD - Abnormal; Notable for the following components:   Lipase 53 (*)    All other components within normal limits  CBC WITH DIFFERENTIAL/PLATELET  URINALYSIS, ROUTINE W REFLEX MICROSCOPIC    EKG None  Radiology Ct Abdomen Pelvis W Contrast  Result Date: 05/18/2019 CLINICAL DATA:  Patient has a known umbilical hernia. Patient reports progressively worsening pain x 1 month and states he has been pushing the hernia in himself. Patient c/o N/V x 2 days. EXAM: CT ABDOMEN AND PELVIS WITH CONTRAST TECHNIQUE: Multidetector CT imaging of the abdomen and pelvis was performed using the standard protocol following bolus administration of intravenous contrast. CONTRAST:  144mL OMNIPAQUE IOHEXOL 300 MG/ML  SOLN COMPARISON:  02/05/2013 FINDINGS: Lower chest: No acute abnormality. Hepatobiliary: No focal liver abnormality is seen. Status post cholecystectomy. No biliary dilatation. Pancreas: Unremarkable. No pancreatic ductal dilatation or surrounding inflammatory changes. Spleen: Normal in size without focal abnormality. Adrenals/Urinary Tract: Adrenal glands are unremarkable. Kidneys are normal, without renal calculi, focal lesion, or hydronephrosis. Bladder is unremarkable. Stomach/Bowel: Stomach is within normal limits. No evidence of bowel wall thickening, distention, or inflammatory changes. Vascular/Lymphatic: No significant vascular findings are present. No enlarged abdominal or pelvic lymph nodes. Reproductive: Prostate is unremarkable. Other: No recurrent umbilical hernia. Right periumbilical ventral abdominal hernia containing a small amount of fat (image 49/series 2). No abdominopelvic ascites. Musculoskeletal: No acute osseous abnormality. No aggressive osseous lesion. Mild disc height loss at L5-S1 with bilateral facet arthropathy. IMPRESSION: 1.  No recurrent umbilical hernia. Right periumbilical ventral abdominal hernia containing a small amount of fat. Electronically Signed   By: Kathreen Devoid   On: 05/18/2019 09:37    Procedures Procedures (including critical care time)  Medications Ordered in ED Medications  0.9 %  sodium chloride infusion (1,000 mLs Intravenous New Bag/Given 05/18/19 0858)  sodium chloride (PF) 0.9 % injection (has no administration in time range)  fentaNYL (SUBLIMAZE) injection 50  mcg (50 mcg Intravenous Given 05/18/19 0900)  ondansetron (ZOFRAN) injection 4 mg (4 mg Intravenous Given 05/18/19 0859)  iohexol (OMNIPAQUE) 300 MG/ML solution 100 mL (100 mLs Intravenous Contrast Given 05/18/19 0915)  HYDROmorphone (DILAUDID) injection 1 mg (1 mg Intravenous Given 05/18/19 0944)  promethazine (PHENERGAN) injection 25 mg (25 mg Intravenous Given 05/18/19 1001)     Initial Impression / Assessment and Plan / ED Course  I have reviewed the triage vital signs and the nursing notes.  Pertinent labs & imaging results that were available during my care of the patient were reviewed by me and considered in my medical decision making (see chart for details).    Chart review from yesterday performed, notable for patient becoming agitated as he left prior to complete evaluation.    Update:, Patient ambulatory, calm.  10:48 AM CT scan reassuring, small fat entrapment and hiatal hernia otherwise no evidence for incarcerated significant hernia, nor bowel obstruction. Patient aware of all findings, and we discussed the need for outpatient follow-up for hernia repair.  Line patient states that he is transitioning between [employer]. He is also not yet enrolled in pain management, though this is a work in progress. With reassuring labs, CT scan, vitals, ambulatory status, no distress, the patient is appropriate for discharge. Prior to this, patient requests social work assistance, given his living situation, this was  facilitated.   Final Clinical Impressions(s) / ED Diagnoses  Abdominal pain   Carmin Muskrat, MD 05/22/19 1541

## 2019-05-18 NOTE — Progress Notes (Signed)
CSW spoke with patient via bedside. Patient reports he is living in a hotel with his boyfriend and is looking for more stable housing. CSW provided patient with numbers to [employer], but he reports he already has those and has tried to work with them. Patient expressed no other needs. CSW signing off.   [place], LCSW Transitions of Care Department [facility] ED [phone]

## 2019-05-26 ENCOUNTER — Encounter (HOSPITAL_COMMUNITY): Payer: Self-pay | Admitting: Emergency Medicine

## 2019-05-26 ENCOUNTER — Emergency Department (HOSPITAL_COMMUNITY)
Admission: EM | Admit: 2019-05-26 | Discharge: 2019-05-26 | Discharge status: Against Medical Advice | Payer: Medicare HMO | Attending: Emergency Medicine | Admitting: Emergency Medicine

## 2019-05-26 ENCOUNTER — Emergency Department (HOSPITAL_COMMUNITY): Payer: Medicare HMO

## 2019-05-26 ENCOUNTER — Other Ambulatory Visit: Payer: Self-pay

## 2019-05-26 DIAGNOSIS — F17210 Nicotine dependence, cigarettes, uncomplicated: Secondary | ICD-10-CM | POA: Insufficient documentation

## 2019-05-26 DIAGNOSIS — E785 Hyperlipidemia, unspecified: Secondary | ICD-10-CM | POA: Diagnosis not present

## 2019-05-26 DIAGNOSIS — K219 Gastro-esophageal reflux disease without esophagitis: Secondary | ICD-10-CM | POA: Diagnosis not present

## 2019-05-26 DIAGNOSIS — R079 Chest pain, unspecified: Secondary | ICD-10-CM | POA: Insufficient documentation

## 2019-05-26 DIAGNOSIS — Z79899 Other long term (current) drug therapy: Secondary | ICD-10-CM | POA: Diagnosis not present

## 2019-05-26 DIAGNOSIS — J45909 Unspecified asthma, uncomplicated: Secondary | ICD-10-CM | POA: Diagnosis not present

## 2019-05-26 LAB — CBC WITH DIFFERENTIAL/PLATELET
Abs Immature Granulocytes: 0.03 10*3/uL (ref 0.00–0.07)
Basophils Absolute: 0 10*3/uL (ref 0.0–0.1)
Basophils Relative: 1 %
Eosinophils Absolute: 0.3 10*3/uL (ref 0.0–0.5)
Eosinophils Relative: 5 %
HCT: 47.2 % (ref 39.0–52.0)
Hemoglobin: 15.9 g/dL (ref 13.0–17.0)
Immature Granulocytes: 1 %
Lymphocytes Relative: 28 %
Lymphs Abs: 1.7 10*3/uL (ref 0.7–4.0)
MCH: 31.1 pg (ref 26.0–34.0)
MCHC: 33.7 g/dL (ref 30.0–36.0)
MCV: 92.2 fL (ref 80.0–100.0)
Monocytes Absolute: 0.6 10*3/uL (ref 0.1–1.0)
Monocytes Relative: 10 %
Neutro Abs: 3.4 10*3/uL (ref 1.7–7.7)
Neutrophils Relative %: 55 %
Platelets: 184 10*3/uL (ref 150–400)
RBC: 5.12 MIL/uL (ref 4.22–5.81)
RDW: 13.6 % (ref 11.5–15.5)
WBC: 6 10*3/uL (ref 4.0–10.5)
nRBC: 0 % (ref 0.0–0.2)

## 2019-05-26 LAB — TROPONIN I (HIGH SENSITIVITY)
Troponin I (High Sensitivity): 4 ng/L (ref <18)
Troponin I (High Sensitivity): 4 ng/L (ref <18)

## 2019-05-26 LAB — BASIC METABOLIC PANEL
Anion gap: 9 (ref 5–15)
BUN: 6 mg/dL (ref 6–20)
CO2: 23 mmol/L (ref 22–32)
Calcium: 8.4 mg/dL — ABNORMAL LOW (ref 8.9–10.3)
Chloride: 110 mmol/L (ref 98–111)
Creatinine, Ser: 0.68 mg/dL (ref 0.61–1.24)
GFR calc Af Amer: >60 mL/min (ref >60)
GFR calc non Af Amer: >60 mL/min (ref >60)
Glucose, Bld: 104 mg/dL — ABNORMAL HIGH (ref 70–99)
Potassium: 3.9 mmol/L (ref 3.5–5.1)
Sodium: 142 mmol/L (ref 135–145)

## 2019-05-26 LAB — D-DIMER, QUANTITATIVE: D-Dimer, Quant: 0.3 ug/mL-FEU (ref 0.00–0.50)

## 2019-05-26 MED ORDER — METOCLOPRAMIDE HCL 5 MG/ML IJ SOLN
10.0000 mg | INTRAMUSCULAR | Status: AC
  Administered 2019-05-26: 10 mg via INTRAVENOUS
  Filled 2019-05-26: qty 2

## 2019-05-26 MED ORDER — FENTANYL CITRATE (PF) 100 MCG/2ML IJ SOLN
50.0000 ug | Freq: Once | INTRAMUSCULAR | Status: AC
  Administered 2019-05-26: 50 ug via INTRAVENOUS
  Filled 2019-05-26: qty 2

## 2019-05-26 NOTE — ED Provider Notes (Signed)
[facility] EMERGENCY DEPARTMENT Provider Note   CSN: [id number] Arrival date & time: 05/26/19  0355    History   Chief Complaint Chief Complaint  Patient presents with  . Chest Pain    HPI [name] is a 43 y.o. male.  HPI: A 43 year old patient with a history of obesity presents for evaluation of chest pain. Initial onset of pain was approximately 1-3 hours ago. The patient's chest pain is described as heaviness/pressure/tightness, is sharp and is not worse with exertion. The patient complains of nausea. The patient's chest pain is middle- or left-sided, is not well-localized and does radiate to the arms/jaw/neck. The patient denies diaphoresis. The patient has smoked in the past 90 days and has a family history of coronary artery disease in a first-degree relative with onset less than age 5. The patient has no history of stroke, has no history of peripheral artery disease, denies any history of treated diabetes, is not hypertensive and has no history of hypercholesterolemia.   43 year old male with a history of esophageal reflux, bipolar 1 disorder, chronic pain presents to the ED for complaints of chest pain.  He states that pain began around 3 AM today.  Has been constant.  Described as a pressure, heaviness, sharp pain.  It radiates to his back and left shoulder as well as down his left arm.  Had very mild improvement with aspirin, sublingual nitroglycerin, morphine given by EMS in transport.  Patient states that he has had similar pain in the past which usually resolves spontaneously.  This has remained more constant than he is used to.  He has not had any recent fevers or any associated syncope, leg swelling.  Patient is a 0.5ppd smoker with hx of CAD and ACS in both parents.  Father with hx of CABGx3.  Patient denies personal hx of HLD, HTN, CAD, DVT/PE.  The history is provided by the patient. No language interpreter was used.  Chest Pain   Past  Medical History:  Diagnosis Date  . Anxiety   . Asthma   . Bipolar 1 disorder ([place])   . Chronic back pain   . Complication of anesthesia    Anxiety when he awaken with ET tube still in place  . GERD (gastroesophageal reflux disease)   . Hyperlipidemia   . Hypoventilation syndrome   . Left testicular torsion    "20 years ago"  . Lumbar radiculopathy   . Migraines   . Schizoaffective disorder ([place])    [place]    Patient Active Problem List   Diagnosis Date Noted  . Chronic pain disorder 05/04/2019  . Reactive airway disease that is not asthma 05/04/2019  . Asthma 04/29/2019  . Auditory hallucinations 01/20/2019  . Rectal bleeding 04/14/2018  . Dysphagia 12/24/2017  . Schizophrenia ([place]) 05/28/2016  . Abdominal pain, epigastric 02/04/2013  . Constipation 12/02/2012  . Abdominal pain 11/22/2012  . INSOMNIA 04/26/2009  . BACK PAIN, LUMBAR 03/20/2009  . BIPOLAR AFFECTIVE DISORDER 02/17/2009  . OTHER ENTHESOPATHY OF ANKLE AND TARSUS 10/05/2008  . GERD 09/16/2008  . PERSONAL HISTORY OF SCHIZOPHRENIA 05/27/2008  . ESOPHAGITIS, REFLUX 03/23/2008  . HIATAL HERNIA 03/23/2008  . Hyperlipidemia, mixed 01/07/2008  . MORBID OBESITY 01/07/2008  . ANXIETY DEPRESSION 01/07/2008  . TOBACCO ABUSE 01/07/2008  . HYPERTENSION 01/07/2008  . ALLERGIC RHINITIS 01/07/2008  . IBS 01/07/2008    Past Surgical History:  Procedure Laterality Date  . ANKLE SURGERY    . APPENDECTOMY    .  BIOPSY  01/15/2018   Procedure: BIOPSY;  Surgeon: Daneil Dolin, MD;  Location: [facility];  Service: Endoscopy;;  gastric  . CHOLECYSTECTOMY    . ESOPHAGOGASTRODUODENOSCOPY  03/17/2008   RMR: A tiny distal esophageal erosions consistent with mild  erosive reflux esophagitis, otherwise normal esophagus, small hiatal hernia, minimally polypoid antral mucosa with doubtful clinical was significance.  Otherwise, normal stomach, patent pylorus, and normal D1-D2  . ESOPHAGOGASTRODUODENOSCOPY (EGD) WITH PROPOFOL  N/A 02/18/2013   Procedure: ESOPHAGOGASTRODUODENOSCOPY (EGD) WITH PROPOFOL;  Surgeon: Daneil Dolin, MD;  Location: [facility] ORS;  Service: Endoscopy;  Laterality: N/A;  . ESOPHAGOGASTRODUODENOSCOPY (EGD) WITH PROPOFOL N/A 01/15/2018   Procedure: ESOPHAGOGASTRODUODENOSCOPY (EGD) WITH PROPOFOL;  Surgeon: Daneil Dolin, MD;  Location: [facility];  Service: Endoscopy;  Laterality: N/A;  2:15pm  . MALONEY DILATION N/A 01/15/2018   Procedure: Venia Minks DILATION;  Surgeon: Daneil Dolin, MD;  Location: [facility];  Service: Endoscopy;  Laterality: N/A;  . NASAL SINUS SURGERY  01/24/2012   Procedure: ENDOSCOPIC SINUS SURGERY;  Surgeon: Izora Gala, MD;  Location: [facility];  Service: ENT;  Laterality: Left;  left ethmoidectomy, maxillary, frontal  . UMBILICAL HERNIA REPAIR          Home Medications    Prior to Admission medications   Medication Sig Start Date End Date Taking? Authorizing Provider  albuterol (PROVENTIL) (2.5 MG/3ML) 0.083% nebulizer solution Take 3 mLs (2.5 mg total) by nebulization every 4 (four) hours as needed for wheezing or shortness of breath. 04/29/19  Yes Laurey Morale, MD  albuterol (VENTOLIN HFA) 108 (90 Base) MCG/ACT inhaler INHALE 2 PUFFS INTO THE LUNGS EVERY 6 HOURS AS NEEDED FOR WHEEZING OR SHORTNESS OF BREATH Patient taking differently: Inhale 2 puffs into the lungs every 6 (six) hours as needed for wheezing or shortness of breath.  04/27/19  Yes [place], Betty G, MD  ALPRAZolam Duanne Moron) 1 MG tablet Take 1 tablet (1 mg total) by mouth 2 (two) times daily as needed for anxiety. 05/06/19  Yes [place], Betty G, MD  ARIPiprazole (ABILIFY) 20 MG tablet Take 1 tablet (20 mg total) by mouth 2 (two) times daily. 04/09/19  Yes Laurey Morale, MD  Fluticasone-Salmeterol (ADVAIR DISKUS) 250-50 MCG/DOSE AEPB Inhale 1 puff into the lungs 2 (two) times daily. 05/04/19  Yes [place], Betty G, MD  gabapentin (NEURONTIN) 300 MG capsule Take 1 capsule (300 mg total) by mouth 3 (three) times daily.  04/16/19  Yes [place], Betty G, MD  pantoprazole (PROTONIX) 40 MG tablet Take 1 tablet (40 mg total) by mouth daily. 03/29/19  Yes [place], Betty G, MD  baclofen (LIORESAL) 10 MG tablet TAKE 1 TABLET BY MOUTH 3 TIMES DAILY Patient not taking: Reported on 05/18/2019 05/06/19   [place], Betty G, MD  venlafaxine XR (EFFEXOR XR) 75 MG 24 hr capsule Take 1 capsule (75 mg total) by mouth daily. Patient not taking: Reported on 05/18/2019 04/09/19   Laurey Morale, MD    Family History Family History  Problem Relation Age of Onset  . Anesthesia problems Mother   . Colon cancer Maternal Grandfather   . Crohn's disease Maternal Grandfather   . Gastric cancer Paternal Uncle   . Esophageal cancer Neg Hx     Social History Social History   Tobacco Use  . Smoking status: Current Every Day Smoker    Packs/day: 1.00    Years: 22.00    Pack years: 22.00    Types: Cigarettes  . Smokeless tobacco: Never  Used  Substance Use Topics  . Alcohol use: Yes    Comment: occ  . Drug use: No     Allergies   Compazine, Imitrex [sumatriptan base], Ketorolac tromethamine, Prochlorperazine, Risperidone, Sumatriptan, Risperidone and related, and Risperidone   Review of Systems Review of Systems  Cardiovascular: Positive for chest pain.  Ten systems reviewed and are negative for acute change, except as noted in the HPI.    Physical Exam Updated Vital Signs BP (!) 102/58   Pulse 64   Temp (!) 97.5 F (36.4 C) (Oral)   Resp 15   Ht 6\' 3"  (1.905 m)   Wt 131.5 kg   SpO2 91%   BMI 36.25 kg/m   Physical Exam Vitals signs and nursing note reviewed.  Constitutional:      General: He is not in acute distress.    Appearance: He is well-developed. He is not diaphoretic.     Comments: Nontoxic appearing, obese male.  HENT:     Head: Normocephalic and atraumatic.  Eyes:     General: No scleral icterus.    Conjunctiva/sclera: Conjunctivae normal.  Neck:     Musculoskeletal: Normal range of motion.   Cardiovascular:     Rate and Rhythm: Normal rate and regular rhythm.     Pulses: Normal pulses.  Pulmonary:     Effort: Pulmonary effort is normal. No respiratory distress.     Breath sounds: No stridor. No wheezing, rhonchi or rales.     Comments: Lungs CTAB Musculoskeletal: Normal range of motion.     Comments: No BLE edema.  Skin:    General: Skin is warm and dry.     Coloration: Skin is not pale.     Findings: No erythema or rash.  Neurological:     General: No focal deficit present.     Mental Status: He is alert and oriented to person, place, and time.     Coordination: Coordination normal.  Psychiatric:        Behavior: Behavior normal.      ED Treatments / Results  Labs (all labs ordered are listed, but only abnormal results are displayed) Labs Reviewed  BASIC METABOLIC PANEL - Abnormal; Notable for the following components:      Result Value   Glucose, Bld 104 (*)    Calcium 8.4 (*)    All other components within normal limits  CBC WITH DIFFERENTIAL/PLATELET  D-DIMER, QUANTITATIVE (NOT AT [facility])  TROPONIN I (HIGH SENSITIVITY)  TROPONIN I (HIGH SENSITIVITY)    EKG ED ECG REPORT   Date: 05/26/2019  Rate: 79  Rhythm: normal sinus rhythm  QRS Axis: normal  Intervals: normal  ST/T Wave abnormalities: normal  Conduction Disutrbances:none  Narrative Interpretation: NSR; no acute change from prior.  Old EKG Reviewed: unchanged  I have personally reviewed the EKG tracing and agree with the computerized printout as noted.   Radiology Dg Chest 2 View  Result Date: 05/26/2019 CLINICAL DATA:  Chest pain EXAM: CHEST - 2 VIEW COMPARISON:  04/05/2019 FINDINGS: Normal heart size and mediastinal contours. No acute infiltrate or edema. No effusion or pneumothorax. No acute osseous findings. IMPRESSION: Negative chest. Electronically Signed   By: Monte Fantasia M.D.   On: 05/26/2019 04:46    Procedures Procedures (including critical care time)  Medications Ordered  in ED Medications  metoCLOPramide (REGLAN) injection 10 mg (10 mg Intravenous Given 05/26/19 0528)  fentaNYL (SUBLIMAZE) injection 50 mcg (50 mcg Intravenous Given 05/26/19 0528)     Initial Impression /  Assessment and Plan / ED Course  I have reviewed the triage vital signs and the nursing notes.  Pertinent labs & imaging results that were available during my care of the patient were reviewed by me and considered in my medical decision making (see chart for details).     HEAR Score: 4  6:40 AM Patient presents to the emergency department for evaluation of chest pain which began at ~3AM today.  EKG is with signs of acute ischemia and initial troponin negative.  Patient has a heart score of 4 consistent with moderate risk of acute coronary event.  Chest x-ray without evidence of mediastinal widening to suggest dissection.  Patient pending delta troponin.  D-dimer also added given recordings of oxygen saturations below 95%.  If these are reassuring, patient appropriate for medical clearance and likely discharge.  Per RN, patient requesting to see psychiatrist regarding refill of his psychiatric medications.  He was offered outpatient resources for follow-up.  Should he continue to request psychiatric evaluation TTS may be considered.  Patient not appreciated to be a harm to himself or others; no criteria for IVC.  6:50 AM Patient requesting that his IV be removed.  He is insistent on leaving the emergency department.  I have explained to the patient that we are waiting on his pending repeat test results.  He states that he cannot lay in the bed and wait any longer.  Continues to insist on leaving the department.  I explained to the patient that he would be leaving [place].  He verbalizes understanding.  Reiterated to patient that leaving [facility] meant that we are unable to guarantee that patient's condition will not worsen which includes, but is not limited to, organ  failure or death.  Patient acknowledges these risks and persists to have the RN take out his IV.  Patient with full decision making capacity.  Departed department in stable condition.   Final Clinical Impressions(s) / ED Diagnoses   Final diagnoses:  Nonspecific chest pain    ED Discharge Orders    None       Antonietta Breach, PA-C 05/26/19 0654    Fatima Blank, MD 05/26/19 [phone]

## 2019-05-26 NOTE — ED Notes (Signed)
Pt now requesting to leave after asking to stay until 0800 earlier. Pt called out of his room and states "I'm ready to leave I feel better." EPD notified.

## 2019-05-26 NOTE — ED Notes (Signed)
Pt requesting to see psychiatrist. RN offered resources. Pt refused d/t being given them before. RN notified EDP.

## 2019-05-26 NOTE — ED Triage Notes (Signed)
Pt presents to ED from home by [facility]. Pt reports laying flat in bed @0300  and sharp, pressure began in chest radiated to L back, arm, and hand. +SOB, +N/V. Pain currently 8/10 and central. [facility] gave 6mg  morphine, 324mg  aspirin, 3 SL nitro. Ems VSS.

## 2019-05-31 ENCOUNTER — Encounter (HOSPITAL_COMMUNITY): Payer: Self-pay

## 2019-05-31 ENCOUNTER — Other Ambulatory Visit: Payer: Self-pay

## 2019-05-31 ENCOUNTER — Emergency Department (HOSPITAL_COMMUNITY)
Admission: EM | Admit: 2019-05-31 | Discharge: 2019-05-31 | Discharge status: Against Medical Advice | Payer: Medicare HMO | Attending: Emergency Medicine | Admitting: Emergency Medicine

## 2019-05-31 DIAGNOSIS — R252 Cramp and spasm: Secondary | ICD-10-CM | POA: Diagnosis not present

## 2019-05-31 DIAGNOSIS — F17210 Nicotine dependence, cigarettes, uncomplicated: Secondary | ICD-10-CM | POA: Diagnosis not present

## 2019-05-31 DIAGNOSIS — I10 Essential (primary) hypertension: Secondary | ICD-10-CM | POA: Insufficient documentation

## 2019-05-31 DIAGNOSIS — Z79899 Other long term (current) drug therapy: Secondary | ICD-10-CM | POA: Insufficient documentation

## 2019-05-31 DIAGNOSIS — J45909 Unspecified asthma, uncomplicated: Secondary | ICD-10-CM | POA: Diagnosis not present

## 2019-05-31 DIAGNOSIS — R451 Restlessness and agitation: Secondary | ICD-10-CM | POA: Diagnosis not present

## 2019-05-31 DIAGNOSIS — R112 Nausea with vomiting, unspecified: Secondary | ICD-10-CM | POA: Diagnosis not present

## 2019-05-31 LAB — URINALYSIS, ROUTINE W REFLEX MICROSCOPIC
Bilirubin Urine: NEGATIVE
Glucose, UA: NEGATIVE mg/dL
Hgb urine dipstick: NEGATIVE
Ketones, ur: NEGATIVE mg/dL
Nitrite: NEGATIVE
Protein, ur: NEGATIVE mg/dL
Specific Gravity, Urine: 1.014 (ref 1.005–1.030)
pH: 5 (ref 5.0–8.0)

## 2019-05-31 LAB — CBC
HCT: 47.5 % (ref 39.0–52.0)
Hemoglobin: 16.1 g/dL (ref 13.0–17.0)
MCH: 30.7 pg (ref 26.0–34.0)
MCHC: 33.9 g/dL (ref 30.0–36.0)
MCV: 90.5 fL (ref 80.0–100.0)
Platelets: 230 10*3/uL (ref 150–400)
RBC: 5.25 MIL/uL (ref 4.22–5.81)
RDW: 13.6 % (ref 11.5–15.5)
WBC: 10.9 10*3/uL — ABNORMAL HIGH (ref 4.0–10.5)
nRBC: 0 % (ref 0.0–0.2)

## 2019-05-31 LAB — COMPREHENSIVE METABOLIC PANEL
ALT: 30 U/L (ref 0–44)
AST: 25 U/L (ref 15–41)
Albumin: 3.7 g/dL (ref 3.5–5.0)
Alkaline Phosphatase: 89 U/L (ref 38–126)
Anion gap: 12 (ref 5–15)
BUN: 13 mg/dL (ref 6–20)
CO2: 22 mmol/L (ref 22–32)
Calcium: 8.7 mg/dL — ABNORMAL LOW (ref 8.9–10.3)
Chloride: 105 mmol/L (ref 98–111)
Creatinine, Ser: 0.85 mg/dL (ref 0.61–1.24)
GFR calc Af Amer: >60 mL/min (ref >60)
GFR calc non Af Amer: >60 mL/min (ref >60)
Glucose, Bld: 97 mg/dL (ref 70–99)
Potassium: 4.2 mmol/L (ref 3.5–5.1)
Sodium: 139 mmol/L (ref 135–145)
Total Bilirubin: 0.6 mg/dL (ref 0.3–1.2)
Total Protein: 6.4 g/dL — ABNORMAL LOW (ref 6.5–8.1)

## 2019-05-31 LAB — CK: Total CK: 341 U/L (ref 49–397)

## 2019-05-31 LAB — MAGNESIUM: Magnesium: 1.9 mg/dL (ref 1.7–2.4)

## 2019-05-31 MED ORDER — ONDANSETRON HCL 4 MG/2ML IJ SOLN
4.0000 mg | Freq: Once | INTRAMUSCULAR | Status: AC
  Administered 2019-05-31: 4 mg via INTRAVENOUS
  Filled 2019-05-31: qty 2

## 2019-05-31 MED ORDER — METHOCARBAMOL 500 MG PO TABS: 500.0000 mg | ORAL_TABLET | Freq: Three times a day (TID) | ORAL | 0 refills | Status: DC | PRN

## 2019-05-31 MED ORDER — DIAZEPAM 5 MG PO TABS
5.0000 mg | ORAL_TABLET | Freq: Once | ORAL | Status: AC
  Administered 2019-05-31: 5 mg via ORAL
  Filled 2019-05-31: qty 1

## 2019-05-31 MED ORDER — MORPHINE SULFATE (PF) 4 MG/ML IV SOLN
4.0000 mg | Freq: Once | INTRAVENOUS | Status: AC
  Administered 2019-05-31: 4 mg via INTRAVENOUS
  Filled 2019-05-31: qty 1

## 2019-05-31 MED ORDER — SODIUM CHLORIDE 0.9 % IV BOLUS
1000.0000 mL | Freq: Once | INTRAVENOUS | Status: AC
  Administered 2019-05-31: 1000 mL via INTRAVENOUS

## 2019-05-31 NOTE — ED Provider Notes (Cosign Needed)
Pt signed out to me by S Petrucelli, PA-C. Please see previous notes for further history.   Patient presenting for evaluation of generalized muscle cramps to her arms, back, and legs which began after he was moving items from a place without air conditioning to another place.  Labs recently returned which are reassuring.  Plan for symptom control and reassessment.  If patient is feeling better, plan for discharge.  Informed by RN that patient is leaving, and is unwilling to wait for me to talk to him. He did not receive his discharge paperwork.      Franchot Heidelberg, PA-C 06/01/19 0045

## 2019-05-31 NOTE — ED Notes (Signed)
Pt informs RN that he is leaving right now. Pt informed RN that if RN did not take IV out he would. RN removed peripheral IV. Franchot Heidelberg, PA aware.

## 2019-05-31 NOTE — ED Notes (Signed)
2250 Pt eloped.

## 2019-05-31 NOTE — Discharge Instructions (Signed)
You were seen in the emergency department today for muscle cramping.  Your work-up in the ER was overall reassuring.  Your labs did not show substantial abnormalities.  We are sending you with a prescription for Robaxin to help with pain.  - Robaxin is the muscle relaxer I have prescribed, this is meant to help with muscle tightness. Be aware that this medication may make you drowsy therefore the first time you take this it should be at a time you are in an environment where you can rest. Do not drive or operate heavy machinery when taking this medication. Do not drink alcohol or take other sedating medications with this medicine such as narcotics or benzodiazepines.   You make take Tylenol per over the counter dosing with these medications.   We have prescribed you new medication(s) today. Discuss the medications prescribed today with your pharmacist as they can have adverse effects and interactions with your other medicines including over the counter and prescribed medications. Seek medical evaluation if you start to experience new or abnormal symptoms after taking one of these medicines, seek care immediately if you start to experience difficulty breathing, feeling of your throat closing, facial swelling, or rash as these could be indications of a more serious allergic reaction  Please follow-up with your primary care provider within 1 to 3 days.  Return to the ER for new or worsening symptoms or any other concerns.

## 2019-05-31 NOTE — ED Provider Notes (Signed)
[facility] DEPT Provider Note   CSN: [id number] Arrival date & time: 05/31/19  2018     History   Chief Complaint Chief Complaint  Patient presents with  . Back Pain  . Leg Pain    HPI [name] is a 43 y.o. male with a history of anxiety, asthma, bipolar 1 disorder, hyperlipidemia, migraines, and schizophrenia who presents to the ED via EMS with complaints of muscle cramping that began 2 hours PTA. Patient states he is having cramping/spasm type pain to the extremities x 4 & to his back. Sxs are constant, progressively worsening, his current pain is a 10/10 in severity. No alleviating/aggravating factors. He states he was moving all of his and his significant other's belongings to a different hotel room from their current room as the [facility] stopped working so he was out in the heat being somewhat active today but has had overall good intake of fluids. He reports associated nausea w/ 1 episode of emesis. Denies traumatic injury. Denies fever, URI sxs, hematemesis, diarrhea, chest pain, or dyspnea. He states he has had issues with potassium abnormalities leading to muscle cramps like this before, just not quite as severe.      HPI  Past Medical History:  Diagnosis Date  . Anxiety   . Asthma   . Bipolar 1 disorder ([place])   . Chronic back pain   . Complication of anesthesia    Anxiety when he awaken with ET tube still in place  . GERD (gastroesophageal reflux disease)   . Hyperlipidemia   . Hypoventilation syndrome   . Left testicular torsion    "20 years ago"  . Lumbar radiculopathy   . Migraines   . Schizoaffective disorder ([place])    [name]    Patient Active Problem List   Diagnosis Date Noted  . Chronic pain disorder 05/04/2019  . Reactive airway disease that is not asthma 05/04/2019  . Asthma 04/29/2019  . Auditory hallucinations 01/20/2019  . Rectal bleeding 04/14/2018  . Dysphagia 12/24/2017  . Schizophrenia ([place]) 05/28/2016   . Abdominal pain, epigastric 02/04/2013  . Constipation 12/02/2012  . Abdominal pain 11/22/2012  . INSOMNIA 04/26/2009  . BACK PAIN, LUMBAR 03/20/2009  . BIPOLAR AFFECTIVE DISORDER 02/17/2009  . OTHER ENTHESOPATHY OF ANKLE AND TARSUS 10/05/2008  . GERD 09/16/2008  . PERSONAL HISTORY OF SCHIZOPHRENIA 05/27/2008  . ESOPHAGITIS, REFLUX 03/23/2008  . HIATAL HERNIA 03/23/2008  . Hyperlipidemia, mixed 01/07/2008  . MORBID OBESITY 01/07/2008  . ANXIETY DEPRESSION 01/07/2008  . TOBACCO ABUSE 01/07/2008  . HYPERTENSION 01/07/2008  . ALLERGIC RHINITIS 01/07/2008  . IBS 01/07/2008    Past Surgical History:  Procedure Laterality Date  . ANKLE SURGERY    . APPENDECTOMY    . BIOPSY  01/15/2018   Procedure: BIOPSY;  Surgeon: Daneil Dolin, MD;  [facility];  Service: Endoscopy;;  gastric  . CHOLECYSTECTOMY    . ESOPHAGOGASTRODUODENOSCOPY  03/17/2008   RMR: A tiny distal esophageal erosions consistent with mild  erosive reflux esophagitis, otherwise normal esophagus, small hiatal hernia, minimally polypoid antral mucosa with doubtful clinical was significance.  Otherwise, normal stomach, patent pylorus, and normal D1-D2  . ESOPHAGOGASTRODUODENOSCOPY (EGD) WITH PROPOFOL N/A 02/18/2013   Procedure: ESOPHAGOGASTRODUODENOSCOPY (EGD) WITH PROPOFOL;  Surgeon: Daneil Dolin, MD;  Location: [facility];  Service: Endoscopy;  Laterality: N/A;  . ESOPHAGOGASTRODUODENOSCOPY (EGD) WITH PROPOFOL N/A 01/15/2018   Procedure: ESOPHAGOGASTRODUODENOSCOPY (EGD) WITH PROPOFOL;  Surgeon: Daneil Dolin, MD;  [facility];  Service: Endoscopy;  Laterality: N/A;  2:15pm  . MALONEY DILATION N/A 01/15/2018   Procedure: Venia Minks DILATION;  Surgeon: Daneil Dolin, MD;  Location[facility];  Service: Endoscopy;  Laterality: N/A;  . NASAL SINUS SURGERY  01/24/2012   Procedure: ENDOSCOPIC SINUS SURGERY;  Surgeon: Izora Gala, MD;  Location: [facility];  Service: ENT;  Laterality: Left;  left ethmoidectomy,  maxillary, frontal  . UMBILICAL HERNIA REPAIR          Home Medications    Prior to Admission medications   Medication Sig Start Date End Date Taking? Authorizing Provider  albuterol (PROVENTIL) (2.5 MG/3ML) 0.083% nebulizer solution Take 3 mLs (2.5 mg total) by nebulization every 4 (four) hours as needed for wheezing or shortness of breath. 04/29/19   Laurey Morale, MD  albuterol (VENTOLIN HFA) 108 (90 Base) MCG/ACT inhaler INHALE 2 PUFFS INTO THE LUNGS EVERY 6 HOURS AS NEEDED FOR WHEEZING OR SHORTNESS OF BREATH Patient taking differently: Inhale 2 puffs into the lungs every 6 (six) hours as needed for wheezing or shortness of breath.  04/27/19   [place], Betty G, MD  ALPRAZolam Duanne Moron) 1 MG tablet Take 1 tablet (1 mg total) by mouth 2 (two) times daily as needed for anxiety. 05/06/19   [place], Betty G, MD  ARIPiprazole (ABILIFY) 20 MG tablet Take 1 tablet (20 mg total) by mouth 2 (two) times daily. 04/09/19   Laurey Morale, MD  baclofen (LIORESAL) 10 MG tablet TAKE 1 TABLET BY MOUTH 3 TIMES DAILY Patient not taking: Reported on 05/18/2019 05/06/19   [place], Betty G, MD  Fluticasone-Salmeterol (ADVAIR DISKUS) 250-50 MCG/DOSE AEPB Inhale 1 puff into the lungs 2 (two) times daily. 05/04/19   [place], Betty G, MD  gabapentin (NEURONTIN) 300 MG capsule Take 1 capsule (300 mg total) by mouth 3 (three) times daily. 04/16/19   [place], Betty G, MD  pantoprazole (PROTONIX) 40 MG tablet Take 1 tablet (40 mg total) by mouth daily. 03/29/19   [place], Betty G, MD  venlafaxine XR (EFFEXOR XR) 75 MG 24 hr capsule Take 1 capsule (75 mg total) by mouth daily. Patient not taking: Reported on 05/18/2019 04/09/19   Laurey Morale, MD    Family History Family History  Problem Relation Age of Onset  . Anesthesia problems Mother   . Colon cancer Maternal Grandfather   . Crohn's disease Maternal Grandfather   . Gastric cancer Paternal Uncle   . Esophageal cancer Neg Hx     Social History Social History   Tobacco  Use  . Smoking status: Current Every Day Smoker    Packs/day: 1.00    Years: 22.00    Pack years: 22.00    Types: Cigarettes  . Smokeless tobacco: Never Used  Substance Use Topics  . Alcohol use: Yes    Comment: occ  . Drug use: No     Allergies   Compazine, Imitrex [sumatriptan base], Ketorolac tromethamine, Prochlorperazine, Risperidone, Sumatriptan, Risperidone and related, and Risperidone   Review of Systems Review of Systems  Constitutional: Negative for chills and fever.  HENT: Negative for congestion, ear pain and sore throat.   Respiratory: Negative for shortness of breath.   Cardiovascular: Negative for chest pain.  Gastrointestinal: Positive for nausea and vomiting. Negative for abdominal pain and diarrhea.  Genitourinary: Negative for dysuria.  Musculoskeletal: Positive for myalgias.  Neurological: Negative for syncope, weakness and numbness.  All other systems reviewed and are negative.    Physical Exam Updated Vital Signs  BP (!) 138/99 (BP Location: Right Arm)   Pulse (!) 102   Temp 99.5 F (37.5 C) (Oral)   Resp (!) 22   Ht 6\' 3"  (1.905 m)   Wt 131.1 kg   SpO2 98%   BMI 36.12 kg/m   Physical Exam Vitals signs and nursing note reviewed.  Constitutional:      General: He is in acute distress (mild, appears uncomfortable).     Appearance: He is well-developed. He is obese. He is not toxic-appearing.  HENT:     Head: Normocephalic and atraumatic.  Eyes:     General:        Right eye: No discharge.        Left eye: No discharge.     Conjunctiva/sclera: Conjunctivae normal.  Neck:     Musculoskeletal: Neck supple.  Cardiovascular:     Rate and Rhythm: Regular rhythm. Tachycardia present.     Pulses:          Radial pulses are 2+ on the right side and 2+ on the left side.       Posterior tibial pulses are 2+ on the right side and 2+ on the left side.  Pulmonary:     Effort: Pulmonary effort is normal. No respiratory distress.     Breath sounds:  Normal breath sounds. No wheezing, rhonchi or rales.  Abdominal:     General: There is no distension.     Palpations: Abdomen is soft.     Tenderness: There is no abdominal tenderness.  Musculoskeletal:     Comments: Moving all extremities.  No point/focal bony tenderness.  Mild generalized tenderness to extremities & back.  Compartments are soft throughout  Skin:    General: Skin is warm and dry.     Findings: No rash.  Neurological:     Mental Status: He is alert.     Comments: Clear speech. 5/5 symmetric grip strength/ 5/5 strength with plantar/dorsiflexion bilaterally. Sensation intact x 4. Ambulatory.   Psychiatric:        Behavior: Behavior is agitated.    ED Treatments / Results  Labs (all labs ordered are listed, but only abnormal results are displayed) Labs Reviewed - No data to display  EKG None  Radiology No results found.  Procedures Procedures (including critical care time)  Medications Ordered in ED Medications - No data to display   Initial Impression / Assessment and Plan / ED Course  I have reviewed the triage vital signs and the nursing notes.  Pertinent labs & imaging results that were available during my care of the patient were reviewed by me and considered in my medical decision making (see chart for details).   Patient presents to the ED w/ complaints of generalized muscle cramping. He is nontoxic appearing, appears uncomfortable, & is somewhat agitated. He is mildly tachycardic & hypertensive. On exam he is moving all extremities, compartments are soft- do not suspect compartment syndrome, & is NVI distal to all areas of pain. DDX: rhabdomyolysis, electrolyte derangement, dehydration, muscle spasm. Will provide morphine & zofran w/ fluids for sx control.   UA: No UTI. No significant dehydration per specific gravity. No hematuria.  CBC: Leukocytosis @ 10.9 felt to be nonspecific at this time. No anemia.  CMP: Mild hypocalcemia @ 8.7- no  significant electrolyte derangement. Renal function & LFTs preserved.  Mg:  WNL CK: WNL  21:48: RE-EVAL: Patient feeling a bit better but remains uncomfortable. Will trial oral valium.   22:11: Patient care signed  out to [place] PA-C at change of shift pending re-evaluation & disposition. Suspect likely discharge home with robaxin for symptomatic control.   Final Clinical Impressions(s) / ED Diagnoses   Final diagnoses:  Muscle cramping    ED Discharge Orders         Ordered    methocarbamol (ROBAXIN) 500 MG tablet  Every 8 hours PRN     05/31/19 2212           Amaryllis Dyke, PA-C 05/31/19 2212    Jola Schmidt, MD 05/31/19 [phone]

## 2019-05-31 NOTE — ED Triage Notes (Signed)
Pt complains of his back and legs cramping for about 2 hours, he states he was outside all day, he drank lots of water and tried pickle juice and mustard packets

## 2019-06-01 LAB — URINE CULTURE: Culture: NO GROWTH

## 2019-06-03 ENCOUNTER — Encounter (HOSPITAL_COMMUNITY): Payer: Self-pay

## 2019-06-03 ENCOUNTER — Emergency Department (HOSPITAL_COMMUNITY)
Admission: EM | Admit: 2019-06-03 | Discharge: 2019-06-03 | Discharge status: Against Medical Advice | Payer: Medicare HMO | Attending: Emergency Medicine | Admitting: Emergency Medicine

## 2019-06-03 DIAGNOSIS — Z5321 Procedure and treatment not carried out due to patient leaving prior to being seen by health care provider: Secondary | ICD-10-CM | POA: Insufficient documentation

## 2019-06-03 DIAGNOSIS — M545 Low back pain: Secondary | ICD-10-CM | POA: Diagnosis present

## 2019-06-03 NOTE — ED Notes (Signed)
Unable to locate pt in the lobby, outside, or at the vending machines

## 2019-06-03 NOTE — ED Notes (Signed)
Unable to locate x 1  

## 2019-06-03 NOTE — ED Triage Notes (Signed)
Pt comes via [facility] with lower back pain and bilateral leg pain that has been going on for several days, seen at [facility] for the same. Pt pacing in triage

## 2019-06-05 ENCOUNTER — Other Ambulatory Visit: Payer: Self-pay

## 2019-06-05 ENCOUNTER — Encounter: Payer: Self-pay | Admitting: Physician Assistant

## 2019-06-05 ENCOUNTER — Ambulatory Visit
Admission: EM | Admit: 2019-06-05 | Discharge: 2019-06-05 | Disposition: A | Discharge status: Home / Self Care | Payer: Medicare HMO | Attending: Physician Assistant | Admitting: Physician Assistant

## 2019-06-05 DIAGNOSIS — M5442 Lumbago with sciatica, left side: Secondary | ICD-10-CM | POA: Diagnosis not present

## 2019-06-05 DIAGNOSIS — M549 Dorsalgia, unspecified: Secondary | ICD-10-CM

## 2019-06-05 MED ORDER — PREDNISONE 10 MG (21) PO TBPK: ORAL_TABLET | Freq: Every day | ORAL | 0 refills | Status: DC

## 2019-06-05 MED ORDER — BACLOFEN 5 MG PO TABS: 5.0000 mg | ORAL_TABLET | Freq: Every evening | ORAL | 0 refills | Status: DC | PRN

## 2019-06-05 MED ORDER — METHYLPREDNISOLONE SODIUM SUCC 125 MG IJ SOLR
80.0000 mg | Freq: Once | INTRAMUSCULAR | Status: AC
  Administered 2019-06-05: 80 mg via INTRAMUSCULAR

## 2019-06-05 NOTE — ED Provider Notes (Signed)
[facility]    CSN: [id number] Arrival date & time: 06/05/19  1103      History   Chief Complaint Chief Complaint  Patient presents with  . Back Pain    HPI [name] is a 43 y.o. male.   42 year old male comes in for low back pain.  States he has degenerative disc disease, and has back pain at baseline.  Woke up this morning with severe low back pain and came in for evaluation.  Denies injury/trauma.  States pain to the midline back, and can radiate down the left leg.  Denies saddle anesthesia, loss of bladder or bowel control.  Denies urinary symptoms such as frequency, dysuria, hematuria.  Has taken ibuprofen and Tylenol without relief.   Patient was seen at the ED 05/31/2019 for muscle cramps, was given Rx of robaxin. Patient states "I didn't even know they called something in".      Past Medical History:  Diagnosis Date  . Anxiety   . Asthma   . Bipolar 1 disorder ([place])   . Chronic back pain   . Complication of anesthesia    Anxiety when he awaken with ET tube still in place  . GERD (gastroesophageal reflux disease)   . Hyperlipidemia   . Hypoventilation syndrome   . Left testicular torsion    "20 years ago"  . Lumbar radiculopathy   . Migraines   . Schizoaffective disorder ([place])    [place]    Patient Active Problem List   Diagnosis Date Noted  . Chronic pain disorder 05/04/2019  . Reactive airway disease that is not asthma 05/04/2019  . Asthma 04/29/2019  . Auditory hallucinations 01/20/2019  . Rectal bleeding 04/14/2018  . Dysphagia 12/24/2017  . Schizophrenia ([place]) 05/28/2016  . Abdominal pain, epigastric 02/04/2013  . Constipation 12/02/2012  . Abdominal pain 11/22/2012  . INSOMNIA 04/26/2009  . BACK PAIN, LUMBAR 03/20/2009  . BIPOLAR AFFECTIVE DISORDER 02/17/2009  . OTHER ENTHESOPATHY OF ANKLE AND TARSUS 10/05/2008  . GERD 09/16/2008  . PERSONAL HISTORY OF SCHIZOPHRENIA 05/27/2008  . ESOPHAGITIS, REFLUX 03/23/2008  .  HIATAL HERNIA 03/23/2008  . Hyperlipidemia, mixed 01/07/2008  . MORBID OBESITY 01/07/2008  . ANXIETY DEPRESSION 01/07/2008  . TOBACCO ABUSE 01/07/2008  . HYPERTENSION 01/07/2008  . ALLERGIC RHINITIS 01/07/2008  . IBS 01/07/2008    Past Surgical History:  Procedure Laterality Date  . ANKLE SURGERY    . APPENDECTOMY    . BIOPSY  01/15/2018   Procedure: BIOPSY;  Surgeon: Daneil Dolin, MD;  [facility];  Service: Endoscopy;;  gastric  . CHOLECYSTECTOMY    . ESOPHAGOGASTRODUODENOSCOPY  03/17/2008   RMR: A tiny distal esophageal erosions consistent with mild  erosive reflux esophagitis, otherwise normal esophagus, small hiatal hernia, minimally polypoid antral mucosa with doubtful clinical was significance.  Otherwise, normal stomach, patent pylorus, and normal D1-D2  . ESOPHAGOGASTRODUODENOSCOPY (EGD) WITH PROPOFOL N/A 02/18/2013   Procedure: ESOPHAGOGASTRODUODENOSCOPY (EGD) WITH PROPOFOL;  Surgeon: Daneil Dolin, MD;  Location: [facility];  Service: Endoscopy;  Laterality: N/A;  . ESOPHAGOGASTRODUODENOSCOPY (EGD) WITH PROPOFOL N/A 01/15/2018   Procedure: ESOPHAGOGASTRODUODENOSCOPY (EGD) WITH PROPOFOL;  Surgeon: Daneil Dolin, MD;  [facility];  Service: Endoscopy;  Laterality: N/A;  2:15pm  . MALONEY DILATION N/A 01/15/2018   Procedure: Venia Minks DILATION;  Surgeon: Daneil Dolin, MD;  [facility];  Service: Endoscopy;  Laterality: N/A;  . NASAL SINUS SURGERY  01/24/2012   Procedure: ENDOSCOPIC SINUS SURGERY;  Surgeon: Izora Gala, MD;  [facility] OR;  Service: ENT;  Laterality: Left;  left ethmoidectomy, maxillary, frontal  . UMBILICAL HERNIA REPAIR         Home Medications    Prior to Admission medications   Medication Sig Start Date End Date Taking? Authorizing Provider  albuterol (PROVENTIL) (2.5 MG/3ML) 0.083% nebulizer solution Take 3 mLs (2.5 mg total) by nebulization every 4 (four) hours as needed for wheezing or shortness of breath. 04/29/19    Laurey Morale, MD  albuterol (VENTOLIN HFA) 108 (90 Base) MCG/ACT inhaler INHALE 2 PUFFS INTO THE LUNGS EVERY 6 HOURS AS NEEDED FOR WHEEZING OR SHORTNESS OF BREATH Patient taking differently: Inhale 2 puffs into the lungs every 6 (six) hours as needed for wheezing or shortness of breath.  04/27/19   [place], Betty G, MD  ALPRAZolam Duanne Moron) 1 MG tablet Take 1 tablet (1 mg total) by mouth 2 (two) times daily as needed for anxiety. 05/06/19   [place], Betty G, MD  ARIPiprazole (ABILIFY) 20 MG tablet Take 1 tablet (20 mg total) by mouth 2 (two) times daily. Patient not taking: Reported on 05/31/2019 04/09/19   Laurey Morale, MD  baclofen 5 MG TABS Take 5 mg by mouth at bedtime as needed for muscle spasms. 06/05/19   Tasia Catchings, Jenicka Coxe V, PA-C  Fluticasone-Salmeterol (ADVAIR DISKUS) 250-50 MCG/DOSE AEPB Inhale 1 puff into the lungs 2 (two) times daily. Patient not taking: Reported on 05/31/2019 05/04/19   [place], Betty G, MD  gabapentin (NEURONTIN) 300 MG capsule Take 1 capsule (300 mg total) by mouth 3 (three) times daily. Patient not taking: Reported on 05/31/2019 04/16/19   [place], Betty G, MD  pantoprazole (PROTONIX) 40 MG tablet Take 1 tablet (40 mg total) by mouth daily. 03/29/19   [place], Betty G, MD  predniSONE (STERAPRED UNI-PAK 21 TAB) 10 MG (21) TBPK tablet Take by mouth daily. Take 6 tabs by mouth day 1, then 5 tabs, then 4 tabs, then 3 tabs, 2 tabs, then 1 tab for the last day 06/05/19   Ok Edwards, PA-C  venlafaxine XR (EFFEXOR XR) 75 MG 24 hr capsule Take 1 capsule (75 mg total) by mouth daily. Patient not taking: Reported on 05/18/2019 04/09/19   Laurey Morale, MD    Family History Family History  Problem Relation Age of Onset  . Anesthesia problems Mother   . Colon cancer Maternal Grandfather   . Crohn's disease Maternal Grandfather   . Gastric cancer Paternal Uncle   . Esophageal cancer Neg Hx     Social History Social History   Tobacco Use  . Smoking status: Current Every Day Smoker    Packs/day:  1.00    Years: 22.00    Pack years: 22.00    Types: Cigarettes  . Smokeless tobacco: Never Used  Substance Use Topics  . Alcohol use: Yes    Comment: occ  . Drug use: No     Allergies   Compazine, Imitrex [sumatriptan base], Ketorolac tromethamine, Prochlorperazine, Risperidone, Sumatriptan, Risperidone and related, and Risperidone   Review of Systems Review of Systems  Reason unable to perform ROS: See HPI as above.     Physical Exam Triage Vital Signs ED Triage Vitals [06/05/19 1112]  Enc Vitals Group     BP 122/85     Pulse Rate 91     Resp 16     Temp 98.7 F (37.1 C)     Temp Source Oral     SpO2 96 %  Weight      Height      Head Circumference      Peak Flow      Pain Score 10     Pain Loc      Pain Edu?      Excl. in [place]?    No data found.  Updated Vital Signs BP 122/85 (BP Location: Left Arm)   Pulse 91   Temp 98.7 F (37.1 C) (Oral)   Resp 16   SpO2 96%   Physical Exam Constitutional:      General: He is not in acute distress.    Appearance: He is well-developed. He is not diaphoretic.  HENT:     Head: Normocephalic and atraumatic.  Eyes:     Conjunctiva/sclera: Conjunctivae normal.     Pupils: Pupils are equal, round, and reactive to light.  Cardiovascular:     Rate and Rhythm: Normal rate and regular rhythm.     Heart sounds: Normal heart sounds. No murmur. No friction rub. No gallop.   Pulmonary:     Effort: Pulmonary effort is normal. No accessory muscle usage or respiratory distress.     Breath sounds: Normal breath sounds. No stridor. No decreased breath sounds, wheezing, rhonchi or rales.  Musculoskeletal:     Comments: Diffuse tenderness to light touch of lumbar spine and left paraspinal area. Full range of motion of back and hips. Sensation intact and equal bilaterally. Positive straight leg raise.   Patient able to ambulate on own without difficulty. Walking without obvious pain.   Skin:    General: Skin is warm and dry.   Neurological:     Mental Status: He is alert and oriented to person, place, and time.      UC Treatments / Results  Labs (all labs ordered are listed, but only abnormal results are displayed) Labs Reviewed - No data to display  EKG   Radiology No results found.  Procedures Procedures (including critical care time)  Medications Ordered in UC Medications  methylPREDNISolone sodium succinate (SOLU-MEDROL) 125 mg/2 mL injection 80 mg (80 mg Intramuscular Given 06/05/19 1136)    Initial Impression / Assessment and Plan / UC Course  I have reviewed the triage vital signs and the nursing notes.  Pertinent labs & imaging results that were available during my care of the patient were reviewed by me and considered in my medical decision making (see chart for details).    Will provide prednisone for symptoms. Baclofen as needed. Patient has appointment scheduled to follow up with pain management, will have patient follow up as scheduled for further evaluation. Return precautions given.  Final Clinical Impressions(s) / UC Diagnoses   Final diagnoses:  Acute left-sided low back pain with left-sided sciatica   ED Prescriptions    Medication Sig Dispense Auth. Provider   predniSONE (STERAPRED UNI-PAK 21 TAB) 10 MG (21) TBPK tablet Take by mouth daily. Take 6 tabs by mouth day 1, then 5 tabs, then 4 tabs, then 3 tabs, 2 tabs, then 1 tab for the last day 21 tablet Sidney Kann V, PA-C   baclofen 5 MG TABS Take 5 mg by mouth at bedtime as needed for muscle spasms. 15 tablet Tobin Chad, [place] 06/05/19 1207

## 2019-06-05 NOTE — ED Triage Notes (Signed)
Pt has degenerative disk disease and has been dealing with lower back for for some time and this morning woke up with severs lower back pain.

## 2019-06-05 NOTE — Discharge Instructions (Signed)
Solumedrol injection in office today. Start prednisone as directed. Baclofen as needed at night, this can make you drowsy, so do not take if you are going to drive, operate heavy machinery, or make important decisions. Ice/heat compresses as needed. This can take up to 3-4 weeks to completely resolve, but you should be feeling better each week. Follow up with PCP if symptoms worsen, changes for reevaluation. If experience numbness/tingling of the inner thighs, loss of bladder or bowel control, go to the emergency department for evaluation.

## 2019-06-21 ENCOUNTER — Other Ambulatory Visit: Payer: Self-pay

## 2019-06-21 ENCOUNTER — Encounter (HOSPITAL_COMMUNITY): Payer: Self-pay | Admitting: Emergency Medicine

## 2019-06-21 ENCOUNTER — Emergency Department (HOSPITAL_COMMUNITY)
Admission: EM | Admit: 2019-06-21 | Discharge: 2019-06-21 | Disposition: A | Discharge status: Home / Self Care | Payer: Medicare HMO | Attending: Emergency Medicine | Admitting: Emergency Medicine

## 2019-06-21 DIAGNOSIS — R112 Nausea with vomiting, unspecified: Secondary | ICD-10-CM

## 2019-06-21 DIAGNOSIS — E785 Hyperlipidemia, unspecified: Secondary | ICD-10-CM | POA: Diagnosis not present

## 2019-06-21 DIAGNOSIS — F17210 Nicotine dependence, cigarettes, uncomplicated: Secondary | ICD-10-CM | POA: Insufficient documentation

## 2019-06-21 DIAGNOSIS — R1010 Upper abdominal pain, unspecified: Secondary | ICD-10-CM

## 2019-06-21 DIAGNOSIS — R197 Diarrhea, unspecified: Secondary | ICD-10-CM | POA: Diagnosis not present

## 2019-06-21 DIAGNOSIS — I10 Essential (primary) hypertension: Secondary | ICD-10-CM | POA: Diagnosis not present

## 2019-06-21 LAB — CBC
HCT: 50.8 % (ref 39.0–52.0)
Hemoglobin: 17.4 g/dL — ABNORMAL HIGH (ref 13.0–17.0)
MCH: 30.9 pg (ref 26.0–34.0)
MCHC: 34.3 g/dL (ref 30.0–36.0)
MCV: 90.1 fL (ref 80.0–100.0)
Platelets: 229 10*3/uL (ref 150–400)
RBC: 5.64 MIL/uL (ref 4.22–5.81)
RDW: 13.6 % (ref 11.5–15.5)
WBC: 8.1 10*3/uL (ref 4.0–10.5)
nRBC: 0 % (ref 0.0–0.2)

## 2019-06-21 LAB — COMPREHENSIVE METABOLIC PANEL
ALT: 29 U/L (ref 0–44)
AST: 32 U/L (ref 15–41)
Albumin: 3.9 g/dL (ref 3.5–5.0)
Alkaline Phosphatase: 79 U/L (ref 38–126)
Anion gap: 11 (ref 5–15)
BUN: 7 mg/dL (ref 6–20)
CO2: 21 mmol/L — ABNORMAL LOW (ref 22–32)
Calcium: 9.2 mg/dL (ref 8.9–10.3)
Chloride: 105 mmol/L (ref 98–111)
Creatinine, Ser: 0.87 mg/dL (ref 0.61–1.24)
GFR calc Af Amer: >60 mL/min (ref >60)
GFR calc non Af Amer: >60 mL/min (ref >60)
Glucose, Bld: 104 mg/dL — ABNORMAL HIGH (ref 70–99)
Potassium: 4.2 mmol/L (ref 3.5–5.1)
Sodium: 137 mmol/L (ref 135–145)
Total Bilirubin: 0.8 mg/dL (ref 0.3–1.2)
Total Protein: 6.5 g/dL (ref 6.5–8.1)

## 2019-06-21 LAB — LIPASE, BLOOD: Lipase: 22 U/L (ref 11–51)

## 2019-06-21 MED ORDER — ONDANSETRON 4 MG PO TBDP
4.0000 mg | ORAL_TABLET | Freq: Once | ORAL | Status: AC | PRN
  Administered 2019-06-21: 4 mg via ORAL
  Filled 2019-06-21: qty 1

## 2019-06-21 MED ORDER — SODIUM CHLORIDE 0.9 % IV BOLUS
1000.0000 mL | Freq: Once | INTRAVENOUS | Status: AC
  Administered 2019-06-21: 13:00:00 1000 mL via INTRAVENOUS

## 2019-06-21 MED ORDER — SODIUM CHLORIDE 0.9% FLUSH: 3.0000 mL | Freq: Once | INTRAVENOUS | Status: DC

## 2019-06-21 MED ORDER — MORPHINE SULFATE (PF) 4 MG/ML IV SOLN
4.0000 mg | Freq: Once | INTRAVENOUS | Status: AC
  Administered 2019-06-21: 13:00:00 4 mg via INTRAVENOUS
  Filled 2019-06-21: qty 1

## 2019-06-21 MED ORDER — OXYCODONE-ACETAMINOPHEN 5-325 MG PO TABS
1.0000 | ORAL_TABLET | ORAL | Status: DC | PRN
  Administered 2019-06-21: 11:00:00 1 via ORAL
  Filled 2019-06-21: qty 1

## 2019-06-21 NOTE — ED Provider Notes (Signed)
[facility]ERG[facility] DEPARTMENT Provider Note   CSN: [id number] Arrival date & time: 06/21/19  1017    History   Chief Complaint Chief Complaint  Patient presents with   Abdominal Pain   Nausea   Emesis   Diarrhea    HPI [name] is a 43 y.o. male with PMHx anxiety, bipolar disorder, GERD, schizoaffective disorder who presents to the ED today complaining of sudden onset, constant, achy/sharp, diffuse upper abdominal pain that began this morning around 8 AM. Pt reports he woke up from the pain. Also complains of nausea, NBNB emesis, and diarrhea. He reports hx of pancreatitis in the past and states this feels similar. Pt reports he does not drink EtOH and that his pancreatitis last time was not from drinking. Denies fever, chills, chest pain, shortness of breath, hematochezia, melena, hematemesis, urinary sx, or any other associated symptoms. No recent abx use. No recent suspicious food intake - pt has a vegetarian pizza last night with various cheeses on it and spinach. No EtOH use or excessive NSAID use. PSHx includes cholecystectomy and appendectomy.        Past Medical History:  Diagnosis Date   Anxiety    Asthma    Bipolar 1 disorder ([place])    Chronic back pain    Complication of anesthesia    Anxiety when he awaken with ET tube still in place   GERD (gastroesophageal reflux disease)    Hyperlipidemia    Hypoventilation syndrome    Left testicular torsion    "20 years ago"   Lumbar radiculopathy    Migraines    Schizoaffective disorder ([place])    [place]    Patient Active Problem List   Diagnosis Date Noted   Chronic pain disorder 05/04/2019   Reactive airway disease that is not asthma 05/04/2019   Asthma 04/29/2019   Auditory hallucinations 01/20/2019   Rectal bleeding 04/14/2018   Dysphagia 12/24/2017   Schizophrenia ([place]) 05/28/2016   Abdominal pain, epigastric 02/04/2013   Constipation 12/02/2012    Abdominal pain 11/22/2012   INSOMNIA 04/26/2009   BACK PAIN, LUMBAR 03/20/2009   BIPOLAR AFFECTIVE DISORDER 02/17/2009   OTHER ENTHESOPATHY OF ANKLE AND TARSUS 10/05/2008   GERD 09/16/2008   PERSONAL HISTORY OF SCHIZOPHRENIA 05/27/2008   ESOPHAGITIS, REFLUX 03/23/2008   HIATAL HERNIA 03/23/2008   Hyperlipidemia, mixed 01/07/2008   MORBID OBESITY 01/07/2008   ANXIETY DEPRESSION 01/07/2008   TOBACCO ABUSE 01/07/2008   HYPERTENSION 01/07/2008   ALLERGIC RHINITIS 01/07/2008   IBS 01/07/2008    Past Surgical History:  Procedure Laterality Date   ANKLE SURGERY     APPENDECTOMY     BIOPSY  01/15/2018   Procedure: BIOPSY;  Surgeon: Daneil Dolin, MD;  [facility];  Service: Endoscopy;;  gastric   CHOLECYSTECTOMY     ESOPHAGOGASTRODUODENOSCOPY  03/17/2008   RMR: A tiny distal esophageal erosions consistent with mild  erosive reflux esophagitis, otherwise normal esophagus, small hiatal hernia, minimally polypoid antral mucosa with doubtful clinical was significance.  Otherwise, normal stomach, patent pylorus, and normal D1-D2   ESOPHAGOGASTRODUODENOSCOPY (EGD) WITH PROPOFOL N/A 02/18/2013   Procedure: ESOPHAGOGASTRODUODENOSCOPY (EGD) WITH PROPOFOL;  Surgeon: Daneil Dolin, MD;  Location: [facility];  Service: Endoscopy;  Laterality: N/A;   ESOPHAGOGASTRODUODENOSCOPY (EGD) WITH PROPOFOL N/A 01/15/2018   Procedure: ESOPHAGOGASTRODUODENOSCOPY (EGD) WITH PROPOFOL;  Surgeon: Daneil Dolin, MD;  [facility];  Service: Endoscopy;  Laterality: N/A;  2:15pm   MALONEY DILATION N/A 01/15/2018  Procedure: MALONEY DILATION;  Surgeon: Daneil Dolin, MD;  [facility];  Service: Endoscopy;  Laterality: N/A;   NASAL SINUS SURGERY  01/24/2012   Procedure: ENDOSCOPIC SINUS SURGERY;  Surgeon: Izora Gala, MD;  Location: [facility];  Service: ENT;  Laterality: Left;  left ethmoidectomy, maxillary, frontal   UMBILICAL HERNIA REPAIR          Home  Medications    Prior to Admission medications   Medication Sig Start Date End Date Taking? Authorizing Provider  albuterol (PROVENTIL) (2.5 MG/3ML) 0.083% nebulizer solution Take 3 mLs (2.5 mg total) by nebulization every 4 (four) hours as needed for wheezing or shortness of breath. 04/29/19   Laurey Morale, MD  albuterol (VENTOLIN HFA) 108 (90 Base) MCG/ACT inhaler INHALE 2 PUFFS INTO THE LUNGS EVERY 6 HOURS AS NEEDED FOR WHEEZING OR SHORTNESS OF BREATH Patient taking differently: Inhale 2 puffs into the lungs every 6 (six) hours as needed for wheezing or shortness of breath.  04/27/19   [place], Betty G, MD  ALPRAZolam Duanne Moron) 1 MG tablet Take 1 tablet (1 mg total) by mouth 2 (two) times daily as needed for anxiety. 05/06/19   [place], Betty G, MD  ARIPiprazole (ABILIFY) 20 MG tablet Take 1 tablet (20 mg total) by mouth 2 (two) times daily. Patient not taking: Reported on 05/31/2019 04/09/19   Laurey Morale, MD  baclofen 5 MG TABS Take 5 mg by mouth at bedtime as needed for muscle spasms. 06/05/19   Tasia Catchings, Amy V, PA-C  Fluticasone-Salmeterol (ADVAIR DISKUS) 250-50 MCG/DOSE AEPB Inhale 1 puff into the lungs 2 (two) times daily. Patient not taking: Reported on 05/31/2019 05/04/19   [place], Betty G, MD  gabapentin (NEURONTIN) 300 MG capsule Take 1 capsule (300 mg total) by mouth 3 (three) times daily. Patient not taking: Reported on 05/31/2019 04/16/19   [place], Betty G, MD  pantoprazole (PROTONIX) 40 MG tablet Take 1 tablet (40 mg total) by mouth daily. 03/29/19   [place], Betty G, MD  predniSONE (STERAPRED UNI-PAK 21 TAB) 10 MG (21) TBPK tablet Take by mouth daily. Take 6 tabs by mouth day 1, then 5 tabs, then 4 tabs, then 3 tabs, 2 tabs, then 1 tab for the last day 06/05/19   Ok Edwards, PA-C  venlafaxine XR (EFFEXOR XR) 75 MG 24 hr capsule Take 1 capsule (75 mg total) by mouth daily. Patient not taking: Reported on 05/18/2019 04/09/19   Laurey Morale, MD    Family History Family History  Problem Relation Age  of Onset   Anesthesia problems Mother    Colon cancer Maternal Grandfather    Crohn's disease Maternal Grandfather    Gastric cancer Paternal Uncle    Esophageal cancer Neg Hx     Social History Social History   Tobacco Use   Smoking status: Current Every Day Smoker    Packs/day: 1.00    Years: 22.00    Pack years: 22.00    Types: Cigarettes   Smokeless tobacco: Never Used  Substance Use Topics   Alcohol use: Yes    Comment: occ   Drug use: No     Allergies   Compazine, Imitrex [sumatriptan base], Ketorolac tromethamine, Prochlorperazine, Risperidone, Sumatriptan, Risperidone and related, and Risperidone   Review of Systems Review of Systems  Constitutional: Negative for chills and fever.  HENT: Negative for congestion.   Eyes: Negative for visual disturbance.  Respiratory: Negative for cough and shortness of breath.   Cardiovascular: Negative for chest  pain.  Gastrointestinal: Positive for abdominal pain, diarrhea, nausea and vomiting. Negative for blood in stool and constipation.  Genitourinary: Negative for difficulty urinating.  Musculoskeletal: Negative for myalgias.  Skin: Negative for rash.  Neurological: Negative for headaches.     Physical Exam Updated Vital Signs BP 101/69 (BP Location: Right Arm)    Pulse 97    Temp 99.3 F (37.4 C) (Oral)    Resp 18    Ht 6\' 3"  (1.905 m)    Wt 131.5 kg    SpO2 97%    BMI 36.25 kg/m   Physical Exam Vitals signs and nursing note reviewed.  Constitutional:      Appearance: He is obese. He is not ill-appearing.  HENT:     Head: Normocephalic and atraumatic.  Eyes:     Conjunctiva/sclera: Conjunctivae normal.  Neck:     Musculoskeletal: Neck supple.  Cardiovascular:     Rate and Rhythm: Normal rate and regular rhythm.  Pulmonary:     Effort: Pulmonary effort is normal.     Breath sounds: Normal breath sounds. No wheezing, rhonchi or rales.  Abdominal:     Palpations: Abdomen is soft.     Tenderness:  There is abdominal tenderness in the right upper quadrant, epigastric area and left upper quadrant. There is no guarding or rebound. Negative signs include Murphy's sign.  Skin:    General: Skin is warm and dry.  Neurological:     Mental Status: He is alert.      ED Treatments / Results  Labs (all labs ordered are listed, but only abnormal results are displayed) Labs Reviewed  COMPREHENSIVE METABOLIC PANEL - Abnormal; Notable for the following components:      Result Value   CO2 21 (*)    Glucose, Bld 104 (*)    All other components within normal limits  CBC - Abnormal; Notable for the following components:   Hemoglobin 17.4 (*)    All other components within normal limits  LIPASE, BLOOD  URINALYSIS, ROUTINE W REFLEX MICROSCOPIC    EKG None  Radiology No results found.  Procedures Procedures (including critical care time)  Medications Ordered in ED Medications  sodium chloride flush (NS) 0.9 % injection 3 mL (3 mLs Intravenous Not Given 06/21/19 1220)  ondansetron (ZOFRAN-ODT) disintegrating tablet 4 mg (4 mg Oral Given 06/21/19 1057)  sodium chloride 0.9 % bolus 1,000 mL (1,000 mLs Intravenous New Bag/Given 06/21/19 1250)  morphine 4 MG/ML injection 4 mg (4 mg Intravenous Given 06/21/19 1249)     Initial Impression / Assessment and Plan / ED Course  I have reviewed the triage vital signs and the nursing notes.  Pertinent labs & imaging results that were available during my care of the patient were reviewed by me and considered in my medical decision making (see chart for details).    43 year old male presenting to the ED today with complaints of diffuse upper abdominal pain to RUQ, LUQ, and epigastrium with N/V/D that began this AM. Reports hx of pancreatitis and this feels similar. Pt denies EtOH usage. He appears uncomfortable in room and has tenderness diffusely. PSHx includes cholecystectomy and appendectomy. DDx includes pancreatitis, gastritis, gastroenteritis,  PUD. Pt has hx of multiple EGDs in the past; reports hx of ulcers. Was scheduled to have an EGD done last week but missed his appointment. Pt afebrile in the ED without tachycardia or tachypnea. He is otherwise hemodynamically stable. Baseline bloodwork obtained including CBC, CMP, lipase, and U/A. Do not feel  patient needs imaging at this time given he is without gallbladder and appendix. Will reevaluate once labs return. IVFs, pain medication, and antiemetics given. Will reevaluate once labs return.  CBC without leukocytosis. Hgb stable. No electrolyte abnormalities on CMP. No elevations in LFTs. Lipase within normal limits. Do not feel patient needs imaging at this time given completely normal blood work. Upon reevaluation patient requesting phenergan. He has not vomited while in the ED after being given Zofran. Would like to hold off for now and attempt fluid challenge. Pt reports he is hungry and asking if his boyfriend can bring him something to eat - will oblige at this time and reevaluate.   1:23 PM Nursing staff informed that pt is requesting to leave AMA. He spoke with his PCP in [facility] who have rescheduled him for his EGD today. He is leaving to go have it done. Have not collected U/A but do not feel this would change pt's course of treatment very much. He is stable to leave at this time. Strict return precautions discussed. Pt is in agreement with plan.        Final Clinical Impressions(s) / ED Diagnoses   Final diagnoses:  Nausea vomiting and diarrhea  Pain of upper abdomen    ED Discharge Orders    None       Eustaquio Maize, PA-C 06/21/19 2102    Carmin Muskrat, MD 06/24/19 [phone]

## 2019-06-21 NOTE — ED Notes (Signed)
Pt wanting to leave AMA. Pt states he is going to see another doctor somewhere else.

## 2019-06-21 NOTE — ED Triage Notes (Signed)
Onset today general abdominal pain with nausea,emesis, and diarrhea. History of pancreatitis.

## 2019-07-03 ENCOUNTER — Other Ambulatory Visit: Payer: Self-pay

## 2019-07-03 ENCOUNTER — Encounter (HOSPITAL_COMMUNITY): Payer: Self-pay

## 2019-07-03 DIAGNOSIS — Y999 Unspecified external cause status: Secondary | ICD-10-CM | POA: Insufficient documentation

## 2019-07-03 DIAGNOSIS — W458XXA Other foreign body or object entering through skin, initial encounter: Secondary | ICD-10-CM | POA: Diagnosis not present

## 2019-07-03 DIAGNOSIS — Y990 Civilian activity done for income or pay: Secondary | ICD-10-CM | POA: Insufficient documentation

## 2019-07-03 DIAGNOSIS — S59912A Unspecified injury of left forearm, initial encounter: Secondary | ICD-10-CM | POA: Insufficient documentation

## 2019-07-03 DIAGNOSIS — Z5321 Procedure and treatment not carried out due to patient leaving prior to being seen by health care provider: Secondary | ICD-10-CM | POA: Insufficient documentation

## 2019-07-03 DIAGNOSIS — Y929 Unspecified place or not applicable: Secondary | ICD-10-CM | POA: Insufficient documentation

## 2019-07-03 DIAGNOSIS — Y939 Activity, unspecified: Secondary | ICD-10-CM | POA: Insufficient documentation

## 2019-07-03 NOTE — ED Notes (Signed)
Pt states that if he is discharged by 1a, his job will send a cab.

## 2019-07-03 NOTE — ED Triage Notes (Signed)
Pt bib PTAR and presents with a laceration to the left lower arm.  Pt states he was at work when he was pulling open a refrigerator door that had a piece of metal on it.  The metal cut his arm.  PTAR reports that the skin has been pulled back but no bleeding at this. Pt a/o x 4 and ambulatory.

## 2019-07-04 ENCOUNTER — Emergency Department (HOSPITAL_COMMUNITY)
Admission: EM | Admit: 2019-07-04 | Discharge: 2019-07-04 | Disposition: A | Discharge status: Home / Self Care | Payer: Worker's Compensation | Attending: Emergency Medicine | Admitting: Emergency Medicine

## 2019-07-05 ENCOUNTER — Encounter (HOSPITAL_COMMUNITY): Payer: Self-pay | Admitting: Emergency Medicine

## 2019-07-05 ENCOUNTER — Other Ambulatory Visit: Payer: Self-pay

## 2019-07-05 ENCOUNTER — Emergency Department (HOSPITAL_COMMUNITY)
Admission: EM | Admit: 2019-07-05 | Discharge: 2019-07-05 | Disposition: A | Discharge status: Home / Self Care | Payer: Worker's Compensation | Attending: Emergency Medicine | Admitting: Emergency Medicine

## 2019-07-05 DIAGNOSIS — Y9389 Activity, other specified: Secondary | ICD-10-CM | POA: Diagnosis not present

## 2019-07-05 DIAGNOSIS — Y990 Civilian activity done for income or pay: Secondary | ICD-10-CM | POA: Insufficient documentation

## 2019-07-05 DIAGNOSIS — F250 Schizoaffective disorder, bipolar type: Secondary | ICD-10-CM | POA: Insufficient documentation

## 2019-07-05 DIAGNOSIS — I10 Essential (primary) hypertension: Secondary | ICD-10-CM | POA: Insufficient documentation

## 2019-07-05 DIAGNOSIS — F17210 Nicotine dependence, cigarettes, uncomplicated: Secondary | ICD-10-CM | POA: Insufficient documentation

## 2019-07-05 DIAGNOSIS — G894 Chronic pain syndrome: Secondary | ICD-10-CM | POA: Diagnosis not present

## 2019-07-05 DIAGNOSIS — Y9289 Other specified places as the place of occurrence of the external cause: Secondary | ICD-10-CM | POA: Insufficient documentation

## 2019-07-05 DIAGNOSIS — W268XXA Contact with other sharp object(s), not elsewhere classified, initial encounter: Secondary | ICD-10-CM | POA: Insufficient documentation

## 2019-07-05 DIAGNOSIS — L03114 Cellulitis of left upper limb: Secondary | ICD-10-CM | POA: Diagnosis not present

## 2019-07-05 DIAGNOSIS — S51812A Laceration without foreign body of left forearm, initial encounter: Secondary | ICD-10-CM | POA: Insufficient documentation

## 2019-07-05 MED ORDER — OXYCODONE-ACETAMINOPHEN 5-325 MG PO TABS: 1.0000 | ORAL_TABLET | ORAL | Status: DC | PRN

## 2019-07-05 MED ORDER — DOXYCYCLINE HYCLATE 100 MG PO CAPS: 100.0000 mg | ORAL_CAPSULE | Freq: Two times a day (BID) | ORAL | 0 refills | Status: DC

## 2019-07-05 MED ORDER — DOXYCYCLINE HYCLATE 100 MG PO TABS
100.0000 mg | ORAL_TABLET | Freq: Once | ORAL | Status: AC
  Administered 2019-07-05: 100 mg via ORAL
  Filled 2019-07-05: qty 1

## 2019-07-05 NOTE — Discharge Instructions (Signed)
Please read attached information. If you experience any new or worsening signs or symptoms please return to the emergency room for evaluation. Please follow-up with your primary care provider or specialist as discussed. Please use medication prescribed only as directed and discontinue taking if you have any concerning signs or symptoms.   °

## 2019-07-05 NOTE — ED Triage Notes (Signed)
Pt reports getting cut against a sharp metal freezer door edge at work. Pt reports he was seen at [facility] two days ago for same thing. Wound about 3 inches long, swollen, inflamed. Pt reports wound has green/brown drainage with odor. Pain goes from injury up the left forearm.

## 2019-07-05 NOTE — ED Provider Notes (Signed)
[facility] EMERGENCY DEPARTMENT Provider Note   CSN: [id number] Arrival date & time: 07/05/19  [id number]     History   Chief Complaint Chief Complaint  Patient presents with  . Wound Infection    HPI [name] is a 43 y.o. male.     HPI   43 year old male presents today with complaints of wound to his left forearm.  Patient notes that 2 days ago he cut his left forearm while at work.  He notes this was a piece of metal that caused the injury.  He notes he went to [place] long but did not have any care.  He reports some surrounding redness and pain at the site.  He denies any difficulty ranging his hand or fingers.  No fever.  He notes a history of cellulitis previously.  He notes taking Tylenol and ibuprofen.  Past Medical History:  Diagnosis Date  . Anxiety   . Asthma   . Bipolar 1 disorder ([place])   . Chronic back pain   . Complication of anesthesia    Anxiety when he awaken with ET tube still in place  . GERD (gastroesophageal reflux disease)   . Hyperlipidemia   . Hypoventilation syndrome   . Left testicular torsion    "20 years ago"  . Lumbar radiculopathy   . Migraines   . Schizoaffective disorder ([place])    [place]    Patient Active Problem List   Diagnosis Date Noted  . Chronic pain disorder 05/04/2019  . Reactive airway disease that is not asthma 05/04/2019  . Asthma 04/29/2019  . Auditory hallucinations 01/20/2019  . Rectal bleeding 04/14/2018  . Dysphagia 12/24/2017  . Schizophrenia ([place]) 05/28/2016  . Abdominal pain, epigastric 02/04/2013  . Constipation 12/02/2012  . Abdominal pain 11/22/2012  . INSOMNIA 04/26/2009  . BACK PAIN, LUMBAR 03/20/2009  . BIPOLAR AFFECTIVE DISORDER 02/17/2009  . OTHER ENTHESOPATHY OF ANKLE AND TARSUS 10/05/2008  . GERD 09/16/2008  . PERSONAL HISTORY OF SCHIZOPHRENIA 05/27/2008  . ESOPHAGITIS, REFLUX 03/23/2008  . HIATAL HERNIA 03/23/2008  . Hyperlipidemia, mixed 01/07/2008  . MORBID OBESITY  01/07/2008  . ANXIETY DEPRESSION 01/07/2008  . TOBACCO ABUSE 01/07/2008  . HYPERTENSION 01/07/2008  . ALLERGIC RHINITIS 01/07/2008  . IBS 01/07/2008    Past Surgical History:  Procedure Laterality Date  . ANKLE SURGERY    . APPENDECTOMY    . BIOPSY  01/15/2018   Procedure: BIOPSY;  Surgeon: Daneil Dolin, MD;  [facility];  Service: Endoscopy;;  gastric  . CHOLECYSTECTOMY    . ESOPHAGOGASTRODUODENOSCOPY  03/17/2008   RMR: A tiny distal esophageal erosions consistent with mild  erosive reflux esophagitis, otherwise normal esophagus, small hiatal hernia, minimally polypoid antral mucosa with doubtful clinical was significance.  Otherwise, normal stomach, patent pylorus, and normal D1-D2  . ESOPHAGOGASTRODUODENOSCOPY (EGD) WITH PROPOFOL N/A 02/18/2013   Procedure: ESOPHAGOGASTRODUODENOSCOPY (EGD) WITH PROPOFOL;  Surgeon: Daneil Dolin, MD;  Location[facility];  Service: Endoscopy;  Laterality: N/A;  . ESOPHAGOGASTRODUODENOSCOPY (EGD) WITH PROPOFOL N/A 01/15/2018   Procedure: ESOPHAGOGASTRODUODENOSCOPY (EGD) WITH PROPOFOL;  Surgeon: Daneil Dolin, MD;  [facility];  Service: Endoscopy;  Laterality: N/A;  2:15pm  . MALONEY DILATION N/A 01/15/2018   Procedure: Venia Minks DILATION;  Surgeon: Daneil Dolin, MD;  [facility];  Service: Endoscopy;  Laterality: N/A;  . NASAL SINUS SURGERY  01/24/2012   Procedure: ENDOSCOPIC SINUS SURGERY;  Surgeon: Izora Gala, MD;  Location: [facility];  Service:  ENT;  Laterality: Left;  left ethmoidectomy, maxillary, frontal  . UMBILICAL HERNIA REPAIR          Home Medications    Prior to Admission medications   Medication Sig Start Date End Date Taking? Authorizing Provider  albuterol (PROVENTIL) (2.5 MG/3ML) 0.083% nebulizer solution Take 3 mLs (2.5 mg total) by nebulization every 4 (four) hours as needed for wheezing or shortness of breath. 04/29/19   Laurey Morale, MD  albuterol (VENTOLIN HFA) 108 (90 Base) MCG/ACT inhaler  INHALE 2 PUFFS INTO THE LUNGS EVERY 6 HOURS AS NEEDED FOR WHEEZING OR SHORTNESS OF BREATH Patient taking differently: Inhale 2 puffs into the lungs every 6 (six) hours as needed for wheezing or shortness of breath.  04/27/19   [place], Betty G, MD  ALPRAZolam Duanne Moron) 1 MG tablet Take 1 tablet (1 mg total) by mouth 2 (two) times daily as needed for anxiety. 05/06/19   [place], Betty G, MD  ARIPiprazole (ABILIFY) 20 MG tablet Take 1 tablet (20 mg total) by mouth 2 (two) times daily. Patient not taking: Reported on 05/31/2019 04/09/19   Laurey Morale, MD  baclofen 5 MG TABS Take 5 mg by mouth at bedtime as needed for muscle spasms. 06/05/19   Tasia Catchings, Amy V, PA-C  Fluticasone-Salmeterol (ADVAIR DISKUS) 250-50 MCG/DOSE AEPB Inhale 1 puff into the lungs 2 (two) times daily. Patient not taking: Reported on 05/31/2019 05/04/19   [place], Betty G, MD  gabapentin (NEURONTIN) 300 MG capsule Take 1 capsule (300 mg total) by mouth 3 (three) times daily. Patient not taking: Reported on 05/31/2019 04/16/19   [place], Betty G, MD  pantoprazole (PROTONIX) 40 MG tablet Take 1 tablet (40 mg total) by mouth daily. 03/29/19   [place], Betty G, MD  predniSONE (STERAPRED UNI-PAK 21 TAB) 10 MG (21) TBPK tablet Take by mouth daily. Take 6 tabs by mouth day 1, then 5 tabs, then 4 tabs, then 3 tabs, 2 tabs, then 1 tab for the last day 06/05/19   Ok Edwards, PA-C  venlafaxine XR (EFFEXOR XR) 75 MG 24 hr capsule Take 1 capsule (75 mg total) by mouth daily. Patient not taking: Reported on 05/18/2019 04/09/19   Laurey Morale, MD    Family History Family History  Problem Relation Age of Onset  . Anesthesia problems Mother   . Colon cancer Maternal Grandfather   . Crohn's disease Maternal Grandfather   . Gastric cancer Paternal Uncle   . Esophageal cancer Neg Hx     Social History Social History   Tobacco Use  . Smoking status: Current Every Day Smoker    Packs/day: 1.00    Years: 22.00    Pack years: 22.00    Types: Cigarettes  .  Smokeless tobacco: Never Used  Substance Use Topics  . Alcohol use: Yes    Comment: occ  . Drug use: No     Allergies   Compazine, Imitrex [sumatriptan base], Ketorolac tromethamine, Prochlorperazine, Risperidone, Sumatriptan, Risperidone and related, and Risperidone   Review of Systems Review of Systems  All other systems reviewed and are negative.    Physical Exam Updated Vital Signs BP (!) 119/100 (BP Location: Right Arm)   Pulse 82   Temp 98.9 F (37.2 C) (Oral)   Resp 18   Ht 6\' 3"  (1.905 m)   Wt 131.5 kg   SpO2 94%   BMI 36.25 kg/m   Physical Exam Vitals signs and nursing note reviewed.  Constitutional:      Appearance:  He is well-developed.  HENT:     Head: Normocephalic and atraumatic.  Eyes:     General: No scleral icterus.       Right eye: No discharge.        Left eye: No discharge.     Conjunctiva/sclera: Conjunctivae normal.     Pupils: Pupils are equal, round, and reactive to light.  Neck:     Musculoskeletal: Normal range of motion.     Vascular: No JVD.     Trachea: No tracheal deviation.  Pulmonary:     Effort: Pulmonary effort is normal.     Breath sounds: No stridor.  Musculoskeletal:     Comments: 3 cm laceration to the left forearm with minimal surrounding erythema, no purulence, no decreased range of motion at the wrist or hands, no induration or discharge  Neurological:     Mental Status: He is alert and oriented to person, place, and time.     Coordination: Coordination normal.  Psychiatric:        Behavior: Behavior normal.        Thought Content: Thought content normal.        Judgment: Judgment normal.      ED Treatments / Results  Labs (all labs ordered are listed, but only abnormal results are displayed) Labs Reviewed - No data to display  EKG None  Radiology No results found.  Procedures Procedures (including critical care time)  Medications Ordered in ED Medications  doxycycline (VIBRA-TABS) tablet 100 mg  (has no administration in time range)     Initial Impression / Assessment and Plan / ED Course  I have reviewed the triage vital signs and the nursing notes.  Pertinent labs & imaging results that were available during my care of the patient were reviewed by me and considered in my medical decision making (see chart for details).        Labs:   Imaging:  Consults:  Therapeutics:  Discharge Meds:   Assessment/Plan: 43 year old male presents today with cellulitis secondary to laceration.  This is very minimal, no signs of abscess, no signs of deep space infection.  Discharged with symptomatic care, doxycycline and return precautions.  Verbalized understanding and agreement to today's plan had no further questions or concerns.    Final Clinical Impressions(s) / ED Diagnoses   Final diagnoses:  Cellulitis of left upper extremity  Laceration of left forearm, initial encounter    ED Discharge Orders    None       Francee Gentile 07/05/19 [id number]    Quintella Reichert, MD 07/06/19 [phone]

## 2019-07-05 NOTE — ED Notes (Signed)
Pt verbalizes understanding of d/c instructions. Prescriptions reviewed with patient. Pt ambulatory at d/c with all belongings.  

## 2019-07-05 NOTE — ED Notes (Signed)
Pt requesting something "for pain" -- ambulatory, has been waiting outside for room-- wound is reddened, no drainage,

## 2019-07-06 ENCOUNTER — Other Ambulatory Visit: Payer: Self-pay

## 2019-07-06 ENCOUNTER — Emergency Department (HOSPITAL_COMMUNITY)
Admission: EM | Admit: 2019-07-06 | Discharge: 2019-07-06 | Discharge status: Against Medical Advice | Payer: Medicare HMO | Attending: Emergency Medicine | Admitting: Emergency Medicine

## 2019-07-06 ENCOUNTER — Encounter (HOSPITAL_COMMUNITY): Payer: Self-pay | Admitting: Emergency Medicine

## 2019-07-06 DIAGNOSIS — L539 Erythematous condition, unspecified: Secondary | ICD-10-CM | POA: Diagnosis present

## 2019-07-06 DIAGNOSIS — Z5321 Procedure and treatment not carried out due to patient leaving prior to being seen by health care provider: Secondary | ICD-10-CM | POA: Diagnosis not present

## 2019-07-06 NOTE — ED Triage Notes (Signed)
Pt cut his arm 2 days ago and was treated yesterday with doxycycline and return instructions- pt states today it has been more swollen and he has had chills.

## 2019-07-06 NOTE — ED Notes (Signed)
Called pt. No response.

## 2019-07-06 NOTE — ED Notes (Signed)
Pt called multiple times with no response. This tech does not see him in the waiting room.

## 2019-07-09 ENCOUNTER — Other Ambulatory Visit: Payer: Self-pay

## 2019-07-09 ENCOUNTER — Encounter (HOSPITAL_COMMUNITY): Payer: Self-pay | Admitting: Emergency Medicine

## 2019-07-09 ENCOUNTER — Emergency Department (HOSPITAL_COMMUNITY)
Admission: EM | Admit: 2019-07-09 | Discharge: 2019-07-09 | Disposition: A | Discharge status: Home / Self Care | Payer: Worker's Compensation | Attending: Emergency Medicine | Admitting: Emergency Medicine

## 2019-07-09 DIAGNOSIS — F17210 Nicotine dependence, cigarettes, uncomplicated: Secondary | ICD-10-CM | POA: Diagnosis not present

## 2019-07-09 DIAGNOSIS — I10 Essential (primary) hypertension: Secondary | ICD-10-CM | POA: Diagnosis not present

## 2019-07-09 DIAGNOSIS — Z4800 Encounter for change or removal of nonsurgical wound dressing: Secondary | ICD-10-CM | POA: Insufficient documentation

## 2019-07-09 DIAGNOSIS — Z79899 Other long term (current) drug therapy: Secondary | ICD-10-CM | POA: Diagnosis not present

## 2019-07-09 DIAGNOSIS — Z5189 Encounter for other specified aftercare: Secondary | ICD-10-CM

## 2019-07-09 DIAGNOSIS — J45909 Unspecified asthma, uncomplicated: Secondary | ICD-10-CM | POA: Insufficient documentation

## 2019-07-09 MED ORDER — CEPHALEXIN 500 MG PO CAPS: 500.0000 mg | ORAL_CAPSULE | Freq: Four times a day (QID) | ORAL | 0 refills | Status: DC

## 2019-07-09 MED ORDER — BACITRACIN ZINC 500 UNIT/GM EX OINT
TOPICAL_OINTMENT | Freq: Two times a day (BID) | CUTANEOUS | Status: DC
  Administered 2019-07-09: 1 via TOPICAL
  Filled 2019-07-09: qty 1.8

## 2019-07-09 MED ORDER — ONDANSETRON 4 MG PO TBDP
4.0000 mg | ORAL_TABLET | Freq: Once | ORAL | Status: AC
  Administered 2019-07-09: 21:00:00 4 mg via ORAL
  Filled 2019-07-09: qty 1

## 2019-07-09 MED ORDER — HYDROCODONE-ACETAMINOPHEN 5-325 MG PO TABS
1.0000 | ORAL_TABLET | Freq: Once | ORAL | Status: AC
  Administered 2019-07-09: 23:00:00 1 via ORAL
  Filled 2019-07-09: qty 1

## 2019-07-09 NOTE — ED Notes (Signed)
Patient verbalizes understanding of discharge instructions. Opportunity for questioning and answers were provided. Armband removed by staff, pt discharged from ED ambulatory w/ SO

## 2019-07-09 NOTE — Discharge Instructions (Signed)
Your wound is healing appropriately.  Continue to apply Neosporin daily to decrease risk of infection.  If you notice worsening redness or worsening pain please take Keflex as prescribed.

## 2019-07-09 NOTE — ED Provider Notes (Signed)
[facility] EMERGENCY DEPARTMENT Provider Note   CSN: [id number] Arrival date & time: 07/09/19  2055     History   Chief Complaint Chief Complaint  Patient presents with   Arm Infection    HPI [name] is a 43 y.o. male.     The history is provided by the patient and medical records. No language interpreter was used.     43 year old male with history of schizophrenia, bipolar, recent left forearm injury presenting for evaluation of increased arm pain.  Patient injured his left forearm while at work approximately a week ago.  He reported was cut by a piece of metal.  He subsequently developed an infection at the site.  He was seen on 8/24 for his symptoms and subsequently discharged home with doxycycline.  Patient states he has finished with the antibiotics but continues to endorse moderate amount of sharp throbbing pain to the wound area.  He also noticed increased redness as well as having fever of 101 at home and chills.  He mentioned that his boss does not want him to return to work until his wound has been addressed.  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  He is right-hand dominant.  He is up-to-date with tetanus.  He has been taking Tylenol and ibuprofen at home without adequate relief.  He did mention pain is radiating up to his forearm and down to his hand.  He denies any numbness or weakness.   Past Medical History:  Diagnosis Date   Anxiety    Asthma    Bipolar 1 disorder ([place])    Chronic back pain    Complication of anesthesia    Anxiety when he awaken with ET tube still in place   GERD (gastroesophageal reflux disease)    Hyperlipidemia    Hypoventilation syndrome    Left testicular torsion    "20 years ago"   Lumbar radiculopathy    Migraines    Schizoaffective disorder ([place])    [place]    Patient Active Problem List   Diagnosis Date Noted   Chronic pain disorder 05/04/2019   Reactive airway disease that is not asthma  05/04/2019   Asthma 04/29/2019   Auditory hallucinations 01/20/2019   Rectal bleeding 04/14/2018   Dysphagia 12/24/2017   Schizophrenia ([place]) 05/28/2016   Abdominal pain, epigastric 02/04/2013   Constipation 12/02/2012   Abdominal pain 11/22/2012   INSOMNIA 04/26/2009   BACK PAIN, LUMBAR 03/20/2009   BIPOLAR AFFECTIVE DISORDER 02/17/2009   OTHER ENTHESOPATHY OF ANKLE AND TARSUS 10/05/2008   GERD 09/16/2008   PERSONAL HISTORY OF SCHIZOPHRENIA 05/27/2008   ESOPHAGITIS, REFLUX 03/23/2008   HIATAL HERNIA 03/23/2008   Hyperlipidemia, mixed 01/07/2008   MORBID OBESITY 01/07/2008   ANXIETY DEPRESSION 01/07/2008   TOBACCO ABUSE 01/07/2008   HYPERTENSION 01/07/2008   ALLERGIC RHINITIS 01/07/2008   IBS 01/07/2008    Past Surgical History:  Procedure Laterality Date   ANKLE SURGERY     APPENDECTOMY     BIOPSY  01/15/2018   Procedure: BIOPSY;  Surgeon: Daneil Dolin, MD;  [facility];  Service: Endoscopy;;  gastric   CHOLECYSTECTOMY     ESOPHAGOGASTRODUODENOSCOPY  03/17/2008   RMR: A tiny distal esophageal erosions consistent with mild  erosive reflux esophagitis, otherwise normal esophagus, small hiatal hernia, minimally polypoid antral mucosa with doubtful clinical was significance.  Otherwise, normal stomach, patent pylorus, and normal D1-D2   ESOPHAGOGASTRODUODENOSCOPY (EGD) WITH PROPOFOL N/A 02/18/2013   Procedure: ESOPHAGOGASTRODUODENOSCOPY (EGD) WITH  PROPOFOL;  Surgeon: Daneil Dolin, MD;  Location: [facility];  Service: Endoscopy;  Laterality: N/A;   ESOPHAGOGASTRODUODENOSCOPY (EGD) WITH PROPOFOL N/A 01/15/2018   Procedure: ESOPHAGOGASTRODUODENOSCOPY (EGD) WITH PROPOFOL;  Surgeon: Daneil Dolin, MD;  [facility];  Service: Endoscopy;  Laterality: N/A;  2:15pm   MALONEY DILATION N/A 01/15/2018   Procedure: Venia Minks DILATION;  Surgeon: Daneil Dolin, MD;  [facility];  Service: Endoscopy;  Laterality: N/A;   NASAL SINUS  SURGERY  01/24/2012   Procedure: ENDOSCOPIC SINUS SURGERY;  Surgeon: Izora Gala, MD;  Location: [place];  Service: ENT;  Laterality: Left;  left ethmoidectomy, maxillary, frontal   UMBILICAL HERNIA REPAIR          Home Medications    Prior to Admission medications   Medication Sig Start Date End Date Taking? Authorizing Provider  albuterol (PROVENTIL) (2.5 MG/3ML) 0.083% nebulizer solution Take 3 mLs (2.5 mg total) by nebulization every 4 (four) hours as needed for wheezing or shortness of breath. 04/29/19   Laurey Morale, MD  albuterol (VENTOLIN HFA) 108 (90 Base) MCG/ACT inhaler INHALE 2 PUFFS INTO THE LUNGS EVERY 6 HOURS AS NEEDED FOR WHEEZING OR SHORTNESS OF BREATH Patient taking differently: Inhale 2 puffs into the lungs every 6 (six) hours as needed for wheezing or shortness of breath.  04/27/19   [place], Betty G, MD  ALPRAZolam Duanne Moron) 1 MG tablet Take 1 tablet (1 mg total) by mouth 2 (two) times daily as needed for anxiety. 05/06/19   [place], Betty G, MD  ARIPiprazole (ABILIFY) 20 MG tablet Take 1 tablet (20 mg total) by mouth 2 (two) times daily. Patient not taking: Reported on 05/31/2019 04/09/19   Laurey Morale, MD  baclofen 5 MG TABS Take 5 mg by mouth at bedtime as needed for muscle spasms. 06/05/19   Tasia Catchings, Amy V, PA-C  doxycycline (VIBRAMYCIN) 100 MG capsule Take 1 capsule (100 mg total) by mouth 2 (two) times daily. 07/05/19   Hedges, Dellis Filbert, PA-C  Fluticasone-Salmeterol (ADVAIR DISKUS) 250-50 MCG/DOSE AEPB Inhale 1 puff into the lungs 2 (two) times daily. Patient not taking: Reported on 05/31/2019 05/04/19   [place], Betty G, MD  gabapentin (NEURONTIN) 300 MG capsule Take 1 capsule (300 mg total) by mouth 3 (three) times daily. Patient not taking: Reported on 05/31/2019 04/16/19   [place], Betty G, MD  pantoprazole (PROTONIX) 40 MG tablet Take 1 tablet (40 mg total) by mouth daily. 03/29/19   [place], Betty G, MD  predniSONE (STERAPRED UNI-PAK 21 TAB) 10 MG (21) TBPK tablet Take by mouth  daily. Take 6 tabs by mouth day 1, then 5 tabs, then 4 tabs, then 3 tabs, 2 tabs, then 1 tab for the last day 06/05/19   Ok Edwards, PA-C  venlafaxine XR (EFFEXOR XR) 75 MG 24 hr capsule Take 1 capsule (75 mg total) by mouth daily. Patient not taking: Reported on 05/18/2019 04/09/19   Laurey Morale, MD    Family History Family History  Problem Relation Age of Onset   Anesthesia problems Mother    Colon cancer Maternal Grandfather    Crohn's disease Maternal Grandfather    Gastric cancer Paternal Uncle    Esophageal cancer Neg Hx     Social History Social History   Tobacco Use   Smoking status: Current Every Day Smoker    Packs/day: 1.00    Years: 22.00    Pack years: 22.00    Types: Cigarettes   Smokeless tobacco: Never  Used  Substance Use Topics   Alcohol use: Yes    Comment: occ   Drug use: No     Allergies   Compazine, Imitrex [sumatriptan base], Ketorolac tromethamine, Prochlorperazine, Risperidone, Sumatriptan, Risperidone and related, and Risperidone   Review of Systems Review of Systems  Constitutional: Positive for fever.  Skin: Positive for wound.  Neurological: Negative for numbness.     Physical Exam Updated Vital Signs BP (!) 110/91 (BP Location: Right Arm)    Pulse 68    Temp 98.6 F (37 C) (Oral)    Resp 18    Ht 6\' 2"  (1.88 m)    Wt 132.5 kg    SpO2 96%    BMI 37.49 kg/m   Physical Exam Vitals signs and nursing note reviewed.  Constitutional:      General: He is not in acute distress.    Appearance: He is well-developed.  HENT:     Head: Atraumatic.  Eyes:     Conjunctiva/sclera: Conjunctivae normal.  Neck:     Musculoskeletal: Neck supple.  Skin:    Findings: No rash.     Comments: Left forearm: There is an old wound noted to the distal forearm on the volar aspect with scab formation and mild surrounding skin erythema consistent with a healing wound.  No obvious cellulitis appreciated.  No abscess noted.  No lymphangitis.  Forearm  compartment is soft however patient exhibits exquisite pain with even slight touch to the affected area.  Radial pulse 2+.  Neurological:     Mental Status: He is alert.      ED Treatments / Results  Labs (all labs ordered are listed, but only abnormal results are displayed) Labs Reviewed - No data to display  EKG None  Radiology No results found.  Procedures Procedures (including critical care time)  Medications Ordered in ED Medications  ondansetron (ZOFRAN-ODT) disintegrating tablet 4 mg (4 mg Oral Given 07/09/19 2126)     Initial Impression / Assessment and Plan / ED Course  I have reviewed the triage vital signs and the nursing notes.  Pertinent labs & imaging results that were available during my care of the patient were reviewed by me and considered in my medical decision making (see chart for details).        BP (!) 110/91 (BP Location: Right Arm)    Pulse 68    Temp 98.6 F (37 C) (Oral)    Resp 18    Ht 6\' 2"  (1.88 m)    Wt 132.5 kg    SpO2 96%    BMI 37.49 kg/m    Final Clinical Impressions(s) / ED Diagnoses   Final diagnoses:  Encounter for wound re-check    ED Discharge Orders         Ordered    cephALEXin (KEFLEX) 500 MG capsule  4 times daily     07/09/19 2252         10:50 PM Patient has a superficial laceration noted to his left forearm with appropriate skin healing changes.  Patient however voiced concern of infection.  On exam, no obvious cellulitis or abscess noted.  No compartment syndrome, no lymphangitis.  He does request a work note, will provide as requested.  Will provide Keflex if condition worsen.  Return precaution discussed.   Domenic Moras, PA-C 07/09/19 2254    Isla Pence, MD 07/09/19 [phone]

## 2019-07-09 NOTE — ED Triage Notes (Signed)
Pt st's he cut his arm at work.  Was seen here on 8/25 for increased pain, swelling and chills.  Pt returned today, st's arm is not any better and now having nausea and vomiting

## 2019-07-19 ENCOUNTER — Ambulatory Visit
Admission: EM | Admit: 2019-07-19 | Discharge: 2019-07-19 | Disposition: A | Discharge status: Home / Self Care | Payer: Medicare HMO

## 2019-07-19 ENCOUNTER — Other Ambulatory Visit: Payer: Self-pay

## 2019-07-19 DIAGNOSIS — M545 Low back pain, unspecified: Secondary | ICD-10-CM

## 2019-07-19 DIAGNOSIS — Z0289 Encounter for other administrative examinations: Secondary | ICD-10-CM | POA: Diagnosis not present

## 2019-07-19 NOTE — ED Provider Notes (Signed)
[facility]    CSN: [id number] Arrival date & time: 07/19/19  1059      History   Chief Complaint Chief Complaint  Patient presents with  . nerve pain    HPI [name] is a 43 y.o. male with history of bipolar 1 disorder, chronic low back pain second to lumbar radiculopathy presenting for acute bilateral low back pain with radiation over the ventral aspect of his thighs since yesterday.  Patient states that he fell, though declines to answer questions regarding fall.  Denies LOC/head trauma.  Declines x-ray today.  States he has neurosurgeon appointment tomorrow for chronic pain medication refill.  Took oxycodone today without relief.  Denies saddle area anesthesia, urinary or fecal incontinence/change in bowel or bladder habit.   Past Medical History:  Diagnosis Date  . Anxiety   . Asthma   . Bipolar 1 disorder ([place])   . Chronic back pain   . Complication of anesthesia    Anxiety when he awaken with ET tube still in place  . GERD (gastroesophageal reflux disease)   . Hyperlipidemia   . Hypoventilation syndrome   . Left testicular torsion    "20 years ago"  . Lumbar radiculopathy   . Migraines   . Schizoaffective disorder ([place])    [place]    Patient Active Problem List   Diagnosis Date Noted  . Chronic pain disorder 05/04/2019  . Reactive airway disease that is not asthma 05/04/2019  . Asthma 04/29/2019  . Auditory hallucinations 01/20/2019  . Rectal bleeding 04/14/2018  . Dysphagia 12/24/2017  . Schizophrenia ([place]) 05/28/2016  . Abdominal pain, epigastric 02/04/2013  . Constipation 12/02/2012  . Abdominal pain 11/22/2012  . INSOMNIA 04/26/2009  . BACK PAIN, LUMBAR 03/20/2009  . BIPOLAR AFFECTIVE DISORDER 02/17/2009  . OTHER ENTHESOPATHY OF ANKLE AND TARSUS 10/05/2008  . GERD 09/16/2008  . PERSONAL HISTORY OF SCHIZOPHRENIA 05/27/2008  . ESOPHAGITIS, REFLUX 03/23/2008  . HIATAL HERNIA 03/23/2008  . Hyperlipidemia, mixed 01/07/2008  .  MORBID OBESITY 01/07/2008  . ANXIETY DEPRESSION 01/07/2008  . TOBACCO ABUSE 01/07/2008  . HYPERTENSION 01/07/2008  . ALLERGIC RHINITIS 01/07/2008  . IBS 01/07/2008    Past Surgical History:  Procedure Laterality Date  . ANKLE SURGERY    . APPENDECTOMY    . BIOPSY  01/15/2018   Procedure: BIOPSY;  Surgeon: Daneil Dolin, MD;  [facility];  Service: Endoscopy;;  gastric  . CHOLECYSTECTOMY    . ESOPHAGOGASTRODUODENOSCOPY  03/17/2008   RMR: A tiny distal esophageal erosions consistent with mild  erosive reflux esophagitis, otherwise normal esophagus, small hiatal hernia, minimally polypoid antral mucosa with doubtful clinical was significance.  Otherwise, normal stomach, patent pylorus, and normal D1-D2  . ESOPHAGOGASTRODUODENOSCOPY (EGD) WITH PROPOFOL N/A 02/18/2013   Procedure: ESOPHAGOGASTRODUODENOSCOPY (EGD) WITH PROPOFOL;  Surgeon: Daneil Dolin, MD;  Location: [facility];  Service: Endoscopy;  Laterality: N/A;  . ESOPHAGOGASTRODUODENOSCOPY (EGD) WITH PROPOFOL N/A 01/15/2018   Procedure: ESOPHAGOGASTRODUODENOSCOPY (EGD) WITH PROPOFOL;  Surgeon: Daneil Dolin, MD;  [facility];  Service: Endoscopy;  Laterality: N/A;  2:15pm  . MALONEY DILATION N/A 01/15/2018   Procedure: Venia Minks DILATION;  Surgeon: Daneil Dolin, MD;  [facility];  Service: Endoscopy;  Laterality: N/A;  . NASAL SINUS SURGERY  01/24/2012   Procedure: ENDOSCOPIC SINUS SURGERY;  Surgeon: Izora Gala, MD;  Location: [place];  Service: ENT;  Laterality: Left;  left ethmoidectomy, maxillary, frontal  . UMBILICAL HERNIA REPAIR  Home Medications    Prior to Admission medications   Medication Sig Start Date End Date Taking? Authorizing Provider  meloxicam (MOBIC) 15 MG tablet Take 15 mg by mouth daily.   Yes [provider]  albuterol (PROVENTIL) (2.5 MG/3ML) 0.083% nebulizer solution Take 3 mLs (2.5 mg total) by nebulization every 4 (four) hours as needed for wheezing or  shortness of breath. 04/29/19   Laurey Morale, MD  albuterol (VENTOLIN HFA) 108 (90 Base) MCG/ACT inhaler INHALE 2 PUFFS INTO THE LUNGS EVERY 6 HOURS AS NEEDED FOR WHEEZING OR SHORTNESS OF BREATH Patient taking differently: Inhale 2 puffs into the lungs every 6 (six) hours as needed for wheezing or shortness of breath.  04/27/19   [place], Betty G, MD  ALPRAZolam Duanne Moron) 1 MG tablet Take 1 tablet (1 mg total) by mouth 2 (two) times daily as needed for anxiety. 05/06/19   [place], Betty G, MD  ARIPiprazole (ABILIFY) 20 MG tablet Take 1 tablet (20 mg total) by mouth 2 (two) times daily. Patient not taking: Reported on 05/31/2019 04/09/19   Laurey Morale, MD  baclofen 5 MG TABS Take 5 mg by mouth at bedtime as needed for muscle spasms. 06/05/19   Tasia Catchings, Amy V, PA-C  cephALEXin (KEFLEX) 500 MG capsule Take 1 capsule (500 mg total) by mouth 4 (four) times daily. 07/09/19   Domenic Moras, PA-C  doxycycline (VIBRAMYCIN) 100 MG capsule Take 1 capsule (100 mg total) by mouth 2 (two) times daily. 07/05/19   Hedges, Dellis Filbert, PA-C  Fluticasone-Salmeterol (ADVAIR DISKUS) 250-50 MCG/DOSE AEPB Inhale 1 puff into the lungs 2 (two) times daily. Patient not taking: Reported on 05/31/2019 05/04/19   [place], Betty G, MD  gabapentin (NEURONTIN) 300 MG capsule Take 1 capsule (300 mg total) by mouth 3 (three) times daily. Patient not taking: Reported on 05/31/2019 04/16/19   [place], Betty G, MD  pantoprazole (PROTONIX) 40 MG tablet Take 1 tablet (40 mg total) by mouth daily. 03/29/19   [place], Betty G, MD  predniSONE (STERAPRED UNI-PAK 21 TAB) 10 MG (21) TBPK tablet Take by mouth daily. Take 6 tabs by mouth day 1, then 5 tabs, then 4 tabs, then 3 tabs, 2 tabs, then 1 tab for the last day 06/05/19   Ok Edwards, PA-C  venlafaxine XR (EFFEXOR XR) 75 MG 24 hr capsule Take 1 capsule (75 mg total) by mouth daily. Patient not taking: Reported on 05/18/2019 04/09/19   Laurey Morale, MD    Family History Family History  Problem Relation Age of Onset   . Anesthesia problems Mother   . Colon cancer Maternal Grandfather   . Crohn's disease Maternal Grandfather   . Gastric cancer Paternal Uncle   . Esophageal cancer Neg Hx     Social History Social History   Tobacco Use  . Smoking status: Current Every Day Smoker    Packs/day: 1.00    Years: 22.00    Pack years: 22.00    Types: Cigarettes  . Smokeless tobacco: Never Used  Substance Use Topics  . Alcohol use: Yes    Comment: occ  . Drug use: No     Allergies   Compazine, Imitrex [sumatriptan base], Ketorolac tromethamine, Prochlorperazine, Risperidone, Sumatriptan, Risperidone and related, and Risperidone   Review of Systems Review of Systems  Constitutional: Negative for fatigue and fever.  Respiratory: Negative for cough and shortness of breath.   Cardiovascular: Negative for chest pain and palpitations.  Gastrointestinal: Negative for abdominal pain, diarrhea and vomiting.  Musculoskeletal: Positive for back pain. Negative for arthralgias, myalgias and neck pain.  Skin: Negative for rash and wound.  Neurological: Negative for speech difficulty and headaches.  All other systems reviewed and are negative.    Physical Exam Triage Vital Signs ED Triage Vitals  Enc Vitals Group     BP      Pulse      Resp      Temp      Temp src      SpO2      Weight      Height      Head Circumference      Peak Flow      Pain Score      Pain Loc      Pain Edu?      Excl. in [place]?    No data found.  Updated Vital Signs BP 103/69 (BP Location: Left Arm)   Pulse 86   Temp 98.6 F (37 C) (Oral)   Resp 20   SpO2 97%   Visual Acuity Right Eye Distance:   Left Eye Distance:   Bilateral Distance:    Right Eye Near:   Left Eye Near:    Bilateral Near:     Physical Exam Constitutional:      General: He is not in acute distress. HENT:     Head: Normocephalic and atraumatic.     Mouth/Throat:     Mouth: Mucous membranes are moist.     Pharynx: Oropharynx is  clear.  Eyes:     General: No scleral icterus.    Pupils: Pupils are equal, round, and reactive to light.  Cardiovascular:     Rate and Rhythm: Normal rate.  Pulmonary:     Effort: Pulmonary effort is normal.  Musculoskeletal:     Comments: No obvious deformity or scoliosis of lumbar spine.  No erythema, bruising, masses.  Patient declined palpation "do not touch, it just look ".  Patient is able to get up out of chair without assistance, ambulate to the bathroom without obvious pain/abnormal gait.  Skin:    Coloration: Skin is not jaundiced or pale.  Neurological:     Mental Status: He is alert and oriented to person, place, and time.  Psychiatric:     Comments: Minimal eye contact, dismissive behavior.  Patient speech is fluent/articulate, without pressured speech or tangential conversation      UC Treatments / Results  Labs (all labs ordered are listed, but only abnormal results are displayed) Labs Reviewed - No data to display  EKG   Radiology No results found.  Procedures Procedures (including critical care time)  Medications Ordered in UC Medications - No data to display  Initial Impression / Assessment and Plan / UC Course  I have reviewed the triage vital signs and the nursing notes.  Pertinent labs & imaging results that were available during my care of the patient were reviewed by me and considered in my medical decision making (see chart for details).     1.  Acute bilateral low back pain Patient states today's encounter strictly for work note: Seen his neurosurgeon tomorrow for medication refill.  Declined x-ray/physical exam today.  Work note provided: Stressed importance of keeping follow-up appointment.  Provided contact information for occupational health.  Return precautions discussed, patient verbalized understanding and is agreeable to plan. Final Clinical Impressions(s) / UC Diagnoses   Final diagnoses:  Acute bilateral low back pain without  sciatica     Discharge Instructions  Keep your appoint with your neurosurgeon tomorrow. Go to ER for further evaluation for continued pain, saddle area anesthesia, urinary or fecal incontinence, inability to walk.    ED Prescriptions    None     Controlled Substance Prescriptions [place] Controlled Substance Registry consulted? Yes, I have consulted the [place] Controlled Substances Registry for this patient.   [place], [place], [place] 07/19/19 1148

## 2019-07-19 NOTE — Discharge Instructions (Addendum)
Keep your appoint with your neurosurgeon tomorrow. Go to ER for further evaluation for continued pain, saddle area anesthesia, urinary or fecal incontinence, inability to walk.

## 2019-07-19 NOTE — ED Triage Notes (Signed)
Pt presents to UC w/ c/o nerve pain since 8/18 and was seen by primary provider who prescribed medications but pt states they are not helping. Pt reports shooting pain from lower back down legs. Pt reports taking 10mg  oxycodone this morning for pain without relief.

## 2019-07-21 ENCOUNTER — Emergency Department (HOSPITAL_COMMUNITY): Payer: Medicare HMO

## 2019-07-21 ENCOUNTER — Encounter (HOSPITAL_COMMUNITY): Payer: Self-pay | Admitting: Emergency Medicine

## 2019-07-21 ENCOUNTER — Emergency Department (HOSPITAL_COMMUNITY)
Admission: EM | Admit: 2019-07-21 | Discharge: 2019-07-22 | Disposition: A | Discharge status: Home / Self Care | Payer: Medicare HMO | Attending: Emergency Medicine | Admitting: Emergency Medicine

## 2019-07-21 DIAGNOSIS — M545 Low back pain, unspecified: Secondary | ICD-10-CM

## 2019-07-21 DIAGNOSIS — F17210 Nicotine dependence, cigarettes, uncomplicated: Secondary | ICD-10-CM | POA: Diagnosis not present

## 2019-07-21 DIAGNOSIS — J45909 Unspecified asthma, uncomplicated: Secondary | ICD-10-CM | POA: Diagnosis not present

## 2019-07-21 DIAGNOSIS — G8929 Other chronic pain: Secondary | ICD-10-CM | POA: Diagnosis not present

## 2019-07-21 DIAGNOSIS — R200 Anesthesia of skin: Secondary | ICD-10-CM

## 2019-07-21 LAB — COMPREHENSIVE METABOLIC PANEL
ALT: 71 U/L — ABNORMAL HIGH (ref 0–44)
AST: 30 U/L (ref 15–41)
Albumin: 3.9 g/dL (ref 3.5–5.0)
Alkaline Phosphatase: 189 U/L — ABNORMAL HIGH (ref 38–126)
Anion gap: 10 (ref 5–15)
BUN: 6 mg/dL (ref 6–20)
CO2: 25 mmol/L (ref 22–32)
Calcium: 9 mg/dL (ref 8.9–10.3)
Chloride: 100 mmol/L (ref 98–111)
Creatinine, Ser: 0.9 mg/dL (ref 0.61–1.24)
GFR calc Af Amer: >60 mL/min (ref >60)
GFR calc non Af Amer: >60 mL/min (ref >60)
Glucose, Bld: 118 mg/dL — ABNORMAL HIGH (ref 70–99)
Potassium: 4.2 mmol/L (ref 3.5–5.1)
Sodium: 135 mmol/L (ref 135–145)
Total Bilirubin: 0.6 mg/dL (ref 0.3–1.2)
Total Protein: 6.7 g/dL (ref 6.5–8.1)

## 2019-07-21 LAB — CBC
HCT: 47.1 % (ref 39.0–52.0)
Hemoglobin: 16.1 g/dL (ref 13.0–17.0)
MCH: 31.2 pg (ref 26.0–34.0)
MCHC: 34.2 g/dL (ref 30.0–36.0)
MCV: 91.3 fL (ref 80.0–100.0)
Platelets: 218 10*3/uL (ref 150–400)
RBC: 5.16 MIL/uL (ref 4.22–5.81)
RDW: 13 % (ref 11.5–15.5)
WBC: 7.6 10*3/uL (ref 4.0–10.5)
nRBC: 0 % (ref 0.0–0.2)

## 2019-07-21 MED ORDER — PREDNISONE 10 MG PO TABS: ORAL_TABLET | ORAL | 0 refills | Status: DC

## 2019-07-21 MED ORDER — OXYCODONE-ACETAMINOPHEN 5-325 MG PO TABS
2.0000 | ORAL_TABLET | Freq: Once | ORAL | Status: AC
  Administered 2019-07-21: 2 via ORAL
  Filled 2019-07-21: qty 2

## 2019-07-21 MED ORDER — LORAZEPAM 2 MG/ML IJ SOLN
1.0000 mg | Freq: Once | INTRAMUSCULAR | Status: AC | PRN
  Administered 2019-07-21: 1 mg via INTRAVENOUS
  Filled 2019-07-21: qty 1

## 2019-07-21 MED ORDER — ACETAMINOPHEN 500 MG PO TABS
1000.0000 mg | ORAL_TABLET | Freq: Once | ORAL | Status: AC
  Administered 2019-07-21: 1000 mg via ORAL
  Filled 2019-07-21: qty 2

## 2019-07-21 MED ORDER — HYDROMORPHONE HCL 1 MG/ML IJ SOLN
0.5000 mg | Freq: Once | INTRAMUSCULAR | Status: AC
  Administered 2019-07-21: 0.5 mg via INTRAVENOUS
  Filled 2019-07-21: qty 1

## 2019-07-21 MED ORDER — HYDROMORPHONE HCL 1 MG/ML IJ SOLN
0.5000 mg | Freq: Once | INTRAMUSCULAR | Status: AC
  Administered 2019-07-21: 10:00:00 0.5 mg via INTRAVENOUS
  Filled 2019-07-21: qty 1

## 2019-07-21 NOTE — ED Notes (Signed)
Patient transported to MRI 

## 2019-07-21 NOTE — ED Notes (Signed)
Pt transported to MRI 

## 2019-07-21 NOTE — ED Notes (Signed)
Pt transported back to mri.

## 2019-07-21 NOTE — ED Notes (Signed)
Pt back from MRI and repeatedly demanding pain medication, states that his pain is a 10/10 and that he needs something else for pain.

## 2019-07-21 NOTE — ED Triage Notes (Signed)
Pt reports chronic lumbar back pain with appt to see neuro surgery at the end of this month. Pt states pain has been progressively been getting worse but woke up this morning with saddle numbness- pt has numbness in groin and both side of upper thigh. Pt denies and loss of bowel or bladder. Pt was ambulatory into ed but extremely painful to walk.

## 2019-07-21 NOTE — ED Provider Notes (Signed)
MSE was initiated and I personally evaluated the patient and placed orders (if any) at  6:48 AM on July 21, 2019.  The patient appears stable so that the remainder of the MSE may be completed by another provider.  43 year old male with chronic back pain woke up this morning with new onset numbness in the medial aspect of both thighs.  He denies bowel or bladder dysfunction.  He took an extra dose of gabapentin and meloxicam without any relief.  He rates pain at 10/10.  On exam, he has decreased sensation to the medial and anterior aspects of both thighs with normal sensation of the lateral and posterior thigh and no in the distal legs.  No objective motor deficits.  He will be sent for MRI scan.  On review of old records, he did have an MRI in 2018 which showed multiple bulging disks but no spinal stenosis.   Delora Fuel, MD [id number] [phone]

## 2019-07-21 NOTE — ED Notes (Signed)
MD Roxanne Mins asked to come to triage to evaluate this patient.

## 2019-07-21 NOTE — ED Notes (Signed)
MRI called and informed pt is ready to come back now. Also informed of need for stat scan.

## 2019-07-21 NOTE — ED Notes (Signed)
Pts partner gave the pt eggs and orange juice around 8:00 am without staffs knowledge or consent.

## 2019-07-21 NOTE — Discharge Instructions (Signed)
You were evaluated in the Emergency Department and after careful evaluation, we did not find any emergent condition requiring admission or further testing in the hospital.  Your exam/testing today is overall reassuring.  We think that your back pain is causing some nerves to be inflamed.  Please take the prednisone as directed and follow-up with your regular doctors.  Please return to the Emergency Department if you experience any worsening of your condition.  We encourage you to follow up with a primary care provider.  Thank you for allowing [place] to be a part of your care.

## 2019-07-21 NOTE — ED Provider Notes (Signed)
[facility] Emergency Department Provider Note MRN:  [id number]  Arrival date & time: 07/21/19     Chief Complaint   Back Pain   History of Present Illness   [name] is a 43 y.o. year-old male with a history of chronic back pain, bipolar disorder presenting to the ED with chief complaint of back pain.  Long-term back pain, worse for the past 1 to 2 weeks.  This morning woke up with decreased sensation to his thighs and lap region.  Constant throughout the day.  Denies fever, no IV drug use, no chest pain or shortness of breath, no dysuria, no bowel or bladder dysfunction.  Pain is located in the lumbar and thoracic back, constant, severe.  Worse with motion.  Review of Systems  A complete 10 system review of systems was obtained and all systems are negative except as noted in the HPI and PMH.   Patient's Health History    Past Medical History:  Diagnosis Date  . Anxiety   . Asthma   . Bipolar 1 disorder ([place])   . Chronic back pain   . Complication of anesthesia    Anxiety when he awaken with ET tube still in place  . GERD (gastroesophageal reflux disease)   . Hyperlipidemia   . Hypoventilation syndrome   . Left testicular torsion    "20 years ago"  . Lumbar radiculopathy   . Migraines   . Schizoaffective disorder ([place])    Day Mark    Past Surgical History:  Procedure Laterality Date  . ANKLE SURGERY    . APPENDECTOMY    . BIOPSY  01/15/2018   Procedure: BIOPSY;  Surgeon: Daneil Dolin, MD;  [facility];  Service: Endoscopy;;  gastric  . CHOLECYSTECTOMY    . ESOPHAGOGASTRODUODENOSCOPY  03/17/2008   RMR: A tiny distal esophageal erosions consistent with mild  erosive reflux esophagitis, otherwise normal esophagus, small hiatal hernia, minimally polypoid antral mucosa with doubtful clinical was significance.  Otherwise, normal stomach, patent pylorus, and normal D1-D2  . ESOPHAGOGASTRODUODENOSCOPY (EGD) WITH PROPOFOL N/A  02/18/2013   Procedure: ESOPHAGOGASTRODUODENOSCOPY (EGD) WITH PROPOFOL;  Surgeon: Daneil Dolin, MD;  Location: [facility];  Service: Endoscopy;  Laterality: N/A;  . ESOPHAGOGASTRODUODENOSCOPY (EGD) WITH PROPOFOL N/A 01/15/2018   Procedure: ESOPHAGOGASTRODUODENOSCOPY (EGD) WITH PROPOFOL;  Surgeon: Daneil Dolin, MD;  [facility];  Service: Endoscopy;  Laterality: N/A;  2:15pm  . MALONEY DILATION N/A 01/15/2018   Procedure: Venia Minks DILATION;  Surgeon: Daneil Dolin, MD;  [facility];  Service: Endoscopy;  Laterality: N/A;  . NASAL SINUS SURGERY  01/24/2012   Procedure: ENDOSCOPIC SINUS SURGERY;  Surgeon: Izora Gala, MD;  Location: [facility] OR;  Service: ENT;  Laterality: Left;  left ethmoidectomy, maxillary, frontal  . UMBILICAL HERNIA REPAIR      Family History  Problem Relation Age of Onset  . Anesthesia problems Mother   . Colon cancer Maternal Grandfather   . Crohn's disease Maternal Grandfather   . Gastric cancer Paternal Uncle   . Esophageal cancer Neg Hx     Social History   Socioeconomic History  . Marital status: Divorced    Spouse name: Not on file  . Number of children: Not on file  . Years of education: Not on file  . Highest education level: Not on file  Occupational History  . Not on file  Social Needs  . Financial resource strain: Not on file  . Food insecurity  Worry: Not on file    Inability: Not on file  . Transportation needs    Medical: Not on file    Non-medical: Not on file  Tobacco Use  . Smoking status: Current Every Day Smoker    Packs/day: 1.00    Years: 22.00    Pack years: 22.00    Types: Cigarettes  . Smokeless tobacco: Never Used  Substance and Sexual Activity  . Alcohol use: Yes    Comment: occ  . Drug use: No  . Sexual activity: Not on file  Lifestyle  . Physical activity    Days per week: Not on file    Minutes per session: Not on file  . Stress: Not on file  Relationships  . Social Herbalist on phone:  Not on file    Gets together: Not on file    Attends religious service: Not on file    Active member of club or organization: Not on file    Attends meetings of clubs or organizations: Not on file    Relationship status: Not on file  . Intimate partner violence    Fear of current or ex partner: Not on file    Emotionally abused: Not on file    Physically abused: Not on file    Forced sexual activity: Not on file  Other Topics Concern  . Not on file  Social History Narrative  . Not on file     Physical Exam  Vital Signs and Nursing Notes reviewed Vitals:   07/21/19 0627  BP: 126/81  Pulse: 86  Resp: 20  Temp: 98.7 F (37.1 C)  SpO2: 96%    CONSTITUTIONAL: Well-appearing, NAD NEURO:  Alert and oriented x 3, decreased sensation to the anterior thighs and saddle region, normal strength, absent patellar deep tendon reflexes EYES:  eyes equal and reactive ENT/NECK:  no LAD, no JVD CARDIO: Regular rate, well-perfused, normal S1 and S2 PULM:  CTAB no wheezing or rhonchi GI/GU:  normal bowel sounds, non-distended, non-tender MSK/SPINE:  No gross deformities, no edema SKIN:  no rash, atraumatic PSYCH:  Appropriate speech and behavior  Diagnostic and Interventional Summary    Labs Reviewed  COMPREHENSIVE METABOLIC PANEL - Abnormal; Notable for the following components:      Result Value   Glucose, Bld 118 (*)    ALT 71 (*)    Alkaline Phosphatase 189 (*)    All other components within normal limits  CBC    MR LUMBAR SPINE WO CONTRAST  Final Result    MR THORACIC SPINE WO CONTRAST  Final Result      Medications  oxyCODONE-acetaminophen (PERCOCET/ROXICET) 5-325 MG per tablet 2 tablet (2 tablets Oral Given 07/21/19 0700)  HYDROmorphone (DILAUDID) injection 0.5 mg (0.5 mg Intravenous Given 07/21/19 0937)  LORazepam (ATIVAN) injection 1 mg (1 mg Intravenous Given 07/21/19 1106)  acetaminophen (TYLENOL) tablet 1,000 mg (1,000 mg Oral Given 07/21/19 1229)  HYDROmorphone  (DILAUDID) injection 0.5 mg (0.5 mg Intravenous Given 07/21/19 1230)     Procedures Critical Care  ED Course and Medical Decision Making  I have reviewed the triage vital signs and the nursing notes.  Pertinent labs & imaging results that were available during my care of the patient were reviewed by me and considered in my medical decision making (see below for details).  Concern for possible myelopathy in this 43 year old male with saddle anesthesia, loss of deep tendon reflexes.  Awaiting MRI.  MRI is largely unremarkable, there is question  of mild thoracic cord effacement.  The patient's presentation/symptoms as well as the MRI were discussed with Dr. Saintclair Halsted of neurosurgery.  In the absence of urinary retention, the symptoms and MRI findings are not consistent with cauda equina or emergent condition.  Patient is appropriate for outpatient management with prednisone.  Strict return precautions for urinary retention, incontinence of stool, worsening neurological deficit.  Barth Kirks. Sedonia Small, MD [place] [email or web address]  Final Clinical Impressions(s) / ED Diagnoses     ICD-10-CM   1. Chronic low back pain without sciatica, unspecified back pain laterality  M54.5    G89.29   2. Numbness  R20.0     ED Discharge Orders         Ordered    predniSONE (DELTASONE) 10 MG tablet     07/21/19 1314          Discharge Instructions Discussed with and Provided to Patient:   Discharge Instructions     You were evaluated in the Emergency Department and after careful evaluation, we did not find any emergent condition requiring admission or further testing in the hospital.  Your exam/testing today is overall reassuring.  We think that your back pain is causing some nerves to be inflamed.  Please take the prednisone as directed and follow-up with your regular doctors.  Please return to the Emergency Department if you experience any worsening of  your condition.  We encourage you to follow up with a primary care provider.  Thank you for allowing [place] to be a part of your care.       Maudie Flakes, MD 07/21/19 1318

## 2019-08-13 ENCOUNTER — Emergency Department (HOSPITAL_COMMUNITY)
Admission: EM | Admit: 2019-08-13 | Discharge: 2019-08-13 | Discharge status: Against Medical Advice | Payer: Medicare HMO | Attending: Emergency Medicine | Admitting: Emergency Medicine

## 2019-08-13 ENCOUNTER — Encounter (HOSPITAL_COMMUNITY): Payer: Self-pay

## 2019-08-13 ENCOUNTER — Other Ambulatory Visit: Payer: Self-pay

## 2019-08-13 DIAGNOSIS — R1010 Upper abdominal pain, unspecified: Secondary | ICD-10-CM | POA: Diagnosis present

## 2019-08-13 DIAGNOSIS — Z5321 Procedure and treatment not carried out due to patient leaving prior to being seen by health care provider: Secondary | ICD-10-CM | POA: Insufficient documentation

## 2019-08-13 DIAGNOSIS — R197 Diarrhea, unspecified: Secondary | ICD-10-CM | POA: Insufficient documentation

## 2019-08-13 NOTE — ED Triage Notes (Signed)
Patient c/o mid and upper abdominal pain, diarrhea, and vomiting dark red blood x 5-6 episodes since 0200 today.

## 2019-08-15 ENCOUNTER — Ambulatory Visit
Admission: EM | Admit: 2019-08-15 | Discharge: 2019-08-15 | Disposition: A | Discharge status: Home / Self Care | Payer: Medicare HMO | Attending: Physician Assistant | Admitting: Physician Assistant

## 2019-08-15 ENCOUNTER — Other Ambulatory Visit: Payer: Self-pay

## 2019-08-15 ENCOUNTER — Encounter: Payer: Self-pay | Admitting: Emergency Medicine

## 2019-08-15 DIAGNOSIS — L02419 Cutaneous abscess of limb, unspecified: Secondary | ICD-10-CM

## 2019-08-15 DIAGNOSIS — L03116 Cellulitis of left lower limb: Secondary | ICD-10-CM

## 2019-08-15 DIAGNOSIS — L02416 Cutaneous abscess of left lower limb: Secondary | ICD-10-CM | POA: Diagnosis not present

## 2019-08-15 MED ORDER — DOXYCYCLINE HYCLATE 100 MG PO CAPS: 100.0000 mg | ORAL_CAPSULE | Freq: Two times a day (BID) | ORAL | 0 refills | Status: DC

## 2019-08-15 MED ORDER — MUPIROCIN 2 % EX OINT: 1.0000 "application " | TOPICAL_OINTMENT | Freq: Two times a day (BID) | CUTANEOUS | 0 refills | Status: DC

## 2019-08-15 NOTE — ED Triage Notes (Addendum)
Pt presents to [facility] for assessment of abscess to left lower leg x 3 days.  Patient is not maintaining his mask over his nose and mouth, even after multiple requests.  Patient sniffling and clearing throat with RN in room.

## 2019-08-15 NOTE — Discharge Instructions (Addendum)
Start doxycycline as directed. You can remove current dressing in 24 hours. Keep wound clean and dry. You can clean gently with soap and water. Do not soak area in water. Monitor for spreading redness, increased warmth, increased swelling, fever, follow up for reevaluation needed.

## 2019-08-15 NOTE — ED Provider Notes (Signed)
[facility]    CSN: [id number] Arrival date & time: 08/15/19  1230      History   Chief Complaint Chief Complaint  Patient presents with   Abscess    HPI [name] is a 43 y.o. male.   43 year old male comes in for 3-day history of left lower leg swelling, redness, pain.  Patient states first started as insect bite, but noticed spreading erythema, warmth, increased pain.  The past 2 days, has had significant swelling, and worries for abscess.  Denies fever, chills, body aches.  Has not tried anything for the symptoms.      Past Medical History:  Diagnosis Date   Anxiety    Asthma    Bipolar 1 disorder ([place])    Chronic back pain    Complication of anesthesia    Anxiety when he awaken with ET tube still in place   GERD (gastroesophageal reflux disease)    Hyperlipidemia    Hypoventilation syndrome    Left testicular torsion    "20 years ago"   Lumbar radiculopathy    Migraines    Schizoaffective disorder ([place])    Day Mark    Patient Active Problem List   Diagnosis Date Noted   Chronic pain disorder 05/04/2019   Reactive airway disease that is not asthma 05/04/2019   Asthma 04/29/2019   Auditory hallucinations 01/20/2019   Rectal bleeding 04/14/2018   Dysphagia 12/24/2017   Schizophrenia ([place]) 05/28/2016   Abdominal pain, epigastric 02/04/2013   Constipation 12/02/2012   Abdominal pain 11/22/2012   INSOMNIA 04/26/2009   BACK PAIN, LUMBAR 03/20/2009   BIPOLAR AFFECTIVE DISORDER 02/17/2009   OTHER ENTHESOPATHY OF ANKLE AND TARSUS 10/05/2008   GERD 09/16/2008   PERSONAL HISTORY OF SCHIZOPHRENIA 05/27/2008   ESOPHAGITIS, REFLUX 03/23/2008   HIATAL HERNIA 03/23/2008   Hyperlipidemia, mixed 01/07/2008   MORBID OBESITY 01/07/2008   ANXIETY DEPRESSION 01/07/2008   TOBACCO ABUSE 01/07/2008   HYPERTENSION 01/07/2008   ALLERGIC RHINITIS 01/07/2008   IBS 01/07/2008    Past Surgical History:    Procedure Laterality Date   ANKLE SURGERY     APPENDECTOMY     BIOPSY  01/15/2018   Procedure: BIOPSY;  Surgeon: Daneil Dolin, MD;  [facility];  Service: Endoscopy;;  gastric   CHOLECYSTECTOMY     ESOPHAGOGASTRODUODENOSCOPY  03/17/2008   RMR: A tiny distal esophageal erosions consistent with mild  erosive reflux esophagitis, otherwise normal esophagus, small hiatal hernia, minimally polypoid antral mucosa with doubtful clinical was significance.  Otherwise, normal stomach, patent pylorus, and normal D1-D2   ESOPHAGOGASTRODUODENOSCOPY (EGD) WITH PROPOFOL N/A 02/18/2013   Procedure: ESOPHAGOGASTRODUODENOSCOPY (EGD) WITH PROPOFOL;  Surgeon: Daneil Dolin, MD;  Location: [facility];  Service: Endoscopy;  Laterality: N/A;   ESOPHAGOGASTRODUODENOSCOPY (EGD) WITH PROPOFOL N/A 01/15/2018   Procedure: ESOPHAGOGASTRODUODENOSCOPY (EGD) WITH PROPOFOL;  Surgeon: Daneil Dolin, MD;  [facility];  Service: Endoscopy;  Laterality: N/A;  2:15pm   MALONEY DILATION N/A 01/15/2018   Procedure: Venia Minks DILATION;  Surgeon: Daneil Dolin, MD;  [facility];  Service: Endoscopy;  Laterality: N/A;   NASAL SINUS SURGERY  01/24/2012   Procedure: ENDOSCOPIC SINUS SURGERY;  Surgeon: Izora Gala, MD;  Location: [facility];  Service: ENT;  Laterality: Left;  left ethmoidectomy, maxillary, frontal   UMBILICAL HERNIA REPAIR         Home Medications    Prior to Admission medications   Medication Sig Start Date End Date Taking? Authorizing  Provider  albuterol (PROVENTIL) (2.5 MG/3ML) 0.083% nebulizer solution Take 3 mLs (2.5 mg total) by nebulization every 4 (four) hours as needed for wheezing or shortness of breath. Patient not taking: Reported on 07/21/2019 04/29/19   Laurey Morale, MD  albuterol (VENTOLIN HFA) 108 (90 Base) MCG/ACT inhaler INHALE 2 PUFFS INTO THE LUNGS EVERY 6 HOURS AS NEEDED FOR WHEEZING OR SHORTNESS OF BREATH Patient taking differently: Inhale 2 puffs into the  lungs every 6 (six) hours as needed for wheezing or shortness of breath.  04/27/19   [place], Betty G, MD  ALPRAZolam Duanne Moron) 1 MG tablet Take 1 tablet (1 mg total) by mouth 2 (two) times daily as needed for anxiety. Patient taking differently: Take 1 mg by mouth 3 (three) times daily as needed for anxiety.  05/06/19   [place], Betty G, MD  ARIPiprazole (ABILIFY) 20 MG tablet Take 1 tablet (20 mg total) by mouth 2 (two) times daily. Patient not taking: Reported on 05/31/2019 04/09/19   Laurey Morale, MD  doxycycline (VIBRAMYCIN) 100 MG capsule Take 1 capsule (100 mg total) by mouth 2 (two) times daily. 08/15/19   Tasia Catchings, Saraiya Kozma V, PA-C  Fluticasone-Salmeterol (ADVAIR DISKUS) 250-50 MCG/DOSE AEPB Inhale 1 puff into the lungs 2 (two) times daily. 05/04/19   [place], Betty G, MD  gabapentin (NEURONTIN) 300 MG capsule Take 1 capsule (300 mg total) by mouth 3 (three) times daily. 04/16/19   [place], Betty G, MD  meloxicam (MOBIC) 15 MG tablet Take 15 mg by mouth daily.    [provider]  methylphenidate (RITALIN) 20 MG tablet Take 1 tablet by mouth daily. 07/07/19   [provider]  mupirocin ointment (BACTROBAN) 2 % Apply 1 application topically 2 (two) times daily. 08/15/19   Tasia Catchings, Mckenzee Beem V, PA-C  pantoprazole (PROTONIX) 40 MG tablet Take 1 tablet (40 mg total) by mouth daily. 03/29/19   [place], Betty G, MD  venlafaxine XR (EFFEXOR XR) 75 MG 24 hr capsule Take 1 capsule (75 mg total) by mouth daily. Patient not taking: Reported on 05/18/2019 04/09/19   Laurey Morale, MD    Family History Family History  Problem Relation Age of Onset   Anesthesia problems Mother    Colon cancer Maternal Grandfather    Crohn's disease Maternal Grandfather    Gastric cancer Paternal Uncle    Esophageal cancer Neg Hx     Social History Social History   Tobacco Use   Smoking status: Current Every Day Smoker    Packs/day: 1.00    Years: 22.00    Pack years: 22.00    Types: Cigarettes   Smokeless tobacco:  Never Used  Substance Use Topics   Alcohol use: Yes    Comment: occ   Drug use: No     Allergies   Compazine, Imitrex [sumatriptan base], Ketorolac tromethamine, Prochlorperazine, Risperidone, Sumatriptan, Risperidone and related, and Risperidone   Review of Systems Review of Systems  Reason unable to perform ROS: See HPI as above.     Physical Exam Triage Vital Signs ED Triage Vitals  Enc Vitals Group     BP 08/15/19 1243 (!) 142/86     Pulse Rate 08/15/19 1243 98     Resp 08/15/19 1243 18     Temp 08/15/19 1243 99.2 F (37.3 C)     Temp Source 08/15/19 1243 Oral     SpO2 08/15/19 1243 96 %     Weight --      Height --  Head Circumference --      Peak Flow --      Pain Score 08/15/19 1245 6     Pain Loc --      Pain Edu? --      Excl. in [place]? --    No data found.  Updated Vital Signs BP (!) 142/86    Pulse 98    Temp 99.2 F (37.3 C) (Oral)    Resp 18    SpO2 96%   Physical Exam Constitutional:      General: He is not in acute distress.    Appearance: He is well-developed. He is not diaphoretic.  HENT:     Head: Normocephalic and atraumatic.  Eyes:     Conjunctiva/sclera: Conjunctivae normal.     Pupils: Pupils are equal, round, and reactive to light.  Skin:    General: Skin is warm and dry.     Comments: 2cm x 2cm abscess with 10cm x 10cm surrounding cellulitis of left anterior mid shin. Tenderness to palpation.   Neurological:     Mental Status: He is alert and oriented to person, place, and time.      UC Treatments / Results  Labs (all labs ordered are listed, but only abnormal results are displayed) Labs Reviewed - No data to display  EKG   Radiology No results found.  Procedures Incision and Drainage  Date/Time: 08/15/2019 2:03 PM Performed by: Ok Edwards, PA-C Authorized by: Ok Edwards, PA-C   Consent:    Consent obtained:  Verbal   Consent given by:  Patient   Risks discussed:  Bleeding, incomplete drainage, pain, damage to  other organs and infection   Alternatives discussed:  No treatment, alternative treatment and referral Location:    Type:  Abscess   Size:  2cm x 2cm   Location:  Lower extremity   Lower extremity location:  Leg   Leg location:  L lower leg Pre-procedure details:    Skin preparation:  Chloraprep Anesthesia (see MAR for exact dosages):    Anesthesia method:  Local infiltration   Local anesthetic:  Lidocaine 2% WITH epi Procedure type:    Complexity:  Simple Procedure details:    Needle aspiration: no     Incision types:  Single straight   Incision depth:  Dermal   Scalpel blade:  11   Wound management:  Probed and deloculated   Drainage:  Purulent   Drainage amount:  Scant   Wound treatment:  Wound left open   Packing materials:  None Post-procedure details:    Patient tolerance of procedure:  Tolerated well, no immediate complications   (including critical care time)  Medications Ordered in UC Medications - No data to display  Initial Impression / Assessment and Plan / UC Course  I have reviewed the triage vital signs and the nursing notes.  Pertinent labs & imaging results that were available during my care of the patient were reviewed by me and considered in my medical decision making (see chart for details).    Patient tolerated procedure well. Start doxycycline for surrounding cellulitis. Wound care instructions given. Return precautions given. Patient expresses understanding and agrees to plan.   Final Clinical Impressions(s) / UC Diagnoses   Final diagnoses:  Cellulitis and abscess of leg   ED Prescriptions    Medication Sig Dispense Auth. Provider   doxycycline (VIBRAMYCIN) 100 MG capsule Take 1 capsule (100 mg total) by mouth 2 (two) times daily. 20 capsule Ok Edwards, PA-C  mupirocin ointment (BACTROBAN) 2 % Apply 1 application topically 2 (two) times daily. 22 g Ok Edwards, PA-C     PDMP not reviewed this encounter.   Ok Edwards, PA-C 08/15/19 1404

## 2019-08-15 NOTE — ED Notes (Signed)
Patient able to ambulate independently  

## 2019-08-26 ENCOUNTER — Emergency Department (HOSPITAL_COMMUNITY)
Admission: EM | Admit: 2019-08-26 | Discharge: 2019-08-26 | Disposition: A | Discharge status: Home / Self Care | Payer: Medicare HMO | Attending: Emergency Medicine | Admitting: Emergency Medicine

## 2019-08-26 ENCOUNTER — Encounter (HOSPITAL_COMMUNITY): Payer: Self-pay | Admitting: Emergency Medicine

## 2019-08-26 DIAGNOSIS — J45909 Unspecified asthma, uncomplicated: Secondary | ICD-10-CM | POA: Insufficient documentation

## 2019-08-26 DIAGNOSIS — F17210 Nicotine dependence, cigarettes, uncomplicated: Secondary | ICD-10-CM | POA: Diagnosis not present

## 2019-08-26 DIAGNOSIS — M545 Low back pain, unspecified: Secondary | ICD-10-CM

## 2019-08-26 MED ORDER — ONDANSETRON 4 MG PO TBDP
8.0000 mg | ORAL_TABLET | Freq: Once | ORAL | Status: AC
  Administered 2019-08-26: 8 mg via ORAL
  Filled 2019-08-26: qty 2

## 2019-08-26 MED ORDER — TIZANIDINE HCL 4 MG PO TABS: 4.0000 mg | ORAL_TABLET | Freq: Four times a day (QID) | ORAL | 0 refills | Status: DC | PRN

## 2019-08-26 MED ORDER — TIZANIDINE HCL 4 MG PO TABS
4.0000 mg | ORAL_TABLET | Freq: Once | ORAL | Status: AC
  Administered 2019-08-26: 4 mg via ORAL
  Filled 2019-08-26: qty 1

## 2019-08-26 MED ORDER — HYDROMORPHONE HCL 1 MG/ML IJ SOLN
1.0000 mg | Freq: Once | INTRAMUSCULAR | Status: AC
  Administered 2019-08-26: 16:00:00 1 mg via INTRAMUSCULAR
  Filled 2019-08-26: qty 1

## 2019-08-26 NOTE — ED Provider Notes (Signed)
Emergency Department Provider Note   I have reviewed the triage vital signs and the nursing notes.   HISTORY  Chief Complaint Back Pain   HPI [name] is a 43 y.o. male with multiple past medical problems documented below who received some spinal injections today around 930 by Dr. Posey Pronto.  Initially was fine but then started in pain about an hour later.  The pain continued and worsened to call Dr. Posey Pronto after taking oxycodone did not help.  Dr. Posey Pronto directed him here.  He has had intermittent episodes of perineal numbness but nothing prolonged.  No weakness in his legs.  No numbness in his legs.  No difficulty walking.  No bowel or bladder incontinence.   No recent infections.  No recent trauma.  No other associated symptoms.  No other associated or modifying symptoms.    Past Medical History:  Diagnosis Date  . Anxiety   . Asthma   . Bipolar 1 disorder ([place])   . Chronic back pain   . Complication of anesthesia    Anxiety when he awaken with ET tube still in place  . GERD (gastroesophageal reflux disease)   . Hyperlipidemia   . Hypoventilation syndrome   . Left testicular torsion    "20 years ago"  . Lumbar radiculopathy   . Migraines   . Schizoaffective disorder ([place])    [place]    Patient Active Problem List   Diagnosis Date Noted  . Chronic pain disorder 05/04/2019  . Reactive airway disease that is not asthma 05/04/2019  . Asthma 04/29/2019  . Auditory hallucinations 01/20/2019  . Rectal bleeding 04/14/2018  . Dysphagia 12/24/2017  . Schizophrenia ([place]) 05/28/2016  . Abdominal pain, epigastric 02/04/2013  . Constipation 12/02/2012  . Abdominal pain 11/22/2012  . INSOMNIA 04/26/2009  . BACK PAIN, LUMBAR 03/20/2009  . BIPOLAR AFFECTIVE DISORDER 02/17/2009  . OTHER ENTHESOPATHY OF ANKLE AND TARSUS 10/05/2008  . GERD 09/16/2008  . PERSONAL HISTORY OF SCHIZOPHRENIA 05/27/2008  . ESOPHAGITIS, REFLUX 03/23/2008  . HIATAL HERNIA 03/23/2008  .  Hyperlipidemia, mixed 01/07/2008  . MORBID OBESITY 01/07/2008  . ANXIETY DEPRESSION 01/07/2008  . TOBACCO ABUSE 01/07/2008  . HYPERTENSION 01/07/2008  . ALLERGIC RHINITIS 01/07/2008  . IBS 01/07/2008    Past Surgical History:  Procedure Laterality Date  . ANKLE SURGERY    . APPENDECTOMY    . BIOPSY  01/15/2018   Procedure: BIOPSY;  Surgeon: Daneil Dolin, MD;  [facility];  Service: Endoscopy;;  gastric  . CHOLECYSTECTOMY    . ESOPHAGOGASTRODUODENOSCOPY  03/17/2008   RMR: A tiny distal esophageal erosions consistent with mild  erosive reflux esophagitis, otherwise normal esophagus, small hiatal hernia, minimally polypoid antral mucosa with doubtful clinical was significance.  Otherwise, normal stomach, patent pylorus, and normal D1-D2  . ESOPHAGOGASTRODUODENOSCOPY (EGD) WITH PROPOFOL N/A 02/18/2013   Procedure: ESOPHAGOGASTRODUODENOSCOPY (EGD) WITH PROPOFOL;  Surgeon: Daneil Dolin, MD;  Location: [facility];  Service: Endoscopy;  Laterality: N/A;  . ESOPHAGOGASTRODUODENOSCOPY (EGD) WITH PROPOFOL N/A 01/15/2018   Procedure: ESOPHAGOGASTRODUODENOSCOPY (EGD) WITH PROPOFOL;  Surgeon: Daneil Dolin, MD;  [facility];  Service: Endoscopy;  Laterality: N/A;  2:15pm  . MALONEY DILATION N/A 01/15/2018   Procedure: Venia Minks DILATION;  Surgeon: Daneil Dolin, MD;  [facility];  Service: Endoscopy;  Laterality: N/A;  . NASAL SINUS SURGERY  01/24/2012   Procedure: ENDOSCOPIC SINUS SURGERY;  Surgeon: Izora Gala, MD;  Location: [facility];  Service: ENT;  Laterality:  Left;  left ethmoidectomy, maxillary, frontal  . UMBILICAL HERNIA REPAIR      Current Outpatient Rx  . Order #: [id number] Class: Normal  . Order #: [id number] Class: Normal  . Order #: [id number] Class: Normal  . Order #: [id number] Class: Normal  . Order #: [id number] Class: Normal  . Order #: [id number] Class: Normal  . Order #: [id number] Class: Normal  . Order #: [id number] Class: Historical Med  . Order #:  [id number] Class: Historical Med  . Order #: [id number] Class: Normal  . Order #: [id number] Class: Normal  . Order #: [id number] Class: Normal  . Order #: [id number] Class: Normal    Allergies Compazine, Imitrex [sumatriptan base], Ketorolac tromethamine, Prochlorperazine, Risperidone, Sumatriptan, Risperidone and related, and Risperidone  Family History  Problem Relation Age of Onset  . Anesthesia problems Mother   . Colon cancer Maternal Grandfather   . Crohn's disease Maternal Grandfather   . Gastric cancer Paternal Uncle   . Esophageal cancer Neg Hx     Social History Social History   Tobacco Use  . Smoking status: Current Every Day Smoker    Packs/day: 1.00    Years: 22.00    Pack years: 22.00    Types: Cigarettes  . Smokeless tobacco: Never Used  Substance Use Topics  . Alcohol use: Yes    Comment: occ  . Drug use: No    Review of Systems  All other systems negative except as documented in the HPI. All pertinent positives and negatives as reviewed in the HPI. ____________________________________________   PHYSICAL EXAM:  VITAL SIGNS: ED Triage Vitals [08/26/19 1256]  Enc Vitals Group     BP (!) 144/80     Pulse Rate 60     Resp 16     Temp 98.3 F (36.8 C)     Temp Source Oral     SpO2 99 %    Constitutional: Alert and oriented. Well appearing and in no acute distress. Eyes: Conjunctivae are normal. PERRL. EOMI. Head: Atraumatic. Nose: No congestion/rhinnorhea. Mouth/Throat: Mucous membranes are moist.  Oropharynx non-erythematous. Neck: No stridor.  No meningeal signs.   Cardiovascular: Normal rate, regular rhythm. Good peripheral circulation. Grossly normal heart sounds.   Respiratory: Normal respiratory effort.  No retractions. Lungs CTAB. Gastrointestinal: Soft and nontender. No distention.  Musculoskeletal: localized ttp to paraspinal muscles of back around injection site bandaids. Has significant allodynia in that area where it hurts as soon as it is  touched. Neurologic:  Normal speech and language. No gross focal neurologic deficits are appreciated.  Skin:  Skin is warm, dry and intact. No rash noted.  ____________________________________________   LABS (all labs ordered are listed, but only abnormal results are displayed)  Labs Reviewed - No data to display ____________________________________________  EKG   EKG Interpretation  Date/Time:    Ventricular Rate:    PR Interval:    QRS Duration:   QT Interval:    QTC Calculation:   R Axis:     Text Interpretation:         ____________________________________________  RADIOLOGY  No results found.  ____________________________________________   PROCEDURES  Procedure(s) performed:   Procedures   ____________________________________________   INITIAL IMPRESSION / ASSESSMENT AND PLAN / ED COURSE  Likely muscular pain, localized to area of shots. Will d/w Dr. Posey Pronto, but at this point, based on exam, no indication for emergent imaging. Will treat symptoms in meantime.   Dr. Posey Pronto stated that there was no way that he got you are close to the spinal cord or spinal nerve to cause  any kind of dermatomal neurologic symptoms.  Said the peroneal tingling and numbness was relatively normal and will dissipate over time.  Patient apparently has a history of exaggerating symptoms somewhat to him. Suggested muscle relaxers. Did not feel like, based on my description, any imaging was needed.  On reevaluation the patient does appear better and states he feels much better.  Is able to stand and ambulate without difficulty.  Patient discharged to follow-up as needed. After discussion with Dr. Posey Pronto, I have low suspicion for cauda equina or other central spinal process.   Pertinent labs & imaging results that were available during my care of the patient were reviewed by me and considered in my medical decision making (see chart for details).  A medical screening exam was performed  and I feel the patient has had an appropriate workup for their chief complaint at this time and likelihood of emergent condition existing is low. They have been counseled on decision, discharge, follow up and which symptoms necessitate immediate return to the emergency department. They or their family verbally stated understanding and agreement with plan and discharged in stable condition.   ____________________________________________  FINAL CLINICAL IMPRESSION(S) / ED DIAGNOSES  Final diagnoses:  Acute bilateral low back pain without sciatica     MEDICATIONS GIVEN DURING THIS VISIT:  Medications  HYDROmorphone (DILAUDID) injection 1 mg (1 mg Intramuscular Given 08/26/19 1537)  ondansetron (ZOFRAN-ODT) disintegrating tablet 8 mg (8 mg Oral Given 08/26/19 1537)  tiZANidine (ZANAFLEX) tablet 4 mg (4 mg Oral Given 08/26/19 1602)     NEW OUTPATIENT MEDICATIONS STARTED DURING THIS VISIT:  Discharge Medication List as of 08/26/2019  4:14 PM    START taking these medications   Details  tiZANidine (ZANAFLEX) 4 MG tablet Take 1 tablet (4 mg total) by mouth every 6 (six) hours as needed for muscle spasms., Starting Thu 08/26/2019, Normal        Note:  This note was prepared with assistance of Dragon voice recognition software. Occasional wrong-word or sound-a-like substitutions may have occurred due to the inherent limitations of voice recognition software.   Merrily Pew, MD 08/26/19 2318

## 2019-08-26 NOTE — ED Triage Notes (Addendum)
Pt arrives from home with c/o of back pain following a "nerve block" he had done about 2 hours ago. Pt states immediately after this procedure he began having worse pain and after going home he developed saddle numbness. No loss of bowel or bladder. Pt is able to walk but painful.

## 2019-08-26 NOTE — ED Notes (Signed)
Pt verbalized understanding of discharge paperwork and follow-up care.  °

## 2019-09-14 ENCOUNTER — Encounter (HOSPITAL_COMMUNITY): Payer: Self-pay | Admitting: Emergency Medicine

## 2019-09-14 ENCOUNTER — Emergency Department (HOSPITAL_COMMUNITY)
Admission: EM | Admit: 2019-09-14 | Discharge: 2019-09-14 | Disposition: A | Discharge status: Home / Self Care | Payer: Medicare HMO | Attending: Emergency Medicine | Admitting: Emergency Medicine

## 2019-09-14 ENCOUNTER — Emergency Department (HOSPITAL_COMMUNITY)
Admission: EM | Admit: 2019-09-14 | Discharge: 2019-09-14 | Discharge status: Against Medical Advice | Payer: Medicare HMO | Source: Home / Self Care

## 2019-09-14 ENCOUNTER — Emergency Department (HOSPITAL_COMMUNITY): Payer: Medicare HMO

## 2019-09-14 ENCOUNTER — Other Ambulatory Visit: Payer: Self-pay

## 2019-09-14 DIAGNOSIS — R0602 Shortness of breath: Secondary | ICD-10-CM | POA: Insufficient documentation

## 2019-09-14 DIAGNOSIS — Z5321 Procedure and treatment not carried out due to patient leaving prior to being seen by health care provider: Secondary | ICD-10-CM | POA: Insufficient documentation

## 2019-09-14 DIAGNOSIS — R110 Nausea: Secondary | ICD-10-CM | POA: Diagnosis not present

## 2019-09-14 DIAGNOSIS — M79602 Pain in left arm: Secondary | ICD-10-CM | POA: Insufficient documentation

## 2019-09-14 DIAGNOSIS — R079 Chest pain, unspecified: Secondary | ICD-10-CM | POA: Insufficient documentation

## 2019-09-14 LAB — CBC
HCT: 44.5 % (ref 39.0–52.0)
Hemoglobin: 15.7 g/dL (ref 13.0–17.0)
MCH: 31.2 pg (ref 26.0–34.0)
MCHC: 35.3 g/dL (ref 30.0–36.0)
MCV: 88.3 fL (ref 80.0–100.0)
Platelets: 218 10*3/uL (ref 150–400)
RBC: 5.04 MIL/uL (ref 4.22–5.81)
RDW: 13.1 % (ref 11.5–15.5)
WBC: 7.7 10*3/uL (ref 4.0–10.5)
nRBC: 0 % (ref 0.0–0.2)

## 2019-09-14 LAB — BASIC METABOLIC PANEL
Anion gap: 10 (ref 5–15)
BUN: <5 mg/dL — ABNORMAL LOW (ref 6–20)
CO2: 21 mmol/L — ABNORMAL LOW (ref 22–32)
Calcium: 8.9 mg/dL (ref 8.9–10.3)
Chloride: 108 mmol/L (ref 98–111)
Creatinine, Ser: 0.72 mg/dL (ref 0.61–1.24)
GFR calc Af Amer: >60 mL/min (ref >60)
GFR calc non Af Amer: >60 mL/min (ref >60)
Glucose, Bld: 88 mg/dL (ref 70–99)
Potassium: 3.4 mmol/L — ABNORMAL LOW (ref 3.5–5.1)
Sodium: 139 mmol/L (ref 135–145)

## 2019-09-14 LAB — TROPONIN I (HIGH SENSITIVITY): Troponin I (High Sensitivity): 44 ng/L — ABNORMAL HIGH (ref <18)

## 2019-09-14 MED ORDER — SODIUM CHLORIDE 0.9% FLUSH: 3.0000 mL | Freq: Once | INTRAVENOUS | Status: DC

## 2019-09-14 NOTE — ED Notes (Signed)
Pt to desk stating that he feels better and has an appointment with his doctor in the morning Pt LWBS and before triage by RN RN Ilene notified

## 2019-09-14 NOTE — ED Notes (Signed)
Pt told Xray he was leaving. Xray notified RN that pt was going to leave.

## 2019-09-14 NOTE — ED Triage Notes (Addendum)
Pt arrives via EMS with complaints of CP for about an hour. Sudden onset. Radiating to left arm- sharp in nature. Pt has some nausea and SOB as well. Pt was told he may have HTN but is not on meds. HR 88, sats 98%, BP 139/85, resp 24. EMS gave 1 nitro with no relief- gave him headache. Also 324mg  ASA. 4mg  zofran.

## 2019-09-19 ENCOUNTER — Encounter (HOSPITAL_COMMUNITY): Payer: Self-pay | Admitting: Emergency Medicine

## 2019-09-19 ENCOUNTER — Emergency Department (HOSPITAL_COMMUNITY)
Admission: EM | Admit: 2019-09-19 | Discharge: 2019-09-19 | Disposition: A | Discharge status: Home / Self Care | Payer: Medicare HMO | Attending: Emergency Medicine | Admitting: Emergency Medicine

## 2019-09-19 ENCOUNTER — Other Ambulatory Visit: Payer: Self-pay

## 2019-09-19 DIAGNOSIS — I10 Essential (primary) hypertension: Secondary | ICD-10-CM | POA: Diagnosis not present

## 2019-09-19 DIAGNOSIS — M545 Low back pain: Secondary | ICD-10-CM | POA: Diagnosis not present

## 2019-09-19 DIAGNOSIS — F17210 Nicotine dependence, cigarettes, uncomplicated: Secondary | ICD-10-CM | POA: Diagnosis not present

## 2019-09-19 DIAGNOSIS — M549 Dorsalgia, unspecified: Secondary | ICD-10-CM | POA: Diagnosis present

## 2019-09-19 DIAGNOSIS — G8929 Other chronic pain: Secondary | ICD-10-CM | POA: Insufficient documentation

## 2019-09-19 DIAGNOSIS — Z79899 Other long term (current) drug therapy: Secondary | ICD-10-CM | POA: Insufficient documentation

## 2019-09-19 DIAGNOSIS — J45909 Unspecified asthma, uncomplicated: Secondary | ICD-10-CM | POA: Insufficient documentation

## 2019-09-19 MED ORDER — HYDROMORPHONE HCL 1 MG/ML IJ SOLN
1.0000 mg | Freq: Once | INTRAMUSCULAR | Status: AC
  Administered 2019-09-19: 1 mg via INTRAMUSCULAR
  Filled 2019-09-19: qty 1

## 2019-09-19 MED ORDER — ONDANSETRON 4 MG PO TBDP
8.0000 mg | ORAL_TABLET | Freq: Once | ORAL | Status: AC
  Administered 2019-09-19: 8 mg via ORAL
  Filled 2019-09-19: qty 2

## 2019-09-19 NOTE — ED Triage Notes (Addendum)
Pt c/o severe back pain, pt states he had MRI recently. Pt states he has Robaxin, Tramadol, Neurontin and Ibuprofen at home with no relief, pt states he was told to come to ED by PCP. Pt requesting the medication that he has been given up his nose to wait in the [place].

## 2019-09-19 NOTE — Discharge Instructions (Addendum)
Follow up with your doctor

## 2019-09-19 NOTE — ED Provider Notes (Signed)
[facility] EMERGENCY DEPARTMENT Provider Note   CSN: [id number] Arrival date & time: 09/19/19  1925     History   Chief Complaint Chief Complaint  Patient presents with  . Back Pain    HPI [name] is a 43 y.o. male.     HPI [name] is a 43 y.o. male presents to emergency department complaining of back pain.  Patient with chronic back pain, currently on tramadol, baclofen, gabapentin.  Patient states it is not helping.  He states tonight the pain has gotten worse.  He denies any injuries.  No pain radiating down his extremities.  No numbness or weakness to his extremities.  He states he even found a Percocet in his house and took it which did not give him any relief.  He called his doctor who told him to go to the ER if pain is getting worse.  Patient reports similar exacerbations in the past which required IM injection of pain medications.  Patient denies abdominal pain.  Denies any fever or chills.  No IV drug use.  No other complaints.  Past Medical History:  Diagnosis Date  . Anxiety   . Asthma   . Bipolar 1 disorder ([place])   . Chronic back pain   . Complication of anesthesia    Anxiety when he awaken with ET tube still in place  . GERD (gastroesophageal reflux disease)   . Hyperlipidemia   . Hypoventilation syndrome   . Left testicular torsion    "20 years ago"  . Lumbar radiculopathy   . Migraines   . Schizoaffective disorder ([place])    [place]    Patient Active Problem List   Diagnosis Date Noted  . Chronic pain disorder 05/04/2019  . Reactive airway disease that is not asthma 05/04/2019  . Asthma 04/29/2019  . Auditory hallucinations 01/20/2019  . Rectal bleeding 04/14/2018  . Dysphagia 12/24/2017  . Schizophrenia ([place]) 05/28/2016  . Abdominal pain, epigastric 02/04/2013  . Constipation 12/02/2012  . Abdominal pain 11/22/2012  . INSOMNIA 04/26/2009  . BACK PAIN, LUMBAR 03/20/2009  . BIPOLAR AFFECTIVE DISORDER  02/17/2009  . OTHER ENTHESOPATHY OF ANKLE AND TARSUS 10/05/2008  . GERD 09/16/2008  . PERSONAL HISTORY OF SCHIZOPHRENIA 05/27/2008  . ESOPHAGITIS, REFLUX 03/23/2008  . HIATAL HERNIA 03/23/2008  . Hyperlipidemia, mixed 01/07/2008  . MORBID OBESITY 01/07/2008  . ANXIETY DEPRESSION 01/07/2008  . TOBACCO ABUSE 01/07/2008  . HYPERTENSION 01/07/2008  . ALLERGIC RHINITIS 01/07/2008  . IBS 01/07/2008    Past Surgical History:  Procedure Laterality Date  . ANKLE SURGERY    . APPENDECTOMY    . BIOPSY  01/15/2018   Procedure: BIOPSY;  Surgeon: Daneil Dolin, MD;  [facility];  Service: Endoscopy;;  gastric  . CHOLECYSTECTOMY    . ESOPHAGOGASTRODUODENOSCOPY  03/17/2008   RMR: A tiny distal esophageal erosions consistent with mild  erosive reflux esophagitis, otherwise normal esophagus, small hiatal hernia, minimally polypoid antral mucosa with doubtful clinical was significance.  Otherwise, normal stomach, patent pylorus, and normal D1-D2  . ESOPHAGOGASTRODUODENOSCOPY (EGD) WITH PROPOFOL N/A 02/18/2013   Procedure: ESOPHAGOGASTRODUODENOSCOPY (EGD) WITH PROPOFOL;  Surgeon: Daneil Dolin, MD;  Location[facility];  Service: Endoscopy;  Laterality: N/A;  . ESOPHAGOGASTRODUODENOSCOPY (EGD) WITH PROPOFOL N/A 01/15/2018   Procedure: ESOPHAGOGASTRODUODENOSCOPY (EGD) WITH PROPOFOL;  Surgeon: Daneil Dolin, MD;  [facility];  Service: Endoscopy;  Laterality: N/A;  2:15pm  . MALONEY DILATION N/A 01/15/2018   Procedure: Venia Minks  DILATION;  Surgeon: Daneil Dolin, MD;  [facility];  Service: Endoscopy;  Laterality: N/A;  . NASAL SINUS SURGERY  01/24/2012   Procedure: ENDOSCOPIC SINUS SURGERY;  Surgeon: Izora Gala, MD;  Location: [facility];  Service: ENT;  Laterality: Left;  left ethmoidectomy, maxillary, frontal  . UMBILICAL HERNIA REPAIR          Home Medications    Prior to Admission medications   Medication Sig Start Date End Date Taking? Authorizing Provider   albuterol (PROVENTIL) (2.5 MG/3ML) 0.083% nebulizer solution Take 3 mLs (2.5 mg total) by nebulization every 4 (four) hours as needed for wheezing or shortness of breath. Patient not taking: Reported on 07/21/2019 04/29/19   Laurey Morale, MD  albuterol (VENTOLIN HFA) 108 (90 Base) MCG/ACT inhaler INHALE 2 PUFFS INTO THE LUNGS EVERY 6 HOURS AS NEEDED FOR WHEEZING OR SHORTNESS OF BREATH Patient taking differently: Inhale 2 puffs into the lungs every 6 (six) hours as needed for wheezing or shortness of breath.  04/27/19   [place], Betty G, MD  ALPRAZolam Duanne Moron) 1 MG tablet Take 1 tablet (1 mg total) by mouth 2 (two) times daily as needed for anxiety. Patient taking differently: Take 1 mg by mouth 3 (three) times daily as needed for anxiety.  05/06/19   [place], Betty G, MD  ARIPiprazole (ABILIFY) 20 MG tablet Take 1 tablet (20 mg total) by mouth 2 (two) times daily. Patient not taking: Reported on 05/31/2019 04/09/19   Laurey Morale, MD  doxycycline (VIBRAMYCIN) 100 MG capsule Take 1 capsule (100 mg total) by mouth 2 (two) times daily. 08/15/19   Tasia Catchings, Amy V, PA-C  Fluticasone-Salmeterol (ADVAIR DISKUS) 250-50 MCG/DOSE AEPB Inhale 1 puff into the lungs 2 (two) times daily. 05/04/19   [place], Betty G, MD  gabapentin (NEURONTIN) 300 MG capsule Take 1 capsule (300 mg total) by mouth 3 (three) times daily. 04/16/19   [place], Betty G, MD  meloxicam (MOBIC) 15 MG tablet Take 15 mg by mouth daily.    [provider]  methylphenidate (RITALIN) 20 MG tablet Take 1 tablet by mouth daily. 07/07/19   [provider]  mupirocin ointment (BACTROBAN) 2 % Apply 1 application topically 2 (two) times daily. 08/15/19   Tasia Catchings, Amy V, PA-C  pantoprazole (PROTONIX) 40 MG tablet Take 1 tablet (40 mg total) by mouth daily. 03/29/19   [place], Betty G, MD  tiZANidine (ZANAFLEX) 4 MG tablet Take 1 tablet (4 mg total) by mouth every 6 (six) hours as needed for muscle spasms. 08/26/19   Mesner, Corene Cornea, MD  venlafaxine XR (EFFEXOR  XR) 75 MG 24 hr capsule Take 1 capsule (75 mg total) by mouth daily. Patient not taking: Reported on 05/18/2019 04/09/19   Laurey Morale, MD    Family History Family History  Problem Relation Age of Onset  . Anesthesia problems Mother   . Colon cancer Maternal Grandfather   . Crohn's disease Maternal Grandfather   . Gastric cancer Paternal Uncle   . Esophageal cancer Neg Hx     Social History Social History   Tobacco Use  . Smoking status: Current Every Day Smoker    Packs/day: 1.00    Years: 22.00    Pack years: 22.00    Types: Cigarettes  . Smokeless tobacco: Never Used  Substance Use Topics  . Alcohol use: Yes    Comment: occ  . Drug use: No     Allergies   Compazine, Imitrex [sumatriptan base], Ketorolac tromethamine, Prochlorperazine,  Risperidone, Sumatriptan, Risperidone and related, and Risperidone   Review of Systems Review of Systems  Constitutional: Negative for chills and fever.  Respiratory: Negative for cough, chest tightness and shortness of breath.   Cardiovascular: Negative for chest pain, palpitations and leg swelling.  Gastrointestinal: Negative for abdominal distention, abdominal pain, diarrhea, nausea and vomiting.  Genitourinary: Negative for dysuria, frequency, hematuria and urgency.  Musculoskeletal: Positive for back pain. Negative for arthralgias and myalgias.  Skin: Negative for rash.  Allergic/Immunologic: Negative for immunocompromised state.  Neurological: Negative for weakness and numbness.     Physical Exam Updated Vital Signs BP 134/65 (BP Location: Right Arm)   Pulse 70   Temp 98 F (36.7 C) (Oral)   Resp 18   Wt 127 kg   SpO2 96%   BMI 35.00 kg/m   Physical Exam Vitals signs and nursing note reviewed.  Constitutional:      General: He is not in acute distress.    Appearance: He is well-developed.  HENT:     Head: Normocephalic and atraumatic.  Eyes:     Conjunctiva/sclera: Conjunctivae normal.  Neck:      Musculoskeletal: Neck supple.  Abdominal:     General: Bowel sounds are normal. There is no distension.     Palpations: Abdomen is soft.     Tenderness: There is no abdominal tenderness. There is no rebound.  Musculoskeletal:     Comments: Tenderness to palpation over midline lumbar spine and bilateral paraspinal muscles.  No pain with bilateral straight leg raise.  Skin:    General: Skin is warm and dry.  Neurological:     Mental Status: He is alert.     Comments: 5/5 and equal lower extremity strength. 2+ and equal patellar reflexes bilaterally. Pt able to dorsiflex bilateral toes and feet with good strength against resistance. Equal sensation bilaterally over thighs and lower legs.       ED Treatments / Results  Labs (all labs ordered are listed, but only abnormal results are displayed) Labs Reviewed - No data to display  EKG None  Radiology No results found.  Procedures Procedures (including critical care time)  Medications Ordered in ED Medications  HYDROmorphone (DILAUDID) injection 1 mg (has no administration in time range)  ondansetron (ZOFRAN-ODT) disintegrating tablet 8 mg (has no administration in time range)     Initial Impression / Assessment and Plan / ED Course  I have reviewed the triage vital signs and the nursing notes.  Pertinent labs & imaging results that were available during my care of the patient were reviewed by me and considered in my medical decision making (see chart for details).        Patient with acute on chronic exacerbation of lower back pain.  No fever or chills.  No abdominal pain.  No IV drug use.  No neurological deficits.  No red flags to suggest cauda equina today.  No urinary symptoms.  Treated with 1 mg of Dilaudid IM and Zofran for nausea.  He will call his doctor tomorrow.  Advised to return as needed.  Vitals:   09/19/19 2039 09/19/19 2125 09/19/19 2139  BP: (!) 143/63  134/65  Pulse: 86  70  Resp: 19  18  Temp: 98.9  F (37.2 C)  98 F (36.7 C)  TempSrc: Oral  Oral  SpO2: 100%  96%  Weight:  127 kg      Final Clinical Impressions(s) / ED Diagnoses   Final diagnoses:  Chronic midline low back pain  without sciatica    ED Discharge Orders    None       Jeannett Senior, [place] 09/19/19 2234    Gareth Morgan, MD 09/21/19 1308

## 2019-09-22 ENCOUNTER — Emergency Department (HOSPITAL_COMMUNITY)
Admission: EM | Admit: 2019-09-22 | Discharge: 2019-09-22 | Disposition: A | Discharge status: Home / Self Care | Payer: Medicare HMO | Attending: Emergency Medicine | Admitting: Emergency Medicine

## 2019-09-22 ENCOUNTER — Encounter (HOSPITAL_COMMUNITY): Payer: Self-pay

## 2019-09-22 ENCOUNTER — Other Ambulatory Visit: Payer: Self-pay

## 2019-09-22 ENCOUNTER — Emergency Department (HOSPITAL_COMMUNITY): Payer: Medicare HMO

## 2019-09-22 DIAGNOSIS — J069 Acute upper respiratory infection, unspecified: Secondary | ICD-10-CM | POA: Insufficient documentation

## 2019-09-22 DIAGNOSIS — R05 Cough: Secondary | ICD-10-CM | POA: Diagnosis present

## 2019-09-22 DIAGNOSIS — R0602 Shortness of breath: Secondary | ICD-10-CM | POA: Diagnosis not present

## 2019-09-22 DIAGNOSIS — J45909 Unspecified asthma, uncomplicated: Secondary | ICD-10-CM | POA: Diagnosis not present

## 2019-09-22 DIAGNOSIS — R0981 Nasal congestion: Secondary | ICD-10-CM | POA: Diagnosis not present

## 2019-09-22 DIAGNOSIS — F17210 Nicotine dependence, cigarettes, uncomplicated: Secondary | ICD-10-CM | POA: Diagnosis not present

## 2019-09-22 DIAGNOSIS — M549 Dorsalgia, unspecified: Secondary | ICD-10-CM | POA: Insufficient documentation

## 2019-09-22 DIAGNOSIS — Z79899 Other long term (current) drug therapy: Secondary | ICD-10-CM | POA: Diagnosis not present

## 2019-09-22 DIAGNOSIS — G8929 Other chronic pain: Secondary | ICD-10-CM | POA: Diagnosis not present

## 2019-09-22 MED ORDER — PREDNISONE 10 MG PO TABS: 40.0000 mg | ORAL_TABLET | Freq: Every day | ORAL | 0 refills | Status: AC

## 2019-09-22 MED ORDER — PREDNISONE 20 MG PO TABS
60.0000 mg | ORAL_TABLET | Freq: Once | ORAL | Status: AC
  Administered 2019-09-22: 60 mg via ORAL
  Filled 2019-09-22: qty 3

## 2019-09-22 NOTE — ED Notes (Signed)
Pt ambulatory from triage with steady gait. No assistance needed.

## 2019-09-22 NOTE — ED Notes (Signed)
PT TOLD STAFF HE IS WAITING OUTSIDE

## 2019-09-22 NOTE — ED Triage Notes (Addendum)
Pt coming from home c/o shortness of breath, congestion and fever. Unknown exposure but works at [employer]. Taking tylenol for fever at home. Also c/o chronic back pain. Hx of asthma. Has used inhaler with minimal relief

## 2019-09-22 NOTE — Discharge Instructions (Addendum)
Quarantine until you know your Covid test results.  If your test result is negative, recommend repeat test at outpatient facility if you are still feeling unwell. Follow-up with your doctor as discussed. Take prednisone as prescribed.

## 2019-09-22 NOTE — ED Notes (Signed)
Pt refused COVID swab. ED PA made aware.

## 2019-09-22 NOTE — ED Notes (Signed)
Pt provided with coke and crackers on discharge. Pt ambulatory with steady gait.

## 2019-09-22 NOTE — ED Provider Notes (Addendum)
[facility] DEPT Provider Note   CSN: [id number] Arrival date & time: 09/22/19  1947     History   Chief Complaint Chief Complaint  Patient presents with   Shortness of Breath    HPI [name] is a 43 y.o. male.     43yo male with past medical history of asthma, chronic back pain, daily smoker, presents with complaint of acute of chronic back pain as well as cough and congestion. Patient states he called his pain management doctor today regarding his appointment tomorrow and was told to come to the ER for his back pain as well as screening for COVID. Patient reports 3 days ago of cough, congestion, mild sore throat, SHOB- has used his inhaler 3-4 times today compared to his usual 1 time daily. Reports fevers and chills, max temp 100.3. No known sick contacts however states he worked at [employer], last worked 3 days ago, unknown if he was in contact with anyone sick that day. No recent steroid use, no history of intubation related to his asthma. No other complaints or concerns.      Past Medical History:  Diagnosis Date   Anxiety    Asthma    Bipolar 1 disorder ([place])    Chronic back pain    Complication of anesthesia    Anxiety when he awaken with ET tube still in place   GERD (gastroesophageal reflux disease)    Hyperlipidemia    Hypoventilation syndrome    Left testicular torsion    "20 years ago"   Lumbar radiculopathy    Migraines    Schizoaffective disorder ([place])    [place]    Patient Active Problem List   Diagnosis Date Noted   Chronic pain disorder 05/04/2019   Reactive airway disease that is not asthma 05/04/2019   Asthma 04/29/2019   Auditory hallucinations 01/20/2019   Rectal bleeding 04/14/2018   Dysphagia 12/24/2017   Schizophrenia ([place]) 05/28/2016   Abdominal pain, epigastric 02/04/2013   Constipation 12/02/2012   Abdominal pain 11/22/2012   INSOMNIA 04/26/2009     BACK PAIN, LUMBAR 03/20/2009   BIPOLAR AFFECTIVE DISORDER 02/17/2009   OTHER ENTHESOPATHY OF ANKLE AND TARSUS 10/05/2008   GERD 09/16/2008   PERSONAL HISTORY OF SCHIZOPHRENIA 05/27/2008   ESOPHAGITIS, REFLUX 03/23/2008   HIATAL HERNIA 03/23/2008   Hyperlipidemia, mixed 01/07/2008   MORBID OBESITY 01/07/2008   ANXIETY DEPRESSION 01/07/2008   TOBACCO ABUSE 01/07/2008   HYPERTENSION 01/07/2008   ALLERGIC RHINITIS 01/07/2008   IBS 01/07/2008    Past Surgical History:  Procedure Laterality Date   ANKLE SURGERY     APPENDECTOMY     BIOPSY  01/15/2018   Procedure: BIOPSY;  Surgeon: Daneil Dolin, MD;  [facility];  Service: Endoscopy;;  gastric   CHOLECYSTECTOMY     ESOPHAGOGASTRODUODENOSCOPY  03/17/2008   RMR: A tiny distal esophageal erosions consistent with mild  erosive reflux esophagitis, otherwise normal esophagus, small hiatal hernia, minimally polypoid antral mucosa with doubtful clinical was significance.  Otherwise, normal stomach, patent pylorus, and normal D1-D2   ESOPHAGOGASTRODUODENOSCOPY (EGD) WITH PROPOFOL N/A 02/18/2013   Procedure: ESOPHAGOGASTRODUODENOSCOPY (EGD) WITH PROPOFOL;  Surgeon: Daneil Dolin, MD;  Location: [facility];  Service: Endoscopy;  Laterality: N/A;   ESOPHAGOGASTRODUODENOSCOPY (EGD) WITH PROPOFOL N/A 01/15/2018   Procedure: ESOPHAGOGASTRODUODENOSCOPY (EGD) WITH PROPOFOL;  Surgeon: Daneil Dolin, MD;  [facility];  Service: Endoscopy;  Laterality: N/A;  2:15pm   MALONEY DILATION  N/A 01/15/2018   Procedure: Venia Minks DILATION;  Surgeon: Daneil Dolin, MD;  [facility];  Service: Endoscopy;  Laterality: N/A;   NASAL SINUS SURGERY  01/24/2012   Procedure: ENDOSCOPIC SINUS SURGERY;  Surgeon: Izora Gala, MD;  Location: [facility];  Service: ENT;  Laterality: Left;  left ethmoidectomy, maxillary, frontal   UMBILICAL HERNIA REPAIR          Home Medications    Prior to Admission medications    Medication Sig Start Date End Date Taking? Authorizing Provider  albuterol (PROVENTIL) (2.5 MG/3ML) 0.083% nebulizer solution Take 3 mLs (2.5 mg total) by nebulization every 4 (four) hours as needed for wheezing or shortness of breath. Patient not taking: Reported on 07/21/2019 04/29/19   Laurey Morale, MD  albuterol (VENTOLIN HFA) 108 (90 Base) MCG/ACT inhaler INHALE 2 PUFFS INTO THE LUNGS EVERY 6 HOURS AS NEEDED FOR WHEEZING OR SHORTNESS OF BREATH Patient taking differently: Inhale 2 puffs into the lungs every 6 (six) hours as needed for wheezing or shortness of breath.  04/27/19   [place], Betty G, MD  ALPRAZolam Duanne Moron) 1 MG tablet Take 1 tablet (1 mg total) by mouth 2 (two) times daily as needed for anxiety. Patient taking differently: Take 1 mg by mouth 3 (three) times daily as needed for anxiety.  05/06/19   [place], Betty G, MD  ARIPiprazole (ABILIFY) 20 MG tablet Take 1 tablet (20 mg total) by mouth 2 (two) times daily. Patient not taking: Reported on 05/31/2019 04/09/19   Laurey Morale, MD  doxycycline (VIBRAMYCIN) 100 MG capsule Take 1 capsule (100 mg total) by mouth 2 (two) times daily. 08/15/19   Tasia Catchings, Amy V, PA-C  Fluticasone-Salmeterol (ADVAIR DISKUS) 250-50 MCG/DOSE AEPB Inhale 1 puff into the lungs 2 (two) times daily. 05/04/19   [place], Betty G, MD  gabapentin (NEURONTIN) 300 MG capsule Take 1 capsule (300 mg total) by mouth 3 (three) times daily. 04/16/19   [place], Betty G, MD  meloxicam (MOBIC) 15 MG tablet Take 15 mg by mouth daily.    [provider]  methylphenidate (RITALIN) 20 MG tablet Take 1 tablet by mouth daily. 07/07/19   [provider]  mupirocin ointment (BACTROBAN) 2 % Apply 1 application topically 2 (two) times daily. 08/15/19   Tasia Catchings, Amy V, PA-C  pantoprazole (PROTONIX) 40 MG tablet Take 1 tablet (40 mg total) by mouth daily. 03/29/19   [place], Betty G, MD  predniSONE (DELTASONE) 10 MG tablet Take 4 tablets (40 mg total) by mouth daily for 5 days. 09/22/19  09/27/19  Tacy Learn, PA-C  tiZANidine (ZANAFLEX) 4 MG tablet Take 1 tablet (4 mg total) by mouth every 6 (six) hours as needed for muscle spasms. 08/26/19   Mesner, Corene Cornea, MD  venlafaxine XR (EFFEXOR XR) 75 MG 24 hr capsule Take 1 capsule (75 mg total) by mouth daily. Patient not taking: Reported on 05/18/2019 04/09/19   Laurey Morale, MD    Family History Family History  Problem Relation Age of Onset   Anesthesia problems Mother    Colon cancer Maternal Grandfather    Crohn's disease Maternal Grandfather    Gastric cancer Paternal Uncle    Esophageal cancer Neg Hx     Social History Social History   Tobacco Use   Smoking status: Current Every Day Smoker    Packs/day: 1.00    Years: 22.00    Pack years: 22.00    Types: Cigarettes   Smokeless tobacco: Never Used  Substance  Use Topics   Alcohol use: Yes    Comment: occ   Drug use: No     Allergies   Compazine, Imitrex [sumatriptan base], Ketorolac tromethamine, Prochlorperazine, Risperidone, Sumatriptan, Risperidone and related, and Risperidone   Review of Systems Review of Systems  Constitutional: Positive for chills and fever.  HENT: Positive for congestion and sore throat. Negative for ear pain, rhinorrhea, sinus pressure, sinus pain and sneezing.   Eyes: Negative for discharge and redness.  Respiratory: Positive for cough, shortness of breath and wheezing.   Gastrointestinal: Negative for constipation, diarrhea, nausea and vomiting.  Musculoskeletal: Positive for back pain.  Skin: Negative for rash and wound.  Allergic/Immunologic: Negative for immunocompromised state.  Neurological: Negative for weakness.  All other systems reviewed and are negative.    Physical Exam Updated Vital Signs BP (!) 148/75    Pulse 83    Temp 98.3 F (36.8 C) (Oral)    Resp 16    Ht 6\' 3"  (1.905 m)    Wt 135.6 kg    SpO2 94%    BMI 37.37 kg/m   Physical Exam Vitals signs and nursing note reviewed.  Constitutional:       General: He is not in acute distress.    Appearance: He is well-developed. He is not diaphoretic.  HENT:     Head: Normocephalic and atraumatic.  Neck:     Musculoskeletal: Neck supple.  Cardiovascular:     Rate and Rhythm: Normal rate and regular rhythm.  Pulmonary:     Effort: Pulmonary effort is normal.     Breath sounds: Normal breath sounds. No decreased breath sounds or wheezing.  Musculoskeletal:     Right lower leg: No edema.     Left lower leg: No edema.  Skin:    General: Skin is warm and dry.  Neurological:     Mental Status: He is alert and oriented to person, place, and time.  Psychiatric:        Behavior: Behavior normal.      ED Treatments / Results  Labs (all labs ordered are listed, but only abnormal results are displayed) Labs Reviewed  SARS CORONAVIRUS 2 (TAT 6-24 HRS)    EKG None  Radiology Dg Chest Port 1 View  Result Date: 09/22/2019 CLINICAL DATA:  Shortness of breath EXAM: PORTABLE CHEST 1 VIEW COMPARISON:  September 14, 2019 FINDINGS: There is cardiomegaly. No focal infiltrate. No large pleural effusion. No evidence for pulmonary edema. No pneumothorax. There is no acute osseous abnormality detected. IMPRESSION: No active disease. Electronically Signed   By: Constance Holster M.D.   On: 09/22/2019 23:36    Procedures Procedures (including critical care time)  Medications Ordered in ED Medications  predniSONE (DELTASONE) tablet 60 mg (60 mg Oral Given 09/22/19 2347)     Initial Impression / Assessment and Plan / ED Course  I have reviewed the triage vital signs and the nursing notes.  Pertinent labs & imaging results that were available during my care of the patient were reviewed by me and considered in my medical decision making (see chart for details).  Clinical Course as of Sep 21 2358  Wed Sep 22, 2019  2343 [name] was evaluated in Emergency Department on 09/22/2019 for the symptoms described in the history of  present illness. He was evaluated in the context of the global COVID-19 pandemic, which necessitated consideration that the patient might be at risk for infection with the SARS-CoV-2 virus that causes COVID-19. Institutional protocols and  algorithms that pertain to the evaluation of patients at risk for COVID-19 are in a state of rapid change based on information released by regulatory bodies including the CDC and federal and state organizations. These policies and algorithms were followed during the patient's care in the ED.     [LM]  5732 43 year old male with URI symptoms x3 days, reports max temp of 100.3.  Chest x-ray is unremarkable, patient is well-appearing with clear lung sounds, vital signs are reassuring.  Patient states has been using his albuterol inhaler more frequently for the past few days.  Patient was given dose of prednisone while in the ER with prescription for short course of same.  Regarding patient's acute on chronic back pain, patient was treated in the ER for same 3 days ago, recommend he follow-up with his pain management provider.  Covid test sent out, advised to quarantine pending test results.   [LM]  2358 Patient had reported coming to the ER to be tested for COVID. When patient's nurse went to swab him for COVID he declined the test and reported only wanting pain medicine for his back. Discharge plan unchanged- given prednisone with rx for same.    [LM]    Clinical Course User Index [LM] Tacy Learn, PA-C      Final Clinical Impressions(s) / ED Diagnoses   Final diagnoses:  Viral upper respiratory tract infection    ED Discharge Orders         Ordered    predniSONE (DELTASONE) 10 MG tablet  Daily     09/22/19 2342           Tacy Learn, PA-C 09/22/19 2344    Tacy Learn, PA-C 09/23/19 0000    M[place], Jenny Reichmann, MD 09/23/19 [phone]

## 2019-09-28 ENCOUNTER — Encounter (HOSPITAL_COMMUNITY): Payer: Self-pay

## 2019-09-28 ENCOUNTER — Ambulatory Visit (HOSPITAL_COMMUNITY)
Admission: EM | Admit: 2019-09-28 | Discharge: 2019-09-28 | Disposition: A | Discharge status: Home / Self Care | Payer: Medicare HMO | Attending: Family Medicine | Admitting: Family Medicine

## 2019-09-28 DIAGNOSIS — M62830 Muscle spasm of back: Secondary | ICD-10-CM | POA: Diagnosis not present

## 2019-09-28 MED ORDER — BACLOFEN 20 MG PO TABS: 20.0000 mg | ORAL_TABLET | Freq: Three times a day (TID) | ORAL | 0 refills | Status: DC | PRN

## 2019-09-28 NOTE — ED Triage Notes (Signed)
Pt presents to the UC with chronic back pain. Pt states his back pain got worse last night and he was not able to see his Dr today.   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

## 2019-09-29 NOTE — ED Provider Notes (Signed)
[facility]   [id number] 09/28/19 Arrival Time: [id number]  ASSESSMENT & PLAN:  1. Muscle spasm of back   Acute on chronic.  Able to ambulate here and hemodynamically stable. No indication for imaging of back at this time given no trauma and normal neurological exam. Discussed.  Begin: Meds ordered this encounter  Medications  . baclofen (LIORESAL) 20 MG tablet    Sig: Take 1 tablet (20 mg total) by mouth 3 (three) times daily as needed for muscle spasms.    Dispense:  30 each    Refill:  0  Has used before with relief of symptoms.  Encourage ROM/movement as tolerated.  May f/u here as needed. Return to work note provided.  Reviewed expectations re: course of current medical issues. Questions answered. Outlined signs and symptoms indicating need for more acute intervention. Patient verbalized understanding. After Visit Summary given.   SUBJECTIVE: History from: patient.  [name] is a 43 y.o. male who presents with complaint of intermittent bilateral lower back discomfort. "Have spasms of my back pretty frequently". Onset gradual. Exacerbation noted yesterday. A little better today. Injury/trama: no. History of back problems requiring medical care: frequent. Pain described as sharp and stiffness and without radiation. Pain is worse with prolonged walking/standing, worse with movements involving back, and better with rest. Progressive LE weakness or saddle anesthesia: none. Extremity sensation changes or weakness: none. Ambulatory without difficulty. Normal bowel/bladder habits: yes; without urinary retention. Normal PO intake without n/v. No associated abdominal pain/n/v. Self treatment: has tried OTC analgesics with mild relief.  Reports no chronic steroid use, fevers, IV drug use, or recent back surgeries or procedures.  ROS: As per HPI. All other systems negative.   OBJECTIVE:  Vitals:   09/28/19 0940  BP: 114/69  Pulse: 90  Resp: 18  Temp: 98.8  F (37.1 C)  TempSrc: Oral  SpO2: 96%    General appearance: alert; no distress HEENT: [place]; AT Neck: supple with FROM; without midline tenderness CV: RRR Lungs: unlabored respirations; symmetrical air entry Abdomen: soft, non-tender; non-distended Back: mild  and poorly localized tenderness to palpation over bilateral lumbar paraspinal musculature; FROM at waist; bruising: none; without midline tenderness Extremities: without edema; symmetrical without gross deformities; normal ROM of bilateral LE Skin: warm and dry Neurologic: normal gait; normal reflexes of bilateral LE; normal sensation of bilateral LE; normal strength of bilateral LE Psychological: alert and cooperative; normal mood and affect   Allergies  Allergen Reactions  . Compazine Shortness Of Breath    Had to be intubated  . Imitrex [Sumatriptan Base] Swelling    Facial swelling  . Ketorolac Tromethamine Anaphylaxis  . Prochlorperazine Anaphylaxis  . Risperidone Other (See Comments)  . Sumatriptan Other (See Comments)  . Lovastatin Other (See Comments)  . Risperidone And Related Other (See Comments)    Shaking and insomnia  . Risperidone Rash    Past Medical History:  Diagnosis Date  . Anxiety   . Asthma   . Bipolar 1 disorder ([place])   . Chronic back pain   . Complication of anesthesia    Anxiety when he awaken with ET tube still in place  . GERD (gastroesophageal reflux disease)   . Hyperlipidemia   . Hypoventilation syndrome   . Left testicular torsion    "20 years ago"  . Lumbar radiculopathy   . Migraines   . Schizoaffective disorder ([place])    [place]   Social History   Socioeconomic History  . Marital status: Divorced  Spouse name: Not on file  . Number of children: Not on file  . Years of education: Not on file  . Highest education level: Not on file  Occupational History  . Not on file  Social Needs  . Financial resource strain: Not on file  . Food insecurity    Worry: Not on file     Inability: Not on file  . Transportation needs    Medical: Not on file    Non-medical: Not on file  Tobacco Use  . Smoking status: Current Every Day Smoker    Packs/day: 1.00    Years: 22.00    Pack years: 22.00    Types: Cigarettes  . Smokeless tobacco: Never Used  Substance and Sexual Activity  . Alcohol use: Yes    Comment: occ  . Drug use: No  . Sexual activity: Not on file  Lifestyle  . Physical activity    Days per week: Not on file    Minutes per session: Not on file  . Stress: Not on file  Relationships  . Social Herbalist on phone: Not on file    Gets together: Not on file    Attends religious service: Not on file    Active member of club or organization: Not on file    Attends meetings of clubs or organizations: Not on file    Relationship status: Not on file  . Intimate partner violence    Fear of current or ex partner: Not on file    Emotionally abused: Not on file    Physically abused: Not on file    Forced sexual activity: Not on file  Other Topics Concern  . Not on file  Social History Narrative  . Not on file   Family History  Problem Relation Age of Onset  . Anesthesia problems Mother   . Colon cancer Maternal Grandfather   . Crohn's disease Maternal Grandfather   . Gastric cancer Paternal Uncle   . Esophageal cancer Neg Hx    Past Surgical History:  Procedure Laterality Date  . ANKLE SURGERY    . APPENDECTOMY    . BIOPSY  01/15/2018   Procedure: BIOPSY;  Surgeon: Daneil Dolin, MD;  [facility];  Service: Endoscopy;;  gastric  . CHOLECYSTECTOMY    . ESOPHAGOGASTRODUODENOSCOPY  03/17/2008   RMR: A tiny distal esophageal erosions consistent with mild  erosive reflux esophagitis, otherwise normal esophagus, small hiatal hernia, minimally polypoid antral mucosa with doubtful clinical was significance.  Otherwise, normal stomach, patent pylorus, and normal D1-D2  . ESOPHAGOGASTRODUODENOSCOPY (EGD) WITH PROPOFOL N/A 02/18/2013    Procedure: ESOPHAGOGASTRODUODENOSCOPY (EGD) WITH PROPOFOL;  Surgeon: Daneil Dolin, MD;  [facility];  Service: Endoscopy;  Laterality: N/A;  . ESOPHAGOGASTRODUODENOSCOPY (EGD) WITH PROPOFOL N/A 01/15/2018   Procedure: ESOPHAGOGASTRODUODENOSCOPY (EGD) WITH PROPOFOL;  Surgeon: Daneil Dolin, MD;  [facility];  Service: Endoscopy;  Laterality: N/A;  2:15pm  . MALONEY DILATION N/A 01/15/2018   Procedure: Venia Minks DILATION;  Surgeon: Daneil Dolin, MD;  [facility];  Service: Endoscopy;  Laterality: N/A;  . NASAL SINUS SURGERY  01/24/2012   Procedure: ENDOSCOPIC SINUS SURGERY;  Surgeon: Izora Gala, MD;  Location: [facility];  Service: ENT;  Laterality: Left;  left ethmoidectomy, maxillary, frontal  . UMBILICAL HERNIA REPAIR       Vanessa Kick, MD 09/29/19 1011

## 2019-10-01 ENCOUNTER — Encounter (HOSPITAL_COMMUNITY): Payer: Self-pay | Admitting: Emergency Medicine

## 2019-10-01 ENCOUNTER — Emergency Department (HOSPITAL_COMMUNITY): Payer: Medicare HMO

## 2019-10-01 ENCOUNTER — Emergency Department (HOSPITAL_COMMUNITY)
Admission: EM | Admit: 2019-10-01 | Discharge: 2019-10-01 | Disposition: A | Discharge status: Home / Self Care | Payer: Medicare HMO | Attending: Emergency Medicine | Admitting: Emergency Medicine

## 2019-10-01 ENCOUNTER — Other Ambulatory Visit: Payer: Self-pay

## 2019-10-01 DIAGNOSIS — Y939 Activity, unspecified: Secondary | ICD-10-CM | POA: Diagnosis not present

## 2019-10-01 DIAGNOSIS — Z79899 Other long term (current) drug therapy: Secondary | ICD-10-CM | POA: Insufficient documentation

## 2019-10-01 DIAGNOSIS — F250 Schizoaffective disorder, bipolar type: Secondary | ICD-10-CM | POA: Diagnosis not present

## 2019-10-01 DIAGNOSIS — Y9259 Other trade areas as the place of occurrence of the external cause: Secondary | ICD-10-CM | POA: Diagnosis not present

## 2019-10-01 DIAGNOSIS — R000 Tachycardia, unspecified: Secondary | ICD-10-CM | POA: Diagnosis not present

## 2019-10-01 DIAGNOSIS — R1033 Periumbilical pain: Secondary | ICD-10-CM | POA: Insufficient documentation

## 2019-10-01 DIAGNOSIS — F17210 Nicotine dependence, cigarettes, uncomplicated: Secondary | ICD-10-CM | POA: Diagnosis not present

## 2019-10-01 DIAGNOSIS — T07XXXA Unspecified multiple injuries, initial encounter: Secondary | ICD-10-CM | POA: Diagnosis present

## 2019-10-01 DIAGNOSIS — M545 Low back pain: Secondary | ICD-10-CM | POA: Insufficient documentation

## 2019-10-01 DIAGNOSIS — Y990 Civilian activity done for income or pay: Secondary | ICD-10-CM | POA: Diagnosis not present

## 2019-10-01 LAB — COMPREHENSIVE METABOLIC PANEL
ALT: 24 U/L (ref 0–44)
AST: 21 U/L (ref 15–41)
Albumin: 3.8 g/dL (ref 3.5–5.0)
Alkaline Phosphatase: 93 U/L (ref 38–126)
Anion gap: 13 (ref 5–15)
BUN: 13 mg/dL (ref 6–20)
CO2: 27 mmol/L (ref 22–32)
Calcium: 9.2 mg/dL (ref 8.9–10.3)
Chloride: 101 mmol/L (ref 98–111)
Creatinine, Ser: 0.88 mg/dL (ref 0.61–1.24)
GFR calc Af Amer: >60 mL/min (ref >60)
GFR calc non Af Amer: >60 mL/min (ref >60)
Glucose, Bld: 137 mg/dL — ABNORMAL HIGH (ref 70–99)
Potassium: 4.8 mmol/L (ref 3.5–5.1)
Sodium: 141 mmol/L (ref 135–145)
Total Bilirubin: 0.7 mg/dL (ref 0.3–1.2)
Total Protein: 6.5 g/dL (ref 6.5–8.1)

## 2019-10-01 LAB — CBC
HCT: 54.2 % — ABNORMAL HIGH (ref 39.0–52.0)
Hemoglobin: 18.3 g/dL — ABNORMAL HIGH (ref 13.0–17.0)
MCH: 30.7 pg (ref 26.0–34.0)
MCHC: 33.8 g/dL (ref 30.0–36.0)
MCV: 90.9 fL (ref 80.0–100.0)
Platelets: 240 10*3/uL (ref 150–400)
RBC: 5.96 MIL/uL — ABNORMAL HIGH (ref 4.22–5.81)
RDW: 13.5 % (ref 11.5–15.5)
WBC: 9.4 10*3/uL (ref 4.0–10.5)
nRBC: 0 % (ref 0.0–0.2)

## 2019-10-01 LAB — LACTIC ACID, PLASMA: Lactic Acid, Venous: 2.8 mmol/L (ref 0.5–1.9)

## 2019-10-01 MED ORDER — ONDANSETRON HCL 4 MG/2ML IJ SOLN
4.0000 mg | Freq: Once | INTRAMUSCULAR | Status: AC
  Administered 2019-10-01: 4 mg via INTRAVENOUS
  Filled 2019-10-01: qty 2

## 2019-10-01 MED ORDER — TRAMADOL HCL 50 MG PO TABS: 50.0000 mg | ORAL_TABLET | Freq: Four times a day (QID) | ORAL | 0 refills | Status: DC | PRN

## 2019-10-01 MED ORDER — SODIUM CHLORIDE 0.9 % IV BOLUS
1000.0000 mL | Freq: Once | INTRAVENOUS | Status: AC
  Administered 2019-10-01: 1000 mL via INTRAVENOUS

## 2019-10-01 MED ORDER — MORPHINE SULFATE (PF) 4 MG/ML IV SOLN
4.0000 mg | Freq: Once | INTRAVENOUS | Status: AC
  Administered 2019-10-01: 4 mg via INTRAVENOUS
  Filled 2019-10-01: qty 1

## 2019-10-01 MED ORDER — IOHEXOL 300 MG/ML  SOLN
100.0000 mL | Freq: Once | INTRAMUSCULAR | Status: AC | PRN
  Administered 2019-10-01: 100 mL via INTRAVENOUS

## 2019-10-01 MED ORDER — HYDROMORPHONE HCL 1 MG/ML IJ SOLN
1.0000 mg | Freq: Once | INTRAMUSCULAR | Status: AC
  Administered 2019-10-01: 1 mg via INTRAVENOUS
  Filled 2019-10-01: qty 1

## 2019-10-01 NOTE — ED Notes (Signed)
Charge RN made aware of patient.

## 2019-10-01 NOTE — ED Notes (Signed)
Pt is refusing to have CT scan done until her gets additional pain medication.
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
## 2019-10-01 NOTE — ED Notes (Signed)
Patient verbalizes understanding of discharge instructions. Opportunity for questioning and answers were provided. Armband removed by staff, pt discharged from ED.  

## 2019-10-19 ENCOUNTER — Emergency Department (HOSPITAL_COMMUNITY): Payer: Medicare HMO

## 2019-10-19 ENCOUNTER — Encounter (HOSPITAL_COMMUNITY): Payer: Self-pay | Admitting: Emergency Medicine

## 2019-10-19 ENCOUNTER — Telehealth: Payer: Self-pay | Admitting: *Deleted

## 2019-10-19 ENCOUNTER — Emergency Department (HOSPITAL_COMMUNITY)
Admission: EM | Admit: 2019-10-19 | Discharge: 2019-10-19 | Disposition: A | Discharge status: Home / Self Care | Payer: Medicare HMO | Attending: Emergency Medicine | Admitting: Emergency Medicine

## 2019-10-19 ENCOUNTER — Other Ambulatory Visit: Payer: Self-pay

## 2019-10-19 DIAGNOSIS — Z79899 Other long term (current) drug therapy: Secondary | ICD-10-CM | POA: Insufficient documentation

## 2019-10-19 DIAGNOSIS — I10 Essential (primary) hypertension: Secondary | ICD-10-CM | POA: Insufficient documentation

## 2019-10-19 DIAGNOSIS — X58XXXD Exposure to other specified factors, subsequent encounter: Secondary | ICD-10-CM | POA: Diagnosis not present

## 2019-10-19 DIAGNOSIS — S39012D Strain of muscle, fascia and tendon of lower back, subsequent encounter: Secondary | ICD-10-CM | POA: Diagnosis not present

## 2019-10-19 DIAGNOSIS — Z791 Long term (current) use of non-steroidal anti-inflammatories (NSAID): Secondary | ICD-10-CM | POA: Diagnosis not present

## 2019-10-19 DIAGNOSIS — F17210 Nicotine dependence, cigarettes, uncomplicated: Secondary | ICD-10-CM | POA: Diagnosis not present

## 2019-10-19 DIAGNOSIS — S3992XD Unspecified injury of lower back, subsequent encounter: Secondary | ICD-10-CM | POA: Diagnosis present

## 2019-10-19 DIAGNOSIS — S39012A Strain of muscle, fascia and tendon of lower back, initial encounter: Secondary | ICD-10-CM

## 2019-10-19 DIAGNOSIS — J45909 Unspecified asthma, uncomplicated: Secondary | ICD-10-CM | POA: Diagnosis not present

## 2019-10-19 MED ORDER — METHOCARBAMOL 750 MG PO TABS: 750.0000 mg | ORAL_TABLET | Freq: Four times a day (QID) | ORAL | 0 refills | Status: DC

## 2019-10-19 MED ORDER — PREDNISONE 10 MG (21) PO TBPK: ORAL_TABLET | Freq: Every day | ORAL | 0 refills | Status: DC

## 2019-10-19 MED ORDER — DIAZEPAM 5 MG PO TABS
5.0000 mg | ORAL_TABLET | Freq: Once | ORAL | Status: AC
  Administered 2019-10-19: 10:00:00 5 mg via ORAL
  Filled 2019-10-19: qty 1

## 2019-10-19 MED ORDER — OXYCODONE-ACETAMINOPHEN 5-325 MG PO TABS
2.0000 | ORAL_TABLET | Freq: Once | ORAL | Status: AC
  Administered 2019-10-19: 2 via ORAL
  Filled 2019-10-19: qty 2

## 2019-10-19 NOTE — ED Provider Notes (Signed)
[place] DEPT Provider Note   CSN: [id number] Arrival date & time: 10/19/19  0847     History   Chief Complaint Chief Complaint  Patient presents with  . Back Pain  . leg pain    HPI [name] is a 43 y.o. male.     43 year old male with history of chronic back pain presents with worsening lower lumbar paraspinal discomfort for about a month.  Had an accident was seen in department and that visit was reviewed.  Patient states that yesterday pain became so severe that his legs became weak and he fell down.  Denies any head or neck injury.  Pain goes down both legs.  No bowel or bladder dysfunction.  Has been medicating with gabapentin without relief.     Past Medical History:  Diagnosis Date  . Anxiety   . Asthma   . Bipolar 1 disorder ([place])   . Chronic back pain   . Complication of anesthesia    Anxiety when he awaken with ET tube still in place  . GERD (gastroesophageal reflux disease)   . Hyperlipidemia   . Hypoventilation syndrome   . Left testicular torsion    "20 years ago"  . Lumbar radiculopathy   . Migraines   . Schizoaffective disorder ([place])    [place]    Patient Active Problem List   Diagnosis Date Noted  . Chronic pain disorder 05/04/2019  . Reactive airway disease that is not asthma 05/04/2019  . Asthma 04/29/2019  . Auditory hallucinations 01/20/2019  . Rectal bleeding 04/14/2018  . Dysphagia 12/24/2017  . Schizophrenia ([place]) 05/28/2016  . Abdominal pain, epigastric 02/04/2013  . Constipation 12/02/2012  . Abdominal pain 11/22/2012  . INSOMNIA 04/26/2009  . BACK PAIN, LUMBAR 03/20/2009  . BIPOLAR AFFECTIVE DISORDER 02/17/2009  . OTHER ENTHESOPATHY OF ANKLE AND TARSUS 10/05/2008  . GERD 09/16/2008  . PERSONAL HISTORY OF SCHIZOPHRENIA 05/27/2008  . ESOPHAGITIS, REFLUX 03/23/2008  . HIATAL HERNIA 03/23/2008  . Hyperlipidemia, mixed 01/07/2008  . MORBID OBESITY 01/07/2008  . ANXIETY DEPRESSION  01/07/2008  . TOBACCO ABUSE 01/07/2008  . HYPERTENSION 01/07/2008  . ALLERGIC RHINITIS 01/07/2008  . IBS 01/07/2008    Past Surgical History:  Procedure Laterality Date  . ANKLE SURGERY    . APPENDECTOMY    . BIOPSY  01/15/2018   Procedure: BIOPSY;  Surgeon: Daneil Dolin, MD;  [facility];  Service: Endoscopy;;  gastric  . CHOLECYSTECTOMY    . ESOPHAGOGASTRODUODENOSCOPY  03/17/2008   RMR: A tiny distal esophageal erosions consistent with mild  erosive reflux esophagitis, otherwise normal esophagus, small hiatal hernia, minimally polypoid antral mucosa with doubtful clinical was significance.  Otherwise, normal stomach, patent pylorus, and normal D1-D2  . ESOPHAGOGASTRODUODENOSCOPY (EGD) WITH PROPOFOL N/A 02/18/2013   Procedure: ESOPHAGOGASTRODUODENOSCOPY (EGD) WITH PROPOFOL;  Surgeon: Daneil Dolin, MD;  Location: [facility];  Service: Endoscopy;  Laterality: N/A;  . ESOPHAGOGASTRODUODENOSCOPY (EGD) WITH PROPOFOL N/A 01/15/2018   Procedure: ESOPHAGOGASTRODUODENOSCOPY (EGD) WITH PROPOFOL;  Surgeon: Daneil Dolin, MD;  [facility];  Service: Endoscopy;  Laterality: N/A;  2:15pm  . MALONEY DILATION N/A 01/15/2018   Procedure: Venia Minks DILATION;  Surgeon: Daneil Dolin, MD;  [facility];  Service: Endoscopy;  Laterality: N/A;  . NASAL SINUS SURGERY  01/24/2012   Procedure: ENDOSCOPIC SINUS SURGERY;  Surgeon: Izora Gala, MD;  Location: [facility];  Service: ENT;  Laterality: Left;  left ethmoidectomy, maxillary, frontal  . UMBILICAL HERNIA  REPAIR          Home Medications    Prior to Admission medications   Medication Sig Start Date End Date Taking? Authorizing Provider  albuterol (PROVENTIL) (2.5 MG/3ML) 0.083% nebulizer solution Take 3 mLs (2.5 mg total) by nebulization every 4 (four) hours as needed for wheezing or shortness of breath. Patient not taking: Reported on 07/21/2019 04/29/19   Laurey Morale, MD  albuterol (VENTOLIN HFA) 108 (90 Base) MCG/ACT  inhaler INHALE 2 PUFFS INTO THE LUNGS EVERY 6 HOURS AS NEEDED FOR WHEEZING OR SHORTNESS OF BREATH Patient taking differently: Inhale 2 puffs into the lungs every 6 (six) hours as needed for wheezing or shortness of breath.  04/27/19   [place], Betty G, MD  ALPRAZolam Duanne Moron) 1 MG tablet Take 1 tablet (1 mg total) by mouth 2 (two) times daily as needed for anxiety. Patient taking differently: Take 1 mg by mouth 3 (three) times daily as needed for anxiety.  05/06/19   [place], Betty G, MD  ARIPiprazole (ABILIFY) 20 MG tablet Take 1 tablet (20 mg total) by mouth 2 (two) times daily. Patient not taking: Reported on 05/31/2019 04/09/19   Laurey Morale, MD  baclofen (LIORESAL) 20 MG tablet Take 1 tablet (20 mg total) by mouth 3 (three) times daily as needed for muscle spasms. 09/28/19   Vanessa Kick, MD  doxycycline (VIBRAMYCIN) 100 MG capsule Take 1 capsule (100 mg total) by mouth 2 (two) times daily. 08/15/19   Tasia Catchings, Amy V, PA-C  Fluticasone-Salmeterol (ADVAIR DISKUS) 250-50 MCG/DOSE AEPB Inhale 1 puff into the lungs 2 (two) times daily. 05/04/19   [place], Betty G, MD  gabapentin (NEURONTIN) 300 MG capsule Take 1 capsule (300 mg total) by mouth 3 (three) times daily. 04/16/19   [place], Betty G, MD  meloxicam (MOBIC) 15 MG tablet Take 15 mg by mouth daily.    [provider]  methylphenidate (RITALIN) 20 MG tablet Take 1 tablet by mouth daily. 07/07/19   [provider]  mupirocin ointment (BACTROBAN) 2 % Apply 1 application topically 2 (two) times daily. 08/15/19   Tasia Catchings, Amy V, PA-C  pantoprazole (PROTONIX) 40 MG tablet Take 1 tablet (40 mg total) by mouth daily. 03/29/19   [place], Betty G, MD  temazepam (RESTORIL) 15 MG capsule Take 15 mg by mouth at bedtime. 09/03/19   [provider]  tiZANidine (ZANAFLEX) 4 MG tablet Take 1 tablet (4 mg total) by mouth every 6 (six) hours as needed for muscle spasms. 08/26/19   Mesner, Corene Cornea, MD  traMADol (ULTRAM) 50 MG tablet Take 1 tablet (50 mg total)  by mouth every 6 (six) hours as needed. 10/01/19   Albrizze, Kaitlyn E, PA-C  venlafaxine XR (EFFEXOR XR) 75 MG 24 hr capsule Take 1 capsule (75 mg total) by mouth daily. Patient not taking: Reported on 05/18/2019 04/09/19   Laurey Morale, MD    Family History Family History  Problem Relation Age of Onset  . Anesthesia problems Mother   . Colon cancer Maternal Grandfather   . Crohn's disease Maternal Grandfather   . Gastric cancer Paternal Uncle   . Esophageal cancer Neg Hx     Social History Social History   Tobacco Use  . Smoking status: Current Every Day Smoker    Packs/day: 1.00    Years: 22.00    Pack years: 22.00    Types: Cigarettes  . Smokeless tobacco: Never Used  Substance Use Topics  . Alcohol use: Yes    Comment:  occ  . Drug use: No     Allergies   Compazine, Imitrex [sumatriptan base], Ketorolac tromethamine, Prochlorperazine, Risperidone, Sumatriptan, Lovastatin, Risperidone and related, and Risperidone   Review of Systems Review of Systems  All other systems reviewed and are negative.    Physical Exam Updated Vital Signs BP 123/73 (BP Location: Left Arm)   Pulse 96   Temp 98.7 F (37.1 C) (Oral)   Resp 20   SpO2 97%   Physical Exam Vitals signs and nursing note reviewed.  Constitutional:      General: He is not in acute distress.    Appearance: Normal appearance. He is well-developed. He is not toxic-appearing.  HENT:     Head: Normocephalic and atraumatic.  Eyes:     General: Lids are normal.     Conjunctiva/sclera: Conjunctivae normal.     Pupils: Pupils are equal, round, and reactive to light.  Neck:     Musculoskeletal: Normal range of motion and neck supple.     Thyroid: No thyroid mass.     Trachea: No tracheal deviation.  Cardiovascular:     Rate and Rhythm: Normal rate and regular rhythm.     Heart sounds: Normal heart sounds. No murmur. No gallop.   Pulmonary:     Effort: Pulmonary effort is normal. No respiratory distress.      Breath sounds: Normal breath sounds. No stridor. No decreased breath sounds, wheezing, rhonchi or rales.  Abdominal:     General: Bowel sounds are normal. There is no distension.     Palpations: Abdomen is soft.     Tenderness: There is no abdominal tenderness. There is no rebound.  Musculoskeletal: Normal range of motion.        General: No tenderness.       Back:  Skin:    General: Skin is warm and dry.     Findings: No abrasion or rash.  Neurological:     Mental Status: He is alert and oriented to person, place, and time.     GCS: GCS eye subscore is 4. GCS verbal subscore is 5. GCS motor subscore is 6.     Cranial Nerves: No cranial nerve deficit.     Sensory: No sensory deficit.     Motor: No weakness.     Comments: Patient able to ambulate.  Psychiatric:        Speech: Speech normal.        Behavior: Behavior normal.      ED Treatments / Results  Labs (all labs ordered are listed, but only abnormal results are displayed) Labs Reviewed - No data to display  EKG None  Radiology No results found.  Procedures Procedures (including critical care time)  Medications Ordered in ED Medications  diazepam (VALIUM) tablet 5 mg (has no administration in time range)  oxyCODONE-acetaminophen (PERCOCET/ROXICET) 5-325 MG per tablet 2 tablet (has no administration in time range)     Initial Impression / Assessment and Plan / ED Course  I have reviewed the triage vital signs and the nursing notes.  Pertinent labs & imaging results that were available during my care of the patient were reviewed by me and considered in my medical decision making (see chart for details).        Patient is lumbar spine series negative.  Medicated for pain does feel better.  Patient is neurovascular intact.  Will discharge with prednisone taper as well as muscle relaxants  Final Clinical Impressions(s) / ED Diagnoses   Final diagnoses:  None    ED Discharge Orders    None        Lacretia Leigh, MD 10/19/19 1026

## 2019-10-19 NOTE — Telephone Encounter (Signed)
Pharmacy called related to Rx: methocarbamol not being covered by pt insurance  .Marland KitchenMarland KitchenEDCM clarified with Pharm D to change Rx to: cyclobenzaprine, which is covered.
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
## 2019-11-04 ENCOUNTER — Emergency Department (HOSPITAL_COMMUNITY)
Admission: EM | Admit: 2019-11-04 | Discharge: 2019-11-05 | Disposition: A | Discharge status: Home / Self Care | Payer: Medicare HMO | Attending: Emergency Medicine | Admitting: Emergency Medicine

## 2019-11-04 ENCOUNTER — Encounter (HOSPITAL_COMMUNITY): Payer: Self-pay

## 2019-11-04 ENCOUNTER — Other Ambulatory Visit: Payer: Self-pay

## 2019-11-04 DIAGNOSIS — I10 Essential (primary) hypertension: Secondary | ICD-10-CM | POA: Insufficient documentation

## 2019-11-04 DIAGNOSIS — Z79899 Other long term (current) drug therapy: Secondary | ICD-10-CM | POA: Insufficient documentation

## 2019-11-04 DIAGNOSIS — F17210 Nicotine dependence, cigarettes, uncomplicated: Secondary | ICD-10-CM | POA: Insufficient documentation

## 2019-11-04 DIAGNOSIS — Z20828 Contact with and (suspected) exposure to other viral communicable diseases: Secondary | ICD-10-CM | POA: Diagnosis not present

## 2019-11-04 DIAGNOSIS — J45909 Unspecified asthma, uncomplicated: Secondary | ICD-10-CM | POA: Diagnosis not present

## 2019-11-04 DIAGNOSIS — M549 Dorsalgia, unspecified: Secondary | ICD-10-CM | POA: Diagnosis not present

## 2019-11-04 DIAGNOSIS — G8929 Other chronic pain: Secondary | ICD-10-CM | POA: Diagnosis not present

## 2019-11-04 DIAGNOSIS — R05 Cough: Secondary | ICD-10-CM | POA: Diagnosis present

## 2019-11-04 DIAGNOSIS — K122 Cellulitis and abscess of mouth: Secondary | ICD-10-CM

## 2019-11-04 MED ORDER — OXYCODONE-ACETAMINOPHEN 5-325 MG PO TABS
2.0000 | ORAL_TABLET | Freq: Once | ORAL | Status: AC
  Administered 2019-11-05: 2 via ORAL
  Filled 2019-11-04: qty 2

## 2019-11-04 MED ORDER — CLINDAMYCIN HCL 300 MG PO CAPS
300.0000 mg | ORAL_CAPSULE | Freq: Once | ORAL | Status: AC
  Administered 2019-11-05: 300 mg via ORAL
  Filled 2019-11-04: qty 1

## 2019-11-04 NOTE — ED Notes (Signed)
Requested patient to urinate. 
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
## 2019-11-04 NOTE — ED Notes (Signed)
Notified patient we need urine
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
## 2019-11-05 DIAGNOSIS — M549 Dorsalgia, unspecified: Secondary | ICD-10-CM | POA: Diagnosis not present

## 2019-11-05 LAB — SARS CORONAVIRUS 2 (TAT 6-24 HRS): SARS Coronavirus 2: NEGATIVE

## 2019-11-05 LAB — URINALYSIS, ROUTINE W REFLEX MICROSCOPIC
Bilirubin Urine: NEGATIVE
Glucose, UA: NEGATIVE mg/dL
Hgb urine dipstick: NEGATIVE
Ketones, ur: NEGATIVE mg/dL
Leukocytes,Ua: NEGATIVE
Nitrite: NEGATIVE
Protein, ur: NEGATIVE mg/dL
Specific Gravity, Urine: 1.019 (ref 1.005–1.030)
pH: 6 (ref 5.0–8.0)

## 2019-11-05 MED ORDER — CLINDAMYCIN HCL 150 MG PO CAPS: 300.0000 mg | ORAL_CAPSULE | Freq: Three times a day (TID) | ORAL | 0 refills | Status: DC

## 2019-11-11 ENCOUNTER — Encounter (HOSPITAL_COMMUNITY): Payer: Self-pay | Admitting: Emergency Medicine

## 2019-11-11 ENCOUNTER — Emergency Department (HOSPITAL_COMMUNITY)
Admission: EM | Admit: 2019-11-11 | Discharge: 2019-11-12 | Disposition: A | Discharge status: Home / Self Care | Payer: Medicare HMO | Attending: Emergency Medicine | Admitting: Emergency Medicine

## 2019-11-11 ENCOUNTER — Other Ambulatory Visit: Payer: Self-pay

## 2019-11-11 DIAGNOSIS — Y939 Activity, unspecified: Secondary | ICD-10-CM | POA: Insufficient documentation

## 2019-11-11 DIAGNOSIS — S3992XA Unspecified injury of lower back, initial encounter: Secondary | ICD-10-CM | POA: Diagnosis present

## 2019-11-11 DIAGNOSIS — F17210 Nicotine dependence, cigarettes, uncomplicated: Secondary | ICD-10-CM | POA: Insufficient documentation

## 2019-11-11 DIAGNOSIS — R531 Weakness: Secondary | ICD-10-CM | POA: Insufficient documentation

## 2019-11-11 DIAGNOSIS — Y999 Unspecified external cause status: Secondary | ICD-10-CM | POA: Insufficient documentation

## 2019-11-11 DIAGNOSIS — R300 Dysuria: Secondary | ICD-10-CM | POA: Diagnosis not present

## 2019-11-11 DIAGNOSIS — Y92410 Unspecified street and highway as the place of occurrence of the external cause: Secondary | ICD-10-CM | POA: Diagnosis not present

## 2019-11-11 DIAGNOSIS — I10 Essential (primary) hypertension: Secondary | ICD-10-CM | POA: Diagnosis not present

## 2019-11-11 DIAGNOSIS — S39012D Strain of muscle, fascia and tendon of lower back, subsequent encounter: Secondary | ICD-10-CM

## 2019-11-11 DIAGNOSIS — Z79899 Other long term (current) drug therapy: Secondary | ICD-10-CM | POA: Diagnosis not present

## 2019-11-11 DIAGNOSIS — R112 Nausea with vomiting, unspecified: Secondary | ICD-10-CM | POA: Insufficient documentation

## 2019-11-11 MED ORDER — DEXAMETHASONE SODIUM PHOSPHATE 10 MG/ML IJ SOLN
10.0000 mg | Freq: Once | INTRAMUSCULAR | Status: AC
  Administered 2019-11-12: 10 mg via INTRAMUSCULAR
  Filled 2019-11-11: qty 1
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
## 2019-11-11 NOTE — ED Notes (Signed)
Pt ambulatory from triage with a steady gait, no assistance required.

## 2019-11-12 DIAGNOSIS — S39012D Strain of muscle, fascia and tendon of lower back, subsequent encounter: Secondary | ICD-10-CM | POA: Diagnosis not present

## 2019-11-12 NOTE — ED Provider Notes (Signed)
[place] [facility] DEPT Provider Note  CSN: [id number] Arrival date & time: 11/11/19 1818  Chief Complaint(s) Back Pain, Dysuria, Extremity Weakness, Emesis, and Nausea  HPI [name] is a 43 y.o. male with a history of chronic back pain presents to the emergency department with exacerbation of his back pain for several weeks.  Patient has been seen here multiple times in the last several weeks with negative work-ups.  He is complaining of pain with movements.  Stating that his muscles cramp up.  Patient is able to ambulate.  No bladder/bowel incontinence.  Patient also states that his doctor sister wanted his urine checked because he only urinates twice a day.  Patient denies any dysuria, frequency or urgency.  Denies any abdominal pain.  No nausea or vomiting.  Denies any other physical complaints.  HPI  Past Medical History Past Medical History:  Diagnosis Date  . Anxiety   . Asthma   . Bipolar 1 disorder ([place])   . Chronic back pain   . Complication of anesthesia    Anxiety when he awaken with ET tube still in place  . GERD (gastroesophageal reflux disease)   . Hyperlipidemia   . Hypoventilation syndrome   . Left testicular torsion    "20 years ago"  . Lumbar radiculopathy   . Migraines   . Schizoaffective disorder ([place])    [place]   Patient Active Problem List   Diagnosis Date Noted  . Chronic pain disorder 05/04/2019  . Reactive airway disease that is not asthma 05/04/2019  . Asthma 04/29/2019  . Auditory hallucinations 01/20/2019  . Rectal bleeding 04/14/2018  . Dysphagia 12/24/2017  . Schizophrenia ([place]) 05/28/2016  . Abdominal pain, epigastric 02/04/2013  . Constipation 12/02/2012  . Abdominal pain 11/22/2012  . INSOMNIA 04/26/2009  . BACK PAIN, LUMBAR 03/20/2009  . BIPOLAR AFFECTIVE DISORDER 02/17/2009  . OTHER ENTHESOPATHY OF ANKLE AND TARSUS 10/05/2008  . GERD 09/16/2008  . PERSONAL HISTORY OF SCHIZOPHRENIA 05/27/2008  .  ESOPHAGITIS, REFLUX 03/23/2008  . HIATAL HERNIA 03/23/2008  . Hyperlipidemia, mixed 01/07/2008  . MORBID OBESITY 01/07/2008  . ANXIETY DEPRESSION 01/07/2008  . TOBACCO ABUSE 01/07/2008  . HYPERTENSION 01/07/2008  . ALLERGIC RHINITIS 01/07/2008  . IBS 01/07/2008   Home Medication(s) Prior to Admission medications   Medication Sig Start Date End Date Taking? Authorizing Provider  albuterol (VENTOLIN HFA) 108 (90 Base) MCG/ACT inhaler INHALE 2 PUFFS INTO THE LUNGS EVERY 6 HOURS AS NEEDED FOR WHEEZING OR SHORTNESS OF BREATH Patient taking differently: Inhale 2 puffs into the lungs every 6 (six) hours as needed for wheezing or shortness of breath.  04/27/19  Yes [place], Betty G, MD  ALPRAZolam Duanne Moron) 1 MG tablet Take 1 tablet (1 mg total) by mouth 2 (two) times daily as needed for anxiety. Patient taking differently: Take 1 mg by mouth 3 (three) times daily as needed for anxiety.  05/06/19  Yes [place], Betty G, MD  cyclobenzaprine (FLEXERIL) 10 MG tablet Take 10 mg by mouth 3 (three) times daily. 10/19/19  Yes [provider]  gabapentin (NEURONTIN) 300 MG capsule Take 1 capsule (300 mg total) by mouth 3 (three) times daily. Patient taking differently: Take 1,200 mg by mouth 3 (three) times daily.  04/16/19  Yes Martinique, Betty G, MD  methocarbamol (ROBAXIN-750) 750 MG tablet Take 1 tablet (750 mg total) by mouth 4 (four) times daily. 10/19/19  Yes Lacretia Leigh, MD  pantoprazole (PROTONIX) 40 MG tablet Take 1 tablet (40 mg total) by  mouth daily. 03/29/19  Yes Martin[place], Betty G, MD  promethazine (PHENERGAN) 25 MG tablet Take 25 mg by mouth every 6 (six) hours as needed for nausea or vomiting.   Yes [provider]  QUEtiapine (SEROQUEL XR) 50 MG TB24 24 hr tablet Take 50 mg by mouth daily.   Yes [provider]  SYMBICORT 80-4.5 MCG/ACT inhaler Inhale 2 puffs into the lungs 2 (two) times daily. 10/12/19  Yes [provider]  temazepam (RESTORIL) 15 MG capsule Take 15 mg  by mouth at bedtime. 09/03/19  Yes [provider]  albuterol (PROVENTIL) (2.5 MG/3ML) 0.083% nebulizer solution Take 3 mLs (2.5 mg total) by nebulization every 4 (four) hours as needed for wheezing or shortness of breath. Patient not taking: Reported on 07/21/2019 04/29/19   Laurey Morale, MD  ARIPiprazole (ABILIFY) 20 MG tablet Take 1 tablet (20 mg total) by mouth 2 (two) times daily. Patient not taking: Reported on 05/31/2019 04/09/19   Laurey Morale, MD  baclofen (LIORESAL) 20 MG tablet Take 1 tablet (20 mg total) by mouth 3 (three) times daily as needed for muscle spasms. Patient not taking: Reported on 11/11/2019 09/28/19   Vanessa Kick, MD  clindamycin (CLEOCIN) 150 MG capsule Take 2 capsules (300 mg total) by mouth 3 (three) times daily. May dispense as 150mg  capsules Patient not taking: Reported on 11/11/2019 11/05/19   Montine Circle, PA-C  doxycycline (VIBRAMYCIN) 100 MG capsule Take 1 capsule (100 mg total) by mouth 2 (two) times daily. Patient not taking: Reported on 11/11/2019 08/15/19   Ok Edwards, PA-C  Fluticasone-Salmeterol (ADVAIR DISKUS) 250-50 MCG/DOSE AEPB Inhale 1 puff into the lungs 2 (two) times daily. Patient not taking: Reported on 11/11/2019 05/04/19   [place], Betty G, MD  mupirocin ointment (BACTROBAN) 2 % Apply 1 application topically 2 (two) times daily. Patient not taking: Reported on 11/11/2019 08/15/19   Ok Edwards, PA-C  predniSONE (STERAPRED UNI-PAK 21 TAB) 10 MG (21) TBPK tablet Take by mouth daily. Take 6 tabs by mouth daily  for 2 days, then 5 tabs for 2 days, then 4 tabs for 2 days, then 3 tabs for 2 days, 2 tabs for 2 days, then 1 tab by mouth daily for 2 days Patient not taking: Reported on 11/11/2019 10/19/19   Lacretia Leigh, MD  tiZANidine (ZANAFLEX) 4 MG tablet Take 1 tablet (4 mg total) by mouth every 6 (six) hours as needed for muscle spasms. Patient not taking: Reported on 11/11/2019 08/26/19   Mesner, Corene Cornea, MD  traMADol (ULTRAM) 50 MG tablet  Take 1 tablet (50 mg total) by mouth every 6 (six) hours as needed. Patient not taking: Reported on 11/11/2019 10/01/19   Albrizze, Verline Lema E, PA-C  venlafaxine XR (EFFEXOR XR) 75 MG 24 hr capsule Take 1 capsule (75 mg total) by mouth daily. Patient not taking: Reported on 05/18/2019 04/09/19   Laurey Morale, MD  Past Surgical History Past Surgical History:  Procedure Laterality Date  . ANKLE SURGERY    . APPENDECTOMY    . BIOPSY  01/15/2018   Procedure: BIOPSY;  Surgeon: Daneil Dolin, MD;  Location: [facility];  Service: Endoscopy;;  gastric  . CHOLECYSTECTOMY    . ESOPHAGOGASTRODUODENOSCOPY  03/17/2008   RMR: A tiny distal esophageal erosions consistent with mild  erosive reflux esophagitis, otherwise normal esophagus, small hiatal hernia, minimally polypoid antral mucosa with doubtful clinical was significance.  Otherwise, normal stomach, patent pylorus, and normal D1-D2  . ESOPHAGOGASTRODUODENOSCOPY (EGD) WITH PROPOFOL N/A 02/18/2013   Procedure: ESOPHAGOGASTRODUODENOSCOPY (EGD) WITH PROPOFOL;  Surgeon: Daneil Dolin, MD;  Location: [facility] ORS;  Service: Endoscopy;  Laterality: N/A;  . ESOPHAGOGASTRODUODENOSCOPY (EGD) WITH PROPOFOL N/A 01/15/2018   Procedure: ESOPHAGOGASTRODUODENOSCOPY (EGD) WITH PROPOFOL;  Surgeon: Daneil Dolin, MD;  Location: [facility];  Service: Endoscopy;  Laterality: N/A;  2:15pm  . MALONEY DILATION N/A 01/15/2018   Procedure: Venia Minks DILATION;  Surgeon: Daneil Dolin, MD;  Location: [facility];  Service: Endoscopy;  Laterality: N/A;  . NASAL SINUS SURGERY  01/24/2012   Procedure: ENDOSCOPIC SINUS SURGERY;  Surgeon: Izora Gala, MD;  Location[facility];  Service: ENT;  Laterality: Left;  left ethmoidectomy, maxillary, frontal  . UMBILICAL HERNIA REPAIR     Family History Family History  Problem Relation Age of Onset  . Anesthesia  problems Mother   . Colon cancer Maternal Grandfather   . Crohn's disease Maternal Grandfather   . Gastric cancer Paternal Uncle   . Esophageal cancer Neg Hx     Social History Social History   Tobacco Use  . Smoking status: Current Every Day Smoker    Packs/day: 1.00    Years: 22.00    Pack years: 22.00    Types: Cigarettes  . Smokeless tobacco: Never Used  Substance Use Topics  . Alcohol use: Yes    Comment: occ  . Drug use: No   Allergies Compazine, Imitrex [sumatriptan base], Ketorolac tromethamine, Prochlorperazine, Risperidone, Sumatriptan, Lovastatin, Risperidone and related, and Risperidone  Review of Systems Review of Systems All other systems are reviewed and are negative for acute change except as noted in the HPI  Physical Exam Vital Signs  I have reviewed the triage vital signs BP (!) 124/50   Pulse 85   Temp 98.3 F (36.8 C) (Oral)   Resp 18   Ht 6\' 3"  (1.905 m)   Wt (!) 139.3 kg   SpO2 95%   BMI 38.37 kg/m   Physical Exam Vitals reviewed.  Constitutional:      General: He is not in acute distress.    Appearance: He is well-developed. He is not diaphoretic.  HENT:     Head: Normocephalic and atraumatic.     Jaw: No trismus.     Right Ear: External ear normal.     Left Ear: External ear normal.     Nose: Nose normal.  Eyes:     General: No scleral icterus.    Conjunctiva/sclera: Conjunctivae normal.  Neck:     Trachea: Phonation normal.  Cardiovascular:     Rate and Rhythm: Normal rate and regular rhythm.  Pulmonary:     Effort: Pulmonary effort is normal. No respiratory distress.     Breath sounds: No stridor.  Abdominal:     General: There is no distension.  Musculoskeletal:        General: Normal range of motion.     Cervical  back: Normal range of motion.  Neurological:     Mental Status: He is alert and oriented to person, place, and time.     Comments: Spine Exam: Strength: 5/5 throughout LE bilaterally  Ambulates well   Psychiatric:        Behavior: Behavior normal.     ED Results and Treatments Labs (all labs ordered are listed, but only abnormal results are displayed) Labs Reviewed  URINALYSIS, ROUTINE W REFLEX MICROSCOPIC                                                                                                                         EKG  EKG Interpretation  Date/Time:    Ventricular Rate:    PR Interval:    QRS Duration:   QT Interval:    QTC Calculation:   R Axis:     Text Interpretation:        Radiology No results found.  Pertinent labs & imaging results that were available during my care of the patient were reviewed by me and considered in my medical decision making (see chart for details).  Medications Ordered in ED Medications  dexamethasone (DECADRON) injection 10 mg (10 mg Intramuscular Given 11/12/19 0018)                                                                                                                                    Procedures Procedures  (including critical care time)  Medical Decision Making / ED Course I have reviewed the nursing notes for this encounter and the patient's prior records (if available in EHR or on provided paperwork).   [name] was evaluated in Emergency Department on 11/12/2019 for the symptoms described in the history of present illness. He was evaluated in the context of the global COVID-19 pandemic, which necessitated consideration that the patient might be at risk for infection with the SARS-CoV-2 virus that causes COVID-19. Institutional protocols and algorithms that pertain to the evaluation of patients at risk for COVID-19 are in a state of rapid change based on information released by regulatory bodies including the CDC and federal and state organizations. These policies and algorithms were followed during the patient's care in the ED.  Chronic back pain. No red flag symptoms of fever, weight loss, saddle  anesthesia, weakness, fecal/urinary incontinence or urinary retention.   Suspect MSK etiology. No indication for imaging emergently.  Planned for bladder scan, but patient declined. Wants to be discharged.   The patient appears reasonably screened and/or stabilized for discharge and I doubt any other medical condition or other [facility] requiring further screening, evaluation, or treatment in the ED at this time prior to discharge.       Final Clinical Impression(s) / ED Diagnoses Final diagnoses:  Strain of lumbar region, subsequent encounter      This chart was dictated using voice recognition software.  Despite best efforts to proofread,  errors can occur which can change the documentation meaning.   Fatima Blank, MD 11/12/19 [id number]

## 2019-11-12 NOTE — ED Notes (Signed)
Pt does not want to be bladder scanned, would like discharge papers. EDP made aware.

## 2019-11-16 ENCOUNTER — Encounter (HOSPITAL_COMMUNITY): Payer: Self-pay | Admitting: Emergency Medicine

## 2019-11-16 ENCOUNTER — Emergency Department (HOSPITAL_COMMUNITY)
Admission: EM | Admit: 2019-11-16 | Discharge: 2019-11-16 | Discharge status: Against Medical Advice | Payer: Medicare Other | Attending: Emergency Medicine | Admitting: Emergency Medicine

## 2019-11-16 ENCOUNTER — Other Ambulatory Visit: Payer: Self-pay

## 2019-11-16 DIAGNOSIS — R339 Retention of urine, unspecified: Secondary | ICD-10-CM | POA: Diagnosis present

## 2019-11-16 DIAGNOSIS — Z5321 Procedure and treatment not carried out due to patient leaving prior to being seen by health care provider: Secondary | ICD-10-CM | POA: Insufficient documentation

## 2019-11-16 LAB — URINALYSIS, ROUTINE W REFLEX MICROSCOPIC
Bilirubin Urine: NEGATIVE
Glucose, UA: NEGATIVE mg/dL
Hgb urine dipstick: NEGATIVE
Ketones, ur: NEGATIVE mg/dL
Nitrite: NEGATIVE
Protein, ur: NEGATIVE mg/dL
Specific Gravity, Urine: 1.02 (ref 1.005–1.030)
pH: 5 (ref 5.0–8.0)

## 2019-11-16 NOTE — ED Triage Notes (Signed)
Pt sent by PCP [facility] for urinary retention and CVA tenderness. States he is drinking lots of water but not peeing much out. Endorses left sided flank pain  PCP Isabell Jarvis

## 2019-11-16 NOTE — ED Notes (Signed)
Pt left before getting bloodwork

## 2019-12-02 ENCOUNTER — Ambulatory Visit (INDEPENDENT_AMBULATORY_CARE_PROVIDER_SITE_OTHER): Payer: Medicare Other | Admitting: Clinical

## 2019-12-02 ENCOUNTER — Other Ambulatory Visit: Payer: Self-pay

## 2019-12-02 DIAGNOSIS — F341 Dysthymic disorder: Secondary | ICD-10-CM | POA: Diagnosis not present

## 2019-12-02 DIAGNOSIS — F251 Schizoaffective disorder, depressive type: Secondary | ICD-10-CM | POA: Diagnosis not present
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
## 2019-12-28 ENCOUNTER — Encounter (HOSPITAL_COMMUNITY): Payer: Self-pay | Admitting: Emergency Medicine

## 2019-12-28 ENCOUNTER — Other Ambulatory Visit: Payer: Self-pay

## 2019-12-28 ENCOUNTER — Emergency Department (HOSPITAL_COMMUNITY): Payer: Medicare Other

## 2019-12-28 ENCOUNTER — Ambulatory Visit (HOSPITAL_COMMUNITY): Payer: Medicare Other | Admitting: Clinical

## 2019-12-28 ENCOUNTER — Emergency Department (HOSPITAL_COMMUNITY)
Admission: EM | Admit: 2019-12-28 | Discharge: 2019-12-28 | Disposition: A | Discharge status: Home / Self Care | Payer: Medicare Other | Attending: Emergency Medicine | Admitting: Emergency Medicine

## 2019-12-28 DIAGNOSIS — M5442 Lumbago with sciatica, left side: Secondary | ICD-10-CM | POA: Insufficient documentation

## 2019-12-28 DIAGNOSIS — M5441 Lumbago with sciatica, right side: Secondary | ICD-10-CM | POA: Insufficient documentation

## 2019-12-28 DIAGNOSIS — Z79899 Other long term (current) drug therapy: Secondary | ICD-10-CM | POA: Diagnosis not present

## 2019-12-28 DIAGNOSIS — J45909 Unspecified asthma, uncomplicated: Secondary | ICD-10-CM | POA: Diagnosis not present

## 2019-12-28 DIAGNOSIS — G8929 Other chronic pain: Secondary | ICD-10-CM | POA: Diagnosis not present

## 2019-12-28 DIAGNOSIS — I10 Essential (primary) hypertension: Secondary | ICD-10-CM | POA: Insufficient documentation

## 2019-12-28 DIAGNOSIS — F17210 Nicotine dependence, cigarettes, uncomplicated: Secondary | ICD-10-CM | POA: Diagnosis not present

## 2019-12-28 DIAGNOSIS — M545 Low back pain: Secondary | ICD-10-CM | POA: Diagnosis present

## 2019-12-28 MED ORDER — MORPHINE SULFATE (PF) 4 MG/ML IV SOLN
4.0000 mg | Freq: Once | INTRAVENOUS | Status: AC
  Administered 2019-12-28: 4 mg via INTRAMUSCULAR
  Filled 2019-12-28: qty 1

## 2019-12-28 NOTE — ED Triage Notes (Signed)
Pt c/o mid lower back pains, left flank pains and upper medial leg pains that started around 4am today.

## 2019-12-28 NOTE — ED Provider Notes (Signed)
[facility] DEPT Provider Note   CSN: [id number] Arrival date & time: 12/28/19  0920     History Chief Complaint  Patient presents with  . Back Pain  . Flank Pain  . Leg Pain    [name] is a 44 y.o. male.  Patient is a 44 year old obese gentleman with past medical history of bipolar disorder, asthma, anxiety, chronic back pain presenting to the emergency department for back pain.  Patient reports that he normally gets flare up with this pain and that at 4 AM this morning he began to have increased lower back pain.  Also reports that he is having numbness in his upper thighs and groin area.  Reports that he does have chronic numbness in this area to some degree but feels like it is worse today.  He denies any injury or trauma.  He was able to urinate and have a bowel movement this morning without any difficulties at all.  Denies any increased leg weakness.  He regularly gets 10 mg of oxycodone from his primary care doctor for his chronic pain and is due to be seeing [facility] soon for ongoing management.  Patient had a similar presentation in September, also complaining of saddle anesthesia at that time and MRIs performed showed no signs of cauda equina.        Past Medical History:  Diagnosis Date  . Anxiety   . Asthma   . Bipolar 1 disorder ([place])   . Chronic back pain   . Complication of anesthesia    Anxiety when he awaken with ET tube still in place  . GERD (gastroesophageal reflux disease)   . Hyperlipidemia   . Hypoventilation syndrome   . Left testicular torsion    "20 years ago"  . Lumbar radiculopathy   . Migraines   . Schizoaffective disorder ([place])    [place]    Patient Active Problem List   Diagnosis Date Noted  . Chronic pain disorder 05/04/2019  . Reactive airway disease that is not asthma 05/04/2019  . Asthma 04/29/2019  . Auditory hallucinations 01/20/2019  . Rectal bleeding 04/14/2018  .  Dysphagia 12/24/2017  . Schizophrenia ([place]) 05/28/2016  . Abdominal pain, epigastric 02/04/2013  . Constipation 12/02/2012  . Abdominal pain 11/22/2012  . INSOMNIA 04/26/2009  . BACK PAIN, LUMBAR 03/20/2009  . BIPOLAR AFFECTIVE DISORDER 02/17/2009  . OTHER ENTHESOPATHY OF ANKLE AND TARSUS 10/05/2008  . GERD 09/16/2008  . PERSONAL HISTORY OF SCHIZOPHRENIA 05/27/2008  . ESOPHAGITIS, REFLUX 03/23/2008  . HIATAL HERNIA 03/23/2008  . Hyperlipidemia, mixed 01/07/2008  . MORBID OBESITY 01/07/2008  . ANXIETY DEPRESSION 01/07/2008  . TOBACCO ABUSE 01/07/2008  . HYPERTENSION 01/07/2008  . ALLERGIC RHINITIS 01/07/2008  . IBS 01/07/2008    Past Surgical History:  Procedure Laterality Date  . ANKLE SURGERY    . APPENDECTOMY    . BIOPSY  01/15/2018   Procedure: BIOPSY;  Surgeon: Daneil Dolin, MD;  Location: [facility];  Service: Endoscopy;;  gastric  . CHOLECYSTECTOMY    . ESOPHAGOGASTRODUODENOSCOPY  03/17/2008   RMR: A tiny distal esophageal erosions consistent with mild  erosive reflux esophagitis, otherwise normal esophagus, small hiatal hernia, minimally polypoid antral mucosa with doubtful clinical was significance.  Otherwise, normal stomach, patent pylorus, and normal D1-D2  . ESOPHAGOGASTRODUODENOSCOPY (EGD) WITH PROPOFOL N/A 02/18/2013   Procedure: ESOPHAGOGASTRODUODENOSCOPY (EGD) WITH PROPOFOL;  Surgeon: Daneil Dolin, MD;  Location: [facility] ORS;  Service: Endoscopy;  Laterality: N/A;  .  ESOPHAGOGASTRODUODENOSCOPY (EGD) WITH PROPOFOL N/A 01/15/2018   Procedure: ESOPHAGOGASTRODUODENOSCOPY (EGD) WITH PROPOFOL;  Surgeon: Daneil Dolin, MD;  Location: [facility];  Service: Endoscopy;  Laterality: N/A;  2:15pm  . MALONEY DILATION N/A 01/15/2018   Procedure: Venia Minks DILATION;  Surgeon: Daneil Dolin, MD;  Location: [facility];  Service: Endoscopy;  Laterality: N/A;  . NASAL SINUS SURGERY  01/24/2012   Procedure: ENDOSCOPIC SINUS SURGERY;  Surgeon: Izora Gala, MD;  Location[facility];   Service: ENT;  Laterality: Left;  left ethmoidectomy, maxillary, frontal  . UMBILICAL HERNIA REPAIR         Family History  Problem Relation Age of Onset  . Anesthesia problems Mother   . Colon cancer Maternal Grandfather   . Crohn's disease Maternal Grandfather   . Gastric cancer Paternal Uncle   . Esophageal cancer Neg Hx     Social History   Tobacco Use  . Smoking status: Current Every Day Smoker    Packs/day: 1.00    Years: 22.00    Pack years: 22.00    Types: Cigarettes  . Smokeless tobacco: Never Used  Substance Use Topics  . Alcohol use: Yes    Comment: occ  . Drug use: No    Home Medications Prior to Admission medications   Medication Sig Start Date End Date Taking? Authorizing Provider  albuterol (PROVENTIL) (2.5 MG/3ML) 0.083% nebulizer solution Take 3 mLs (2.5 mg total) by nebulization every 4 (four) hours as needed for wheezing or shortness of breath. Patient not taking: Reported on 07/21/2019 04/29/19   Laurey Morale, MD  albuterol (VENTOLIN HFA) 108 (90 Base) MCG/ACT inhaler INHALE 2 PUFFS INTO THE LUNGS EVERY 6 HOURS AS NEEDED FOR WHEEZING OR SHORTNESS OF BREATH Patient taking differently: Inhale 2 puffs into the lungs every 6 (six) hours as needed for wheezing or shortness of breath.  04/27/19   Martinique, Betty G, MD  ALPRAZolam Duanne Moron) 1 MG tablet Take 1 tablet (1 mg total) by mouth 2 (two) times daily as needed for anxiety. Patient taking differently: Take 1 mg by mouth 3 (three) times daily as needed for anxiety.  05/06/19   [place], Betty G, MD  ARIPiprazole (ABILIFY) 20 MG tablet Take 1 tablet (20 mg total) by mouth 2 (two) times daily. Patient not taking: Reported on 05/31/2019 04/09/19   Laurey Morale, MD  baclofen (LIORESAL) 20 MG tablet Take 1 tablet (20 mg total) by mouth 3 (three) times daily as needed for muscle spasms. Patient not taking: Reported on 11/11/2019 09/28/19   Vanessa Kick, MD  clindamycin (CLEOCIN) 150 MG capsule Take 2 capsules (300 mg  total) by mouth 3 (three) times daily. May dispense as 150mg  capsules Patient not taking: Reported on 11/11/2019 11/05/19   Montine Circle, PA-C  cyclobenzaprine (FLEXERIL) 10 MG tablet Take 10 mg by mouth 3 (three) times daily. 10/19/19   [provider]  doxycycline (VIBRAMYCIN) 100 MG capsule Take 1 capsule (100 mg total) by mouth 2 (two) times daily. Patient not taking: Reported on 11/11/2019 08/15/19   Ok Edwards, PA-C  Fluticasone-Salmeterol (ADVAIR DISKUS) 250-50 MCG/DOSE AEPB Inhale 1 puff into the lungs 2 (two) times daily. Patient not taking: Reported on 11/11/2019 05/04/19   [place], Betty G, MD  gabapentin (NEURONTIN) 300 MG capsule Take 1 capsule (300 mg total) by mouth 3 (three) times daily. Patient taking differently: Take 1,200 mg by mouth 3 (three) times daily.  04/16/19   [place], Betty G, MD  methocarbamol (ROBAXIN-750) 750  MG tablet Take 1 tablet (750 mg total) by mouth 4 (four) times daily. 10/19/19   Lacretia Leigh, MD  mupirocin ointment (BACTROBAN) 2 % Apply 1 application topically 2 (two) times daily. Patient not taking: Reported on 11/11/2019 08/15/19   Ok Edwards, PA-C  pantoprazole (PROTONIX) 40 MG tablet Take 1 tablet (40 mg total) by mouth daily. 03/29/19   [place], Betty G, MD  predniSONE (STERAPRED UNI-PAK 21 TAB) 10 MG (21) TBPK tablet Take by mouth daily. Take 6 tabs by mouth daily  for 2 days, then 5 tabs for 2 days, then 4 tabs for 2 days, then 3 tabs for 2 days, 2 tabs for 2 days, then 1 tab by mouth daily for 2 days Patient not taking: Reported on 11/11/2019 10/19/19   Lacretia Leigh, MD  promethazine (PHENERGAN) 25 MG tablet Take 25 mg by mouth every 6 (six) hours as needed for nausea or vomiting.    [provider]  QUEtiapine (SEROQUEL XR) 50 MG TB24 24 hr tablet Take 50 mg by mouth daily.    [provider]  SYMBICORT 80-4.5 MCG/ACT inhaler Inhale 2 puffs into the lungs 2 (two) times daily. 10/12/19   [provider]  temazepam  (RESTORIL) 15 MG capsule Take 15 mg by mouth at bedtime. 09/03/19   [provider]  tiZANidine (ZANAFLEX) 4 MG tablet Take 1 tablet (4 mg total) by mouth every 6 (six) hours as needed for muscle spasms. Patient not taking: Reported on 11/11/2019 08/26/19   Mesner, Corene Cornea, MD  traMADol (ULTRAM) 50 MG tablet Take 1 tablet (50 mg total) by mouth every 6 (six) hours as needed. Patient not taking: Reported on 11/11/2019 10/01/19   Albrizze, Verline Lema E, PA-C  venlafaxine XR (EFFEXOR XR) 75 MG 24 hr capsule Take 1 capsule (75 mg total) by mouth daily. Patient not taking: Reported on 05/18/2019 04/09/19   Laurey Morale, MD    Allergies    Compazine, Imitrex [sumatriptan base], Ketorolac tromethamine, Prochlorperazine, Risperidone, Sumatriptan, Lovastatin, Risperidone and related, and Risperidone  Review of Systems   Review of Systems  Constitutional: Negative for chills and fever.  Respiratory: Negative for cough and shortness of breath.   Gastrointestinal: Negative for abdominal pain and diarrhea.  Genitourinary: Negative for decreased urine volume, difficulty urinating, dysuria and urgency.  Neurological: Positive for numbness. Negative for weakness.    Physical Exam Updated Vital Signs BP (!) 143/89 (BP Location: Right Arm)   Pulse 86   Temp (!) 97.5 F (36.4 C) (Oral)   Resp 18   SpO2 100%   Physical Exam Vitals and nursing note reviewed. Exam conducted with a chaperone present.  Constitutional:      General: He is not in acute distress.    Appearance: Normal appearance. He is obese. He is not ill-appearing, toxic-appearing or diaphoretic.  HENT:     Head: Normocephalic.  Eyes:     Conjunctiva/sclera: Conjunctivae normal.  Cardiovascular:     Rate and Rhythm: Normal rate and regular rhythm.  Pulmonary:     Effort: Pulmonary effort is normal.  Genitourinary:    Rectum: Abnormal anal tone (somewhat decreased).  Musculoskeletal:        General: Tenderness (Muscular  tenderness in the paraspinal thoracic and lumbar regions bilaterally) present.     Right lower leg: No edema.     Left lower leg: No edema.  Skin:    General: Skin is dry.  Neurological:     Mental Status: He is alert.  Motor: No weakness.     Gait: Gait is intact.     Deep Tendon Reflexes: Reflexes abnormal (absent patellar).     Reflex Scores:      Patellar reflexes are 2+ on the right side and 2+ on the left side.    Comments: Patient with good Sharp?dull sensation in the BLE below the knees. Patient had decreased sharp sensation in the bilateral upper thighs. Patient had decreased rectal tone on exam  Psychiatric:        Mood and Affect: Mood normal.     ED Results / Procedures / Treatments   Labs (all labs ordered are listed, but only abnormal results are displayed) Labs Reviewed - No data to display  EKG None  Radiology DG Lumbar Spine Complete  Result Date: 12/28/2019 CLINICAL DATA:  Back pain EXAM: LUMBAR SPINE - COMPLETE 4+ VIEW COMPARISON:  10/19/2019 FINDINGS: Stable vertebral body heights and alignment. Mild disc space narrowing and small endplate osteophytes are unchanged. Mild facet hypertrophy is again noted at L5-S1. cholecystectomy clips noted. IMPRESSION: No significant change since 10/19/2019. Electronically Signed   By: Macy Mis M.D.   On: 12/28/2019 10:29    Procedures Procedures (including critical care time)  Medications Ordered in ED Medications  morphine 4 MG/ML injection 4 mg (4 mg Intramuscular Given 12/28/19 1031)    ED Course  I have reviewed the triage vital signs and the nursing notes.  Pertinent labs & imaging results that were available during my care of the patient were reviewed by me and considered in my medical decision making (see chart for details).  Clinical Course as of Dec 27 1200  Tue Dec 28, 2019  1015 Patient with chronic back pain presenting with increased back pain and saddle anesthesia.  On exam patient appears well  and in no acute distress.  He does have decreased sensation to the saddle area bilaterally as well as decreased rectal tone and absent patellar reflexes.  Patient had very similar presentation in September with MRI of T and L-spine performed which was largely negative.  This was also discussed with neurosurgery at that time and advised that there is no emergent finding.  Patient has follow-up with primary care tomorrow as well at his upcoming appointment with pain management.  X-ray will be performed and pain will be treated.  Discussed with Dr. Lacinda Axon.  Given his chronic presentation without acute findings as well as largely unremarkable MRI previously, no need to repeat MRI emergently today.   [KM]  1200 Patient improved with morphine.  Patient lumbar spine x-ray is stable.  Patient okay for discharge.  I discussed that any worsening symptoms patient should return to the emergency department for reevaluation.  Specifically if he is having any urinary retention or urinary/stool incontinence.   [KM]    Clinical Course User Index [KM] Kristine Royal   MDM Rules/Calculators/A&P                      Based on review of vitals, medical screening exam, lab work and/or imaging, there does not appear to be an acute, emergent etiology for the patient's symptoms. Counseled pt on good return precautions and encouraged both PCP and ED follow-up as needed.  Prior to discharge, I also discussed incidental imaging findings with patient in detail and advised appropriate, recommended follow-up in detail.  Clinical Impression: 1. Chronic low back pain with bilateral sciatica, unspecified back pain laterality     Disposition: Discharge  Prior to providing a prescription for a controlled substance, I independently reviewed the patient's recent prescription history on the [place]. The patient had no recent or regular prescriptions and was deemed appropriate for a  brief, less than 3 day prescription of narcotic for acute analgesia.  This note was prepared with assistance of Systems analyst. Occasional wrong-word or sound-a-like substitutions may have occurred due to the inherent limitations of voice recognition software.  Final Clinical Impression(s) / ED Diagnoses Final diagnoses:  Chronic low back pain with bilateral sciatica, unspecified back pain laterality    Rx / DC Orders ED Discharge Orders    None       Kristine Royal 12/28/19 1202    Nat Christen, MD 12/28/19 1453

## 2019-12-28 NOTE — Discharge Instructions (Addendum)
Thank you for allowing me to care for you today. Please return to the emergency department if you have new or worsening symptoms. Take your medications as instructed.  ° °

## 2019-12-29 ENCOUNTER — Ambulatory Visit (HOSPITAL_COMMUNITY)
Admission: RE | Admit: 2019-12-29 | Discharge: 2019-12-29 | Disposition: A | Discharge status: Home / Self Care | Payer: Medicare Other | Attending: Psychiatry | Admitting: Psychiatry

## 2019-12-29 DIAGNOSIS — F909 Attention-deficit hyperactivity disorder, unspecified type: Secondary | ICD-10-CM | POA: Insufficient documentation

## 2019-12-29 DIAGNOSIS — F329 Major depressive disorder, single episode, unspecified: Secondary | ICD-10-CM | POA: Insufficient documentation

## 2019-12-29 DIAGNOSIS — F419 Anxiety disorder, unspecified: Secondary | ICD-10-CM | POA: Insufficient documentation

## 2019-12-29 DIAGNOSIS — Z1389 Encounter for screening for other disorder: Secondary | ICD-10-CM | POA: Diagnosis not present

## 2019-12-29 DIAGNOSIS — F259 Schizoaffective disorder, unspecified: Secondary | ICD-10-CM | POA: Insufficient documentation

## 2019-12-29 DIAGNOSIS — Z915 Personal history of self-harm: Secondary | ICD-10-CM | POA: Insufficient documentation

## 2019-12-29 NOTE — H&P (Addendum)
Behavioral Health Medical Screening Exam  [name] is an 44 y.o. male.who presented as a walk-in to [name]. Patient has a PMH of ADHD, anxiety, depression, and schizoaffective disorder. He reports worsening anxiety and states he has been reaching out, trying to get mental health resources although his attempts has been unsuccessful  in regard to getting an appointment. Report he spoke with someone at [name] yesterday although they did not set an appointment.  He he adds that he has had decreased sleep and racing thoughts which he associates with increased anxiety. He denies SI, HI and endorses chronic hallucinations that are intermittent but denies any that are command. He does not appear psychotic. Reports prior suicide attempts in the distant past age 38 or 23). Endorses intermittent feeling of depression. Reports he is currently prescribed Xanax and Seroquel by his PCP. He states," I was hoping that I can get some medication today to help me until I can get in with an outpatient provider." Reports mariajuana use although  denies other substance abuse or use.   Total Time spent with patient: 20 minutes  Psychiatric Specialty Exam: Physical Exam  Nursing note and vitals reviewed. Constitutional: He is oriented to person, place, and time.  Neurological: He is alert and oriented to person, place, and time.    Review of Systems  Psychiatric/Behavioral: The patient is nervous/anxious.     Blood pressure (!) 146/87, pulse 87, temperature 98.2 F (36.8 C), resp. rate 18.There is no height or weight on file to calculate BMI.  General Appearance: Fairly Groomed  Eye Contact:  Good  Speech:  Clear and Coherent and Normal Rate  Volume:  Normal  Mood:  Anxious  Affect:  anxious  Thought Process:  Coherent, Linear and Descriptions of Associations: Intact  Orientation:  Full (Time, Place, and Person)  Thought Content:  Hallucinations: Auditory; none command, describes  hallucinations as intermittent and chronic. Denies that they are command.   Suicidal Thoughts:  No  Homicidal Thoughts:  No  Memory:  Immediate;   Fair Recent;   Fair  Judgement:  Fair  Insight:  Fair  Psychomotor Activity:  Normal  Concentration: Concentration: Fair and Attention Span: Fair  Recall:  [name] of Knowledge:Fair  Language: Good  Akathisia:  Negative  Handed:  Right  AIMS (if indicated):     Assets:  Communication Skills Desire for Improvement Resilience Social Support  Sleep:       Musculoskeletal: Strength & Muscle Tone: within normal limits Gait & Station: normal Patient leans: N/A  Blood pressure (!) 146/87, pulse 87, temperature 98.2 F (36.8 C), resp. rate 18.  Recommendations:  Based on my evaluation the patient does not appear to have an emergency medical condition.  No evidence of imminent risk to self or others at present.   Patient does not meet criteria for psychiatric inpatient admission. [name] counselor spoke with [name] who stated that patient canclled his appointment. He was strongly encouraged to call and reschedule his appointment. Per chart review, patient was [facility] in [name]  although he denies any resent psychiatric services. It is unclear if there was a secondary gain/ an alternative motive for patient presenting to [name] today.   Charles Maes, NP 12/29/2019, 11:33 AM

## 2019-12-29 NOTE — BH Assessment (Signed)
Assessment Note  [name] is an 44 y.o. male presenting to [facility] as a walk-in. He is a self referral. He presents to [facility] with a complaint of increased anxiety and irritation. States he is diagnosed with anxiety and Schizoaffective Disorder. He also has Bipolar I Disorder noted in his medical history. Patient reports that he is unmedicated and needs medications. He has tried finding an outpatient provider but states, "no luck". He as reached out to [facility] [facility] outpatient to see a provider but states that due to communication issues he missed his appointment. He has also tried reaching out to an [facility] provider ([employer]) but has not heard from the company. Patient stating that he wants to get back on his medications before he ends op in the hospital. He was previously prescribed Xanax 1mg  (PRN) and Seroquel ER 50mg . States that these medications worked well for him in the past. He denies SI. He has a history of 1 prior attempt. The previous attempt was brought on by depression. He also has a history of inpatient hospitalizations. When asked how many he responded, "It's so many that I can't count". He denies HI. No history of violent behaviors. No current AVH's. However, reports that his auditory hallucinations are base line and "on/off". He describes the voices as "weird noises and people talking". Patient reports racing thoughts. He denies alcohol use. However, reports daily THC use. His speech is normal. He appears fidgety. His memory is intake. Insight and judgement are fair. He lives in a hotel room with his partner.   Diagnosis: Schizoaffective Disorder, Anxiety Disorder, Cannibas Use Disorder  Past Medical History:  Past Medical History:  Diagnosis Date  . Anxiety   . Asthma   . Bipolar 1 disorder ([place])   . Chronic back pain   . Complication of anesthesia    Anxiety when he awaken with ET tube still in place  . GERD (gastroesophageal reflux disease)   . Hyperlipidemia   . Hypoventilation  syndrome   . Left testicular torsion    "20 years ago"  . Lumbar radiculopathy   . Migraines   . Schizoaffective disorder ([place])    Day [place]    Past Surgical History:  Procedure Laterality Date  . ANKLE SURGERY    . APPENDECTOMY    . BIOPSY  01/15/2018   Procedure: BIOPSY;  Surgeon: Daneil Dolin, MD;  [facility];  Service: Endoscopy;;  gastric  . CHOLECYSTECTOMY    . ESOPHAGOGASTRODUODENOSCOPY  03/17/2008   RMR: A tiny distal esophageal erosions consistent with mild  erosive reflux esophagitis, otherwise normal esophagus, small hiatal hernia, minimally polypoid antral mucosa with doubtful clinical was significance.  Otherwise, normal stomach, patent pylorus, and normal D1-D2  . ESOPHAGOGASTRODUODENOSCOPY (EGD) WITH PROPOFOL N/A 02/18/2013   Procedure: ESOPHAGOGASTRODUODENOSCOPY (EGD) WITH PROPOFOL;  Surgeon: Daneil Dolin, MD;  Location: [facility];  Service: Endoscopy;  Laterality: N/A;  . ESOPHAGOGASTRODUODENOSCOPY (EGD) WITH PROPOFOL N/A 01/15/2018   Procedure: ESOPHAGOGASTRODUODENOSCOPY (EGD) WITH PROPOFOL;  Surgeon: Daneil Dolin, MD;  [facility];  Service: Endoscopy;  Laterality: N/A;  2:15pm  . MALONEY DILATION N/A 01/15/2018   Procedure: Venia Minks DILATION;  Surgeon: Daneil Dolin, MD;  [facility];  Service: Endoscopy;  Laterality: N/A;  . NASAL SINUS SURGERY  01/24/2012   Procedure: ENDOSCOPIC SINUS SURGERY;  Surgeon: Izora Gala, MD;  Location: [place];  Service: ENT;  Laterality: Left;  left ethmoidectomy, maxillary, frontal  . UMBILICAL HERNIA REPAIR  Family History:  Family History  Problem Relation Age of Onset  . Anesthesia problems Mother   . Colon cancer Maternal Grandfather   . Crohn's disease Maternal Grandfather   . Gastric cancer Paternal Uncle   . Esophageal cancer Neg Hx     Social History:  reports that he has been smoking cigarettes. He has a 22.00 pack-year smoking history. He has never used smokeless tobacco. He  reports current alcohol use. He reports that he does not use drugs.  Additional Social History:  Alcohol / Drug Use Pain Medications: SEE MAR Prescriptions: SEE MAR Over the Counter: SEE MAR History of alcohol / drug use?: Yes Longest period of sobriety (when/how long): unk Substance #1 Name of Substance 1: THC 1 - Age of First Use: teens 1 - Amount (size/oz): varies; 1 joint per day 1 - Frequency: daily 1 - Duration: on-going 1 - Last Use / Amount: 12/28/19  CIWA: CIWA-Ar BP: (!) 146/87 Pulse Rate: 87 COWS:    Allergies:  Allergies  Allergen Reactions  . Compazine Shortness Of Breath    Had to be intubated  . Imitrex [Sumatriptan Base] Swelling    Facial swelling  . Ketorolac Tromethamine Anaphylaxis  . Prochlorperazine Anaphylaxis  . Risperidone Other (See Comments)  . Sumatriptan Other (See Comments)  . Lovastatin Other (See Comments)  . Risperidone And Related Other (See Comments)    Shaking and insomnia  . Risperidone Rash    Home Medications: (Not in a hospital admission)   OB/GYN Status:  No LMP for male patient.  General Assessment Data TTS Assessment: In system Is this a Tele or Face-to-Face Assessment?: Face-to-Face Is this an Initial Assessment or a Re-assessment for this encounter?: Initial Assessment Patient Accompanied by:: N/A(self) Language Other than English: No Living Arrangements: Other (Comment)(in a hotel) What gender do you identify as?: Male Marital status: Single Maiden name: (n/a) Pregnancy Status: No Living Arrangements: Alone Can pt return to current living arrangement?: No Admission Status: Voluntary Is patient capable of signing voluntary admission?: Yes Referral Source: Self/Family/Friend Insurance type: (Medicaid and Medicare)     Crisis Care Plan Living Arrangements: Alone Legal Guardian: Other:(no legal guardian ) Name of Psychiatrist: (no psychiatrist; trying to get an appt with [facility] outpatient) Name of Therapist: (no  therapist )  Education Status Is patient currently in school?: No Is the patient employed, unemployed or receiving disability?: Unemployed  Risk to self with the past 6 months Suicidal Ideation: No Has patient been a risk to self within the past 6 months prior to admission? : No Suicidal Intent: No Has patient had any suicidal intent within the past 6 months prior to admission? : No Is patient at risk for suicide?: No Suicidal Plan?: No Has patient had any suicidal plan within the past 6 months prior to admission? : No Access to Means: No What has been your use of drugs/alcohol within the last 12 months?: (THC) Previous Attempts/Gestures: Yes How many times?: (1x- age of 79) Other Self Harm Risks: (denies ) Triggers for Past Attempts: Other (Comment)(depression) Intentional Self Injurious Behavior: None Family Suicide History: No Recent stressful life event(s): Other (Comment), Loss (Comment)(living in a hotel; difficutly finding an outpatient provider) Persecutory voices/beliefs?: No Depression: Yes Depression Symptoms: Feeling angry/irritable, Feeling worthless/self pity, Loss of interest in usual pleasures, Fatigue Substance abuse history and/or treatment for substance abuse?: No Suicide prevention information given to non-admitted patients: Not applicable  Risk to Others within the past 6 months Homicidal Ideation: No Does patient have  any lifetime risk of violence toward others beyond the six months prior to admission? : No Thoughts of Harm to Others: No Current Homicidal Intent: No Current Homicidal Plan: No Access to Homicidal Means: No Identified Victim: (n/a) History of harm to others?: No Assessment of Violence: None Noted Violent Behavior Description: (patient is calm and cooperative ) Does patient have access to weapons?: No Criminal Charges Pending?: No Does patient have a court date: No Is patient on probation?: No  Psychosis Hallucinations: None  noted Delusions: None noted  Mental Status Report Appearance/Hygiene: Disheveled Eye Contact: Fair Motor Activity: Freedom of movement Speech: Logical/coherent Level of Consciousness: Alert Mood: Anxious Affect: Appropriate to circumstance Anxiety Level: None Thought Processes: Relevant Judgement: Impaired Orientation: Person, Time, Place, Situation Obsessive Compulsive Thoughts/Behaviors: Minimal  Cognitive Functioning Concentration: Decreased Memory: Recent Intact, Remote Intact Is patient IDD: No Insight: Good Impulse Control: Fair Appetite: Fair Have you had any weight changes? : No Change Amount of the weight change? (lbs): (no weight loss ) Sleep: Decreased Total Hours of Sleep: (2-3 hrs (varies)) Vegetative Symptoms: None  ADLScreening [facility] Assessment Services) Patient's cognitive ability adequate to safely complete daily activities?: Yes Patient able to express need for assistance with ADLs?: Yes Independently performs ADLs?: No  Prior Inpatient Therapy Prior Inpatient Therapy: Yes Prior Therapy Dates: ("Multiple admissions") Prior Therapy Facilty/Provider(s): ("It's been so many I can't count") Reason for Treatment: (Schizoaffective Disorder)  Prior Outpatient Therapy Prior Outpatient Therapy: Yes Prior Therapy Dates: (current) Prior Therapy Facilty/Provider(s): (In the process of trying to get an appt with [facility]) Reason for Treatment: (med management) Does patient have an ACCT team?: No Does patient have Intensive In-House Services?  : No Does patient have [facility] services? : No Does patient have P4CC services?: No  ADL Screening (condition at time of admission) Patient's cognitive ability adequate to safely complete daily activities?: Yes Is the patient deaf or have difficulty hearing?: No Does the patient have difficulty seeing, even when wearing glasses/contacts?: No Does the patient have difficulty concentrating, remembering, or making  decisions?: No Patient able to express need for assistance with ADLs?: Yes Does the patient have difficulty dressing or bathing?: No Independently performs ADLs?: No Communication: Independent Dressing (OT): Independent Grooming: Independent Feeding: Independent Bathing: Independent Toileting: Independent In/Out Bed: Independent Walks in Home: Independent Does the patient have difficulty walking or climbing stairs?: No Weakness of Legs: None Weakness of Arms/Hands: None  Home Assistive Devices/Equipment Home Assistive Devices/Equipment: None  Therapy Consults (therapy consults require a physician order) PT Evaluation Needed: No OT Evalulation Needed: No SLP Evaluation Needed: No Abuse/Neglect Assessment (Assessment to be complete while patient is alone) Abuse/Neglect Assessment Can Be Completed: Yes Physical Abuse: Denies Verbal Abuse: Denies Sexual Abuse: Denies Exploitation of patient/patient's resources: Denies Self-Neglect: Denies Values / Beliefs Cultural Requests During Hospitalization: None Spiritual Requests During Hospitalization: None Consults Spiritual Care Consult Needed: No Transition of Care Team Consult Needed: No Advance Directives (For Healthcare) Does Patient Have a Medical Advance Directive?: No Nutrition Screen- [facility] Adult/WL/AP Patient's home diet: Regular Has the patient recently lost weight without trying?: No Has the patient been eating poorly because of a decreased appetite?: No Malnutrition Screening Tool Score: 0        Disposition:  Disposition Initial Assessment Completed for this Encounter: Yes Disposition of Patient: Admit Type of inpatient treatment program: Adult  On Site Evaluation by:   Reviewed with Physician:    Waldon Merl 12/29/2019 12:36 PM

## 2019-12-29 NOTE — BH Assessment (Signed)
Assessment Note  [name] is an 44 y.o. male.    Diagnosis:   Past Medical History:  Past Medical History:  Diagnosis Date  . Anxiety   . Asthma   . Bipolar 1 disorder ([place])   . Chronic back pain   . Complication of anesthesia    Anxiety when he awaken with ET tube still in place  . GERD (gastroesophageal reflux disease)   . Hyperlipidemia   . Hypoventilation syndrome   . Left testicular torsion    "20 years ago"  . Lumbar radiculopathy   . Migraines   . Schizoaffective disorder ([place])    Day [place]    Past Surgical History:  Procedure Laterality Date  . ANKLE SURGERY    . APPENDECTOMY    . BIOPSY  01/15/2018   Procedure: BIOPSY;  Surgeon: Daneil Dolin, MD;  [facility];  Service: Endoscopy;;  gastric  . CHOLECYSTECTOMY    . ESOPHAGOGASTRODUODENOSCOPY  03/17/2008   RMR: A tiny distal esophageal erosions consistent with mild  erosive reflux esophagitis, otherwise normal esophagus, small hiatal hernia, minimally polypoid antral mucosa with doubtful clinical was significance.  Otherwise, normal stomach, patent pylorus, and normal D1-D2  . ESOPHAGOGASTRODUODENOSCOPY (EGD) WITH PROPOFOL N/A 02/18/2013   Procedure: ESOPHAGOGASTRODUODENOSCOPY (EGD) WITH PROPOFOL;  Surgeon: Daneil Dolin, MD;  [facility];  Service: Endoscopy;  Laterality: N/A;  . ESOPHAGOGASTRODUODENOSCOPY (EGD) WITH PROPOFOL N/A 01/15/2018   Procedure: ESOPHAGOGASTRODUODENOSCOPY (EGD) WITH PROPOFOL;  Surgeon: Daneil Dolin, MD;  [facility];  Service: Endoscopy;  Laterality: N/A;  2:15pm  . MALONEY DILATION N/A 01/15/2018   Procedure: Venia Minks DILATION;  Surgeon: Daneil Dolin, MD;  [facility];  Service: Endoscopy;  Laterality: N/A;  . NASAL SINUS SURGERY  01/24/2012   Procedure: ENDOSCOPIC SINUS SURGERY;  Surgeon: Izora Gala, MD;  Location: [facility];  Service: ENT;  Laterality: Left;  left ethmoidectomy, maxillary, frontal  . UMBILICAL HERNIA REPAIR      Family  History:  Family History  Problem Relation Age of Onset  . Anesthesia problems Mother   . Colon cancer Maternal Grandfather   . Crohn's disease Maternal Grandfather   . Gastric cancer Paternal Uncle   . Esophageal cancer Neg Hx     Social History:  reports that he has been smoking cigarettes. He has a 22.00 pack-year smoking history. He has never used smokeless tobacco. He reports current alcohol use. He reports that he does not use drugs.  Additional Social History:  Alcohol / Drug Use Pain Medications: SEE MAR Prescriptions: SEE MAR Over the Counter: SEE MAR History of alcohol / drug use?: Yes Longest period of sobriety (when/how long): unk Substance #1 Name of Substance 1: THC 1 - Age of First Use: teens 1 - Amount (size/oz): varies; 1 joint per day 1 - Frequency: daily 1 - Duration: on-going 1 - Last Use / Amount: 12/28/19  CIWA: CIWA-Ar BP: (!) 146/87 Pulse Rate: 87 COWS:    Allergies:  Allergies  Allergen Reactions  . Compazine Shortness Of Breath    Had to be intubated  . Imitrex [Sumatriptan Base] Swelling    Facial swelling  . Ketorolac Tromethamine Anaphylaxis  . Prochlorperazine Anaphylaxis  . Risperidone Other (See Comments)  . Sumatriptan Other (See Comments)  . Lovastatin Other (See Comments)  . Risperidone And Related Other (See Comments)    Shaking and insomnia  . Risperidone Rash    Home Medications: (Not in a hospital admission)  OB/GYN Status:  No LMP for male patient.  General Assessment Data TTS Assessment: In system Is this a Tele or Face-to-Face Assessment?: Face-to-Face Is this an Initial Assessment or a Re-assessment for this encounter?: Initial Assessment Patient Accompanied by:: N/A(self) Language Other than English: No Living Arrangements: Other (Comment)(in a hotel) What gender do you identify as?: Male Marital status: Single Maiden name: (n/a) Pregnancy Status: No Living Arrangements: Alone Can pt return to current living  arrangement?: No Admission Status: Voluntary Is patient capable of signing voluntary admission?: Yes Referral Source: Self/Family/Friend Insurance type: (Medicaid and Medicare)     Crisis Care Plan Living Arrangements: Alone Legal Guardian: Other:(no legal guardian ) Name of Psychiatrist: (no psychiatrist; trying to get an appt with [facility] outpatient) Name of Therapist: (no therapist )  Education Status Is patient currently in school?: No Is the patient employed, unemployed or receiving disability?: Unemployed  Risk to self with the past 6 months Suicidal Ideation: No Has patient been a risk to self within the past 6 months prior to admission? : No Suicidal Intent: No Has patient had any suicidal intent within the past 6 months prior to admission? : No Is patient at risk for suicide?: No Suicidal Plan?: No Has patient had any suicidal plan within the past 6 months prior to admission? : No Access to Means: No What has been your use of drugs/alcohol within the last 12 months?: (THC) Previous Attempts/Gestures: Yes How many times?: (1x- age of 65) Other Self Harm Risks: (denies ) Triggers for Past Attempts: Other (Comment)(depression) Intentional Self Injurious Behavior: None Family Suicide History: No Recent stressful life event(s): Other (Comment), Loss (Comment)(living in a hotel; difficutly finding an outpatient provider) Persecutory voices/beliefs?: No Depression: Yes Depression Symptoms: Feeling angry/irritable, Feeling worthless/self pity, Loss of interest in usual pleasures, Fatigue Substance abuse history and/or treatment for substance abuse?: No Suicide prevention information given to non-admitted patients: Not applicable  Risk to Others within the past 6 months Homicidal Ideation: No Does patient have any lifetime risk of violence toward others beyond the six months prior to admission? : No Thoughts of Harm to Others: No Current Homicidal Intent: No Current  Homicidal Plan: No Access to Homicidal Means: No Identified Victim: (n/a) History of harm to others?: No Assessment of Violence: None Noted Violent Behavior Description: (patient is calm and cooperative ) Does patient have access to weapons?: No Criminal Charges Pending?: No Does patient have a court date: No Is patient on probation?: No  Psychosis Hallucinations: None noted Delusions: None noted  Mental Status Report Appearance/Hygiene: Disheveled Eye Contact: Fair Motor Activity: Freedom of movement Speech: Logical/coherent Level of Consciousness: Alert Mood: Anxious Affect: Appropriate to circumstance Anxiety Level: None Thought Processes: Relevant Judgement: Impaired Orientation: Person, Time, Place, Situation Obsessive Compulsive Thoughts/Behaviors: Minimal  Cognitive Functioning Concentration: Decreased Memory: Recent Intact, Remote Intact Is patient IDD: No Insight: Good Impulse Control: Fair Appetite: Fair Have you had any weight changes? : No Change Amount of the weight change? (lbs): (no weight loss ) Sleep: Decreased Total Hours of Sleep: (2-3 hrs (varies)) Vegetative Symptoms: None  ADLScreening [facility] Assessment Services) Patient's cognitive ability adequate to safely complete daily activities?: Yes Patient able to express need for assistance with ADLs?: Yes Independently performs ADLs?: No  Prior Inpatient Therapy Prior Inpatient Therapy: Yes Prior Therapy Dates: ("Multiple admissions") Prior Therapy Facilty/Provider(s): ("It's been so many I can't count") Reason for Treatment: (Schizoaffective Disorder)  Prior Outpatient Therapy Prior Outpatient Therapy: Yes Prior Therapy Dates: (current) Prior Therapy Facilty/Provider(s): (In  the process of trying to get an appt with [facility] out) Reason for Treatment: (med management) Does patient have an ACCT team?: No Does patient have Intensive In-House Services?  : No Does patient have [facility]  services? : No Does patient have P4CC services?: No  ADL Screening (condition at time of admission) Patient's cognitive ability adequate to safely complete daily activities?: Yes Is the patient deaf or have difficulty hearing?: No Does the patient have difficulty seeing, even when wearing glasses/contacts?: No Does the patient have difficulty concentrating, remembering, or making decisions?: No Patient able to express need for assistance with ADLs?: Yes Does the patient have difficulty dressing or bathing?: No Independently performs ADLs?: No Communication: Independent Dressing (OT): Independent Grooming: Independent Feeding: Independent Bathing: Independent Toileting: Independent In/Out Bed: Independent Walks in Home: Independent Does the patient have difficulty walking or climbing stairs?: No Weakness of Legs: None Weakness of Arms/Hands: None  Home Assistive Devices/Equipment Home Assistive Devices/Equipment: None  Therapy Consults (therapy consults require a physician order) PT Evaluation Needed: No OT Evalulation Needed: No SLP Evaluation Needed: No Abuse/Neglect Assessment (Assessment to be complete while patient is alone) Abuse/Neglect Assessment Can Be Completed: Yes Physical Abuse: Denies Verbal Abuse: Denies Sexual Abuse: Denies Exploitation of patient/patient's resources: Denies Self-Neglect: Denies Values / Beliefs Cultural Requests During Hospitalization: None Spiritual Requests During Hospitalization: None Consults Spiritual Care Consult Needed: No Transition of Care Team Consult Needed: No Advance Directives (For Healthcare) Does Patient Have a Medical Advance Directive?: No Nutrition Screen- [place] Adult/[place]/AP Patient's home diet: Regular Has the patient recently lost weight without trying?: No Has the patient been eating poorly because of a decreased appetite?: No Malnutrition Screening Tool Score: 0        Disposition:  Disposition Initial  Assessment Completed for this Encounter: Yes Disposition of Patient: Admit Type of inpatient treatment program: Adult  On Site Evaluation by:   Reviewed with Physician:    Waldon Merl 12/29/2019 12:25 PM

## 2020-01-11 ENCOUNTER — Ambulatory Visit (HOSPITAL_COMMUNITY): Payer: Medicare Other | Admitting: Clinical

## 2020-01-11 ENCOUNTER — Other Ambulatory Visit: Payer: Self-pay

## 2020-01-11 ENCOUNTER — Encounter (HOSPITAL_COMMUNITY): Payer: Self-pay | Admitting: Emergency Medicine

## 2020-01-11 ENCOUNTER — Ambulatory Visit (HOSPITAL_COMMUNITY)
Admission: EM | Admit: 2020-01-11 | Discharge: 2020-01-11 | Disposition: A | Discharge status: Home / Self Care | Payer: Medicare Other | Attending: Internal Medicine | Admitting: Internal Medicine

## 2020-01-11 DIAGNOSIS — Z20822 Contact with and (suspected) exposure to covid-19: Secondary | ICD-10-CM | POA: Insufficient documentation

## 2020-01-11 DIAGNOSIS — F419 Anxiety disorder, unspecified: Secondary | ICD-10-CM | POA: Insufficient documentation

## 2020-01-11 DIAGNOSIS — J0100 Acute maxillary sinusitis, unspecified: Secondary | ICD-10-CM | POA: Diagnosis present

## 2020-01-11 DIAGNOSIS — J45909 Unspecified asthma, uncomplicated: Secondary | ICD-10-CM | POA: Diagnosis not present

## 2020-01-11 DIAGNOSIS — F259 Schizoaffective disorder, unspecified: Secondary | ICD-10-CM | POA: Insufficient documentation

## 2020-01-11 DIAGNOSIS — E782 Mixed hyperlipidemia: Secondary | ICD-10-CM | POA: Diagnosis not present

## 2020-01-11 DIAGNOSIS — Z79899 Other long term (current) drug therapy: Secondary | ICD-10-CM | POA: Insufficient documentation

## 2020-01-11 DIAGNOSIS — M545 Low back pain: Secondary | ICD-10-CM | POA: Insufficient documentation

## 2020-01-11 DIAGNOSIS — I10 Essential (primary) hypertension: Secondary | ICD-10-CM | POA: Diagnosis not present

## 2020-01-11 DIAGNOSIS — H6120 Impacted cerumen, unspecified ear: Secondary | ICD-10-CM | POA: Diagnosis not present

## 2020-01-11 DIAGNOSIS — G8929 Other chronic pain: Secondary | ICD-10-CM | POA: Insufficient documentation

## 2020-01-11 DIAGNOSIS — F319 Bipolar disorder, unspecified: Secondary | ICD-10-CM | POA: Insufficient documentation

## 2020-01-11 DIAGNOSIS — K2100 Gastro-esophageal reflux disease with esophagitis, without bleeding: Secondary | ICD-10-CM | POA: Diagnosis not present

## 2020-01-11 DIAGNOSIS — K589 Irritable bowel syndrome without diarrhea: Secondary | ICD-10-CM | POA: Insufficient documentation

## 2020-01-11 DIAGNOSIS — F17210 Nicotine dependence, cigarettes, uncomplicated: Secondary | ICD-10-CM | POA: Diagnosis not present

## 2020-01-11 MED ORDER — CETIRIZINE HCL 10 MG PO TABS: 10.0000 mg | ORAL_TABLET | Freq: Every day | ORAL | 0 refills | Status: AC

## 2020-01-11 MED ORDER — AMOXICILLIN-POT CLAVULANATE 875-125 MG PO TABS: 1.0000 | ORAL_TABLET | Freq: Two times a day (BID) | ORAL | 0 refills | Status: AC

## 2020-01-11 NOTE — ED Provider Notes (Signed)
[place]    CSN: [id number] Arrival date & time: 01/11/20  [id number]      History   Chief Complaint Chief Complaint  Patient presents with  . URI    HPI [name] is a 44 y.o. male.   Patient reports urgent care today for complaint of sinus congestion and sinus pressure.  He reports his symptoms have been present for greater than 1 week at this point.  He saw his primary care 5 days ago and was placed on a Z-Pak.  He reports completing this course and has had no improvement in symptoms.  He reports a lot of clear nasal discharge with blood tinged appearance at times.  He reports now he is experiencing left-sided facial pressure and pressure behind his left eye.  He reports a fever of around 100 yesterday but this responded well to Tylenol.  He has also had a frontal headache at times during the duration of his symptoms.  He reports a cough that he believes is related to drainage in his throat.  He has had no shortness of breath.  He has had some ear fullness but no pain.  He endorses a history of recurrent sinus infections 10 to 12 years ago which prompted a sinus surgery.  He reports similar incidences annually of sinus infections.  Patient has also been utilizing Mucinex since his primary care visit.  It is unclear exactly what day he saw his primary care as we do not have his records available.     Past Medical History:  Diagnosis Date  . Anxiety   . Asthma   . Bipolar 1 disorder ([place])   . Chronic back pain   . Complication of anesthesia    Anxiety when he awaken with ET tube still in place  . GERD (gastroesophageal reflux disease)   . Hyperlipidemia   . Hypoventilation syndrome   . Left testicular torsion    "20 years ago"  . Lumbar radiculopathy   . Migraines   . Schizoaffective disorder ([place])    [place]    Patient Active Problem List   Diagnosis Date Noted  . Chronic pain disorder 05/04/2019  . Reactive airway disease that is not asthma  05/04/2019  . Asthma 04/29/2019  . Auditory hallucinations 01/20/2019  . Rectal bleeding 04/14/2018  . Dysphagia 12/24/2017  . Schizophrenia ([place]) 05/28/2016  . Abdominal pain, epigastric 02/04/2013  . Constipation 12/02/2012  . Abdominal pain 11/22/2012  . INSOMNIA 04/26/2009  . BACK PAIN, LUMBAR 03/20/2009  . BIPOLAR AFFECTIVE DISORDER 02/17/2009  . OTHER ENTHESOPATHY OF ANKLE AND TARSUS 10/05/2008  . GERD 09/16/2008  . PERSONAL HISTORY OF SCHIZOPHRENIA 05/27/2008  . ESOPHAGITIS, REFLUX 03/23/2008  . HIATAL HERNIA 03/23/2008  . Hyperlipidemia, mixed 01/07/2008  . MORBID OBESITY 01/07/2008  . ANXIETY DEPRESSION 01/07/2008  . TOBACCO ABUSE 01/07/2008  . HYPERTENSION 01/07/2008  . ALLERGIC RHINITIS 01/07/2008  . IBS 01/07/2008    Past Surgical History:  Procedure Laterality Date  . ANKLE SURGERY    . APPENDECTOMY    . BIOPSY  01/15/2018   Procedure: BIOPSY;  Surgeon: Daneil Dolin, MD;  [facility];  Service: Endoscopy;;  gastric  . CHOLECYSTECTOMY    . ESOPHAGOGASTRODUODENOSCOPY  03/17/2008   RMR: A tiny distal esophageal erosions consistent with mild  erosive reflux esophagitis, otherwise normal esophagus, small hiatal hernia, minimally polypoid antral mucosa with doubtful clinical was significance.  Otherwise, normal stomach, patent pylorus, and normal D1-D2  . ESOPHAGOGASTRODUODENOSCOPY (EGD)  WITH PROPOFOL N/A 02/18/2013   Procedure: ESOPHAGOGASTRODUODENOSCOPY (EGD) WITH PROPOFOL;  Surgeon: Daneil Dolin, MD;  Location: [facility];  Service: Endoscopy;  Laterality: N/A;  . ESOPHAGOGASTRODUODENOSCOPY (EGD) WITH PROPOFOL N/A 01/15/2018   Procedure: ESOPHAGOGASTRODUODENOSCOPY (EGD) WITH PROPOFOL;  Surgeon: Daneil Dolin, MD;  [facility];  Service: Endoscopy;  Laterality: N/A;  2:15pm  . MALONEY DILATION N/A 01/15/2018   Procedure: Venia Minks DILATION;  Surgeon: Daneil Dolin, MD;  Location: [facility];  Service: Endoscopy;  Laterality: N/A;  . NASAL SINUS  SURGERY  01/24/2012   Procedure: ENDOSCOPIC SINUS SURGERY;  Surgeon: Izora Gala, MD;  Location: [facility];  Service: ENT;  Laterality: Left;  left ethmoidectomy, maxillary, frontal  . UMBILICAL HERNIA REPAIR         Home Medications    Prior to Admission medications   Medication Sig Start Date End Date Taking? Authorizing Provider  albuterol (PROVENTIL) (2.5 MG/3ML) 0.083% nebulizer solution Take 3 mLs (2.5 mg total) by nebulization every 4 (four) hours as needed for wheezing or shortness of breath. 04/29/19  Yes Laurey Morale, MD  albuterol (VENTOLIN HFA) 108 (90 Base) MCG/ACT inhaler INHALE 2 PUFFS INTO THE LUNGS EVERY 6 HOURS AS NEEDED FOR WHEEZING OR SHORTNESS OF BREATH Patient taking differently: Inhale 2 puffs into the lungs every 6 (six) hours as needed for wheezing or shortness of breath.  04/27/19  Yes [place], Betty G, MD  ALPRAZolam Duanne Moron) 1 MG tablet Take 1 tablet (1 mg total) by mouth 2 (two) times daily as needed for anxiety. Patient taking differently: Take 1 mg by mouth 3 (three) times daily as needed for anxiety.  05/06/19  Yes [place], Betty G, MD  Multiple Vitamin (MULTIVITAMIN) tablet Take 1 tablet by mouth daily.   Yes [provider]  oxyCODONE-acetaminophen (PERCOCET) 10-325 MG tablet Take 1 tablet by mouth every 4 (four) hours as needed for pain.   Yes [provider]  pantoprazole (PROTONIX) 40 MG tablet Take 1 tablet (40 mg total) by mouth daily. 03/29/19  Yes [place], Betty G, MD  Suvorexant [facility]) 5 MG TABS Take by mouth.   Yes [provider]  SYMBICORT 80-4.5 MCG/ACT inhaler Inhale 2 puffs into the lungs 2 (two) times daily. 10/12/19  Yes [provider]  tamsulosin (FLOMAX) 0.4 MG CAPS capsule Take 0.4 mg by mouth.   Yes [provider]  amoxicillin-clavulanate (AUGMENTIN) 875-125 MG tablet Take 1 tablet by mouth every 12 (twelve) hours for 10 days. 01/11/20 01/21/20  Grizel Vesely, Marguerita Beards, PA-C  ARIPiprazole (ABILIFY) 20 MG tablet  Take 1 tablet (20 mg total) by mouth 2 (two) times daily. Patient not taking: Reported on 05/31/2019 04/09/19   Laurey Morale, MD  baclofen (LIORESAL) 20 MG tablet Take 1 tablet (20 mg total) by mouth 3 (three) times daily as needed for muscle spasms. Patient not taking: Reported on 11/11/2019 09/28/19   Vanessa Kick, MD  cetirizine (ZYRTEC ALLERGY) 10 MG tablet Take 1 tablet (10 mg total) by mouth daily. 01/11/20 02/10/20  Retina Bernardy, Marguerita Beards, PA-C  clindamycin (CLEOCIN) 150 MG capsule Take 2 capsules (300 mg total) by mouth 3 (three) times daily. May dispense as 150mg  capsules Patient not taking: Reported on 11/11/2019 11/05/19   Montine Circle, PA-C  cyclobenzaprine (FLEXERIL) 10 MG tablet Take 10 mg by mouth 3 (three) times daily. 10/19/19   [provider]  Fluticasone-Salmeterol (ADVAIR DISKUS) 250-50 MCG/DOSE AEPB Inhale 1 puff into the lungs 2 (two) times daily. Patient not  taking: Reported on 11/11/2019 05/04/19   [place], Betty G, MD  gabapentin (NEURONTIN) 300 MG capsule Take 1 capsule (300 mg total) by mouth 3 (three) times daily. Patient taking differently: Take 1,200 mg by mouth 3 (three) times daily.  04/16/19   [place], Betty G, MD  methocarbamol (ROBAXIN-750) 750 MG tablet Take 1 tablet (750 mg total) by mouth 4 (four) times daily. 10/19/19   Lacretia Leigh, MD  mupirocin ointment (BACTROBAN) 2 % Apply 1 application topically 2 (two) times daily. Patient not taking: Reported on 11/11/2019 08/15/19   Ok Edwards, PA-C  predniSONE (STERAPRED UNI-PAK 21 TAB) 10 MG (21) TBPK tablet Take by mouth daily. Take 6 tabs by mouth daily  for 2 days, then 5 tabs for 2 days, then 4 tabs for 2 days, then 3 tabs for 2 days, 2 tabs for 2 days, then 1 tab by mouth daily for 2 days Patient not taking: Reported on 11/11/2019 10/19/19   Lacretia Leigh, MD  promethazine (PHENERGAN) 25 MG tablet Take 25 mg by mouth every 6 (six) hours as needed for nausea or vomiting.    [provider]  QUEtiapine  (SEROQUEL XR) 50 MG TB24 24 hr tablet Take 50 mg by mouth daily.    [provider]  temazepam (RESTORIL) 15 MG capsule Take 15 mg by mouth at bedtime. 09/03/19   [provider]  tiZANidine (ZANAFLEX) 4 MG tablet Take 1 tablet (4 mg total) by mouth every 6 (six) hours as needed for muscle spasms. Patient not taking: Reported on 11/11/2019 08/26/19   Mesner, Corene Cornea, MD  traMADol (ULTRAM) 50 MG tablet Take 1 tablet (50 mg total) by mouth every 6 (six) hours as needed. Patient not taking: Reported on 11/11/2019 10/01/19   Albrizze, Verline Lema E, PA-C  venlafaxine XR (EFFEXOR XR) 75 MG 24 hr capsule Take 1 capsule (75 mg total) by mouth daily. Patient not taking: Reported on 05/18/2019 04/09/19   Laurey Morale, MD    Family History Family History  Problem Relation Age of Onset  . Anesthesia problems Mother   . Colon cancer Maternal Grandfather   . Crohn's disease Maternal Grandfather   . Gastric cancer Paternal Uncle   . Esophageal cancer Neg Hx     Social History Social History   Tobacco Use  . Smoking status: Current Every Day Smoker    Packs/day: 1.00    Years: 22.00    Pack years: 22.00    Types: Cigarettes  . Smokeless tobacco: Never Used  Substance Use Topics  . Alcohol use: Yes    Comment: occ  . Drug use: No     Allergies   Compazine, Imitrex [sumatriptan base], Ketorolac tromethamine, Prochlorperazine, Risperidone, Sumatriptan, Lovastatin, Risperidone and related, and Risperidone   Review of Systems Review of Systems  Constitutional: Positive for fever. Negative for chills.  HENT: Positive for congestion, postnasal drip and sinus pressure. Negative for ear pain, hearing loss, sneezing and sore throat.   Eyes: Negative for pain and visual disturbance.  Respiratory: Positive for cough. Negative for shortness of breath.   Cardiovascular: Negative for chest pain and palpitations.  Gastrointestinal: Negative for abdominal pain, diarrhea, nausea and  vomiting.  Genitourinary: Negative for dysuria and hematuria.  Musculoskeletal: Negative for arthralgias and back pain.  Skin: Negative for color change and rash.  Neurological: Positive for headaches. Negative for seizures, syncope and light-headedness.  All other systems reviewed and are negative.    Physical Exam Triage Vital Signs ED Triage Vitals  Enc Vitals Group     BP 01/11/20 0958 127/69     Pulse Rate 01/11/20 0958 83     Resp 01/11/20 0958 (!) 22     Temp 01/11/20 0958 98.2 F (36.8 C)     Temp Source 01/11/20 0958 Oral     SpO2 01/11/20 0958 98 %     Weight --      Height --      Head Circumference --      Peak Flow --      Pain Score 01/11/20 0950 4     Pain Loc --      Pain Edu? --      Excl. in [place]? --    No data found.  Updated Vital Signs BP 127/69 (BP Location: Left Arm)   Pulse 83   Temp 98.2 F (36.8 C) (Oral)   Resp (!) 22   SpO2 98%   Visual Acuity Right Eye Distance:   Left Eye Distance:   Bilateral Distance:    Right Eye Near:   Left Eye Near:    Bilateral Near:     Physical Exam Vitals and nursing note reviewed.  Constitutional:      Appearance: He is well-developed.  HENT:     Head: Normocephalic and atraumatic.     Comments: Tenderness to palpation over the maxillary sinus on the left side.    Ears:     Comments: Left tympanic membrane with mild erythema however landmarks are still visible with evidence of serous fluid.  Unable to visualize right tympanic membrane due to cerumen in canal.  Unable to remove with curette.    Nose: Congestion present.     Comments: Nasal passages with copious discharge mostly clear but with mucopurulence in the left nasal passage.  Turbinates are erythematous and swollen.    Mouth/Throat:     Mouth: Mucous membranes are moist.     Comments: Postnasal drip in the oropharynx.  Otherwise benign exam Eyes:     General: No scleral icterus.    Extraocular Movements: Extraocular movements intact.      Conjunctiva/sclera: Conjunctivae normal.     Pupils: Pupils are equal, round, and reactive to light.  Cardiovascular:     Rate and Rhythm: Normal rate and regular rhythm.     Heart sounds: No murmur.  Pulmonary:     Effort: Pulmonary effort is normal. No respiratory distress.     Breath sounds: Normal breath sounds. No wheezing or rales.  Musculoskeletal:     Cervical back: Normal range of motion and neck supple.  Lymphadenopathy:     Cervical: No cervical adenopathy.  Skin:    General: Skin is warm and dry.  Neurological:     General: No focal deficit present.     Mental Status: He is alert and oriented to person, place, and time.      UC Treatments / Results  Labs (all labs ordered are listed, but only abnormal results are displayed) Labs Reviewed  NOVEL CORONAVIRUS, NAA (HOSP ORDER, SEND-OUT TO REF LAB; TAT 18-24 HRS)    EKG   Radiology No results found.  Procedures Procedures (including critical care time)  Medications Ordered in UC Medications - No data to display  Initial Impression / Assessment and Plan / UC Course  I have reviewed the triage vital signs and the nursing notes.  Pertinent labs & imaging results that were available during my care of the patient were reviewed by me and considered in my  medical decision making (see chart for details).     #Acute maxillary sinusitis #Cerumen in right canal. Patient is a 44 year old male with a history of recurrent sinusitis presenting for acute sinusitis symptoms.  Given physical exam findings today and worsening symptoms following initial treatment with Z-Pak will give Augmentin for 10 days.  Recommended follow-up with primary care if not improving in 5 to 7 days consideration of following with ENT.  Recommended begin Zyrtec and nasal saline washes daily.  Recommended continue Mucinex if you believe this is helping.  Patient did not wish to have irrigation of the right ear today recommended utilize Debrox he states  he has this at home.  Covid PCR was sent.  Final Clinical Impressions(s) / UC Diagnoses   Final diagnoses:  Acute maxillary sinusitis, recurrence not specified  Cerumen in auditory canal on examination     Discharge Instructions     Take the augmentin 2 times a day for 10 days.  Use nasal saline frequently throughout the day  Continue the mucinex if you believe this helps. Drink plenty of water.  I want you to pick up debrox and use debrox   Begin using zyrtec daily.  Once you have complete the course of augmentin and have symptom resolution begin using the flonase.  If symptoms are not significantly improving in 5-7 days please follow up with your primary care.  If your Covid-19 test is positive, you will receive a phone call from [facility] regarding your results. Negative test results are not called. Both positive and negative results area always visible on MyChart. If you do not have a MyChart account, sign up instructions are in your discharge papers.   Persons who are directed to care for themselves at home may discontinue isolation under the following conditions:  . At least 10 days have passed since symptom onset and . At least 24 hours have passed without running a fever (this means without the use of fever-reducing medications) and . Other symptoms have improved.  Persons infected with COVID-19 who never develop symptoms may discontinue isolation and other precautions 10 days after the date of their first positive COVID-19 test.       ED Prescriptions    Medication Sig Dispense Auth. Provider   amoxicillin-clavulanate (AUGMENTIN) 875-125 MG tablet Take 1 tablet by mouth every 12 (twelve) hours for 10 days. 20 tablet Chayanne Speir, Marguerita Beards, PA-C   cetirizine (ZYRTEC ALLERGY) 10 MG tablet Take 1 tablet (10 mg total) by mouth daily. 30 tablet Johnson Arizola, Marguerita Beards, PA-C     PDMP not reviewed this encounter.   Purnell Shoemaker, PA-C 01/11/20 1046

## 2020-01-11 NOTE — Discharge Instructions (Addendum)
Take the augmentin 2 times a day for 10 days.  Use nasal saline frequently throughout the day  Continue the mucinex if you believe this helps. Drink plenty of water.  I want you to pick up debrox and use debrox   Begin using zyrtec daily.  Once you have complete the course of augmentin and have symptom resolution begin using the flonase.  If symptoms are not significantly improving in 5-7 days please follow up with your primary care.  If your Covid-19 test is positive, you will receive a phone call from [facility] regarding your results. Negative test results are not called. Both positive and negative results area always visible on MyChart. If you do not have a MyChart account, sign up instructions are in your discharge papers.   Persons who are directed to care for themselves at home may discontinue isolation under the following conditions:   At least 10 days have passed since symptom onset and  At least 24 hours have passed without running a fever (this means without the use of fever-reducing medications) and  Other symptoms have improved.  Persons infected with COVID-19 who never develop symptoms may discontinue isolation and other precautions 10 days after the date of their first positive COVID-19 test.

## 2020-01-11 NOTE — ED Triage Notes (Signed)
symptoms for 5 days.  Took zpac and mucinex (has seen pcp) Patient has sinus stuffiness, eyes watery.  Nose secretions is blood tinged.   Patient has been using humidifier.

## 2020-01-13 LAB — NOVEL CORONAVIRUS, NAA (HOSP ORDER, SEND-OUT TO REF LAB; TAT 18-24 HRS): SARS-CoV-2, NAA: NOT DETECTED

## 2020-01-15 ENCOUNTER — Other Ambulatory Visit: Payer: Self-pay

## 2020-01-15 ENCOUNTER — Encounter (HOSPITAL_COMMUNITY): Payer: Self-pay

## 2020-01-15 ENCOUNTER — Emergency Department (HOSPITAL_COMMUNITY)
Admission: EM | Admit: 2020-01-15 | Discharge: 2020-01-15 | Disposition: A | Discharge status: Home / Self Care | Payer: Medicare Other | Attending: Emergency Medicine | Admitting: Emergency Medicine

## 2020-01-15 ENCOUNTER — Emergency Department (HOSPITAL_COMMUNITY): Payer: Medicare Other

## 2020-01-15 DIAGNOSIS — G8929 Other chronic pain: Secondary | ICD-10-CM | POA: Diagnosis not present

## 2020-01-15 DIAGNOSIS — R110 Nausea: Secondary | ICD-10-CM | POA: Diagnosis not present

## 2020-01-15 DIAGNOSIS — F17210 Nicotine dependence, cigarettes, uncomplicated: Secondary | ICD-10-CM | POA: Insufficient documentation

## 2020-01-15 DIAGNOSIS — M545 Low back pain, unspecified: Secondary | ICD-10-CM

## 2020-01-15 DIAGNOSIS — R200 Anesthesia of skin: Secondary | ICD-10-CM | POA: Diagnosis not present

## 2020-01-15 DIAGNOSIS — J45909 Unspecified asthma, uncomplicated: Secondary | ICD-10-CM | POA: Insufficient documentation

## 2020-01-15 DIAGNOSIS — R109 Unspecified abdominal pain: Secondary | ICD-10-CM | POA: Insufficient documentation

## 2020-01-15 DIAGNOSIS — M549 Dorsalgia, unspecified: Secondary | ICD-10-CM | POA: Diagnosis present

## 2020-01-15 DIAGNOSIS — Z79899 Other long term (current) drug therapy: Secondary | ICD-10-CM | POA: Diagnosis not present

## 2020-01-15 DIAGNOSIS — I10 Essential (primary) hypertension: Secondary | ICD-10-CM | POA: Diagnosis not present

## 2020-01-15 LAB — CBC WITH DIFFERENTIAL/PLATELET
Abs Immature Granulocytes: 0.03 10*3/uL (ref 0.00–0.07)
Basophils Absolute: 0 10*3/uL (ref 0.0–0.1)
Basophils Relative: 0 %
Eosinophils Absolute: 0.3 10*3/uL (ref 0.0–0.5)
Eosinophils Relative: 3 %
HCT: 53.9 % — ABNORMAL HIGH (ref 39.0–52.0)
Hemoglobin: 18.2 g/dL — ABNORMAL HIGH (ref 13.0–17.0)
Immature Granulocytes: 0 %
Lymphocytes Relative: 24 %
Lymphs Abs: 2.4 10*3/uL (ref 0.7–4.0)
MCH: 31.7 pg (ref 26.0–34.0)
MCHC: 33.8 g/dL (ref 30.0–36.0)
MCV: 93.7 fL (ref 80.0–100.0)
Monocytes Absolute: 0.7 10*3/uL (ref 0.1–1.0)
Monocytes Relative: 7 %
Neutro Abs: 6.3 10*3/uL (ref 1.7–7.7)
Neutrophils Relative %: 66 %
Platelets: 246 10*3/uL (ref 150–400)
RBC: 5.75 MIL/uL (ref 4.22–5.81)
RDW: 14.2 % (ref 11.5–15.5)
WBC: 9.7 10*3/uL (ref 4.0–10.5)
nRBC: 0 % (ref 0.0–0.2)

## 2020-01-15 LAB — BASIC METABOLIC PANEL
Anion gap: 8 (ref 5–15)
BUN: 9 mg/dL (ref 6–20)
CO2: 22 mmol/L (ref 22–32)
Calcium: 8.7 mg/dL — ABNORMAL LOW (ref 8.9–10.3)
Chloride: 108 mmol/L (ref 98–111)
Creatinine, Ser: 0.83 mg/dL (ref 0.61–1.24)
GFR calc Af Amer: >60 mL/min (ref >60)
GFR calc non Af Amer: >60 mL/min (ref >60)
Glucose, Bld: 85 mg/dL (ref 70–99)
Potassium: 4.6 mmol/L (ref 3.5–5.1)
Sodium: 138 mmol/L (ref 135–145)

## 2020-01-15 LAB — URINALYSIS, ROUTINE W REFLEX MICROSCOPIC
Bilirubin Urine: NEGATIVE
Glucose, UA: NEGATIVE mg/dL
Hgb urine dipstick: NEGATIVE
Ketones, ur: NEGATIVE mg/dL
Leukocytes,Ua: NEGATIVE
Nitrite: NEGATIVE
Protein, ur: NEGATIVE mg/dL
Specific Gravity, Urine: 1.012 (ref 1.005–1.030)
pH: 7 (ref 5.0–8.0)

## 2020-01-15 MED ORDER — MORPHINE SULFATE (PF) 4 MG/ML IV SOLN
4.0000 mg | Freq: Once | INTRAVENOUS | Status: AC
  Administered 2020-01-15: 4 mg via INTRAVENOUS
  Filled 2020-01-15: qty 1

## 2020-01-15 MED ORDER — METHOCARBAMOL 500 MG PO TABS: 500.0000 mg | ORAL_TABLET | Freq: Two times a day (BID) | ORAL | 0 refills | Status: AC

## 2020-01-15 MED ORDER — LIDOCAINE 5 % EX PTCH
2.0000 | MEDICATED_PATCH | CUTANEOUS | Status: DC
  Administered 2020-01-15: 2 via TRANSDERMAL
  Filled 2020-01-15: qty 2

## 2020-01-15 NOTE — ED Notes (Signed)
Pt verbalizes understanding of DC instructions. Pt belongings returned and is ambulatory out of ED.  

## 2020-01-15 NOTE — ED Triage Notes (Signed)
Pt reports mid back pain and left sided flank pain x2 days. Pt reports hx of back problems. Pt denies any urinary symptoms.

## 2020-01-15 NOTE — ED Notes (Signed)
Pt provided snack and drink

## 2020-01-15 NOTE — Discharge Instructions (Signed)
Continue pain management medications prescribed to you You can try Robaxin as needed for muscle pain/spasms Please follow up with your pain management doctor

## 2020-01-15 NOTE — ED Provider Notes (Signed)
[place] DEPT Provider Note   CSN: [id number] Arrival date & time: 01/15/20  1218     History Chief Complaint  Patient presents with  . Back Pain  . Flank Pain    [name] is a 44 y.o. male with history of chronic back pain who presents with back pain and flank pain. He states that the pain started acutely around 9PM last night. It is in the middle of his back and radiates to the left side. He feels it's different from his chronic back pain because he has not had flank pain before. It was colicky in nature last night but now constant. He takes Oxycodone 10/325mg  for pain. He has seen neurosurgery this week and was told that they did not feel surgery would correct his problem and pain management was recommended. He has chronic numbness in the bilateral thighs which is unchanged. No bowel/bladder incontinence. No dysuria, frequency, or hematuria. Pt feels like something is going on with his kidneys. He denies fever, abdominal pain, vomiting. He had some nausea and took Zofran which helped. He denies new back injury or heavy lifting. No hx of kidney stones  HPI      Past Medical History:  Diagnosis Date  . Anxiety   . Asthma   . Bipolar 1 disorder ([place])   . Chronic back pain   . Complication of anesthesia    Anxiety when he awaken with ET tube still in place  . GERD (gastroesophageal reflux disease)   . Hyperlipidemia   . Hypoventilation syndrome   . Left testicular torsion    "20 years ago"  . Lumbar radiculopathy   . Migraines   . Schizoaffective disorder ([place])    [place]    Patient Active Problem List   Diagnosis Date Noted  . Chronic pain disorder 05/04/2019  . Reactive airway disease that is not asthma 05/04/2019  . Asthma 04/29/2019  . Auditory hallucinations 01/20/2019  . Rectal bleeding 04/14/2018  . Dysphagia 12/24/2017  . Schizophrenia ([place]) 05/28/2016  . Abdominal pain, epigastric 02/04/2013  . Constipation  12/02/2012  . Abdominal pain 11/22/2012  . INSOMNIA 04/26/2009  . BACK PAIN, LUMBAR 03/20/2009  . BIPOLAR AFFECTIVE DISORDER 02/17/2009  . OTHER ENTHESOPATHY OF ANKLE AND TARSUS 10/05/2008  . GERD 09/16/2008  . PERSONAL HISTORY OF SCHIZOPHRENIA 05/27/2008  . ESOPHAGITIS, REFLUX 03/23/2008  . HIATAL HERNIA 03/23/2008  . Hyperlipidemia, mixed 01/07/2008  . MORBID OBESITY 01/07/2008  . ANXIETY DEPRESSION 01/07/2008  . TOBACCO ABUSE 01/07/2008  . HYPERTENSION 01/07/2008  . ALLERGIC RHINITIS 01/07/2008  . IBS 01/07/2008    Past Surgical History:  Procedure Laterality Date  . ANKLE SURGERY    . APPENDECTOMY    . BIOPSY  01/15/2018   Procedure: BIOPSY;  Surgeon: Daneil Dolin, MD;  Location: [facility];  Service: Endoscopy;;  gastric  . CHOLECYSTECTOMY    . ESOPHAGOGASTRODUODENOSCOPY  03/17/2008   RMR: A tiny distal esophageal erosions consistent with mild  erosive reflux esophagitis, otherwise normal esophagus, small hiatal hernia, minimally polypoid antral mucosa with doubtful clinical was significance.  Otherwise, normal stomach, patent pylorus, and normal D1-D2  . ESOPHAGOGASTRODUODENOSCOPY (EGD) WITH PROPOFOL N/A 02/18/2013   Procedure: ESOPHAGOGASTRODUODENOSCOPY (EGD) WITH PROPOFOL;  Surgeon: Daneil Dolin, MD;  Location: [facility] ORS;  Service: Endoscopy;  Laterality: N/A;  . ESOPHAGOGASTRODUODENOSCOPY (EGD) WITH PROPOFOL N/A 01/15/2018   Procedure: ESOPHAGOGASTRODUODENOSCOPY (EGD) WITH PROPOFOL;  Surgeon: Daneil Dolin, MD;  Location: [facility];  Service: Endoscopy;  Laterality: N/A;  2:15pm  . MALONEY DILATION N/A 01/15/2018   Procedure: Venia Minks DILATION;  Surgeon: Daneil Dolin, MD;  Location: [facility];  Service: Endoscopy;  Laterality: N/A;  . NASAL SINUS SURGERY  01/24/2012   Procedure: ENDOSCOPIC SINUS SURGERY;  Surgeon: Izora Gala, MD;  Location: [facility] OR;  Service: ENT;  Laterality: Left;  left ethmoidectomy, maxillary, frontal  . UMBILICAL HERNIA REPAIR          Family History  Problem Relation Age of Onset  . Anesthesia problems Mother   . Colon cancer Maternal Grandfather   . Crohn's disease Maternal Grandfather   . Gastric cancer Paternal Uncle   . Esophageal cancer Neg Hx     Social History   Tobacco Use  . Smoking status: Current Every Day Smoker    Packs/day: 1.00    Years: 22.00    Pack years: 22.00    Types: Cigarettes  . Smokeless tobacco: Never Used  Substance Use Topics  . Alcohol use: Yes    Comment: occ  . Drug use: No    Home Medications Prior to Admission medications   Medication Sig Start Date End Date Taking? Authorizing Provider  albuterol (PROVENTIL) (2.5 MG/3ML) 0.083% nebulizer solution Take 3 mLs (2.5 mg total) by nebulization every 4 (four) hours as needed for wheezing or shortness of breath. 04/29/19   Laurey Morale, MD  albuterol (VENTOLIN HFA) 108 (90 Base) MCG/ACT inhaler INHALE 2 PUFFS INTO THE LUNGS EVERY 6 HOURS AS NEEDED FOR WHEEZING OR SHORTNESS OF BREATH Patient taking differently: Inhale 2 puffs into the lungs every 6 (six) hours as needed for wheezing or shortness of breath.  04/27/19   [place]ique, Betty G, MD  ALPRAZolam Duanne Moron) 1 MG tablet Take 1 tablet (1 mg total) by mouth 2 (two) times daily as needed for anxiety. Patient taking differently: Take 1 mg by mouth 3 (three) times daily as needed for anxiety.  05/06/19   [place], Betty G, MD  amoxicillin-clavulanate (AUGMENTIN) 875-125 MG tablet Take 1 tablet by mouth every 12 (twelve) hours for 10 days. 01/11/20 01/21/20  Darr, Marguerita Beards, PA-C  ARIPiprazole (ABILIFY) 20 MG tablet Take 1 tablet (20 mg total) by mouth 2 (two) times daily. Patient not taking: Reported on 05/31/2019 04/09/19   Laurey Morale, MD  baclofen (LIORESAL) 20 MG tablet Take 1 tablet (20 mg total) by mouth 3 (three) times daily as needed for muscle spasms. Patient not taking: Reported on 11/11/2019 09/28/19   Vanessa Kick, MD  cetirizine (ZYRTEC ALLERGY) 10 MG tablet Take 1 tablet (10 mg  total) by mouth daily. 01/11/20 02/10/20  Darr, Marguerita Beards, PA-C  clindamycin (CLEOCIN) 150 MG capsule Take 2 capsules (300 mg total) by mouth 3 (three) times daily. May dispense as 150mg  capsules Patient not taking: Reported on 11/11/2019 11/05/19   Montine Circle, PA-C  cyclobenzaprine (FLEXERIL) 10 MG tablet Take 10 mg by mouth 3 (three) times daily. 10/19/19   [provider]  Fluticasone-Salmeterol (ADVAIR DISKUS) 250-50 MCG/DOSE AEPB Inhale 1 puff into the lungs 2 (two) times daily. Patient not taking: Reported on 11/11/2019 05/04/19   [place], Betty G, MD  gabapentin (NEURONTIN) 300 MG capsule Take 1 capsule (300 mg total) by mouth 3 (three) times daily. Patient taking differently: Take 1,200 mg by mouth 3 (three) times daily.  04/16/19   [place], Betty G, MD  methocarbamol (ROBAXIN-750) 750 MG tablet Take 1 tablet (750 mg total) by mouth 4 (four) times daily. 10/19/19  Lacretia Leigh, MD  Multiple Vitamin (MULTIVITAMIN) tablet Take 1 tablet by mouth daily.    [provider]  mupirocin ointment (BACTROBAN) 2 % Apply 1 application topically 2 (two) times daily. Patient not taking: Reported on 11/11/2019 08/15/19   Ok Edwards, PA-C  oxyCODONE-acetaminophen (PERCOCET) 10-325 MG tablet Take 1 tablet by mouth every 4 (four) hours as needed for pain.    [provider]  pantoprazole (PROTONIX) 40 MG tablet Take 1 tablet (40 mg total) by mouth daily. 03/29/19   [place], Betty G, MD  predniSONE (STERAPRED UNI-PAK 21 TAB) 10 MG (21) TBPK tablet Take by mouth daily. Take 6 tabs by mouth daily  for 2 days, then 5 tabs for 2 days, then 4 tabs for 2 days, then 3 tabs for 2 days, 2 tabs for 2 days, then 1 tab by mouth daily for 2 days Patient not taking: Reported on 11/11/2019 10/19/19   Lacretia Leigh, MD  promethazine (PHENERGAN) 25 MG tablet Take 25 mg by mouth every 6 (six) hours as needed for nausea or vomiting.    [provider]  QUEtiapine (SEROQUEL XR) 50 MG TB24 24 hr tablet  Take 50 mg by mouth daily.    [provider]  Suvorexant (BELSOMRA) 5 MG TABS Take by mouth.    [provider]  SYMBICORT 80-4.5 MCG/ACT inhaler Inhale 2 puffs into the lungs 2 (two) times daily. 10/12/19   [provider]  tamsulosin (FLOMAX) 0.4 MG CAPS capsule Take 0.4 mg by mouth.    [provider]  temazepam (RESTORIL) 15 MG capsule Take 15 mg by mouth at bedtime. 09/03/19   [provider]  tiZANidine (ZANAFLEX) 4 MG tablet Take 1 tablet (4 mg total) by mouth every 6 (six) hours as needed for muscle spasms. Patient not taking: Reported on 11/11/2019 08/26/19   Mesner, Corene Cornea, MD  traMADol (ULTRAM) 50 MG tablet Take 1 tablet (50 mg total) by mouth every 6 (six) hours as needed. Patient not taking: Reported on 11/11/2019 10/01/19   Albrizze, Verline Lema E, PA-C  venlafaxine XR (EFFEXOR XR) 75 MG 24 hr capsule Take 1 capsule (75 mg total) by mouth daily. Patient not taking: Reported on 05/18/2019 04/09/19   Laurey Morale, MD    Allergies    Compazine, Imitrex [sumatriptan base], Ketorolac tromethamine, Prochlorperazine, Risperidone, Sumatriptan, Lovastatin, Risperidone and related, and Risperidone  Review of Systems   Review of Systems  Constitutional: Negative for fever.  Gastrointestinal: Positive for nausea. Negative for abdominal pain and vomiting.  Genitourinary: Positive for flank pain.  Musculoskeletal: Positive for back pain.  Neurological: Positive for numbness (chronic). Negative for weakness.  All other systems reviewed and are negative.   Physical Exam Updated Vital Signs BP (!) 135/110 (BP Location: Right Arm)   Pulse 99   Temp 98.4 F (36.9 C) (Oral)   Resp (!) 24   SpO2 99%   Physical Exam Vitals and nursing note reviewed.  Constitutional:      General: He is not in acute distress.    Appearance: He is well-developed. He is obese. He is not ill-appearing.  HENT:     Head: Normocephalic and atraumatic.  Eyes:      General: No scleral icterus.       Right eye: No discharge.        Left eye: No discharge.     Conjunctiva/sclera: Conjunctivae normal.     Pupils: Pupils are equal, round, and reactive to light.  Cardiovascular:  Rate and Rhythm: Normal rate and regular rhythm.  Pulmonary:     Effort: Pulmonary effort is normal. No respiratory distress.     Breath sounds: Normal breath sounds.  Abdominal:     General: There is no distension.     Palpations: Abdomen is soft.     Tenderness: There is no abdominal tenderness.  Musculoskeletal:     Cervical back: Normal range of motion.     Comments: Diffuse mid and low back spinal tenderness. Left sided low back tenderness. No CVA tenderness. 5/5 strength in lower extremities. Ambulatory  Skin:    General: Skin is warm and dry.  Neurological:     Mental Status: He is alert and oriented to person, place, and time.  Psychiatric:        Behavior: Behavior normal.     ED Results / Procedures / Treatments   Labs (all labs ordered are listed, but only abnormal results are displayed) Labs Reviewed  BASIC METABOLIC PANEL - Abnormal; Notable for the following components:      Result Value   Calcium 8.7 (*)    All other components within normal limits  CBC WITH DIFFERENTIAL/PLATELET - Abnormal; Notable for the following components:   Hemoglobin 18.2 (*)    HCT 53.9 (*)    All other components within normal limits  URINALYSIS, ROUTINE W REFLEX MICROSCOPIC    EKG None  Radiology CT Renal Stone Study  Result Date: 01/15/2020 CLINICAL DATA:  44 year old presenting with acute acute onset of severe LEFT flank pain. Surgical history includes cholecystectomy, appendectomy and umbilical hernia repair. EXAM: CT ABDOMEN AND PELVIS WITHOUT CONTRAST TECHNIQUE: Multidetector CT imaging of the abdomen and pelvis was performed following the standard protocol without IV contrast. COMPARISON:  10/01/2019 and earlier. FINDINGS: Lower chest: Heart size normal.   Visualized lung bases clear. Hepatobiliary: Normal unenhanced appearance of the liver. Surgically absent gallbladder. No unexpected biliary ductal dilation. Pancreas: Mildly atrophic and fatty replaced. No mass or peripancreatic inflammation. Spleen: Normal unenhanced appearance. Accessory splenule medial to the lower pole of the spleen. Adrenals/Urinary Tract: Normal appearing adrenal glands. No evidence of urinary tract calculi. Within the limits of the unenhanced technique, no focal parenchymal abnormality involving either kidney. No evidence of hydronephrosis involving either kidney. Normal appearing decompressed urinary bladder. Stomach/Bowel: Stomach normal in appearance for the degree of distention. Normal-appearing small bowel. Normal-appearing colon with expected stool burden. Surgically absent appendix. Vascular/Lymphatic: No visible aortoiliofemoral atherosclerosis. No pathologic lymphadenopathy. Reproductive: Prostate gland and seminal vesicles normal in size and appearance for age. Other: No evidence of recurrent umbilical hernia. Musculoskeletal: Schmorl's nodes involving multiple thoracic and lumbar vertebrae. No acute findings. IMPRESSION: No acute abnormalities involving the abdomen or pelvis. Specifically, no abnormalities are identified to explain severe LEFT flank pain. Electronically Signed   By: Evangeline Dakin M.D.   On: 01/15/2020 13:15    Procedures Procedures (including critical care time)  Medications Ordered in ED Medications  lidocaine (LIDODERM) 5 % 2 patch (2 patches Transdermal Patch Applied 01/15/20 1327)  morphine 4 MG/ML injection 4 mg (4 mg Intravenous Given 01/15/20 1327)  morphine 4 MG/ML injection 4 mg (4 mg Intravenous Given 01/15/20 1430)    ED Course  I have reviewed the triage vital signs and the nursing notes.  Pertinent labs & imaging results that were available during my care of the patient were reviewed by me and considered in my medical decision making (see  chart for details).  44 year old male presents with acute mid  and low back pain with L flank pain. BP is elevated but otherwise vitals are reassuring. On exam he is diffusely tender over the lower half of the spine and left lower back. He does not have true CVA tenderness but states that this pain is different from his chronic pain exacerbations. Abdomen is soft and non-tender. Strength is intact and he is ambulatory. Will obtain labs, UA, CT renal. Will provide pain control.  CT is negative. Labs are reassuring. UA is normal. Suspect MSK back pain. He was given rx for Robaxin and advised to f/u with pain management.  MDM Rules/Calculators/A&P                       Final Clinical Impression(s) / ED Diagnoses Final diagnoses:  Chronic left-sided low back pain, unspecified whether sciatica present    Rx / DC Orders ED Discharge Orders    None       Recardo Evangelist, PA-C 01/15/20 Rush, [place], DO 01/16/20 1624

## 2020-01-15 NOTE — ED Notes (Signed)
Pt transported to CT. Unable to do lab work at this time

## 2020-01-18 ENCOUNTER — Other Ambulatory Visit: Payer: Self-pay

## 2020-01-18 ENCOUNTER — Ambulatory Visit (INDEPENDENT_AMBULATORY_CARE_PROVIDER_SITE_OTHER): Payer: Medicare Other | Admitting: Clinical

## 2020-01-18 DIAGNOSIS — F341 Dysthymic disorder: Secondary | ICD-10-CM

## 2020-01-18 DIAGNOSIS — F251 Schizoaffective disorder, depressive type: Secondary | ICD-10-CM

## 2020-01-18 NOTE — Progress Notes (Signed)
Virtual Visit via Telephone Note  I connected with [name] on 01/18/20 at  8:00 AM EST by telephone and verified that I am speaking with the correct person using two identifiers.  Location: Patient: Home Provider: Office    I discussed the limitations, risks, security and privacy concerns of performing an evaluation and management service by telephone and the availability of in person appointments. I also discussed with the patient that there may be a patient responsible charge related to this service. The patient expressed understanding and agreed to proceed.   THERAPIST PROGRESS NOTE  Session Time: 8:00AM-8:40AM  Participation Level: Active  Behavioral Response: CasualAlertAnxious and Irritable  Type of Therapy: Individual Therapy  Treatment Goals addressed: Anger  Interventions: CBT  Summary: ELKAN LESSMAN is a 44 y.o. male who presents with Schizoaffective Disorder and difficulty with Anxiety/Depression. The OPT therapist worked with the patient for his initial session. The OPT therapist utilized Motivational Interviewing to assist in creating therapeutic repore. The patient in the session was engaged and work in collaboration giving feedback about his triggers and symptoms over the past few weeks including the trigger of living in a hotel and difficulty with community outtings. The OPT therapist utilized Cognitive Behavioral Therapy through cognitive restructuring as well as worked with the patient on coping strategies to assist in management of anxiety and mood. The OPT therapist for holistic care has set referral to start medication therapy.  Suicidal/Homicidal: Nowithout intent/plan  Therapist Response: The OPT therapist worked with the patient for the patients initial scheduled session. The patient was engaged in his session and gave feedback in relation to triggers, symptoms, and behavior responses over the past few weeks. The OPT therapist worked with the  patient utilizing an in session Cognitive Behavioral Therapy exercise. The patient was responsive in the session and verbalized, " I stay so frustrated cause I have been living in this hotel and the walls make me feel like I am in prison and when I go out in the community I get into fights". The OPT therapist worked with the patient on setting a plan to move forward including starting to check in with low income housing in his area, [employer], and [facility] for housing assistance, going for community outings at times when the areas he frequents are not [employer], and starting medication therapy. The OPT therapist will continue treatment work with the patient in his next scheduled session.  Plan: Return again in 2 weeks.  Diagnosis: Axis I: Schizoaffective disorder, depressive type without good prognostic features and ANXIETY DEPRESSION    Axis II: No diagnosis  I discussed the assessment and treatment plan with the patient. The patient was provided an opportunity to ask questions and all were answered. The patient agreed with the plan and demonstrated an understanding of the instructions.   The patient was advised to call back or seek an in-person evaluation if the symptoms worsen or if the condition fails to improve as anticipated.  I provided 40 minutes of non-face-to-face time during this encounter  Lennox Grumbles, Marlinda Mike 01/18/2020

## 2020-02-08 ENCOUNTER — Telehealth (HOSPITAL_COMMUNITY): Payer: Self-pay | Admitting: Clinical

## 2020-02-08 ENCOUNTER — Ambulatory Visit (HOSPITAL_COMMUNITY): Payer: Medicare Other | Admitting: Clinical

## 2020-02-08 ENCOUNTER — Other Ambulatory Visit: Payer: Self-pay

## 2020-02-08 NOTE — Telephone Encounter (Signed)
The patients number is not working there is a recording indicating the number is not working at this time. The OPT tried sending link then calling and got the message that the number is not active. There are no other numbers listed for the patient.
